# Patient Record
Sex: Female | Born: 1950 | Race: White | Hispanic: No | Smoking: Never smoker
Health system: Southern US, Community
[De-identification: ages and names within clinical notes are randomized; demographics above are authoritative.]

## PROBLEM LIST (undated history)

## (undated) DIAGNOSIS — Z973 Presence of spectacles and contact lenses: Secondary | ICD-10-CM

## (undated) DIAGNOSIS — B1920 Unspecified viral hepatitis C without hepatic coma: Secondary | ICD-10-CM

## (undated) DIAGNOSIS — R161 Splenomegaly, not elsewhere classified: Secondary | ICD-10-CM

## (undated) DIAGNOSIS — J40 Bronchitis, not specified as acute or chronic: Secondary | ICD-10-CM

## (undated) DIAGNOSIS — R202 Paresthesia of skin: Secondary | ICD-10-CM

## (undated) DIAGNOSIS — I10 Essential (primary) hypertension: Secondary | ICD-10-CM

## (undated) DIAGNOSIS — I493 Ventricular premature depolarization: Secondary | ICD-10-CM

## (undated) DIAGNOSIS — R200 Anesthesia of skin: Secondary | ICD-10-CM

## (undated) DIAGNOSIS — G4700 Insomnia, unspecified: Secondary | ICD-10-CM

## (undated) DIAGNOSIS — G43909 Migraine, unspecified, not intractable, without status migrainosus: Secondary | ICD-10-CM

## (undated) DIAGNOSIS — G4730 Sleep apnea, unspecified: Secondary | ICD-10-CM

## (undated) DIAGNOSIS — K219 Gastro-esophageal reflux disease without esophagitis: Secondary | ICD-10-CM

## (undated) DIAGNOSIS — F329 Major depressive disorder, single episode, unspecified: Secondary | ICD-10-CM

## (undated) DIAGNOSIS — J45909 Unspecified asthma, uncomplicated: Secondary | ICD-10-CM

## (undated) DIAGNOSIS — F32A Depression, unspecified: Secondary | ICD-10-CM

## (undated) DIAGNOSIS — Z8701 Personal history of pneumonia (recurrent): Secondary | ICD-10-CM

## (undated) DIAGNOSIS — IMO0001 Reserved for inherently not codable concepts without codable children: Secondary | ICD-10-CM

## (undated) DIAGNOSIS — K7581 Nonalcoholic steatohepatitis (NASH): Secondary | ICD-10-CM

## (undated) DIAGNOSIS — E079 Disorder of thyroid, unspecified: Secondary | ICD-10-CM

## (undated) DIAGNOSIS — F419 Anxiety disorder, unspecified: Secondary | ICD-10-CM

## (undated) DIAGNOSIS — M1990 Unspecified osteoarthritis, unspecified site: Secondary | ICD-10-CM

## (undated) DIAGNOSIS — K5792 Diverticulitis of intestine, part unspecified, without perforation or abscess without bleeding: Secondary | ICD-10-CM

## (undated) DIAGNOSIS — J449 Chronic obstructive pulmonary disease, unspecified: Secondary | ICD-10-CM

## (undated) HISTORY — PX: KNEE SURGERY: SHX244

## (undated) HISTORY — PX: ANKLE SURGERY: SHX546

## (undated) HISTORY — DX: Migraine, unspecified, not intractable, without status migrainosus: G43.909

## (undated) HISTORY — PX: COLONOSCOPY: SHX174

## (undated) HISTORY — PX: COLON SURGERY: SHX602

## (undated) HISTORY — DX: Essential (primary) hypertension: I10

## (undated) HISTORY — DX: Unspecified osteoarthritis, unspecified site: M19.90

## (undated) HISTORY — PX: ABDOMINAL HYSTERECTOMY: SHX81

## (undated) HISTORY — DX: Depression, unspecified: F32.A

## (undated) HISTORY — DX: Nonalcoholic steatohepatitis (NASH): K75.81

## (undated) HISTORY — DX: Gastro-esophageal reflux disease without esophagitis: K21.9

## (undated) HISTORY — DX: Disorder of thyroid, unspecified: E07.9

## (undated) HISTORY — DX: Anxiety disorder, unspecified: F41.9

## (undated) HISTORY — DX: Chronic obstructive pulmonary disease, unspecified: J44.9

## (undated) HISTORY — DX: Unspecified asthma, uncomplicated: J45.909

## (undated) HISTORY — PX: ESOPHAGOGASTRODUODENOSCOPY: SHX1529

## (undated) HISTORY — DX: Major depressive disorder, single episode, unspecified: F32.9

## (undated) HISTORY — DX: Ventricular premature depolarization: I49.3

## (undated) HISTORY — DX: Unspecified viral hepatitis C without hepatic coma: B19.20

## (undated) HISTORY — DX: Diverticulitis of intestine, part unspecified, without perforation or abscess without bleeding: K57.92

## (undated) HISTORY — DX: Splenomegaly, not elsewhere classified: R16.1

## (undated) HISTORY — PX: JOINT REPLACEMENT: SHX530

---

## 2003-08-24 ENCOUNTER — Ambulatory Visit (HOSPITAL_COMMUNITY): Admission: RE | Admit: 2003-08-24 | Discharge: 2003-08-24 | Payer: Self-pay | Admitting: Family Medicine

## 2003-08-28 ENCOUNTER — Encounter: Admission: RE | Admit: 2003-08-28 | Discharge: 2003-08-28 | Payer: Self-pay | Admitting: Oncology

## 2003-08-28 ENCOUNTER — Encounter (HOSPITAL_COMMUNITY): Admission: RE | Admit: 2003-08-28 | Discharge: 2003-09-27 | Payer: Self-pay | Admitting: Oncology

## 2003-08-29 ENCOUNTER — Encounter (HOSPITAL_COMMUNITY): Payer: Self-pay | Admitting: Oncology

## 2003-10-03 ENCOUNTER — Encounter (HOSPITAL_COMMUNITY): Admission: RE | Admit: 2003-10-03 | Discharge: 2003-11-02 | Payer: Self-pay | Admitting: Oncology

## 2003-10-03 ENCOUNTER — Encounter: Admission: RE | Admit: 2003-10-03 | Discharge: 2003-10-03 | Payer: Self-pay | Admitting: Oncology

## 2003-11-07 ENCOUNTER — Encounter (HOSPITAL_COMMUNITY): Admission: RE | Admit: 2003-11-07 | Discharge: 2003-12-07 | Payer: Self-pay | Admitting: Oncology

## 2003-11-07 ENCOUNTER — Encounter: Admission: RE | Admit: 2003-11-07 | Discharge: 2003-11-07 | Payer: Self-pay | Admitting: Oncology

## 2003-12-12 ENCOUNTER — Encounter (HOSPITAL_COMMUNITY): Admission: RE | Admit: 2003-12-12 | Discharge: 2003-12-15 | Payer: Self-pay | Admitting: Oncology

## 2003-12-12 ENCOUNTER — Encounter: Admission: RE | Admit: 2003-12-12 | Discharge: 2003-12-15 | Payer: Self-pay | Admitting: Oncology

## 2003-12-18 ENCOUNTER — Encounter (INDEPENDENT_AMBULATORY_CARE_PROVIDER_SITE_OTHER): Payer: Self-pay | Admitting: *Deleted

## 2003-12-18 ENCOUNTER — Ambulatory Visit (HOSPITAL_COMMUNITY): Admission: RE | Admit: 2003-12-18 | Discharge: 2003-12-18 | Payer: Self-pay | Admitting: Internal Medicine

## 2003-12-19 ENCOUNTER — Encounter: Admission: RE | Admit: 2003-12-19 | Discharge: 2003-12-19 | Payer: Self-pay | Admitting: Oncology

## 2003-12-19 ENCOUNTER — Encounter (HOSPITAL_COMMUNITY): Admission: RE | Admit: 2003-12-19 | Discharge: 2004-01-18 | Payer: Self-pay | Admitting: Oncology

## 2003-12-26 ENCOUNTER — Emergency Department (HOSPITAL_COMMUNITY): Admission: EM | Admit: 2003-12-26 | Discharge: 2003-12-26 | Payer: Self-pay | Admitting: Emergency Medicine

## 2004-01-13 ENCOUNTER — Emergency Department (HOSPITAL_COMMUNITY): Admission: EM | Admit: 2004-01-13 | Discharge: 2004-01-13 | Payer: Self-pay | Admitting: Emergency Medicine

## 2004-01-17 ENCOUNTER — Ambulatory Visit: Payer: Self-pay | Admitting: Internal Medicine

## 2004-01-23 ENCOUNTER — Ambulatory Visit (HOSPITAL_COMMUNITY): Payer: Self-pay | Admitting: Oncology

## 2004-01-23 ENCOUNTER — Encounter: Admission: RE | Admit: 2004-01-23 | Discharge: 2004-01-23 | Payer: Self-pay | Admitting: Oncology

## 2004-01-23 ENCOUNTER — Encounter (HOSPITAL_COMMUNITY): Admission: RE | Admit: 2004-01-23 | Discharge: 2004-02-22 | Payer: Self-pay | Admitting: Oncology

## 2004-02-13 ENCOUNTER — Ambulatory Visit: Payer: Self-pay | Admitting: Gastroenterology

## 2004-03-04 ENCOUNTER — Encounter: Admission: RE | Admit: 2004-03-04 | Discharge: 2004-03-04 | Payer: Self-pay | Admitting: Oncology

## 2004-03-04 ENCOUNTER — Encounter (HOSPITAL_COMMUNITY): Admission: RE | Admit: 2004-03-04 | Discharge: 2004-04-03 | Payer: Self-pay | Admitting: Oncology

## 2004-03-05 ENCOUNTER — Ambulatory Visit: Payer: Self-pay | Admitting: Gastroenterology

## 2004-06-10 ENCOUNTER — Ambulatory Visit: Payer: Self-pay | Admitting: Gastroenterology

## 2004-06-14 ENCOUNTER — Ambulatory Visit (HOSPITAL_COMMUNITY): Payer: Self-pay | Admitting: Oncology

## 2004-06-14 ENCOUNTER — Encounter: Admission: RE | Admit: 2004-06-14 | Discharge: 2004-06-14 | Payer: Self-pay | Admitting: Oncology

## 2004-06-14 ENCOUNTER — Encounter (HOSPITAL_COMMUNITY): Admission: RE | Admit: 2004-06-14 | Discharge: 2004-07-14 | Payer: Self-pay | Admitting: Oncology

## 2004-07-04 ENCOUNTER — Ambulatory Visit: Payer: Self-pay | Admitting: Gastroenterology

## 2004-07-18 ENCOUNTER — Ambulatory Visit: Payer: Self-pay | Admitting: Internal Medicine

## 2004-08-01 ENCOUNTER — Ambulatory Visit: Payer: Self-pay | Admitting: Internal Medicine

## 2004-08-12 ENCOUNTER — Ambulatory Visit (HOSPITAL_COMMUNITY): Payer: Self-pay | Admitting: Oncology

## 2004-08-12 ENCOUNTER — Encounter (HOSPITAL_COMMUNITY): Admission: RE | Admit: 2004-08-12 | Discharge: 2004-09-11 | Payer: Self-pay | Admitting: Oncology

## 2004-08-12 ENCOUNTER — Encounter: Admission: RE | Admit: 2004-08-12 | Discharge: 2004-08-12 | Payer: Self-pay | Admitting: Oncology

## 2004-08-29 ENCOUNTER — Ambulatory Visit: Payer: Self-pay | Admitting: Internal Medicine

## 2004-09-11 ENCOUNTER — Ambulatory Visit: Payer: Self-pay | Admitting: Cardiology

## 2004-09-26 ENCOUNTER — Ambulatory Visit: Payer: Self-pay | Admitting: Internal Medicine

## 2004-09-26 ENCOUNTER — Encounter: Admission: RE | Admit: 2004-09-26 | Discharge: 2004-09-26 | Payer: Self-pay | Admitting: Internal Medicine

## 2004-10-08 ENCOUNTER — Ambulatory Visit: Payer: Self-pay | Admitting: Internal Medicine

## 2004-10-08 ENCOUNTER — Inpatient Hospital Stay (HOSPITAL_COMMUNITY): Admission: AD | Admit: 2004-10-08 | Discharge: 2004-10-10 | Payer: Self-pay | Admitting: Internal Medicine

## 2004-10-09 ENCOUNTER — Ambulatory Visit: Payer: Self-pay | Admitting: Cardiovascular Disease

## 2004-10-09 ENCOUNTER — Encounter: Payer: Self-pay | Admitting: Cardiovascular Disease

## 2004-10-17 ENCOUNTER — Ambulatory Visit: Payer: Self-pay | Admitting: Internal Medicine

## 2004-10-21 ENCOUNTER — Ambulatory Visit: Payer: Self-pay | Admitting: Internal Medicine

## 2004-10-22 ENCOUNTER — Ambulatory Visit: Payer: Self-pay | Admitting: Internal Medicine

## 2004-10-22 ENCOUNTER — Ambulatory Visit (HOSPITAL_COMMUNITY): Admission: RE | Admit: 2004-10-22 | Discharge: 2004-10-22 | Payer: Self-pay | Admitting: Internal Medicine

## 2004-10-29 ENCOUNTER — Ambulatory Visit: Payer: Self-pay | Admitting: Internal Medicine

## 2004-10-29 ENCOUNTER — Inpatient Hospital Stay (HOSPITAL_COMMUNITY): Admission: EM | Admit: 2004-10-29 | Discharge: 2004-11-01 | Payer: Self-pay | Admitting: Emergency Medicine

## 2004-11-12 ENCOUNTER — Ambulatory Visit (HOSPITAL_COMMUNITY): Admission: RE | Admit: 2004-11-12 | Discharge: 2004-11-12 | Payer: Self-pay | Admitting: Family Medicine

## 2004-11-14 ENCOUNTER — Ambulatory Visit: Payer: Self-pay | Admitting: Internal Medicine

## 2004-11-28 ENCOUNTER — Ambulatory Visit: Payer: Self-pay | Admitting: Gastroenterology

## 2004-12-10 ENCOUNTER — Ambulatory Visit: Payer: Self-pay | Admitting: Internal Medicine

## 2004-12-19 ENCOUNTER — Ambulatory Visit: Payer: Self-pay | Admitting: Internal Medicine

## 2004-12-19 ENCOUNTER — Emergency Department (HOSPITAL_COMMUNITY): Admission: EM | Admit: 2004-12-19 | Discharge: 2004-12-19 | Payer: Self-pay | Admitting: Emergency Medicine

## 2004-12-26 ENCOUNTER — Ambulatory Visit: Payer: Self-pay | Admitting: Internal Medicine

## 2005-01-24 ENCOUNTER — Ambulatory Visit: Payer: Self-pay | Admitting: Internal Medicine

## 2005-02-03 ENCOUNTER — Ambulatory Visit: Payer: Self-pay | Admitting: Internal Medicine

## 2005-02-07 ENCOUNTER — Ambulatory Visit (HOSPITAL_COMMUNITY): Admission: RE | Admit: 2005-02-07 | Discharge: 2005-02-07 | Payer: Self-pay | Admitting: Internal Medicine

## 2005-02-10 ENCOUNTER — Ambulatory Visit (HOSPITAL_COMMUNITY): Payer: Self-pay | Admitting: Oncology

## 2005-02-10 ENCOUNTER — Encounter (HOSPITAL_COMMUNITY): Admission: RE | Admit: 2005-02-10 | Discharge: 2005-03-12 | Payer: Self-pay | Admitting: Oncology

## 2005-02-10 ENCOUNTER — Encounter: Admission: RE | Admit: 2005-02-10 | Discharge: 2005-02-10 | Payer: Self-pay | Admitting: Oncology

## 2005-02-11 ENCOUNTER — Ambulatory Visit: Payer: Self-pay | Admitting: Internal Medicine

## 2005-03-26 ENCOUNTER — Ambulatory Visit: Payer: Self-pay | Admitting: Internal Medicine

## 2005-04-07 ENCOUNTER — Ambulatory Visit: Payer: Self-pay | Admitting: Internal Medicine

## 2005-05-14 ENCOUNTER — Ambulatory Visit: Payer: Self-pay | Admitting: Internal Medicine

## 2005-05-20 ENCOUNTER — Ambulatory Visit (HOSPITAL_COMMUNITY): Admission: RE | Admit: 2005-05-20 | Discharge: 2005-05-20 | Payer: Self-pay | Admitting: Internal Medicine

## 2005-06-13 ENCOUNTER — Ambulatory Visit: Payer: Self-pay | Admitting: Internal Medicine

## 2005-06-25 ENCOUNTER — Ambulatory Visit: Payer: Self-pay | Admitting: Internal Medicine

## 2005-07-04 ENCOUNTER — Ambulatory Visit (HOSPITAL_COMMUNITY): Admission: RE | Admit: 2005-07-04 | Discharge: 2005-07-04 | Payer: Self-pay | Admitting: Family Medicine

## 2005-08-21 ENCOUNTER — Ambulatory Visit: Payer: Self-pay | Admitting: Gastroenterology

## 2005-08-27 ENCOUNTER — Ambulatory Visit: Payer: Self-pay | Admitting: Internal Medicine

## 2005-10-22 ENCOUNTER — Inpatient Hospital Stay (HOSPITAL_COMMUNITY): Admission: RE | Admit: 2005-10-22 | Discharge: 2005-10-25 | Payer: Self-pay | Admitting: Orthopedic Surgery

## 2005-10-23 ENCOUNTER — Ambulatory Visit: Payer: Self-pay | Admitting: Physical Medicine & Rehabilitation

## 2005-11-08 ENCOUNTER — Inpatient Hospital Stay (HOSPITAL_COMMUNITY): Admission: EM | Admit: 2005-11-08 | Discharge: 2005-11-12 | Payer: Self-pay | Admitting: Emergency Medicine

## 2005-11-09 ENCOUNTER — Ambulatory Visit: Payer: Self-pay | Admitting: Infectious Diseases

## 2005-12-03 ENCOUNTER — Ambulatory Visit: Payer: Self-pay | Admitting: Hospitalist

## 2006-01-13 ENCOUNTER — Ambulatory Visit (HOSPITAL_COMMUNITY): Admission: RE | Admit: 2006-01-13 | Discharge: 2006-01-13 | Payer: Self-pay | Admitting: Internal Medicine

## 2006-01-13 ENCOUNTER — Inpatient Hospital Stay (HOSPITAL_COMMUNITY): Admission: AD | Admit: 2006-01-13 | Discharge: 2006-01-15 | Payer: Self-pay | Admitting: Hospitalist

## 2006-01-13 ENCOUNTER — Ambulatory Visit: Payer: Self-pay | Admitting: Internal Medicine

## 2006-01-13 ENCOUNTER — Ambulatory Visit: Payer: Self-pay | Admitting: Hospitalist

## 2006-01-28 ENCOUNTER — Ambulatory Visit: Payer: Self-pay | Admitting: Hospitalist

## 2006-02-19 ENCOUNTER — Ambulatory Visit: Payer: Self-pay | Admitting: Internal Medicine

## 2006-02-19 ENCOUNTER — Encounter: Payer: Self-pay | Admitting: Vascular Surgery

## 2006-02-19 ENCOUNTER — Encounter (INDEPENDENT_AMBULATORY_CARE_PROVIDER_SITE_OTHER): Payer: Self-pay | Admitting: Internal Medicine

## 2006-02-19 ENCOUNTER — Ambulatory Visit (HOSPITAL_COMMUNITY): Admission: RE | Admit: 2006-02-19 | Discharge: 2006-02-19 | Payer: Self-pay | Admitting: Internal Medicine

## 2006-02-19 LAB — CONVERTED CEMR LAB
Basophils Relative: 0 % (ref 0–1)
Lymphocytes Relative: 27 % (ref 15–43)
MCV: 90.3 fL (ref 78.8–100.0)
Monocytes Absolute: 0.4 10*3/uL (ref 0.2–0.7)
Neutrophils Relative %: 62 % (ref 47–77)
WBC: 4.8 10*3/uL (ref 3.7–10.0)

## 2006-03-18 ENCOUNTER — Ambulatory Visit (HOSPITAL_BASED_OUTPATIENT_CLINIC_OR_DEPARTMENT_OTHER): Admission: RE | Admit: 2006-03-18 | Discharge: 2006-03-18 | Payer: Self-pay | Admitting: Orthopedic Surgery

## 2006-03-24 ENCOUNTER — Ambulatory Visit: Payer: Self-pay | Admitting: Internal Medicine

## 2006-03-24 ENCOUNTER — Ambulatory Visit: Payer: Self-pay | Admitting: *Deleted

## 2006-03-24 ENCOUNTER — Inpatient Hospital Stay (HOSPITAL_COMMUNITY): Admission: AD | Admit: 2006-03-24 | Discharge: 2006-04-01 | Payer: Self-pay | Admitting: *Deleted

## 2006-03-24 DIAGNOSIS — M8610 Other acute osteomyelitis, unspecified site: Secondary | ICD-10-CM | POA: Insufficient documentation

## 2006-03-25 ENCOUNTER — Encounter: Payer: Self-pay | Admitting: Vascular Surgery

## 2006-04-08 DIAGNOSIS — F329 Major depressive disorder, single episode, unspecified: Secondary | ICD-10-CM

## 2006-04-08 DIAGNOSIS — D649 Anemia, unspecified: Secondary | ICD-10-CM | POA: Insufficient documentation

## 2006-04-08 DIAGNOSIS — B182 Chronic viral hepatitis C: Secondary | ICD-10-CM | POA: Insufficient documentation

## 2006-04-08 DIAGNOSIS — I10 Essential (primary) hypertension: Secondary | ICD-10-CM | POA: Insufficient documentation

## 2006-04-08 DIAGNOSIS — J45909 Unspecified asthma, uncomplicated: Secondary | ICD-10-CM | POA: Insufficient documentation

## 2006-04-08 DIAGNOSIS — M1710 Unilateral primary osteoarthritis, unspecified knee: Secondary | ICD-10-CM

## 2006-04-08 DIAGNOSIS — K219 Gastro-esophageal reflux disease without esophagitis: Secondary | ICD-10-CM

## 2006-04-08 DIAGNOSIS — D693 Immune thrombocytopenic purpura: Secondary | ICD-10-CM

## 2006-04-08 DIAGNOSIS — F3289 Other specified depressive episodes: Secondary | ICD-10-CM | POA: Insufficient documentation

## 2006-04-08 DIAGNOSIS — F411 Generalized anxiety disorder: Secondary | ICD-10-CM | POA: Insufficient documentation

## 2006-04-09 ENCOUNTER — Encounter (INDEPENDENT_AMBULATORY_CARE_PROVIDER_SITE_OTHER): Payer: Self-pay | Admitting: Internal Medicine

## 2006-04-09 ENCOUNTER — Ambulatory Visit: Payer: Self-pay | Admitting: Internal Medicine

## 2006-04-09 DIAGNOSIS — R740 Nonspecific elevation of levels of transaminase and lactic acid dehydrogenase [LDH]: Secondary | ICD-10-CM

## 2006-04-09 DIAGNOSIS — Z96659 Presence of unspecified artificial knee joint: Secondary | ICD-10-CM

## 2006-04-09 DIAGNOSIS — N189 Chronic kidney disease, unspecified: Secondary | ICD-10-CM | POA: Insufficient documentation

## 2006-04-09 LAB — CONVERTED CEMR LAB
ALT: 68 units/L — ABNORMAL HIGH (ref 0–35)
AST: 131 units/L — ABNORMAL HIGH (ref 0–37)
Alkaline Phosphatase: 133 units/L — ABNORMAL HIGH (ref 39–117)
BUN: 10 mg/dL (ref 6–23)
Basophils Absolute: 0 10*3/uL (ref 0.0–0.1)
Basophils Relative: 0 % (ref 0–1)
CO2: 26 meq/L (ref 19–32)
Calcium: 9.7 mg/dL (ref 8.4–10.5)
Chloride: 102 meq/L (ref 96–112)
Eosinophils Absolute: 0.2 10*3/uL (ref 0.0–0.7)
Glucose, Bld: 98 mg/dL (ref 70–99)
Hemoglobin: 11.7 g/dL — ABNORMAL LOW (ref 12.0–15.0)
Lymphocytes Relative: 21 % (ref 12–46)
Lymphs Abs: 1.4 10*3/uL (ref 0.7–3.3)
MCHC: 33.4 g/dL (ref 30.0–36.0)
MCV: 88.7 fL (ref 78.0–100.0)
Neutrophils Relative %: 68 % (ref 43–77)
Potassium: 4.9 meq/L (ref 3.5–5.3)
Sodium: 135 meq/L (ref 135–145)
Total Protein: 7.6 g/dL (ref 6.0–8.3)
WBC: 6.7 10*3/uL (ref 4.0–10.5)

## 2006-04-16 ENCOUNTER — Ambulatory Visit: Payer: Self-pay | Admitting: Internal Medicine

## 2006-04-16 ENCOUNTER — Encounter (INDEPENDENT_AMBULATORY_CARE_PROVIDER_SITE_OTHER): Payer: Self-pay | Admitting: Internal Medicine

## 2006-04-16 DIAGNOSIS — L2089 Other atopic dermatitis: Secondary | ICD-10-CM

## 2006-04-16 DIAGNOSIS — G4700 Insomnia, unspecified: Secondary | ICD-10-CM

## 2006-04-17 ENCOUNTER — Telehealth (INDEPENDENT_AMBULATORY_CARE_PROVIDER_SITE_OTHER): Payer: Self-pay | Admitting: Internal Medicine

## 2006-04-17 LAB — CONVERTED CEMR LAB
ALT: 49 U/L — ABNORMAL HIGH
AST: 68 U/L — ABNORMAL HIGH
Albumin: 3.8 g/dL
Alkaline Phosphatase: 116 U/L
BUN: 11 mg/dL
CO2: 20 meq/L
Calcium: 9.1 mg/dL
Chloride: 104 meq/L
Creatinine, Ser: 1.24 mg/dL — ABNORMAL HIGH
Glucose, Bld: 97 mg/dL
Magnesium: 1.9 mg/dL
Potassium: 3.8 meq/L
Sodium: 137 meq/L
Total Bilirubin: 0.5 mg/dL
Total Protein: 7.1 g/dL

## 2006-04-23 ENCOUNTER — Ambulatory Visit: Payer: Self-pay | Admitting: Internal Medicine

## 2006-04-28 ENCOUNTER — Encounter (INDEPENDENT_AMBULATORY_CARE_PROVIDER_SITE_OTHER): Payer: Self-pay | Admitting: Internal Medicine

## 2006-05-01 ENCOUNTER — Telehealth (INDEPENDENT_AMBULATORY_CARE_PROVIDER_SITE_OTHER): Payer: Self-pay | Admitting: Hospitalist

## 2006-05-04 ENCOUNTER — Telehealth (INDEPENDENT_AMBULATORY_CARE_PROVIDER_SITE_OTHER): Payer: Self-pay | Admitting: *Deleted

## 2006-05-05 ENCOUNTER — Telehealth (INDEPENDENT_AMBULATORY_CARE_PROVIDER_SITE_OTHER): Payer: Self-pay | Admitting: Hospitalist

## 2006-05-06 ENCOUNTER — Inpatient Hospital Stay (HOSPITAL_COMMUNITY): Admission: RE | Admit: 2006-05-06 | Discharge: 2006-05-09 | Payer: Self-pay | Admitting: Orthopedic Surgery

## 2006-05-07 ENCOUNTER — Encounter (INDEPENDENT_AMBULATORY_CARE_PROVIDER_SITE_OTHER): Payer: Self-pay | Admitting: Internal Medicine

## 2006-05-17 ENCOUNTER — Emergency Department (HOSPITAL_COMMUNITY): Admission: EM | Admit: 2006-05-17 | Discharge: 2006-05-17 | Payer: Self-pay | Admitting: Emergency Medicine

## 2006-06-24 ENCOUNTER — Encounter (INDEPENDENT_AMBULATORY_CARE_PROVIDER_SITE_OTHER): Payer: Self-pay | Admitting: Internal Medicine

## 2006-06-24 ENCOUNTER — Ambulatory Visit: Payer: Self-pay | Admitting: Internal Medicine

## 2006-06-25 ENCOUNTER — Telehealth: Payer: Self-pay | Admitting: *Deleted

## 2006-06-29 LAB — CONVERTED CEMR LAB
BUN: 26 mg/dL — ABNORMAL HIGH (ref 6–23)
Chloride: 106 meq/L (ref 96–112)
HCT: 36.1 % (ref 36.0–46.0)
MCHC: 32.7 g/dL (ref 30.0–36.0)
Platelets: 144 10*3/uL — ABNORMAL LOW (ref 150–400)
Sodium: 142 meq/L (ref 135–145)

## 2006-07-06 ENCOUNTER — Ambulatory Visit: Payer: Self-pay | Admitting: Hospitalist

## 2006-07-16 ENCOUNTER — Ambulatory Visit: Payer: Self-pay | Admitting: Internal Medicine

## 2006-08-20 ENCOUNTER — Telehealth (INDEPENDENT_AMBULATORY_CARE_PROVIDER_SITE_OTHER): Payer: Self-pay | Admitting: *Deleted

## 2006-08-20 ENCOUNTER — Encounter (INDEPENDENT_AMBULATORY_CARE_PROVIDER_SITE_OTHER): Payer: Self-pay | Admitting: Unknown Physician Specialty

## 2006-08-20 ENCOUNTER — Ambulatory Visit: Payer: Self-pay | Admitting: Internal Medicine

## 2006-08-20 DIAGNOSIS — J209 Acute bronchitis, unspecified: Secondary | ICD-10-CM | POA: Insufficient documentation

## 2006-08-24 ENCOUNTER — Ambulatory Visit: Payer: Self-pay | Admitting: Internal Medicine

## 2006-09-03 ENCOUNTER — Encounter: Payer: Self-pay | Admitting: Internal Medicine

## 2006-09-03 ENCOUNTER — Ambulatory Visit (HOSPITAL_COMMUNITY): Admission: RE | Admit: 2006-09-03 | Discharge: 2006-09-03 | Payer: Self-pay | Admitting: Internal Medicine

## 2006-09-03 ENCOUNTER — Encounter (INDEPENDENT_AMBULATORY_CARE_PROVIDER_SITE_OTHER): Payer: Self-pay | Admitting: Internal Medicine

## 2006-09-03 ENCOUNTER — Ambulatory Visit: Payer: Self-pay | Admitting: Internal Medicine

## 2006-11-02 ENCOUNTER — Ambulatory Visit: Payer: Self-pay | Admitting: *Deleted

## 2006-11-03 ENCOUNTER — Encounter (INDEPENDENT_AMBULATORY_CARE_PROVIDER_SITE_OTHER): Payer: Self-pay | Admitting: Internal Medicine

## 2006-11-04 LAB — CONVERTED CEMR LAB
AST: 52 units/L — ABNORMAL HIGH (ref 0–37)
CO2: 23 meq/L (ref 19–32)
Chloride: 106 meq/L (ref 96–112)
Creatinine, Ser: 0.89 mg/dL (ref 0.40–1.20)
Eosinophils Absolute: 0.1 10*3/uL (ref 0.0–0.7)
Lymphocytes Relative: 29 % (ref 12–46)
MCHC: 34.4 g/dL (ref 30.0–36.0)
Monocytes Relative: 9 % (ref 3–11)
Neutro Abs: 3.5 10*3/uL (ref 1.7–7.7)
Neutrophils Relative %: 59 % (ref 43–77)
Platelets: 134 10*3/uL — ABNORMAL LOW (ref 150–400)
Potassium: 4.2 meq/L (ref 3.5–5.3)
RBC: 3.97 M/uL (ref 3.87–5.11)
RDW: 14.6 % — ABNORMAL HIGH (ref 11.5–14.0)
Sodium: 141 meq/L (ref 135–145)
TSH: 1.782 microintl units/mL (ref 0.350–5.50)
Total Protein: 7.8 g/dL (ref 6.0–8.3)
WBC: 5.8 10*3/uL (ref 4.0–10.5)

## 2007-01-18 ENCOUNTER — Ambulatory Visit: Payer: Self-pay | Admitting: Hospitalist

## 2007-01-18 DIAGNOSIS — M129 Arthropathy, unspecified: Secondary | ICD-10-CM | POA: Insufficient documentation

## 2007-02-02 ENCOUNTER — Ambulatory Visit: Payer: Self-pay | Admitting: *Deleted

## 2007-02-25 ENCOUNTER — Ambulatory Visit (HOSPITAL_COMMUNITY): Admission: RE | Admit: 2007-02-25 | Discharge: 2007-02-25 | Payer: Self-pay | Admitting: Infectious Diseases

## 2007-02-25 ENCOUNTER — Ambulatory Visit: Payer: Self-pay | Admitting: Infectious Diseases

## 2007-02-25 ENCOUNTER — Encounter (INDEPENDENT_AMBULATORY_CARE_PROVIDER_SITE_OTHER): Payer: Self-pay | Admitting: Internal Medicine

## 2007-02-25 DIAGNOSIS — R079 Chest pain, unspecified: Secondary | ICD-10-CM | POA: Insufficient documentation

## 2007-02-25 DIAGNOSIS — M25569 Pain in unspecified knee: Secondary | ICD-10-CM | POA: Insufficient documentation

## 2007-02-26 LAB — CONVERTED CEMR LAB
Creatinine,U: 168.9 mg/dL
Marijuana Metabolite: NEGATIVE
Methadone: NEGATIVE

## 2007-04-02 ENCOUNTER — Ambulatory Visit: Payer: Self-pay | Admitting: Internal Medicine

## 2007-04-02 ENCOUNTER — Encounter (INDEPENDENT_AMBULATORY_CARE_PROVIDER_SITE_OTHER): Payer: Self-pay | Admitting: Internal Medicine

## 2007-04-02 LAB — CONVERTED CEMR LAB
Cocaine Metabolites: NEGATIVE
Creatinine,U: 190.2 mg/dL
Methadone: NEGATIVE
Propoxyphene: NEGATIVE

## 2007-04-09 ENCOUNTER — Encounter (INDEPENDENT_AMBULATORY_CARE_PROVIDER_SITE_OTHER): Payer: Self-pay | Admitting: Internal Medicine

## 2007-04-14 ENCOUNTER — Telehealth (INDEPENDENT_AMBULATORY_CARE_PROVIDER_SITE_OTHER): Payer: Self-pay | Admitting: Internal Medicine

## 2007-04-26 ENCOUNTER — Ambulatory Visit: Payer: Self-pay | Admitting: Internal Medicine

## 2007-04-26 DIAGNOSIS — S82899A Other fracture of unspecified lower leg, initial encounter for closed fracture: Secondary | ICD-10-CM | POA: Insufficient documentation

## 2007-05-26 ENCOUNTER — Ambulatory Visit: Payer: Self-pay | Admitting: Hospitalist

## 2007-05-26 ENCOUNTER — Encounter (INDEPENDENT_AMBULATORY_CARE_PROVIDER_SITE_OTHER): Payer: Self-pay | Admitting: Internal Medicine

## 2007-05-26 ENCOUNTER — Ambulatory Visit (HOSPITAL_COMMUNITY): Admission: RE | Admit: 2007-05-26 | Discharge: 2007-05-26 | Payer: Self-pay | Admitting: Internal Medicine

## 2007-05-26 DIAGNOSIS — J069 Acute upper respiratory infection, unspecified: Secondary | ICD-10-CM | POA: Insufficient documentation

## 2007-05-26 DIAGNOSIS — K120 Recurrent oral aphthae: Secondary | ICD-10-CM

## 2007-05-26 LAB — CONVERTED CEMR LAB
Basophils Relative: 0 % (ref 0–1)
Eosinophils Absolute: 0.1 10*3/uL (ref 0.0–0.7)
Lymphs Abs: 1.3 10*3/uL (ref 0.7–4.0)
MCHC: 34.2 g/dL (ref 30.0–36.0)
MCV: 96.5 fL (ref 78.0–100.0)
Monocytes Absolute: 0.7 10*3/uL (ref 0.1–1.0)
Monocytes Relative: 10 % (ref 3–12)
Neutro Abs: 4.5 10*3/uL (ref 1.7–7.7)
Platelets: 138 10*3/uL — ABNORMAL LOW (ref 150–400)
RBC: 4.52 M/uL (ref 3.87–5.11)
WBC: 6.6 10*3/uL (ref 4.0–10.5)

## 2007-06-21 ENCOUNTER — Ambulatory Visit: Payer: Self-pay | Admitting: Infectious Disease

## 2007-06-21 ENCOUNTER — Encounter (INDEPENDENT_AMBULATORY_CARE_PROVIDER_SITE_OTHER): Payer: Self-pay | Admitting: Internal Medicine

## 2007-06-26 LAB — CONVERTED CEMR LAB
Benzodiazepines.: POSITIVE — AB
Cocaine Metabolites: NEGATIVE
Opiates: NEGATIVE
Phencyclidine (PCP): NEGATIVE

## 2007-09-24 ENCOUNTER — Encounter (INDEPENDENT_AMBULATORY_CARE_PROVIDER_SITE_OTHER): Payer: Self-pay | Admitting: Internal Medicine

## 2007-09-24 ENCOUNTER — Ambulatory Visit: Payer: Self-pay | Admitting: *Deleted

## 2007-09-30 LAB — CONVERTED CEMR LAB
ALT: 61 units/L — ABNORMAL HIGH (ref 0–35)
AST: 66 units/L — ABNORMAL HIGH (ref 0–37)
Alkaline Phosphatase: 99 units/L (ref 39–117)
Amphetamine Screen, Ur: NEGATIVE
Chloride: 107 meq/L (ref 96–112)
Cocaine Metabolites: NEGATIVE
Creatinine,U: 175.1 mg/dL
Glucose, Bld: 102 mg/dL — ABNORMAL HIGH (ref 70–99)
Phencyclidine (PCP): NEGATIVE
Potassium: 4.2 meq/L (ref 3.5–5.3)
Propoxyphene: NEGATIVE

## 2007-10-28 ENCOUNTER — Ambulatory Visit: Payer: Self-pay | Admitting: *Deleted

## 2007-10-28 ENCOUNTER — Encounter (INDEPENDENT_AMBULATORY_CARE_PROVIDER_SITE_OTHER): Payer: Self-pay | Admitting: Internal Medicine

## 2007-10-28 DIAGNOSIS — R635 Abnormal weight gain: Secondary | ICD-10-CM

## 2007-10-29 ENCOUNTER — Encounter (INDEPENDENT_AMBULATORY_CARE_PROVIDER_SITE_OTHER): Payer: Self-pay | Admitting: Internal Medicine

## 2007-10-29 LAB — CONVERTED CEMR LAB
Benzodiazepines.: POSITIVE — AB
Creatinine,U: 55.4 mg/dL
Marijuana Metabolite: NEGATIVE
Methadone: NEGATIVE
TSH: 4.54 microintl units/mL — ABNORMAL HIGH (ref 0.350–4.50)

## 2007-11-01 LAB — CONVERTED CEMR LAB: T3 Uptake Ratio: 27.5 % (ref 22.5–37.0)

## 2007-11-19 ENCOUNTER — Ambulatory Visit (HOSPITAL_COMMUNITY): Admission: RE | Admit: 2007-11-19 | Discharge: 2007-11-19 | Payer: Self-pay | Admitting: Internal Medicine

## 2007-11-23 ENCOUNTER — Telehealth (INDEPENDENT_AMBULATORY_CARE_PROVIDER_SITE_OTHER): Payer: Self-pay | Admitting: Internal Medicine

## 2007-11-25 ENCOUNTER — Encounter (INDEPENDENT_AMBULATORY_CARE_PROVIDER_SITE_OTHER): Payer: Self-pay | Admitting: Internal Medicine

## 2007-12-29 ENCOUNTER — Ambulatory Visit (HOSPITAL_COMMUNITY): Admission: RE | Admit: 2007-12-29 | Discharge: 2007-12-29 | Payer: Self-pay | Admitting: Internal Medicine

## 2007-12-29 ENCOUNTER — Encounter (INDEPENDENT_AMBULATORY_CARE_PROVIDER_SITE_OTHER): Payer: Self-pay | Admitting: Internal Medicine

## 2007-12-29 ENCOUNTER — Ambulatory Visit: Payer: Self-pay | Admitting: Infectious Disease

## 2007-12-30 ENCOUNTER — Encounter (INDEPENDENT_AMBULATORY_CARE_PROVIDER_SITE_OTHER): Payer: Self-pay | Admitting: Internal Medicine

## 2007-12-31 ENCOUNTER — Ambulatory Visit: Payer: Self-pay | Admitting: Cardiology

## 2007-12-31 LAB — CONVERTED CEMR LAB
Alkaline Phosphatase: 96 units/L (ref 39–117)
BUN: 30 mg/dL — ABNORMAL HIGH (ref 6–23)
Basophils Absolute: 0 10*3/uL (ref 0.0–0.1)
CK-MB: 1.5 ng/mL (ref 0.3–4.0)
Cocaine Metabolites: NEGATIVE
Creatinine,U: 76.9 mg/dL
Eosinophils Relative: 2 % (ref 0–5)
Hemoglobin: 12.9 g/dL (ref 12.0–15.0)
Lymphs Abs: 1.8 10*3/uL (ref 0.7–4.0)
MCHC: 33.3 g/dL (ref 30.0–36.0)
Methadone: NEGATIVE
Monocytes Absolute: 0.5 10*3/uL (ref 0.1–1.0)
Opiates: NEGATIVE
Phencyclidine (PCP): NEGATIVE
Platelets: 132 10*3/uL — ABNORMAL LOW (ref 150–400)
RBC: 3.95 M/uL (ref 3.87–5.11)
RDW: 13.5 % (ref 11.5–15.5)
Total Bilirubin: 0.4 mg/dL (ref 0.3–1.2)
Total CK: 84 units/L (ref 7–177)
Total Protein: 7.1 g/dL (ref 6.0–8.3)
Troponin I: 0.02 ng/mL (ref <0.06)
WBC: 6.4 10*3/uL (ref 4.0–10.5)

## 2008-01-06 ENCOUNTER — Encounter: Payer: Self-pay | Admitting: Cardiology

## 2008-01-17 ENCOUNTER — Telehealth (INDEPENDENT_AMBULATORY_CARE_PROVIDER_SITE_OTHER): Payer: Self-pay | Admitting: Internal Medicine

## 2008-01-21 ENCOUNTER — Encounter: Payer: Self-pay | Admitting: Internal Medicine

## 2008-01-24 ENCOUNTER — Telehealth (INDEPENDENT_AMBULATORY_CARE_PROVIDER_SITE_OTHER): Payer: Self-pay | Admitting: Internal Medicine

## 2008-02-23 ENCOUNTER — Telehealth (INDEPENDENT_AMBULATORY_CARE_PROVIDER_SITE_OTHER): Payer: Self-pay | Admitting: Internal Medicine

## 2008-02-28 ENCOUNTER — Encounter: Payer: Self-pay | Admitting: Internal Medicine

## 2008-03-22 ENCOUNTER — Telehealth (INDEPENDENT_AMBULATORY_CARE_PROVIDER_SITE_OTHER): Payer: Self-pay | Admitting: Internal Medicine

## 2008-03-29 ENCOUNTER — Telehealth (INDEPENDENT_AMBULATORY_CARE_PROVIDER_SITE_OTHER): Payer: Self-pay | Admitting: Internal Medicine

## 2008-03-30 ENCOUNTER — Encounter (INDEPENDENT_AMBULATORY_CARE_PROVIDER_SITE_OTHER): Payer: Self-pay | Admitting: Internal Medicine

## 2008-06-08 ENCOUNTER — Telehealth (INDEPENDENT_AMBULATORY_CARE_PROVIDER_SITE_OTHER): Payer: Self-pay | Admitting: Internal Medicine

## 2008-06-12 ENCOUNTER — Encounter (INDEPENDENT_AMBULATORY_CARE_PROVIDER_SITE_OTHER): Payer: Self-pay | Admitting: Internal Medicine

## 2008-06-13 ENCOUNTER — Encounter: Payer: Self-pay | Admitting: Cardiology

## 2008-06-28 ENCOUNTER — Encounter: Payer: Self-pay | Admitting: Cardiology

## 2008-07-05 ENCOUNTER — Encounter: Payer: Self-pay | Admitting: Cardiology

## 2008-07-05 ENCOUNTER — Ambulatory Visit: Payer: Self-pay | Admitting: Cardiology

## 2008-07-06 ENCOUNTER — Encounter: Payer: Self-pay | Admitting: Cardiology

## 2008-07-08 ENCOUNTER — Encounter: Payer: Self-pay | Admitting: Cardiology

## 2008-07-26 ENCOUNTER — Ambulatory Visit: Payer: Self-pay | Admitting: Cardiology

## 2008-11-29 DIAGNOSIS — R0602 Shortness of breath: Secondary | ICD-10-CM

## 2010-04-08 ENCOUNTER — Encounter: Payer: Self-pay | Admitting: Internal Medicine

## 2010-07-17 ENCOUNTER — Ambulatory Visit (HOSPITAL_COMMUNITY): Payer: Self-pay | Admitting: Psychology

## 2010-07-30 NOTE — Assessment & Plan Note (Signed)
[facility]                          [name]   [name], [name]                      MRN:          [id number]  DATE:07/26/2008                            DOB:          09-30-1950    PRIMARY CARE PHYSICIAN:  Jerene Bears, MD   REASON FOR VISIT:  Post-hospital followup.   HISTORY OF PRESENT ILLNESS:  [name] was seen as an inpatient consult  at [facility] back on July 06, 2008.  She presented at  that time with shortness of breath over her baseline dyspnea as well as  atypical chest pain.  She had had some intermittent diarrhea, malaise,  and cough at that time, and the possibility of an upper respiratory  tract infection was entertained.  She ruled out for myocardial  infarction with normal troponin-I levels and also had an echocardiogram  done, which demonstrated normal left ventricular systolic function with  an ejection fraction of 60-65%.  She did have some septal flattening  consistent with right ventricular pressure and volume overload and mild  right ventricular dilatation with overall normal right ventricular  systolic function.  Pulmonary artery systolic pressure was 32 mmHg.  She  was placed on aspirin and discharged home by Dr. Woody Seller to follow up with  [place].  I discussed further outpatient ischemic testing.   [name] has no clear history of obstructive coronary artery disease  based on records.  She was evaluated at [facility] in August  2006 in the setting of premature ventricular complexes and had a normal  Myoview study done at that time.  She has had no interval ischemic  testing.  Her history includes previous substance abuse including  alcohol, tobacco, and cocaine, although she denies any recently.  Her  chest x-ray demonstrated atelectasis with possible infiltrative density  in the retrocardiac area and it is suspected that she has chronic  obstructive pulmonary disease and Dr.  Woody Seller is arranging outpatient  evaluation including pulmonary function test, presently pending.  Ms.  [name] denies any clearly exertional chest pain.  She has had no  hemoptysis or productive cough.  No recent fevers or chills.   ALLERGIES:  No known drug allergies.   PRESENT MEDICATIONS:  1. Labetalol 100 mg p.o. b.i.d.  2. Prilosec 20 mg p.o. daily.  3. Celexa 40 mg p.o. q.a.m.  4. Xanax 1 mg p.o. q.i.d.  5. Advair 115/21 one puff b.i.d.  6. Aspirin 325 mg p.o. daily.  7. Multivitamin 1 p.o. daily.  8. Meloxicam 7.5 mg as needed.   REVIEW OF SYSTEMS:  As outlined above.  Otherwise, reviewed and are  negative.   PHYSICAL EXAMINATION:  VITAL SIGNS:  Blood pressure is 142/90, heart  rate is 83, weight is 186 pounds.  GENERAL:  This is a chronically ill-appearing woman in no acute  distress.  HEENT:  Conjunctiva is normal.  Oropharynx is clear.  Poor dentition.  NECK:  Supple.  No elevated jugular venous pressure.  LUNGS:  Diminished breath sounds.  No active wheezing.  No egophony.  CARDIAC:  Regular rate and rhythm.  Soft basal systolic murmur.  No S3  gallop or pericardial rub.  ABDOMEN:  Obese, nontender.  Liver edge is nonpalpable.  Bowel sounds  are present.  EXTREMITIES:  Some venous stasis.  Trace edema around the ankles,  nonpitting.  SKIN:  Warm and dry.  MUSCULOSKELETAL:  No kyphosis noted.  NEUROPSYCHIATRIC:  The patient is alert and oriented x3.   IMPRESSION AND RECOMMENDATION:  Dyspnea on exertion with recent atypical  chest pain, subsequently resolved.  The patient ruled out for myocardial  infarction during a hospital observation at [facility] back in  late April.  Her baseline electrocardiogram shows nonspecific ST-T wave  changes with occasional premature ventricular complexes.  She has had no  palpitations or recent syncope.  Her last ischemic evaluation was in  2006.  Main cardiac risk factors include family history of premature  coronary disease,  prior history of polysubstance abuse, and  hypertension.  Her lipid status is uncertain at this point.  I will  discuss these issues with her and we will schedule an outpatient  dobutamine Cardiolite for further cardiac risk stratification.  If this  study is low risk, then efforts at risk factor modification should be  undertaken.  She will plan to follow up with Dr. Woody Seller in that case to  continue on aspirin, have reassessment of lipids, and further management  of blood pressure.  It may well be that her shortness of breath is more  pulmonary in etiology and perhaps further pulmonary evaluation may be  necessary.  Further plans to follow.     Satira Sark, MD  Electronically Signed    SGM/MedQ  DD: 07/26/2008  DT: 07/27/2008  Job #: [id number]   cc:   Jerene Bears, MD

## 2010-08-02 NOTE — Op Note (Signed)
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   POSTOPERATIVE DIAGNOSIS:  Effusion right total knee with arthrofibrosis  and extension contracture.   PROCEDURE:  Right knee arthroscopy with debridement and manipulation  under anesthesia.   SURGEON:  Newt Minion, MD   ANESTHESIA:  General.   ESTIMATED BLOOD LOSS:  Minimal.   ANTIBIOTICS:  1 gram of vancomycin.   SPECIMEN:  Cultures obtained x2.   DRAINS:  One medium Hemovac.   DISPOSITION:  To PACU in stable condition.   INDICATIONS FOR PROCEDURE:  The patient is a 60 year old woman who is  status post a right total knee arthroplasty.  She presented to the  office on Monday with acute  effusion and pain.  There was no cellulitis  at that time.  The patient underwent aspiration of the knee.  The fluid  was cloudy and the patient was scheduled for arthroscopic evaluation for  potential septic joint.  The patient was prescribed doxycycline, but did  not obtain this from the pharmacy.  The patient examination prior to  surgery did show some mild redness around the incision, but there was no  cellulitis around the knee.  She still had a persistent effusion.  The  patient is brought at this time for arthroscopic intervention.  The  risks of surgery including persistent infection, need for additional  surgery.  The patient states she understands and wished to proceed at  this time.   DESCRIPTION OF PROCEDURE:  The patient was brought to OR room eight and  underwent a general anesthetic.  After adequate level of anesthesia was  obtained  the patient's right lower extremity was prepped using DuraPrep  and draped into a sterile field.  The scope was inserted through the  inferior lateral portal and inferior medial working portal was  established.  Visualization showed a significant amount of synovitis  within the knee.  Cultures were obtained of the synovial fluid.  The  synovial fluid was clear serosanguineous fluid.  There was no purulence,  no evidence of infection.  A synovectomy was performed and this improved  her extension.  She still lacked about 10 degrees to full extension.  There was no mechanical block within the knee.  All three compartments  were evaluated and there was no purulence.  There was some mild  cellulitis.   A drain was inserted through the superior lateral portal.  We will leave  this in until the patient is discharged from the recovery room.  She was  given a prescription for doxycycline 100 mg p.o. b.i.d.  After  arthroscopic debridement and manipulation under anesthesia the drain was  placed through the superior  lateral portal.  The inferior portals were  closed with 4-0 nylon.  The wounds were covered with Adaptic, orthopedic  sponges, sterile Webril and a Coban dressing.  The patient was  extubated, taken to the PACU in stable condition.  She has a  prescription for Vicodin for pain.  We will place an easy wrap ice wrap.  Continue work on range of motion of her knee and she will start the  doxycycline 100 mg p.o. b.i.d.      Newt Minion, MD  Electronically Signed     MVD/MEDQ  D:  03/18/2006  T:  03/18/2006  Job:  [id number]

## 2010-08-02 NOTE — Discharge Summary (Signed)
NAME:  [name], [name] NO.:  [id number]   MEDICAL RECORD NO.:  [id number]          PATIENT TYPE:  INP   LOCATION:  6606                         FACILITY:  [facility]   PHYSICIAN:  Lucy Chris, MD     DATE OF BIRTH:  11/19/1950   DATE OF ADMISSION:  03/24/2006  DATE OF DISCHARGE:  04/01/2006                               DISCHARGE SUMMARY   DISCHARGE DIAGNOSES:  1. Status post total knee apparatus removal secondary to      osteomyelitis.  2. Anemia.  3. Hypotension.  4. Hypokalemia.  5. PSA.  6. Depression/anxiety.  7. Hepatitis C.  8. Urinary tract infection.   DISCHARGE MEDICATIONS:  1. Celexa 40 mg p.o. daily.  2. Xanax 1 mg take one pill p.o. b.i.d. and two pills at night.  3. Aspirin 81 mg p.o. daily.  4. Labetalol 100 mg p.o. b.i.d.  The patient is not to take labetalol      until her followup appointment with Dr. Jiles Crocker.  5. Multivitamin one pill p.o. daily.  6. Ciprofloxacin 500 mg p.o. b.i.d. for six weeks.  7. Folic acid 1 mg p.o. daily.  8. MS Contin 30 mg p.o. b.i.d. p.r.n. pain.  9. Protonix 40 mg p.o. daily.  10.Thiamine 100 mg p.o. daily.  11.Vancomycin 1 g IV b.i.d.  12.Albuterol MDI one to two puffs q.4 h. P.r.n.  13.Oxycodone 5 mg one pill q.6h. p.r.n. pain.  14.Coumadin 5 mg p.o. daily or as per Dr. Johney Maine' recommendations.  15.K-Dur 40 mEq p.o. daily.  16.Magnesium oxide 400 mg p.o. daily. The patient is to talk with Dr.      Jiles Crocker about whether to continue taking or to stop the K-Dur and      magnesium oxide.   DISPOSITION AND FOLLOWUP:  The patient is to follow up with Dr. Jiles Crocker in  the outpatient clinic on April 09, 2006 at 11 a.m. at which time she  will check a BMET, CBC, PT, INR, as well as a magnesium level.  It  should be addressed whether or not the patient should continue on K-Dur  or magnesium for long term usage.  The patient is also to return to Dr.  Sharol Given, who is the orthopedist, in approximately two weeks.  The  patient  was instructed to call Dr. Jess Barters office and make an appointment.  The  patient is also to return to Dr. Johney Maine in outpatient clinic on Monday,  January 21 at 12 p.m.   OPERATION/PROCEDURE:  1. Total knee apparatus removal was done by Dr. Sharol Given on January 10.  2. Nuclear medicine three-phase bone scan was also done which showed      osteomyelitis.   CONSULTATIONS:  Dr. Sharol Given.   CHIEF COMPLAINT:  Right knee swelling and pain.   PRIMARY CARE PHYSICIAN:  Dr. Jiles Crocker.   HISTORY OF PRESENT ILLNESS:  The patient is a 60 year old Caucasian  woman with past medical history significant for right knee replacement  in August 2007, status post aspiration and arthroscopy in December 2007  and status post debridement on March 18, 2006.  Presented to the  outpatient clinic with increased swelling, erythema, and pain in her  right knee.  The patient says pain is throbbing, shooting, intermittent,  8 out 10, radiates down her right leg and aggravated by movement.  The  patient also complains of abdominal pain, nausea, vomiting, diarrhea,  since debridement March 18, 2006.  She describes abdominal pain as  burning, epigastric, 5 out of 10, intermittent and without radiation.  The patient also reports she had emesis since March 19, 2006 and that  is without blood.  She reports four to five episodes one day prior to  admission as well as two loose stools on the day of admission and four  to five loose stools one day prior to admission.  She denies  hemetochezia, melena, and hematemesis.  The patient also complaints of  shortness of breath with exertion but denies any cough.   ALLERGIES:  No known drug allergies.   PAST MEDICAL HISTORY:  1. History of polysubstance abuse.  2. Hypertension.  3. Depression.  4. Anxiety.  5. Chronic hepatitis C complicated with portal hypertension.  6. Hepatosplenomegaly.  7. Thrombocytopenia.  8. The patient also has a history of syncope secondary to  arrhythmia.  9. Coronary artery disease.  10.History of a negative Myoview.  11.Degenerative joint disease.  12.Gastroesophageal reflux disease.   MEDICATIONS:  1. Celexa 40 mg p.o. daily.  2. Xanax 1 mg p.o. q.6h.  3. Labetalol 100 mg p.o. b.i.d.  4. Vicodin 5/500 mg p.r.n. pain.  5. Multivitamin.   HABITS:  The patient has never smoked.  She does have a history of  alcohol abuse.  She does report having used cocaine but quit in the  1970s.   SOCIAL HISTORY:  She is widowed.  She is unemployed.  She is a Medicaid  patient.  She lives in an apartment by herself in [place].   FAMILY HISTORY:  Significant for mother who is living who has breast  cancer as well as an MI at the age of 49.  Father passed away from lung  cancer and asbestosis.  She has a brother who passed away from lung  cancer at the age of 56, a brother who passed away from MI at age 3.  She has a daughter with dyslexia.   REVIEW OF SYSTEMS:  As mentioned in the HPI.   PHYSICAL EXAMINATION:  VITAL SIGNS:  Temperature 98.4, blood pressure  114/74, pulse 71, respiration rate 20, oxygen saturation 99% on room  air, weight 166.4.  GENERAL:  The patient is resting comfortably in no acute distress.  HEENT:  Extraocular muscles intact.  Anicteric.  ENT:  Whitish film on  tongue. Dentures.  No oropharyngeal erythromycin.  NECK:  Supple.  No lymphadenopathy.  No carotid bruits.  RESPIRATORY:  Clear to auscultation bilaterally.  CARDIOVASCULAR:  S1 and S2.  Irregularly irregular rhythm.  Positive 1/6  systolic murmur.  No rubs, no gallops.  GI:  Soft, positive bowel sounds.  Heavy gastric tenderness.  No  guarding.  No rebound.  EXTREMITIES:  Right knee swollen, warm, erythematous, decreased range of  motion secondary to pain.  SKIN:  No jaundice.  LYMPHADENOPATHY:  None noted.  MUSCULOSKELETAL:  Decreased range of motion in the right lower extremity  secondary to pain.  The rest of the extremities is  normal. NEUROLOGIC:  Nonfocal.  PSYCHIATRY:  Flat affect.   LABORATORY DATA:  Sodium 140, potassium 4.3, chloride 108, bicarb 24,  BUN 9, creatinine 0.65, glucose 97, anion gap 8,  bilirubin 0.9, alk phos  88, AST 23, ALT 20, protein 6.9, albumin 3, calcium 9.1.  WBC 5.3,  hemoglobin 10.4, platelets 190,000.  ANC 2.9, MCV 88.7, RDW 14.8.  ESR  equal to 45.  PT 15.7, INR 1.2, PTT 28.  Lipase 18, alcohol less than 5.  CK 36, CK-MB 0.4, troponin 0.02.  X-ray of the knee showed large joint  effusion.  New focal lucency along the undersurface of the patella.  There is concern for osteomyelitis and a three-phase bone scan would be  recommended.  Chest x-ray was done which showed no evidence for  pneumonia.  Right upper lobe scar versus atelectasis.   HOSPITAL COURSE:  Problem #1.  The patient presented to the outpatient  clinic with increased erythema, swelling, and pain to the right knee.  The patient was admitted for presumed cellulitis and possible septic  joint.  The patient was started on vancomycin and Zosyn for antibiotic  coverage.  An x-ray of the right knee was obtained and was suspicious  for osteomyelitis so radiology recommended a three-phase bone scan which  was done and revealed osteomyelitis.  The patient underwent total knee  apparatus removal and a spacer was placed with the intent of going back  in in approximately six weeks or so.  The patient was put on rifampin  and vancomycin after surgery and Zosyn was discontinued.  The patient  was then found to have a urinary tract infection and was started on  Cipro.  Since UTIs can serve as seeds for infection, it was decided to  use Cipro for six weeks.  Rifampin was discontinued after curbside with  infectious disease.  Secondary to biofilm no longer being present,  rifampin was stopped.  The patient was discontinued with home health  PT/OT and will follow up with Dr. Sharol Given in two weeks and Dr. Jiles Crocker in the  outpatient  clinic.   Problem #2.  Anemia.  The patient has chronic anemia but experienced  blood loss secondary to the surgery.  The patient received 4 units  packed red blood cells with stabilization of hemoglobin.   Problem #3.  Hypotension secondary to #2.  Hypotension resolved with  fluid resuscitation and packed red blood cells.   Problem #4.  Hypokalemia.  Magnesium was checked and was decreased.  Both potassium and magnesium were repleted.  Both levels were on the  lower end of normal, so the patient was sent home on magnesium oxide and  K-Dur.  Continued use of K-Dur and magnesium oxide should be evaluated  at followup visit in the outpatient clinic.   Problem #5.  Polysubstance abuse.  The patient was given thiamine and  folate during her hospitalization.   Problem #6.  Depression/anxiety.  The patient was continued on her home  dose of Xanax and Celexa.   Problem #7.  Hepatitis C.  The patient's CMET during hospitalization was  normal.  Problem #8.  Urinary tract infection.  The patient was put on Cipro and  will stay on it for six weeks as mentioned above.   Discharge labs include WBC 3.9, hemoglobin 9.6, hematocrit 27.4, MCV  88.5, platelets 173.  PT 18.8, INR 1.5.  Sodium 139, potassium 3.6,  chloride 107, bicarb 25, glucose 109, BUN 4, creatinine 0.52, GFR in the  60s.  Calcium 8.7, phosphorus 4, magnesium 2.   Discharge vital signs include temperature 98.4, pulse 93, respiratory  rate 20, blood pressure 155/81, oxygen saturation 96% on room air.  Katha Cabal, M.D.  Electronically Signed     ______________________________  Lucy Chris, MD    JY/MEDQ  D:  04/06/2006  T:  04/06/2006  Job:  [id number]   cc:   Rico Sheehan, D.O.  Adin Hector, MD  Newt Minion, MD

## 2010-08-02 NOTE — Consult Note (Signed)
NAME:  [name], [name] NO.:  [id number]   MEDICAL RECORD NO.:  [id number]          PATIENT TYPE:  INP   LOCATION:  5956                         FACILITY:  [facility]   PHYSICIAN:  Satira Sark, M.D. LHCDATE OF BIRTH:  July 01, 1950   DATE OF CONSULTATION:  10/30/2004  DATE OF DISCHARGE:                                   CONSULTATION   REASON FOR CONSULTATION:  [name] is a 61 year old female with no known  cardiac history recently admitted here for evaluation of syncope at which  time a 2D echocardiogram revealed normal left ventricular, who now presents  with an abnormal electrocardiogram.  We are asked to evaluate frequent PVCs  and ventricular bigeminy.   The patient reports that she was in the process of admitting herself at the  [facility] on Monday for treatment of alcohol  abuse.  While there, she was found to have an irregular pulse on examination  and a follow up electrocardiogram revealed frequent PVCs.  She was thus  referred to the emergency room for further evaluation.  Since admission, the  patient does report occasional palpitations and review of telemetry reveals  frequent PVCs with ventricular bigeminy, but no couplets or triplets.  Of  note, the patient did have a potassium level of 3.4 on admission which has  since normalized.   During her recent hospitalization for syncope, the patient underwent  extensive evaluation including a 2D echocardiogram which revealed normal  left ventricular function (EF 55-65%), with mild aortic root dilatation and  no significant valvular abnormalities.   ALLERGIES:  No known drug allergies.   MEDICATIONS PRIOR TO ADMISSION:  Copegus 600 mg b.i.d., Xanax 1 mg q.i.d.,  Citalopram 40 mg daily, Pegasys 180 mg weekly.   PAST MEDICAL HISTORY:  Recurrent syncope with extensive recent workup  consisting of negative carotids, negative EEG, and negative head MRA.  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  The patient also has  a history of hepatitis C, thrombocytopenia followed by Dr. Everardo All,  and depression.  The patient also has a history of significant alcohol use  and abuse, reportedly sober for approximately 3 1/2 years, but with relapse  in March of this year.  The patient also reports a history of childhood  bronchial asthma.  Hyperlipidemia was noted by recent lipid profile with  total cholesterol of 160, triglycerides 208, HDL 28, LDL 90.   SOCIAL HISTORY:  The patient is widowed, lives in [place], has two children.  She is currently unemployed.  She reports having never smoked tobacco.  She  denies alcohol use since February of this year.  The patient also denies  illicit drug use including cocaine.   FAMILY HISTORY:  Mother age 61 with history of congestive heart failure.  Father deceased, complications from lung cancer.  A brother deceased at age  30 secondary to myocardial infarction.   REVIEW OF SYMPTOMS:  The patient reports occasional chest pain with or  without exertion, exertional dyspnea, occasional orthopnea, and occasional  edema.  She has a history of  palpitations the patient describes as  fluttering.  She also reports dizziness with exertion, but denies any  frank syncopal episode since her recent hospitalization.  The patient denies  any history of myocardial infarction, congestive heart failure, or stroke.  She does report easy bruising secondary to her thrombocytopenia, but no  recent over bleeding.  Remaining systems negative.   PHYSICAL EXAMINATION:  VITAL SIGNS:  Blood pressure 116/68, temperature 97.3, pulse 50,  respirations 20, SAO2 97% on room air.  GENERAL:  60 year old female, obese, in no apparent distress.  HEENT:  Normocephalic, atraumatic.  NECK:  Bilateral carotid pulses without bruits, no JVD.  LUNGS:  Clear to auscultation in all fields.  HEART:  Regular rate and rhythm (S1 and S2), no significant  murmurs.  ABDOMEN:  Soft, nontender with intact bowel sounds.  EXTREMITIES:  Preserved bilateral femoral pulses without bruits, intact  distal pulses with no significant pedal edema.  NEUROLOGICAL:  Flat affect but with no focal deficit.   Admission chest x-ray with no acute distress.   Electrocardiogram shows normal sinus rhythm with ventricular bigeminy.  The  calculated prolonged QT (700-850) has been reviewed and is more within the  range of 400-450.   LABORATORY DATA:  Hemoglobin 10.6, hematocrit 31, WBC 1.6, platelets 159.  Sodium 138, potassium 4.2 (3.4 on admission), BUN 9, creatinine 0.8, glucose  106.  Elevated AST 49, otherwise, normal liver enzymes.  D-dimer less than  0.22.  Alcohol level less than 5.  Initial cardiac markers show CPK 46/0.5,  troponin I 0.03.  Urinalysis negative.  Urine drug screen positive for  cocaine and benzodiazepines.   IMPRESSION:  1.  Frequent unifocal PVCs with ventricular bigeminy.  The PVCs appear to be      of left ventricular origin and the recent echocardiogram revealed normal      left ventricular function as well as no structural abnormalities of the      right ventricle.  The patient does report occasional palpitations and      also a history of recurrent syncope (although not associated with      palpitations).  She reports at least 3-4 episodes of syncope over the      last six months.  Of note, two episodes occurred while cleaning in her      home and she reports feeling hot and nauseated.  One episode occurred      upon standing after having cleaned her bathtub.  Although the patient      underwent extensive workup for her syncope during recent      hospitalization, she did not undergo evaluation of ischemia.  2.  Positive urine drug screen (cocaine).  The patient, however, denies use.  3.  History of alcohol abuse.  The patient actually was in the process of     admitting herself into the [facility] when she was found  to      have an irregular heart beat with subsequent ventricular bigeminy on      the electrocardiogram.  4.  Question of prolonged QT.  Per our review, this is clearly over      estimated by the computer.  The actual QTC appears to be approximately      420 milliseconds.  5.  Idiopathic thrombocytopenia - followed by Dr. Everardo All.  6.  Hyperlipidemia.  7.  Exertional dyspnea and occasional chest pain.  8.  Hepatitis C.   PLAN:  The recommendations are to continue monitoring on telemetry  to  exclude any sustained arrhythmia.  We will also order an exercise stress  Myoview to rule out ischemia as well as to see if the PVCs are suppressed  with exercise.  Consideration will be given to initiating a trial of  Labetalol for suppression of PVCs as well as treatment for possible newly  mediated syncope, but we will hold off on this pending review of the stress  test.  Of note, this  would be a preferred beta blocker medication given the presence of cocaine  in the urine drug screen.  We will also cycle two sets of cardiac markers  and check a TSH level.  Further recommendations will be made pending review  of the electrocardiogram with our electrophysiology team.      Gene Serpe, P.A. [facility]    ______________________________  Satira Sark, M.D. [facility]    GS/MEDQ  D:  10/30/2004  T:  10/30/2004  Job:  [id number]

## 2010-08-02 NOTE — Discharge Summary (Signed)
NAME:  [name], [name] NO.:  [id number]   MEDICAL RECORD NO.:  [id number]          PATIENT TYPE:  INP   LOCATION:  3662                         FACILITY:  [facility]   PHYSICIAN:  Jay Schlichter, M.D.  DATE OF BIRTH:  1950/08/17   DATE OF ADMISSION:  10/28/2004  DATE OF DISCHARGE:  11/01/2004                                 DISCHARGE SUMMARY   CONSULTANTS:  Satira Sark, M.D., of [facility].   DISCHARGE DIAGNOSES:  1.  Multiple unifocal premature ventricular contractions with periods of      ventricular bigeminy.  2.  Hepatitis C virus infection.  3.  Low absolute neutrophil count.  4.  Question of prolonged QT syndrome.  5.  Anxiety.  6.  Idiopathic thrombocytopenic purpura.  7.  Positive cocaine urine drug screen.  8.  History of syncope.  9.  Depression.  10. Hypertension.  11. History of alcohol abuse.   DISCHARGE MEDICATIONS:  1.  Xanax 1 mg one tablet up to four times a day as needed for anxiety.  2.  Labetalol 100 mg b.i.d.  3.  Zoloft 50 mg daily.  4.  Copegus 400 mg b.i.d.  5.  Pegasys 135 mcg once a week on Thursdays.  6.  Ambien 5 mg one tablet at night as needed for insomnia, number dispensed      10.   DISPOSITION AND FOLLOW-UP:  The patient is discharged to home for the  weekend.  It is anticipated that on Monday morning, she will contact the  [facility] alcohol rehab program at [facility] for admission for two  weeks of alcohol education and rehab.  The patient may request per her judge  for an extended 30-day stay at [facility].  This decision is  pending.  Upon discharge from [facility], the patient is to follow up as  soon as possible with her liver clinic nurse practitioner, Dow Adolph, for  assessment of her HCD status.  Next Thursday, it is requested that at [facility] a CBC with differential and basic metabolic panel be obtained and  faxed to Dow Adolph of the [employer] [facility],  whose fax  number is [phone].  The patient will follow up with her continuity  doctor in approximately a month after her discharge from [facility].  The  patient will be contacted with an appointment time.   PROCEDURES PERFORMED:  1.  Chest x-ray August 14:  No evidence for any active chest disease.  2.  An exercise myocardial Myoview stress test, which revealed no evidence      of inducible left ventricular ischemia and a left ventricular ejection      fraction of 58%.   CONSULTATIONS:  Dr. Domenic Polite of cardiology.   ADMISSION H&P:  The patient is a 60 year old Caucasian female who presented  to the [facility] program for admission for a two-week alcohol abuse  program on August 14.  Upon arrival, the patient was noted to have an  arrhythmia on her EKG and a decreased heart rate and was sent to [facility]  emergency department.  The patient had also complained of some chest pain,  which was sharp in quality in the left upper chest that began about two  weeks ago.  The patient has reported some dizziness, palpitations and  shortness of breath associated with very minimal exertion.  The patient was  admitted for evaluation of her arrhythmia and she received medical clearance  prior to readmission to the [facility] program.   ADMISSION LABORATORY DATA:  Sodium 134, potassium 3.4, chloride 101, bicarb  23, BUN 9, creatinine 0.9, glucose 100, bilirubin 0.9, alkaline phosphatase  78, AST 49, ALT 36, protein 7.1, albumin 4.0, calcium 9.3.  D-dimer less  than 0.22.  INR 1.0.  White blood cell count 2.0, hemoglobin 10.9,  hematocrit 32, platelets 64.  Absolute neutrophil count 1.2, MCV 105.  Alcohol undetectable.  Urinalysis normal.  Urine drug screen positive for  benzodiazepines and cocaine.   HOSPITAL COURSE:  Problem 1.  MULTIPLE UNIFOCAL PREMATURE VENTRICULAR CONTRACTIONS WITH SOME  VENTRICULAR BIGEMINY:  EKG on admission was notable for multiple premature   ventricular contractions that were unifocal in nature with occasional runs  of ventricular bigeminy.  Notably, during the patient's four-day stay here,  no consecutive runs of PVCs were noted.  A cardiology consult was obtained  to assess for a potential mechanism of this arrhythmia.  Notably, the  patient had been admitted at [facility] the end of July 2006 for a workup of  syncope, which included an echocardiogram with no evidence of any cardiac  abnormalities.  An exercise Myoview scan obtained during this admission was  notable for an ejection fraction of 58% with no evidence of cardiac  ischemia.  Per cardiology, the patient was begun on a regimen of 100 mg of  labetalol b.i.d.  Notably, after the initiation of this regimen, the  absolute number and duration of the patient's premature ventricular  complexes was decreased.  It was recommended that the patient be observed  and possibly titrated up on her labetalol dose as an outpatient.  Notably,  cardiology did not feel the patient had any acute or underlying cardiac  illness that would give immediate concern.  Notably, the patient had her  cardiac enzymes cycled during her stay, and they were negative x3.   Problem 2.  HEPATITIS C VIRUS INFECTION:  The patient was diagnosed with  hepatitis C in October 2005 with the liver biopsy indicating grade 2, stage  I disease.  On March 27 of this year, the patient was started on a 48-week  regimen of ribavirin and Interferon and has been followed closely for  response to treatment by Dow Adolph of [facility].  Notably, the patient has had a good response to this regimen and had had  viral loads that were undetectable over the last several months.  In  consultation with Dow Adolph, it was decided that the patient should  continue on this hepatitis C virus regimen in spite of her multiple medical  problems and questionable history of cocaine use.  Based on the  patient's absolute neutrophil count and hemoglobin, it was decided to adjust the  patient's medication regimen to Copegus 400 mg b.i.d. and the Pegasys 135  mcg once a week on Thursdays.  It is requested that the patient have a CBC  with differential drawn next Thursday, August 31, and have these results  faxed to Dow Adolph for assessment of response to treatment.  The patient  was  counseled that any further evidence of either cocaine or alcohol abuse  would threaten her ability to continue receiving treatment for the purpose  of HCV eradication.  A hepatitis C viral load drawn during admission was not  available at the time of discharge.   Problem 3.  DECREASE IN ABSOLUTE NEUTROPHIL COUNT AND WHITE BLOOD CELL  COUNT:  As the patient is receiving aggressive therapy for HCV eradication,  a decrease in the [place] is expected per Dow Adolph.  The threshold for  concern in terms of changing or halting the regimen is an absolute  neutrophil count below 0.5.  Notably, at the time of discharge the patient's  [place] is 1.1 above this threshold, and thus it is safe to continue with her  HCV regimen.   Problem 4.  QUESTIONABLE PROLONGED QT SYNDROME:  Upon admission, the patient  had several echocardiograms, which the computer suggested may be notable for  prolonged QT syndrome. Notably, the Q-waves on the EKGs were flat, making  calculation of this interval difficult.  In consultation with cardiology, it  was decided that the patient most likely does not have any prolongation of  her QT interval.  Nonetheless, because the patient was taking Celexa for her  depression and because this medication has been associated in rare instances  with prolongation of QT syndrome, it is recommended by cardiology that this  medication be discontinued.  The patient was subsequently begun on Zoloft,  which per pharmacy has a lower incidence of QT prolongation.   Problem 5.  ANXIETY:  Mental health records were  obtained from [facility], which confirmed that the patient had been prescribed a  regimen of Xanax of 1 mg up to four times a day.  On their recommendation,  the patient was continued on this regimen.   Problem 6.  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  It is felt that this ITP may  be secondary to her infection with HCV.  The patient has had a somewhat  extensive workup for this problem in the past, most notable for an  unremarkable bone marrow biopsy and a serum level of increased antiplatelet  titer.  Notably, per Dow Adolph, the patient has had good response in  terms of increased platelet counts in response to the HCV therapy.  During  the patient's five-day admission here, her platelets ranged from 53 to 59  with a discharge platelet count of 53.   Problem 7.  POSITIVE COCAINE URINE DRUG SCREEN:  Upon admission, the patient  was noted to have a positive cocaine urine drug screen.  The patient has never before tested positive for cocaine.  Notably, the patient denies ever  smoking or injecting cocaine and does not have any idea what could have  caused these results.  The patient can only suggest that the Friday prior to  admission she went to a cook-out and after that cook-out she felt sick for  the next several days, leading her to wonder if she ingested something  unknowingly, causing her to get sick.  The patient denies smoking any  substance at all and denies any IV injection.  It is recommended that the  patient be closely monitored at subsequent visits for cocaine in her urine,  as this substance could negatively contribute to her cardiac status and the  success of her HCV regimen.   Problem 8.  HISTORY OF SYNCOPE:  The patient was admitted and extensively  worked up for syncope at the end of July.  Notably, the patient reports no  further episodes of syncope since that admission.   The patient was scheduled  on August 29 to have an EEG study after 24 hours' sleep deprivation;  however, the patient will likely be at [facility] during this time.  This  will need to be rescheduled.  The patient also reports being scheduled to  have a loop monitor test done, and this will also need to be rescheduled.   Problem 9.  DEPRESSION:  The patient has a long history of depression going  back to at least 2002.  A component of this may be secondary to her HCV  regimen. Notably, the patient reports exacerbation of these symptoms for two  to seven days after she receives her interferon shot every week.  As stated  above, during this admission the patient was switched from Celexa 40 mg  daily to Zoloft 50 mg daily over a remote and unlikely but still possible  concern for prolonged QT syndrome.   Problem 10.  HYPERTENSION:  The patient's blood pressure was unremarkable  during her stay.   Problem 1.  HISTORY OF ALCOHOL ABUSE:  The patient reports no alcohol use at  all since February 2006.  She describes frequent alcohol abuse prior to that  time leading up to her DUI conviction.  The patient reports that she is only  now going into the rehab program at [facility] because there was a long  waiting list.  The patient had no detectable blood alcohol level upon  admission.   DISCHARGE LABORATORY DATA:  Sodium 141, potassium 4.0, chloride 107, bicarb  26, BUN 11, creatinine 1.0, glucose 105, calcium 9.1.  White blood cell  count 1.8, hemoglobin 9.8, hematocrit 28.8, platelet count 53, ANC 1.1.  TSH  2.29.      Erma Heritage, M.D.    ______________________________  Jay Schlichter, M.D.    TE/MEDQ  D:  11/02/2004  T:  11/02/2004  Job:  [id number]   cc:   [facility] associated with Margaretmary Dys Neijstrom, MD  [address]  [place]  [place] [address]  Fax: [facility]

## 2010-08-02 NOTE — Procedures (Signed)
CLINICAL INFORMATION:  This patient is being evaluated for the possibility  of syncope.   TECHNICAL DESCRIPTION:  This EEG was recorded entirely during the awake  state. The background activity shows 14 Hz rhythms with some superimposed  low voltage fast beta activity. No focal asymmetries and no evidence of  stage II sleep. Eye opening and eye closing maneuvers had the appropriate  response on the background activity. Photic stimulation produced a driving  response bilaterally in the occipital head regions. Hyperventilation testing  did not produce any significant abnormalities.   No evidence of any epileptiform activity was seen.   IMPRESSION:  This is a normal EEG during the awake state.       XHF:SFSE  D:  10/10/2004 09:46:28  T:  10/10/2004 17:05:52  Job #:  [id number]   cc:   Thomes Lolling, M.D.  [address] [place]  [place] [address]  Fax: [phone]

## 2010-08-02 NOTE — Discharge Summary (Signed)
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   PROCEDURE:  Revision right total knee with removal of antibiotic spacer  and placement of a revision total knee arthroplasty.   DISPOSITION:  Discharged to home in stable condition.   DISCHARGE MEDICATIONS:  Vicodin for pain and Coumadin for DVT  prophylaxis.   FOLLOW UP:  Follow up in the office in 2 weeks.   HISTORY OF PRESENT ILLNESS:  The patient is a 60 year old woman who is  status post a total knee arthroplasty who remotely had developed a  septic knee.  The patient's cultures were negative.  Laboratory studies  were negative; however, at surgical intervention the patient had a  significant septic arthritis.  Due to the septic arthritis with total  knee, the patient underwent excision of the total knee arthroplasty  components, placement of antibiotic spacer, placement on IV antibiotics  for 6 weeks, and presents at this time for revision total knee  arthroplasty.   HOSPITAL COURSE:  The patient's hospital course was essentially  unremarkable.  She underwent revision total knee arthroplasty on  May 06, 2006.  She received a #3 femur, #3 tibia, with a 25-mm poly  spacer all from [employer].  She received Kefzol for infection prophylaxis.  Tourniquet time was 83 minutes, and there was one medium Hemovac drain  placed.   Postoperatively the patient progressed well.  The Hemovac drain was  removed on postoperative day #1.  She was continued on IV antibiotics  during her hospital stay.  Her hemoglobin  dropped to 5.8 on May 07, 2006, and she was typed and crossed and transfused with 2 units of  packed red blood cells.  The patient progressed well with physical  therapy.  She initially started with full range of motion of the knee.  The patient was discharged to home in stable condition with home health  physical therapy on May 09, 2006, with followup in the office in 2  weeks.      Newt Minion, MD  Electronically Signed     MVD/MEDQ  D:  07/02/2006  T:  07/02/2006  Job:  [id number]

## 2010-08-02 NOTE — Op Note (Signed)
NAMEHAWLEY, PAVIA               ACCOUNT NO.:  [id number]   MEDICAL RECORD NO.:  [id number]          PATIENT TYPE:  INP   LOCATION:  2899                         FACILITY:  [facility]   PHYSICIAN:  Newt Minion, MD     DATE OF BIRTH:  [birth year]   DATE OF PROCEDURE:  10/22/2005  DATE OF DISCHARGE:                                 OPERATIVE REPORT   PREOPERATIVE DIAGNOSIS:  Osteoarthritis right knee.   POSTOPERATIVE DIAGNOSIS:  Osteoarthritis right knee.   PROCEDURE:  Right total knee arthroplasty with DePuy components, a 2.5  femur, 3.0 tibia, 10 mm posterior stabilized poly tray with a 35-mm patella.   SURGEON:  Newt Minion, MD   ANESTHESIA:  General.   ESTIMATED BLOOD LOSS:  Minimal.   ANTIBIOTICS:  1 gram of Kefzol.   DRAINS:  None.   COMPLICATIONS:  None.   TOURNIQUET TIME:  51 minutes at 300 mmHg.   DISPOSITION:  To PACU in stable condition.   INDICATIONS FOR PROCEDURE:  The patient is a 60 year old woman with  osteoarthritis of her right knee.  She has failed conservative care, has  failed treatment with arthroscopy and presents at this time for right total  knee arthroplasty with pain with activities of daily living.  The risks and  benefits were discussed including infection, neurovascular injury,  persistent pain, need for additional surgery, DVT, pulmonary embolus. The  patient states she understands and wishes to proceed at this time.   DESCRIPTION OF PROCEDURE:  The patient was brought to OR room 4 and  underwent a general anesthetic.  After adequate levels of anesthesia  obtained, the patient's right lower extremity was prepped using DuraPrep and  draped into a sterile field and Ioban was used to cover all exposed skin.  The knee was flexed and the skin was incised. A medial parapatellar  retinacular incision was made.  The tourniquet was inflated to 300 mmHg.  The guide holes was made in the femur.  A femur block was placed.  This was  set for 2.5 and a  2.5 cutting block was used after the distal femoral cut of  11 mm was taken.  Attention was then focused on the tibia.  The external  alignment guide was placed and this was set to take 10 mm off the least  involved tibial lateral plateau.  This was sized with a sizing block and had  adequate resection. This was set for neutral varus and valgus and neutral  posterior slope.  The keel punch was then made for the tibia which sized for  a size 3.  The cutting block was placed for the box cut out on the femur and  this was cut, trial components were placed with the 2.5 posterior stabilized  femur, 3.0 tibia with a 10 mm trial. This was placed since the patient had  restoration of her varus malalignment with a straight knee. The varus and  valgus stress was stable.  She had full extension and full flexion.  The peg  cuts were made and attention was then focused on the patella.  The patella  was taken 10 mm off the patella and this was sized for a 35 and the peg  holes were made for the 35-mm patella.  The wound was then irrigated with  pulse lavage.  The cement was mixed and the knee was injected with a total  of 30 mL of 0.25% Marcaine plain.  The tibial and then femoral components  were cemented in place, loose cement was removed.  The tibial tray was  placed, the knee was left in extension until the cement hardened. The  patellar component was also cemented in place and left clamped until the  cement hardened.  The knee was then finally irrigated with pulse lavage.  The tourniquet was deflated after 51 minutes and hemostasis was obtained.  The knee was placed through a full range of motion and there was good  tracking of the patella and no lateral release was performed.  The capsule  was closed using #1 Vicryl, subcu was closed using 2-0 Vicryl.  The skin was  closed using approximating staples. The wound was covered with Adaptic  orthopedic sponges, sterile Webril and a Coban dressing.  The  patient was  extubated, taken to the PACU in stable condition.      Newt Minion, MD  Electronically Signed     MVD/MEDQ  D:  10/22/2005  T:  10/22/2005  Job:  [phone] [phone]

## 2010-08-02 NOTE — Discharge Summary (Signed)
[name], EVON               ACCOUNT NO.:  [id number]   MEDICAL RECORD NO.:  [id number]          PATIENT TYPE:  INP   LOCATION:  8250                         FACILITY:  [facility]   PHYSICIAN:  Newt Minion, MD     DATE OF BIRTH:  [birth year]   DATE OF ADMISSION:  11/08/2005  DATE OF DISCHARGE:  11/12/2005                                 DISCHARGE SUMMARY   DIAGNOSIS:  Open wound with infection, suprapatellar bursa, right knee,  status post fall.   PROCEDURE:  Irrigation and debridement of the suprapatellar bursa, right  knee, with irrigation and debridement of the right total knee arthroplasty.   DISPOSITION:  Discharged to home in stable condition.   DISCHARGE MEDICATIONS:  1. Tylox.  2. Flexeril.  3. Bactroban.  4. Doxycycline.   FOLLOWUP:  Followup in the office in 2 weeks, with home health physical  therapy for range of motion and progressive ambulation.   HISTORY OF PRESENT ILLNESS:  The patient is a 60 year old woman who is  status post total knee replacement on October 22, 2005.  She was discharged to  home on October 25, 2005.  The patient was recently fell on her right knee,  opening the incision, and developed cellulitis and redness around the  suprapatellar bursa.  The patient was seen in the emergency room and was  admitted by Dr. Louanne Skye on November 08, 2005.  Due to concern of possible  infection within the total knee, the patient was surgically treated by Dr.  Louanne Skye.  The patient was taken to the OR and underwent irrigation and  debridement of the suprapatellar bursa, as well as irrigation and  debridement of the right knee.  Surgery was performed on November 09, 2005.  Infectious disease was also consulted regarding medical management of her  potential infection.  Examination on November 10, 2005 showed that the  cultures were negative to date, the cellulitis had resolved.  The infection  was most likely a superficial cellulitis.  The drains were discontinued on  November 10, 2005.  The patient progressed well.  Her cultures remained  negative on November 12, 2005.  Her PCA was discontinued on November 11, 2005.  She was discharged to home in stable condition on November 12, 2005 with  followup in the office in 2 weeks.  She will continue with her p.o.  antibiotics and will call should there be any changes to her clinical exam.      Newt Minion, MD  Electronically Signed     MVD/MEDQ  D:  12/09/2005  T:  12/11/2005  Job:  [id number]

## 2010-08-02 NOTE — Discharge Summary (Signed)
[name], [name]               ACCOUNT NO.:  [id number]   MEDICAL RECORD NO.:  [id number]          PATIENT TYPE:  INP   LOCATION:  2003                         FACILITY:  [facility]   PHYSICIAN:  Newt Minion, MD     DATE OF BIRTH:  22-Aug-1950   DATE OF ADMISSION:  10/22/2005  DATE OF DISCHARGE:  10/25/2005                                 DISCHARGE SUMMARY   DIAGNOSIS:  Osteoarthritis right knee.   PROCEDURE:  Right total knee arthroplasty with DePuy components of 2.5  femur, 3.0 tibia, 10 mm posterior stabilized poly tray with 35 mm patella.   HISTORY OF PRESENT ILLNESS:  The patient is a 60 year old woman with  tricompartmental osteoarthritis of right knee.  She has failed conservative  care and presents at this time for total knee replacement due to pain with  activities of daily living.  The patient underwent a total knee arthroplasty  on August 8.  She received Kefzol for infection prophylaxis and was started  on Coumadin for DVT prophylaxis.  Tourniquet time was 51 minutes at 300  mmHg.  The patient was started on physical therapy on postoperative day #1.  She progressed well with physical therapy.  Was felt to be safe with therapy  with ambulation.  She was set up with [facility] and [facility] occupational therapy with follow-up will the office 1 week after  discharge.  She was discharged to home in stable condition on August 11,  with prescriptions for Coumadin for DVT prophylaxis and Vicodin for pain.      Newt Minion, MD  Electronically Signed     MVD/MEDQ  D:  12/09/2005  T:  12/11/2005  Job:  [id number]

## 2010-08-02 NOTE — Op Note (Signed)
NAMEBRENDIA, [name]               ACCOUNT NO.:  [id number]   MEDICAL RECORD NO.:  [id number]          PATIENT TYPE:  INP   LOCATION:  5041                         FACILITY:  [facility]   PHYSICIAN:  Jessy Oto, M.D.   DATE OF BIRTH:  02/25/1951   DATE OF PROCEDURE:  11/09/2005  DATE OF DISCHARGE:                                 OPERATIVE REPORT   PREOPERATIVE DIAGNOSES:  Right total knee arthroplasty with intra-articular  effusion and presumed infection.  Right total knee arthroplasty prepatellar  septic bursitis.   POSTOPERATIVE DIAGNOSES:  Right knee total knee arthroplasty infection  early, 2 weeks postop.  Right knee prepatellar bursitis septic.   PROCEDURE:  Right knee arthroscopic irrigation and lavage.  Arthroscopic  evaluation.  Right anterior knee incision, drainage and excisional  debridement of infected prepatellar bursa superiorly.   PACKING:  Wet-to-dry dressing.   SURGEON:  Jessy Oto, M.D.   ANESTHESIA:  GOT Soledad Gerlach, MD.   ESTIMATED BLOOD LOSS:  100 mL.   DRAINS:  Two large Hemovac drains through portals over the lateral aspect of  the right knee.   TOTAL TOURNIQUET TIME:  Zero minutes.   BRIEF CLINICAL HISTORY:  The patient is a 60 year old female who has  undergone previous right total knee arthroplasty by Dr. Newt Minion on  October 22, 2005.  Postoperatively, she had quite a bit of drainage and  difficulties with Coumadin management and PT elevation.  Eventually placed  on Coumadin at 1 mg a day.  She has a past history of cocaine use and  alcohol abuse.  She has had problems with elevation of her coagulation  studies by using minimal doses of Coumadin.  After significant drainage  postoperatively, eventually her incision stabilized.  On the postop 9, she  returned to the office and had removal of her staples and Steri-Strip of the  incision.  She apparently bumped her  knee getting out of the car with the  knee acutely flexing.   She had opening of the anterior upper aspect of the  incision.  Over the next several days, this became red and erythematous.  She presented at [facility] where she had initial evaluation,  then she was placed on antibiotics and admitted to [facility] on  November 03, 2005.  The patient was treated with IV antibiotics, discharged  two days ago, and told to call her surgeon to be continued on IV  antibiotics.  The patient was admitted through the emergency room at [facility] last evening after discussion over the telephone and decision  that this needed further evaluation.  She was found to have warm  erythematous right total knee arthroplasty with induration, evidence of  purulence over the anterior aspect of the patella superiorly.  Intra-  articular fusion, aspiration and effusion demonstrated brown thin material  and a Gram's stain showed no organisms seen but abundant white cells.  She  was brought to the operating room to undergo irrigation of her knee and  lavage of her knee along with I&D of the anterior  prepatellar septic bursa.  The patient's white cell count was normal.  Sedimentation rate was 85.   INTRAOPERATIVE FINDINGS:  The patient was noted to have hematoma in the  prepatellar region extending superiorly along the anterior aspect of quad  tendon and quad mechanism.  Also found to have significant effusion with  thin brown cloudy material.  These were sent for culture and sensitivity.  Both anaerobic and aerobic cultures were obtained.   DESCRIPTION OF PROCEDURE:  After adequate general anesthesia, the right  lower extremity was prepped from the ankle to the upper thigh with DuraPrep  solution as the incision was basically very closed.  She was draped in the  usual manner.  Tourniquet about the upper thigh was not used during the  case.  The patient had a stab incision over the superior lateral aspect of  the knee and area away from induration and  areas of skin changes, and an  inflow cannula was inserted into the knee with immediate efflux of purulent  looking material, brown yellowish effusion.  This was sent for culture and  sensitivity both anaerobic and aerobic.  The patient is on antibiotics,  vancomycin and Primaxin.  After this was performed, an incision was then  made over the anterior inferior lateral aspect of the knee into the knee  joint using an 11 blade scalpel.  Small subcutaneous varicosity  unfortunately was partially stabbed with some bleedings apparent.  Trocar  and other cannula was inserted here into the knee joint beneath the knee  cap.  Inflow was then begun through the superior area and total of 12 liters  of normal saline solution were passed through the total knee arthroplasty  debriding, removing and lavaging the knee with flexion/extension throughout  this case.  After completion of nearly 12 liters, then an arthroscopic  evaluation was carried out.  The patient was found to have significant  inflammatory synovitis throughout the knee present.  No gross changes of any  significant necrotic debris.  Large Hemovac drains were then placed through  the cannula into the knee joint.  These were sewn in place with 4-0 nylon  sutures.  Attention was then turned to the anterior aspect of the knee where  the incision line was opened superiorly and bluntly, opening with Mayo  scissors.  Removing a small suture subcutaneously.  Hematoma was then  expressed from the incision which had a brown yellow appearance with that of  a possible infection material.  This was then irrigated and then packed with  vaginal packing with polymyxin material.  Note that the knee itself was  lavaged with double antibiotic solution after irrigating with 12 liters of  normal saline.  Remaining irrigation was used to irrigate the prepatellar region as well as to soak the packing material that was then placed into the  prepatellar region.   Dry dressing of Adaptic 4x4s, ABD pad, then affixed the  skin with Kerlix.  Ace wrap applied from the right foot to the upper thigh.  Hemovac was then charged.  The patient was then reactivated, extubated and  returned to the recovery room in satisfactory condition.  Also instrument  and sponge counts were correct.      Jessy Oto, M.D.  Electronically Signed     JEN/MEDQ  D:  11/11/2005  T:  11/11/2005  Job:  [id number]

## 2010-08-02 NOTE — Assessment & Plan Note (Signed)
[facility]                         GASTROENTEROLOGY OFFICE NOTE   WILLY, VORCE                      MRN:          [id number]  DATE:07/16/2006                            DOB:          04-29-1950    REASON FOR CONSULTATION:  Consider screening colonoscopy, also  gastroesophageal reflux disease.   ASSESSMENT:  Average risk for screening colonoscopy.   Symptoms compatible with gastroesophageal reflux disease but no  worrisome features.   PLAN:  1. Schedule screening colonoscopy.  2. Prilosec over-the-counter once a day and clinical followup on      reflux symptoms.  3. Risks, benefits and indications of colonoscopy have been discussed      with the patient, she understands and agrees to proceed.   NOTE:  Strongly consider using Benadryl during her procedure because of  concomitant alprazolam and Celexa use.   See my medical history form for full details of the history.   PAST MEDICAL HISTORY:  1. History of hepatitis C cleared with therapy.  2. Took interferon and ribavirin (it sounds like).  3. Hypertension.  4. Asthma.  5. Osteoarthritis.  6. Anxiety and depression.  7. Sleep apnea.  8. Prior hysterectomy.  9. Five knee surgeries with removal of prosthetic knee and subsequent      repeat replacement and multiple infections since August, 2007.      This has been associated with weight loss.   NOTE:  The patient does also complain of bloating and gaseousness and  abdominal cramps at times. Most of that has been related to pain  medication.   MEDICATIONS:  See medical history form also, but she is on aspirin 81 mg  daily, multivitamin daily, folic acid 1 mg daily, vitamin B1 100 mg  daily, vitamin B12 daily, Celexa 20 mg t.i.d., alprazolam 1 mg with  breakfast and lunch, 2 mg at h.s., Labetalol 100 mg b.i.d., albuterol  MDI p.r.n., Restoril 7.5 mg q.h.s. p.r.n., Tylox 5/500 1-2 q.6-8 hours  p.r.n. pain, morphine 15 mg at h.s.  p.r.n. pain.   ALLERGIES:  No known drug allergies.   FAMILY HISTORY:  Negative for colon cancer. Father was an alcoholic.  Mother had breast cancer. Mother and brother have had coronary artery  disease problems with MI.   SOCIAL HISTORY:  She is widowed. She lives with her boyfriend.  She is  disabled. She drinks a few beers occasionally. No tobacco or drugs.   REVIEW OF SYSTEMS:  See my medical history form. She has some limited  mobility issues due to flexion contracture of the right knee.   PHYSICAL EXAMINATION:  VITAL SIGNS:  Height 5 feet, 6 inches, weight 159  pounds, blood pressure 130/94, pulse 76.  GENERAL:  Somewhat chronically ill appearing white woman in no acute  distress.  HEENT:  Eyes anicteric. ENT shows dentures. Free of lesions in the mouth  and posterior pharynx.  NECK:  Supple, no thyromegaly or mass.  CHEST:  Clear.  HEART:  S1, S2 no murmurs or gallops.  ABDOMEN:  Soft, nontender without organomegaly or mass.  RECTAL EXAM:  Deferred.  LYMPHATIC:  No neck or supraclavicular nodes.  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 She can extend it to approximately 150-160 degrees, I  think.  There is some swelling and surgical scars of the right knee but no acute  process there.  NEURO:  She is alert and oriented x3.   I appreciate the opportunity to care for this patient.     Gatha Mayer, MD,FACG  Electronically Signed    CEG/MedQ  DD: 07/17/2006  DT: 07/17/2006  Job #: [id number]

## 2010-08-02 NOTE — Discharge Summary (Signed)
[name], [name]               ACCOUNT NO.:  [id number]   MEDICAL RECORD NO.:  [id number]          PATIENT TYPE:  INP   LOCATION:  3614                         FACILITY:  [facility]   PHYSICIAN:  Thomes Lolling, M.D.    DATE OF BIRTH:  12/24/1950   DATE OF ADMISSION:  10/08/2004  DATE OF DISCHARGE:  10/10/2004                                 DISCHARGE SUMMARY   DISCHARGE DIAGNOSES:  1.  Syncope, to rule out cardiac and neurogenic causes.  2.  Right-sided meningioma.  3.  Hepatitis C.  4.  Idiopathic thrombocytopenic purpura.  5.  Depression.   DISCHARGE MEDICATIONS:  1.  Celexa 40 mg once daily.  2.  Xanax 0.5 mg q.6h.  3.  Interferon as per previous physician's recommendations.  4.  Ribavarin as per previous physician's recommendations.   DISPOSITION AND FOLLOW-UP:  1.  The patient needs to be followed up in the [facility]. She has been scheduled      for a sleep-deprived EEG to rule out focal seizures on August 29th and      she needs to be followed up with that.  2.  If her syncopal episodes recur, then she needs to be considered for: (A)      Loop monitor; (B) Tilt table test.   PROCEDURE PERFORMED:  1.  CT scan of the head showed no evidence of intracranial hemorrhage, brain      edema, or mass effect. There was no extra-axial fluid collections or      masses identified and no focal abnormalities. Impression: Negative      noncontrast head CT.  2.  MRI of the brain and neck revealed no evidence of hemodynamically      significant stenosis involving either the carotid bifurcation. There was      an extra-axial homogeneously enhancing round lesion in the cisternal      aspect of the right 7th and 8th cranial nerves at the level of the right      hydrocephalus suggestive of meningioma without surrounding vasogenic      edema.  3.  An echo showed an ejection fraction between 55% to 65%. Left ventricular      wall thickness was mildly to moderately increased, and aortic valve was     mildly calcified. There was mild aortic root dilatation and the left      atrium was mildly enlarged.   CONSULTATIONS:  None.   BRIEF ADMISSION HISTORY & PHYSICAL EXAM WITH ADMISSION LABS:  [name] is  a 60 year old white female with a past medical history significant for  hepatitis C and on interferon and ribavirin, and her idiopathic  thrombocytopenic purpura presented with a history of syncope that occurred  four days ago.  She was cleaning the bathroom, she felt dizzy, and blacked  out. No history of incontinence. No seizures. She got up after one to two  minutes and there were no witnesses. She has had similar episodes five  months ago and also three months ago. She does not give a history of  headache, blurred vision, numbness or weakness of any  side of the body. She  also gives a history of shortness of breath especially by climbing stairs  and some palpitations. Upon examination her temperature was 97.3, pulse 70,  respiratory rate 18, BP 147/81, O2 saturation 98%. Her orthostatics were  lying 102/61 with pulse of 72 and sitting 129/88 with pulse of 92, and  standing 144/104 with pulse of 102. On general exam, she was alert,  oriented, and lying in bed. Cardiovascular exam revealed a regular rate and  rhythm with S1 and S2 present. No murmurs, rubs, or gallops. Respiratory  examination revealed good air entry bilaterally. No crackles or wheezes.  Neurologic examination revealed cranial nerves II-XII intact. Motor strength  is 5/5 in all limbs. Sensory system intact. Cerebellar signs absent.  Extremities reveal no peripheral edema. Abdominal exam reveals bowel sounds  present, nontender, and nondistended.   Admission labs revealed WBC of 2.4, hemoglobin 10.2, hematocrit 29.9,  platelet count  62,000. Sodium 140, potassium 3.7, chloride 107, bicarbonate  26, BUN 13, creatinine 0.8, and glucose 101. Bilirubin 1, alkaline  phosphatase 84, SGOT 14, SGPT 95, total protein 7.1,  albumin 3.8, calcium  9.7.   HOSPITAL COURSE:   PROBLEM #1:  Syncope to rule out cardiac and neurologic causes. The patient  presented with syncope on at least three occasions during the last five  months. She was vigorously investigated for different causes. The echo did  not reveal any cardiac abnormalities. Ejection fraction was 55% to 65% and  no feature suggestive of cardiomyopathy. Pulmonary embolism was another  consideration which was ruled out by D-dimer levels of 0.35 and an ABG which  showed a pH of 7.431, PCO2 35, PO2 106, bicarbonate 23.4.  Her MRI and MRA  of the head and neck region did not show significant stenosis involving the  carotid arteries or vertebrobasilar circulation. There was a small  meningioma on the right side between the 7th and 8th cranial nerves that  could serve as an unusual seizure nidus.  An EEG was done which was negative  for seizure activity. A sleep-deprived EEG will be done in one month to more  thoroughly rule out focal seizures. If the patient's syncope continues she  may need a loop monitor or a tilt table test.   PROBLEM #2:  Meningioma was an incidental finding. MRA showed that she had a  meningioma in the cisternal aspect of the right 7th and 8th cranial nerves  at the level of the right-sided sulcus. There was no surrounding vasogenic  edema. An EEG was done to rule out focal seizures which could have been due  to the meningioma, but that was negative and she was scheduled for a sleep-  deprived EEG in one month's time.   PROBLEM #3:  Pancytopenia. She had a white blood cell count of 2.4,  hemoglobin 10.2, hematocrit 29.9, platelet count 62,000. These values have  been stable for some time and the most likely cause for the pancytopenia  could be due to her interferon and ribavirin. The patient needs to be  monitored by her physician for any worsening of the pancytopenia.  PROBLEM #4:  Hepatitis C.  The patient is being seen by an  outside doctor  for hepatitis C and she is getting interferon and ribavirin therapy for her  hepatitis C.   DISCHARGE LABORATORIES:  Her CBC revealed white blood cell count of 1.7,  hemoglobin 9.8, hematocrit 29.2, platelet count 57,000. Electrolytes  revealed sodium of 140, potassium 3.8, chloride 109,  CO2 23, glucose 105,  BUN 14, creatinine 0.8, bilirubin 0.6, alkaline phosphatase 66, SGOT 39,  SGPT 32, total protein 6.7, albumin 3.5, and calcium 9.4.  Lipid profile  revealed cholesterol of 160, triglycerides 208, HDL 28, LDL 90, and VLDL 42.  Pathologic smear of her blood showed pancytopenia with polychromasia and  rare teardrop cells. No circulating erythroblasts or leukoblasts. Urine  culture showed 10,000 colonies per mL. Her vitamin B12 level was 479 and her  folate was 10.6.      Alver Fisher, M.D.      Thomes Lolling, M.D.  Electronically Signed    YB/MEDQ  D:  10/11/2004  T:  10/11/2004  Job:  [id number]

## 2010-08-02 NOTE — Op Note (Signed)
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   PROCEDURE:  1. Removal antibiotic spacer.  2. Irrigation debridement, right knee.  3. Revision right total knee arthroplasty with a revision #3 femur,      revision #3 tibia, 25-mm poly tray.   SURGEON:  Newt Minion, MD   ANESTHESIA:  General.   ESTIMATED BLOOD LOSS:  Minimal.   ANTIBIOTICS:  1 gram of Kefzol.   DRAINS:  One Hemovac.   TOURNIQUET TIME:  83 minutes at 300 mmHg.   DISPOSITION:  To PACU in stable condition.   INDICATIONS FOR PROCEDURE:  The patient is a 60 year old woman who  developed a septic arthritis and a total knee arthroplasty.  The patient  initially underwent a two-stage procedure with removal of the components  and placement of antibiotic spacer and then placed on IV antibiotics.  Her cultures remained negative and after IV antibiotics with vancomycin  and placement antibiotic spacer with resolution of the cellulitis, the  patient presents at this time for revision total knee arthroplasty.  Risks and benefits were discussed including infection, neurovascular  injury, persistent pain and recurrent infection, neurovascular injury,  DVT, pulmonary embolus, need for additional surgery.  The patient states  he understands and wished to proceed at this time.   DESCRIPTION OF PROCEDURE:  The patient was brought to OR room 4 and  underwent a  general anesthetic.  After adequate anesthesia obtained, the  patient's right lower extremity was prepped using DuraPrep, draped into  a sterile field.  [name] was used cover all exposed skin.  Her old  midline incision was used and this was carried down to a medial  parapatellar incision.  The antibiotic spacer was removed and the knee  was irrigated with pulse lavage.  There was no purulence, no necrotic  tissue, no evidence of infection.  Both canals were sequentially hand  reamed up to a size 16.  Attention was first focused on the tibia.  With  the reamer left in place, the tibia surface was recut with the IM guide.  This was sized for size 3 and this was then broached and reamed for the  size 3 revision tibial tray.  The tray was left in place and the  attention was then focused on the femur.  Using the IM guide in the  femur, this was then was set to take 2 mm off the distal aspect of the  femur.  This was sized for 3, the cutting block was cut for  the  posterior and anterior cuts for the size 3 and then the size 3 cutting  block was placed and chamfer cuts as well as the box cut was made for  the size 3 femur.  The trial size 3 with the stem was placed and the  knee was sequentially tried up to 25 mm of poly thickness.  This had  good stability with varus and valgus, good flexion and good extension.  The patient had very been patellar shell left and the patella had a  smooth surface and this was not a resurfaced due to the risk of patella  fracture or failure.  The wounds were then irrigated again with normal  saline and the cement was mixed and the tibial and femoral component was  cemented in place with the knee extended.  The tray was placed.  All  loose cement was removed.  The knee was left in extension until the  cement had hardened.  A lateral release was performed to centralize the  patella.  After the cement hardened, the knee was placed through full  range of motion.   There was no instability.  The patella tracked  midline.  The retinacular incision was closed using #1 Vicryl.  Subcu  was closed with 2-0 Vicryl.  Skin was closed using Proximate staples.  The knee had stable range of motion from 0 to 90 degrees.  The  tourniquet was released after 83 minutes.  A medium Hemovac was placed  deep within the wound.  The wound was covered Adaptic orthopedic  sponges, ABD dressing, Webril and a Coban dressing.  The patient was  extubated, taken to PACU in stable condition.  The knee and soft tissue  were injected with a total of 30 mL of quarter percent Marcaine plain.  The patient was taken to PACU in stable condition.      Newt Minion, MD  Electronically Signed     MVD/MEDQ  D:  05/06/2006  T:  05/06/2006  Job:  [id number]

## 2010-08-02 NOTE — Op Note (Signed)
[name], [name]               ACCOUNT NO.:  [id number]   MEDICAL RECORD NO.:  [id number]          PATIENT TYPE:  INP   LOCATION:  2627                         FACILITY:  [facility]   PHYSICIAN:  Newt Minion, MD     DATE OF BIRTH:  November 20, 1950   DATE OF PROCEDURE:  03/26/2006  DATE OF DISCHARGE:                               OPERATIVE REPORT   PREOPERATIVE DIAGNOSIS:  Infected right total knee arthroplasty.   POSTOPERATIVE DIAGNOSIS:  Infected right total knee arthroplasty.   PROCEDURE:  1. Removal of total knee arthroplasty.  2. Irrigation and debridement of skin, soft tissue and bone with      removal of cement.  3. Placement of antibiotic spacer with vancomycin and tobramycin.   SURGEON:  Newt Minion, MD   ANESTHESIA:  General.   ESTIMATED BLOOD LOSS:  Minimal.   ANTIBIOTICS:  Preop therapeutic antibiotics.   TOURNIQUET TIME:  52 minutes at 300 mmHg.   DRAINS:  One large Hemovac.   DISPOSITION:  To PACU in stable condition.   INDICATIONS FOR PROCEDURE:  This patient is a 60 year old woman who is  status post a right total knee approximately six months ago in August.  The patient subsequent to that time has had two hospitalizations for  systemic infections either viral or bacterial in October where she was  hospitalized.  Recently the patient developed some increasing redness,  swelling and pain in the right knee.  Recently aspiration from the knee  showed no bacteria. Arthroscopic debridement also showed no bacteria;  however, the patient has had progressive increasing swelling and redness  over the incision.  All cultures were negative.  Bone scan shows  increased uptake in the tibia and femur and patella, all symmetrically  increased uptake with no evidence of any focal infection.  Her white  blood cell count has been normal at 4000.  Due to the patient's  increasing pain and swelling despite normal laboratory studies and  normal cultures, the patient is  brought at this time for urgent surgical  intervention.  Risks and benefits were discussed including persistent  infection, neurovascular injury, fracture of the bone, and need for  several additional surgeries.  The patient states she understands and  wished proceed at this time.   DESCRIPTION OF PROCEDURE:  The patient was brought to OR room 5 and  underwent a general anesthetic.  After adequate level of anesthesia  obtained, the patient's right lower extremity was prepped using  DuraPrep, draped in a sterile field.  [name] was used to cover all  exposed skin.  A surgical incision was incised and a medial parapatellar  retinacular incision was performed.  There was purulent material within  the joint.  This was not present on arthroscopy and not present with  previous aspiration.  This was debrided.  A synovectomy was performed to  clean all tissue from around the joint.  The patellar, femoral and  tibial components were removed.  All cement was removed.  The wound was  irrigated with pulse lavage.  After thorough debridement, an antibiotic  spacer was made  out of Palacos cement with 1 gram of tobramycin and 1  gram of vancomycin and 1.2 grams of tobramycin.  This was placed with  the knee in slight flexion.  The deep retinacular layer was closed using  #1 PDS.  The subcu was closed using 0 PDS.  A large Hemovac was placed  deep within the joint.  The skin was closed using Proximate staples.  Wound was covered with Adaptic orthopedic sponges, sterile Webril and  Coban dressing.  The patient was placed with an EZ ice pack as well as a  knee immobilizer.  Padding was placed proximally and distally in the  posterior aspect to minimize any skin breakdown.  The patient was  extubated, taken to PACU in stable condition.  Plan for 6 weeks IV  antibiotics and repeat surgical intervention for placement of revision  total knee.      Newt Minion, MD  Electronically Signed     MVD/MEDQ   D:  03/26/2006  T:  03/27/2006  Job:  [id number]

## 2010-08-02 NOTE — Procedures (Signed)
EEG NUMBER:  [id number]   HISTORY:  This is a 60 year old patient who is having episodes of dizziness  and loss of consciousness who is being evaluated for seizure events. Last  event was a month ago. This is a sleep-deprived EEG recording. No skull  defects are noted. Medications include Zoloft, labetalol.   EEG CLASSIFICATION:  Normal awake and drowsy.   DESCRIPTION OF RECORDING:  The background rhythm of this recording consists  of a fairly well modulated medium amplitude alpha rhythm of 10 Hz that is  reactive to eye opening and closure. As the record progresses, the patient  appears to remain in the waking state initially but at times appears to  become drowsy but never enters stage 2 sleep at any time during the  recording. Photic stimulation is performed resulting in a bilateral and  symmetric photic driving response but hyperventilation was not performed. At  no time during the recording does there appear to be evidence of spike or  spike wave discharges, or evidence of focal slowing. EKG monitor shows no  evidence of cardiac rhythm abnormalities with a heart rate of 72.   IMPRESSION:  This is a normal sleep-deprived EEG recording in the awake and  drowsy state. No evidence of ictal or interictal discharges are seen.      Jill Alexanders, M.D.  Electronically Signed     WFU:XNAT  D:  11/13/2004 10:41:11  T:  11/13/2004 11:37:36  Job #:  [id number]

## 2010-09-09 ENCOUNTER — Ambulatory Visit (INDEPENDENT_AMBULATORY_CARE_PROVIDER_SITE_OTHER): Payer: Medicare Other | Admitting: Psychology

## 2010-09-09 DIAGNOSIS — F329 Major depressive disorder, single episode, unspecified: Secondary | ICD-10-CM

## 2010-09-09 DIAGNOSIS — F411 Generalized anxiety disorder: Secondary | ICD-10-CM

## 2010-09-30 ENCOUNTER — Encounter (INDEPENDENT_AMBULATORY_CARE_PROVIDER_SITE_OTHER): Payer: Medicare Other | Admitting: Psychology

## 2010-09-30 DIAGNOSIS — F411 Generalized anxiety disorder: Secondary | ICD-10-CM

## 2010-09-30 DIAGNOSIS — F329 Major depressive disorder, single episode, unspecified: Secondary | ICD-10-CM

## 2010-09-30 DIAGNOSIS — F3289 Other specified depressive episodes: Secondary | ICD-10-CM

## 2010-10-04 ENCOUNTER — Ambulatory Visit (INDEPENDENT_AMBULATORY_CARE_PROVIDER_SITE_OTHER): Payer: Medicare Other | Admitting: Psychiatry

## 2010-10-04 DIAGNOSIS — F411 Generalized anxiety disorder: Secondary | ICD-10-CM

## 2010-10-04 DIAGNOSIS — F331 Major depressive disorder, recurrent, moderate: Secondary | ICD-10-CM

## 2010-10-21 ENCOUNTER — Encounter (HOSPITAL_COMMUNITY): Payer: Medicare Other | Admitting: Psychology

## 2010-10-23 ENCOUNTER — Encounter (HOSPITAL_COMMUNITY): Payer: Medicare Other | Admitting: Psychology

## 2010-11-01 ENCOUNTER — Encounter (INDEPENDENT_AMBULATORY_CARE_PROVIDER_SITE_OTHER): Payer: Medicare Other | Admitting: Psychology

## 2010-11-01 DIAGNOSIS — F329 Major depressive disorder, single episode, unspecified: Secondary | ICD-10-CM

## 2010-11-01 DIAGNOSIS — F411 Generalized anxiety disorder: Secondary | ICD-10-CM

## 2010-11-15 ENCOUNTER — Encounter (HOSPITAL_COMMUNITY): Payer: Medicare Other | Admitting: Psychiatry

## 2010-11-26 ENCOUNTER — Encounter (HOSPITAL_COMMUNITY): Payer: Medicare Other | Admitting: Psychology

## 2010-11-29 ENCOUNTER — Encounter (INDEPENDENT_AMBULATORY_CARE_PROVIDER_SITE_OTHER): Payer: Medicare Other | Admitting: Psychiatry

## 2010-11-29 DIAGNOSIS — F411 Generalized anxiety disorder: Secondary | ICD-10-CM

## 2011-01-24 ENCOUNTER — Encounter (HOSPITAL_COMMUNITY): Payer: Medicare Other | Admitting: Psychiatry

## 2011-01-28 ENCOUNTER — Encounter (HOSPITAL_COMMUNITY): Payer: Self-pay | Admitting: Psychiatry

## 2011-01-31 ENCOUNTER — Ambulatory Visit (HOSPITAL_COMMUNITY): Payer: Medicare Other | Admitting: Psychiatry

## 2011-01-31 ENCOUNTER — Encounter (HOSPITAL_COMMUNITY): Payer: Medicare Other | Admitting: Psychiatry

## 2011-01-31 ENCOUNTER — Encounter (HOSPITAL_COMMUNITY): Payer: Self-pay | Admitting: *Deleted

## 2011-02-03 ENCOUNTER — Encounter (HOSPITAL_COMMUNITY): Payer: Self-pay | Admitting: *Deleted

## 2011-02-15 ENCOUNTER — Other Ambulatory Visit (HOSPITAL_COMMUNITY): Payer: Self-pay

## 2011-05-30 ENCOUNTER — Ambulatory Visit (INDEPENDENT_AMBULATORY_CARE_PROVIDER_SITE_OTHER): Payer: Medicare Other | Admitting: Psychiatry

## 2011-05-30 DIAGNOSIS — F329 Major depressive disorder, single episode, unspecified: Secondary | ICD-10-CM

## 2011-05-30 NOTE — Progress Notes (Signed)
Patient ID: KLEA NALL, female   DOB: 06/19/1950, 61 y.o.   MRN: [id number] Recent surgery for diverticulitis with colectomy.  Had exacerbation of depression and anxiety in hospital.  Sleep and appetite not good.  Mood down in dumps.  Panic attacks more frequent.  Drinking not recurring.  No suicidal feelings.  Only taking trazadone to help with sleep.  Needs return of celexa 20 mg. And Xanax 48m. Tid.

## 2011-06-27 ENCOUNTER — Ambulatory Visit (HOSPITAL_COMMUNITY): Payer: Self-pay | Admitting: Psychiatry

## 2011-07-11 ENCOUNTER — Ambulatory Visit (HOSPITAL_COMMUNITY): Payer: Self-pay | Admitting: Psychiatry

## 2011-10-31 ENCOUNTER — Ambulatory Visit (INDEPENDENT_AMBULATORY_CARE_PROVIDER_SITE_OTHER): Payer: Medicare Other | Admitting: Psychiatry

## 2011-10-31 DIAGNOSIS — F339 Major depressive disorder, recurrent, unspecified: Secondary | ICD-10-CM

## 2011-10-31 MED ORDER — CITALOPRAM HYDROBROMIDE 20 MG PO TABS: 20.0000 mg | ORAL_TABLET | Freq: Every day | ORAL | Status: DC

## 2011-10-31 MED ORDER — ALPRAZOLAM 1 MG PO TABS: 1.0000 mg | ORAL_TABLET | Freq: Three times a day (TID) | ORAL | Status: DC | PRN

## 2011-10-31 MED ORDER — TRAZODONE HCL 50 MG PO TABS: 50.0000 mg | ORAL_TABLET | Freq: Every day | ORAL | Status: DC

## 2011-10-31 NOTE — Progress Notes (Signed)
Patient ID: [name], female   DOB: 03-May-1950, 61 y.o.   MRN: [id number] Continues to have GI problems.  Feels xanax helps with these problems.  Sleep 3 to 4 hr. Night.  Mood has been down with lots of anxiety.  No more surgery.  Taking meds regularly.

## 2011-10-31 NOTE — Addendum Note (Signed)
Addended by: Toy Cookey, DAVID L on: 10/31/2011 11:25 AM   Modules accepted: Orders

## 2012-02-20 ENCOUNTER — Ambulatory Visit (HOSPITAL_COMMUNITY): Payer: Self-pay | Admitting: Psychiatry

## 2012-02-27 ENCOUNTER — Encounter (HOSPITAL_COMMUNITY): Payer: Self-pay | Admitting: Psychiatry

## 2012-02-27 ENCOUNTER — Ambulatory Visit (INDEPENDENT_AMBULATORY_CARE_PROVIDER_SITE_OTHER): Payer: Medicare Other | Admitting: Psychiatry

## 2012-02-27 VITALS — BP 148/88 | HR 64 | Ht 64.0 in | Wt 214.6 lb

## 2012-02-27 DIAGNOSIS — M129 Arthropathy, unspecified: Secondary | ICD-10-CM

## 2012-02-27 DIAGNOSIS — G4700 Insomnia, unspecified: Secondary | ICD-10-CM

## 2012-02-27 DIAGNOSIS — F329 Major depressive disorder, single episode, unspecified: Secondary | ICD-10-CM

## 2012-02-27 DIAGNOSIS — F3289 Other specified depressive episodes: Secondary | ICD-10-CM

## 2012-02-27 DIAGNOSIS — M1710 Unilateral primary osteoarthritis, unspecified knee: Secondary | ICD-10-CM

## 2012-02-27 DIAGNOSIS — IMO0002 Reserved for concepts with insufficient information to code with codable children: Secondary | ICD-10-CM

## 2012-02-27 DIAGNOSIS — F411 Generalized anxiety disorder: Secondary | ICD-10-CM

## 2012-02-27 DIAGNOSIS — F332 Major depressive disorder, recurrent severe without psychotic features: Secondary | ICD-10-CM

## 2012-02-27 MED ORDER — CITALOPRAM HYDROBROMIDE 20 MG PO TABS: 20.0000 mg | ORAL_TABLET | Freq: Every day | ORAL | Status: DC

## 2012-02-27 MED ORDER — ALPRAZOLAM 1 MG PO TABS: 1.0000 mg | ORAL_TABLET | Freq: Three times a day (TID) | ORAL | Status: DC | PRN

## 2012-02-27 MED ORDER — TRAZODONE HCL 50 MG PO TABS: 50.0000 mg | ORAL_TABLET | Freq: Every day | ORAL | Status: DC

## 2012-02-27 NOTE — Patient Instructions (Addendum)
Could use "Move Free" or "Osteo bi Flex" for arthritic pain.   The important ingredients are Chondrotin Sulfate and Glucosamine.  Tumeric is also helpful for arthritis.   Krill oil and cod liver oil may be helpful for arthritis.   You might try Almond butter.  Increase the omega 3 oil to TWICE a day.  For brain care and to prevent dementia, CUT BACK/CUT OUT on sugar and carbohydrates, that means very limited fruits and starchy vegetables and very limited grains, breads  The goal is low GLYCEMIC INDEX.  CUT OUT all wheat, rye, or barley  HIGH fat and LOW carbohydrate diet is the KEY.  Eat avocados, eggs, lean meat like grass fed beef and chicken  Nuts and seeds would be good foods as well.   To prevent dementia Play cards or broad games Learn a musical instrument Dancing Use the other hand to brush teeth or open doors Get regular exercise and read or study some new subject  Keep the walking up.  It is real important for your brain.   Ask your PCP about using INDERAL for blood pressure control, because it helps with controlling anxiety really well.   Have a happy holiday, SUNNY holiday.

## 2012-02-27 NOTE — Progress Notes (Signed)
[facility] MD Progress Note  02/27/2012 10:23 AM QUINITA KOSTELECKY  MRN:  [id number]  Chief Complaint: Chief Complaint  Patient presents with  . Depression  . Follow-up  . Establish Care  . Medication Refill   Subjective:  "I'm doping pretty good.  I was having anxiety attacks when I was still working, so I really enjoy how the Xanax helps control the attacks".  Discussion: Spoke with pt about her use of Xanax which may cause cloudy thinking, disinhibition, and depression.  She denies experiencing any such symptoms.  Discussed the prevention things she can do for dementia as her grandmother died with that.  She plans to go to [place] for 2 weeks to see her son.  She had not been there in over 10 years while her husband was so sick with Alzheimer's.   Diagnosis:   Axis I: Anxiety Disorder NOS and Major Depression, Recurrent severe Axis II: Deferred Axis III:  Past Medical History  Diagnosis Date  . Depression   . Anxiety    Axis IV: other psychosocial or environmental problems Axis V: 51-60 moderate symptoms  ADL's:  Intact  Sleep: Good  Appetite:  Good  Suicidal Ideation:  Pt denies any thoughts, plans, intent of suicide Homicidal Ideation:  Pt denies any thoughts, plans, intent of homicide AEB (as evidenced by):per pt report  Psychiatric Specialty Exam: ROS ROS: Neuro: no headaches, ataxia, weakness GI: no N/V/D/cramps/constipation MS: no weakness, muscle cramps, aches, but does have joint pain from arthritis.    Blood pressure 148/88, pulse 64, height 5' 4"  (1.626 m), weight 214 lb 9.6 oz (97.342 kg).Body mass index is 36.84 kg/(m^2).  General Appearance: Casual  Eye Contact::  Good  Speech:  Clear and Coherent  Volume:  Normal  Mood:  Euthymic  Affect:  Congruent  Thought Process:  Coherent, Intact and Logical  Orientation:  Full (Time, Place, and Person)  Thought Content:  WDL  Suicidal Thoughts:  No  Homicidal Thoughts:  No  Memory:  Immediate;   Fair Recent;    Fair Remote;   Fair  Judgement:  Good  Insight:  Good  Psychomotor Activity:  Normal  Concentration:  Good  Recall:  Good  Akathisia:  No  Handed:  Ambidextrous  AIMS (if indicated):     Assets:  Communication Skills Desire for Improvement  Sleep:      Current Medications: Current Outpatient Prescriptions  Medication Sig Dispense Refill  . ALPRAZolam (XANAX) 1 MG tablet Take 1 tablet (1 mg total) by mouth 3 (three) times daily as needed for sleep.  90 tablet  5  . citalopram (CELEXA) 20 MG tablet Take 1 tablet (20 mg total) by mouth daily.  30 tablet  2  . traZODone (DESYREL) 50 MG tablet Take 1 tablet (50 mg total) by mouth at bedtime.  30 tablet  12    Lab Results: No results found for this or any previous visit (from the past 48 hour(s)).  Physical Findings: AIMS:  , ,  ,  ,    CIWA:    COWS:     Treatment Plan Summary: Medication management  Plan: I took her vitals.  I reviewed CC, tobacco/med/surg Hx, meds effects/ side effects, problem list, therapies and responses as well as current situation/symptoms discussed options. See orders and pt instructions for more details.  Medical Decision Making Problem Points:  Established problem, stable/improving (1), Review of last therapy session (1), Review of psycho-social stressors (1) and Self-limited or minor (1) Data Points:  Discuss tests with performing physician (1) Review of medication regiment & side effects (2) Review of new medications or change in dosage (2)  I certify that outpatient services furnished can reasonably be expected to improve the patient's condition.   Jamiee Milholland 02/27/2012, 10:23 AM

## 2012-04-17 DIAGNOSIS — K5792 Diverticulitis of intestine, part unspecified, without perforation or abscess without bleeding: Secondary | ICD-10-CM

## 2012-04-17 HISTORY — DX: Diverticulitis of intestine, part unspecified, without perforation or abscess without bleeding: K57.92

## 2012-05-28 ENCOUNTER — Ambulatory Visit (HOSPITAL_COMMUNITY): Payer: Self-pay | Admitting: Psychiatry

## 2012-07-09 ENCOUNTER — Ambulatory Visit (HOSPITAL_COMMUNITY): Payer: Self-pay | Admitting: Psychiatry

## 2012-07-16 ENCOUNTER — Ambulatory Visit (HOSPITAL_COMMUNITY): Payer: Self-pay | Admitting: Psychiatry

## 2012-07-21 ENCOUNTER — Ambulatory Visit (INDEPENDENT_AMBULATORY_CARE_PROVIDER_SITE_OTHER): Payer: Medicare Other | Admitting: Psychiatry

## 2012-07-21 ENCOUNTER — Encounter (HOSPITAL_COMMUNITY): Payer: Self-pay | Admitting: Psychiatry

## 2012-07-21 VITALS — Ht 63.0 in | Wt 211.2 lb

## 2012-07-21 DIAGNOSIS — F332 Major depressive disorder, recurrent severe without psychotic features: Secondary | ICD-10-CM

## 2012-07-21 DIAGNOSIS — G4700 Insomnia, unspecified: Secondary | ICD-10-CM

## 2012-07-21 DIAGNOSIS — F411 Generalized anxiety disorder: Secondary | ICD-10-CM

## 2012-07-21 DIAGNOSIS — F329 Major depressive disorder, single episode, unspecified: Secondary | ICD-10-CM

## 2012-07-21 MED ORDER — CITALOPRAM HYDROBROMIDE 20 MG PO TABS: 20.0000 mg | ORAL_TABLET | Freq: Every day | ORAL | Status: DC

## 2012-07-21 MED ORDER — TRAZODONE HCL 50 MG PO TABS: 50.0000 mg | ORAL_TABLET | Freq: Every day | ORAL | Status: DC

## 2012-07-21 MED ORDER — ALPRAZOLAM 1 MG PO TABS: 1.0000 mg | ORAL_TABLET | Freq: Three times a day (TID) | ORAL | Status: DC | PRN

## 2012-07-21 NOTE — Patient Instructions (Signed)
Strongly consider restarting some therapy for the history issues.  [name] is an excellent therapist.  Take care of yourself.  No one else is standing up to do the job and only you know what you need.   GET SERIOUS about taking care of yourself.  Do the next right thing and that often means doing something to care for yourself along the lines of are you hungry, are you angry, are you lonely, are you tired, are you scared?  [facility] is what that stands for.  Call if problems or concerns.
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
## 2012-09-06 ENCOUNTER — Encounter (HOSPITAL_COMMUNITY): Payer: Self-pay | Admitting: Psychiatry

## 2012-09-06 ENCOUNTER — Ambulatory Visit (INDEPENDENT_AMBULATORY_CARE_PROVIDER_SITE_OTHER): Payer: Medicare Other | Admitting: Psychiatry

## 2012-09-06 VITALS — BP 140/88 | Ht 63.0 in | Wt 214.8 lb

## 2012-09-06 DIAGNOSIS — F329 Major depressive disorder, single episode, unspecified: Secondary | ICD-10-CM

## 2012-09-06 DIAGNOSIS — G4700 Insomnia, unspecified: Secondary | ICD-10-CM

## 2012-09-06 DIAGNOSIS — F411 Generalized anxiety disorder: Secondary | ICD-10-CM

## 2012-09-06 DIAGNOSIS — F332 Major depressive disorder, recurrent severe without psychotic features: Secondary | ICD-10-CM

## 2012-09-06 MED ORDER — ALPRAZOLAM 1 MG PO TABS: 1.0000 mg | ORAL_TABLET | Freq: Three times a day (TID) | ORAL | Status: DC | PRN

## 2012-09-06 MED ORDER — CITALOPRAM HYDROBROMIDE 20 MG PO TABS: 20.0000 mg | ORAL_TABLET | Freq: Every day | ORAL | Status: DC

## 2012-09-06 MED ORDER — TRAZODONE HCL 50 MG PO TABS: 50.0000 mg | ORAL_TABLET | Freq: Every day | ORAL | Status: DC

## 2012-09-06 NOTE — Patient Instructions (Signed)
Keep it up.  Get into some kind of creative pursuit as soon as you can.  Take care of yourself.  Call if problems or concerns.
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
## 2012-12-17 ENCOUNTER — Ambulatory Visit (HOSPITAL_COMMUNITY): Payer: Self-pay | Admitting: Psychiatry

## 2012-12-23 ENCOUNTER — Ambulatory Visit (INDEPENDENT_AMBULATORY_CARE_PROVIDER_SITE_OTHER): Payer: 59 | Admitting: Psychiatry

## 2012-12-23 ENCOUNTER — Encounter (HOSPITAL_COMMUNITY): Payer: Self-pay | Admitting: Psychiatry

## 2012-12-23 VITALS — BP 140/90 | Ht 63.0 in | Wt 206.0 lb

## 2012-12-23 DIAGNOSIS — G4700 Insomnia, unspecified: Secondary | ICD-10-CM

## 2012-12-23 DIAGNOSIS — F332 Major depressive disorder, recurrent severe without psychotic features: Secondary | ICD-10-CM

## 2012-12-23 DIAGNOSIS — F329 Major depressive disorder, single episode, unspecified: Secondary | ICD-10-CM

## 2012-12-23 DIAGNOSIS — F411 Generalized anxiety disorder: Secondary | ICD-10-CM

## 2012-12-23 MED ORDER — ALPRAZOLAM 1 MG PO TABS: 1.0000 mg | ORAL_TABLET | Freq: Three times a day (TID) | ORAL | Status: DC | PRN

## 2012-12-23 MED ORDER — TRAZODONE HCL 50 MG PO TABS: 50.0000 mg | ORAL_TABLET | Freq: Every day | ORAL | Status: DC

## 2012-12-23 MED ORDER — CITALOPRAM HYDROBROMIDE 20 MG PO TABS: 20.0000 mg | ORAL_TABLET | Freq: Every day | ORAL | Status: DC

## 2012-12-23 NOTE — Progress Notes (Signed)
Patient ID: [name], female   DOB: 10/07/1950, 62 y.o.   MRN: [id number] [facility] [id number] Progress Note [name] MRN: [id number] DOB: [birth year] Age: 62 y.o.  Date: 12/23/2012 Start Time: 2:30 PM End Time: 2:45 PM  Chief Complaint: Chief Complaint  Patient presents with  . Anxiety  . Depression  . Follow-up    Subjective:  "I've had a lot of stress on the lately."  This patient is a 62 year old married white female. She's currently living alone because her husband is in a nursing home in [place]. She resides in [place]. She has 2 grown children-her daughter is in prison for substance possession in her son lives in [place]. She is on disability.  The patient has a long history of depression and anxiety. She generally does pretty well but she's had some bad news lately. Her primary doctors think she has hepatitis C. She went to a GI specialist today who drew a lot of labs. Her platelets are low and her spleen is enlarged but she's not sure what's going to happen from here. She does feel like her psychiatric medications have been helpful. She sleeping pretty well. Her husband is in the end stages of dementia and may die soon and this is on her mind as well. Given all these stressors she's holding up well.  Allergies: No Known Allergies Medical History: Past Medical History  Diagnosis Date  . Depression   . Anxiety   . Splenomegaly   . Hypertension   . COPD (chronic obstructive pulmonary disease)   Surgical History: Past Surgical History  Procedure Laterality Date  . Abdominal hysterectomy    . Joint replacement    . Colon surgery    Family History family history includes Alcohol abuse in her father and paternal uncle; Anxiety disorder in her daughter; Bipolar disorder in her daughter; Dementia in her father and maternal grandmother; Depression in her brother and paternal uncle; Drug abuse in her brother. There is no history of ADD / ADHD. Reviewed again  with no changes noted. Diagnosis:   Axis I: Anxiety Disorder NOS and Major Depression, Recurrent severe Axis II: Deferred Axis III:  Past Medical History  Diagnosis Date  . Depression   . Anxiety   . Splenomegaly   . Hypertension   . COPD (chronic obstructive pulmonary disease)    Axis IV: other psychosocial or environmental problems Axis V: 51-60 moderate symptoms  ADL's:  Intact  Sleep: Good  Appetite:  Good  Suicidal Ideation:  Pt denies any thoughts, plans, intent of suicide Homicidal Ideation:  Pt denies any thoughts, plans, intent of homicide AEB (as evidenced by):per pt report  Psychiatric Specialty Exam: Review of Systems  Gastrointestinal: Positive for abdominal pain.  Musculoskeletal: Positive for joint pain.  Psychiatric/Behavioral: The patient is nervous/anxious.    ROS: Neuro: no headaches, ataxia, weakness GI: no N/V/D/cramps/constipation MS: no weakness, muscle cramps, aches, but does have joint pain from arthritis.    Blood pressure 140/90, height 5' 3"  (1.6 m), weight 206 lb (93.441 kg).Body mass index is 36.5 kg/(m^2).  General Appearance: Casual  Eye Contact::  Good  Speech:  Clear and Coherent  Volume:  Normal  Mood:  Sad and tearful today, worried   Affect:  Congruent  Thought Process:  Coherent, Intact and Logical  Orientation:  Full (Time, Place, and Person)  Thought Content:  WDL  Suicidal Thoughts:  No  Homicidal Thoughts:  No  Memory:  Immediate;   Fair Recent;  Fair Remote;   Fair  Judgement:  Good  Insight:  Good  Psychomotor Activity:  Normal  Concentration:  Good  Recall:  Good  Akathisia:  No  Handed:  Ambidextrous  AIMS (if indicated):     Assets:  Communication Skills Desire for Improvement  Sleep:      Current Medications: Current Outpatient Prescriptions  Medication Sig Dispense Refill  . ALPRAZolam (XANAX) 1 MG tablet Take 1 tablet (1 mg total) by mouth 3 (three) times daily as needed for anxiety (insomnia).  90  tablet  2  . citalopram (CELEXA) 20 MG tablet Take 1 tablet (20 mg total) by mouth daily.  30 tablet  2  . ranitidine (ZANTAC) 300 MG capsule Take 300 mg by mouth every evening.      . traZODone (DESYREL) 50 MG tablet Take 1 tablet (50 mg total) by mouth at bedtime.  30 tablet  2   No current facility-administered medications for this visit.  Lab Results: No results found for this or any previous visit (from the past 4032 hour(s)). PCP draws routine labs and nothing is emerging as of concern. Physical Findings: AIMS:  , ,  ,  ,    CIWA:    COWS:    Plan: I took her vitals.  I reviewed CC, tobacco/med/surg Hx, meds effects/ side effects, problem list, therapies and responses as well as current situation/symptoms discussed options. Continue current effective medications. She'll return in 3 months but call if she needs to come in sooner See orders and pt instructions for more details. Meds ordered this encounter  Medications  . ranitidine (ZANTAC) 300 MG capsule    Sig: Take 300 mg by mouth every evening.  Marland Kitchen DISCONTD: ALPRAZolam (XANAX) 1 MG tablet    Sig: Take 1 tablet (1 mg total) by mouth 3 (three) times daily as needed for anxiety (insomnia).    Dispense:  90 tablet    Refill:  2  . citalopram (CELEXA) 20 MG tablet    Sig: Take 1 tablet (20 mg total) by mouth daily.    Dispense:  30 tablet    Refill:  2  . traZODone (DESYREL) 50 MG tablet    Sig: Take 1 tablet (50 mg total) by mouth at bedtime.    Dispense:  30 tablet    Refill:  2  . ALPRAZolam (XANAX) 1 MG tablet    Sig: Take 1 tablet (1 mg total) by mouth 3 (three) times daily as needed for anxiety (insomnia).    Dispense:  90 tablet    Refill:  2  Medical Decision Making Problem Points:  Established problem, stable/improving (1), Review of last therapy session (1) and Review of psycho-social stressors (1) Data Points:  Review of medication regiment & side effects (2) I certify that outpatient services furnished can  reasonably be expected to improve the patient's condition.  Afua Hoots, [facility] 12/23/2012, 4:53 PM

## 2013-02-18 ENCOUNTER — Other Ambulatory Visit (HOSPITAL_COMMUNITY): Payer: Self-pay | Admitting: Psychiatry

## 2013-02-21 ENCOUNTER — Other Ambulatory Visit (HOSPITAL_COMMUNITY): Payer: Self-pay | Admitting: Psychiatry

## 2013-02-22 ENCOUNTER — Telehealth (HOSPITAL_COMMUNITY): Payer: Self-pay | Admitting: Psychiatry

## 2013-02-22 NOTE — Telephone Encounter (Signed)
Pt needed refill on trazodone-done

## 2013-03-25 ENCOUNTER — Ambulatory Visit (HOSPITAL_COMMUNITY): Payer: Self-pay | Admitting: Psychiatry

## 2013-04-05 ENCOUNTER — Encounter (HOSPITAL_COMMUNITY): Payer: Self-pay | Admitting: Psychiatry

## 2013-04-05 ENCOUNTER — Ambulatory Visit (INDEPENDENT_AMBULATORY_CARE_PROVIDER_SITE_OTHER): Payer: 59 | Admitting: Psychiatry

## 2013-04-05 VITALS — BP 120/80 | Ht 63.0 in | Wt 200.0 lb

## 2013-04-05 DIAGNOSIS — F329 Major depressive disorder, single episode, unspecified: Secondary | ICD-10-CM

## 2013-04-05 DIAGNOSIS — F3289 Other specified depressive episodes: Secondary | ICD-10-CM

## 2013-04-05 DIAGNOSIS — F411 Generalized anxiety disorder: Secondary | ICD-10-CM

## 2013-04-05 DIAGNOSIS — G4700 Insomnia, unspecified: Secondary | ICD-10-CM

## 2013-04-05 MED ORDER — ALPRAZOLAM 1 MG PO TABS: 1.0000 mg | ORAL_TABLET | Freq: Four times a day (QID) | ORAL | Status: DC

## 2013-04-05 MED ORDER — TRAZODONE HCL 50 MG PO TABS: 50.0000 mg | ORAL_TABLET | Freq: Every day | ORAL | Status: DC

## 2013-04-05 MED ORDER — ALPRAZOLAM 1 MG PO TABS: 1.0000 mg | ORAL_TABLET | Freq: Three times a day (TID) | ORAL | Status: DC | PRN

## 2013-04-05 MED ORDER — CITALOPRAM HYDROBROMIDE 20 MG PO TABS: 20.0000 mg | ORAL_TABLET | Freq: Every day | ORAL | Status: DC

## 2013-04-05 NOTE — Progress Notes (Signed)
Patient ID: [name], female   DOB: 30-Nov-1950, 63 y.o.   MRN: [id number] Patient ID: [name], female   DOB: 08/17/1950, 63 y.o.   MRN: [id number] [facility] [id number] Progress Note [name] MRN: [id number] DOB: 1950/07/08 Age: 63 y.o.  Date: 04/05/2013 Start Time: 2:30 PM End Time: 2:45 PM  Chief Complaint: Chief Complaint  Patient presents with  . Anxiety  . Depression  . Follow-up    Subjective:  "I've had a lot of stress on  lately."  This patient is a 63 year old married white female. She's currently living alone because her husband is in a nursing home in [place]. She resides in [place]. She has 2 grown children-her daughter is in prison for substance possession in her son lives in [place]. She is on disability.  The patient has a long history of depression and anxiety. She generally does pretty well but she's had some bad news lately. Her primary doctors think she has hepatitis C. She went to a GI specialist today who drew a lot of labs. Her platelets are low and her spleen is enlarged but she's not sure what's going to happen from here. She does feel like her psychiatric medications have been helpful. She sleeping pretty well. Her husband is in the end stages of dementia and may die soon and this is on her mind as well. Given all these stressors she's holding up well.  The patient returns after 3 months. She is getting a new form of treatment for hepatitis C. She doesn't know the name of the drug but it is making her more anxious and irritable. She often has to take an extra Xanax and she is running out early. She will be done with the medicine in mid February. I told her we could increase it until then. Her mood is holding up fairly well despite all of these changes. She's having some trouble sleeping at times. There's been no change in her husband status  Allergies: No Known Allergies Medical History: Past Medical History  Diagnosis Date  . Depression    . Anxiety   . Splenomegaly   . Hypertension   . COPD (chronic obstructive pulmonary disease)   . Asthma   . Hepatitis C   Surgical History: Past Surgical History  Procedure Laterality Date  . Abdominal hysterectomy    . Joint replacement    . Colon surgery    Family History family history includes Alcohol abuse in her father and paternal uncle; Anxiety disorder in her daughter; Bipolar disorder in her daughter; Dementia in her father and maternal grandmother; Depression in her brother and paternal uncle; Drug abuse in her brother. There is no history of ADD / ADHD. Reviewed again with no changes noted. Diagnosis:   Axis I: Anxiety Disorder NOS and Major Depression, Recurrent severe Axis II: Deferred Axis III:  Past Medical History  Diagnosis Date  . Depression   . Anxiety   . Splenomegaly   . Hypertension   . COPD (chronic obstructive pulmonary disease)   . Asthma   . Hepatitis C    Axis IV: other psychosocial or environmental problems Axis V: 51-60 moderate symptoms  ADL's:  Intact  Sleep: Good  Appetite:  Good  Suicidal Ideation:  Pt denies any thoughts, plans, intent of suicide Homicidal Ideation:  Pt denies any thoughts, plans, intent of homicide AEB (as evidenced by):per pt report  Psychiatric Specialty Exam: Review of Systems  Gastrointestinal: Positive for abdominal pain.  Musculoskeletal:  Positive for joint pain.  Psychiatric/Behavioral: The patient is nervous/anxious.    ROS: Neuro: no headaches, ataxia, weakness GI: no N/V/D/cramps/constipation MS: no weakness, muscle cramps, aches, but does have joint pain from arthritis.    Blood pressure 120/80, height 5' 3"  (1.6 m), weight 200 lb (90.719 kg).Body mass index is 35.44 kg/(m^2).  General Appearance: Casual  Eye Contact::  Good  Speech:  Clear and Coherent  Volume:  Normal  Mood:  Anxious   Affect:  Congruent  Thought Process:  Coherent, Intact and Logical  Orientation:  Full (Time, Place,  and Person)  Thought Content:  WDL  Suicidal Thoughts:  No  Homicidal Thoughts:  No  Memory:  Immediate;   Fair Recent;   Fair Remote;   Fair  Judgement:  Good  Insight:  Good  Psychomotor Activity:  Normal  Concentration:  Good  Recall:  Good  Akathisia:  No  Handed:  Ambidextrous  AIMS (if indicated):     Assets:  Communication Skills Desire for Improvement  Sleep:      Current Medications: Current Outpatient Prescriptions  Medication Sig Dispense Refill  . ALPRAZolam (XANAX) 1 MG tablet Take 1 tablet (1 mg total) by mouth 3 (three) times daily as needed for anxiety (insomnia).  90 tablet  2  . ALPRAZolam (XANAX) 1 MG tablet Take 1 tablet (1 mg total) by mouth 4 (four) times daily.  120 tablet  2  . citalopram (CELEXA) 20 MG tablet Take 1 tablet (20 mg total) by mouth daily.  30 tablet  2  . ranitidine (ZANTAC) 300 MG capsule Take 300 mg by mouth every evening.      . traZODone (DESYREL) 50 MG tablet Take 1 tablet (50 mg total) by mouth at bedtime.  30 tablet  2   No current facility-administered medications for this visit.  Lab Results: No results found for this or any previous visit (from the past 4032 hour(s)). PCP draws routine labs and nothing is emerging as of concern. Physical Findings: AIMS:  , ,  ,  ,    CIWA:    COWS:    Plan: I took her vitals.  I reviewed CC, tobacco/med/surg Hx, meds effects/ side effects, problem list, therapies and responses as well as current situation/symptoms discussed options. Continue current effective medications but increase Xanax to 1 mg 4 times a day. We will get her urge from her gastroenterologist to see exactly what she is taking She'll return in 2 months but call if she needs to come in sooner See orders and pt instructions for more details. Meds ordered this encounter  Medications  . citalopram (CELEXA) 20 MG tablet    Sig: Take 1 tablet (20 mg total) by mouth daily.    Dispense:  30 tablet    Refill:  2  . DISCONTD:  traZODone (DESYREL) 50 MG tablet    Sig: Take 1 tablet (50 mg total) by mouth at bedtime.    Dispense:  30 tablet    Refill:  2  . traZODone (DESYREL) 50 MG tablet    Sig: Take 1 tablet (50 mg total) by mouth at bedtime.    Dispense:  30 tablet    Refill:  2  . ALPRAZolam (XANAX) 1 MG tablet    Sig: Take 1 tablet (1 mg total) by mouth 3 (three) times daily as needed for anxiety (insomnia).    Dispense:  90 tablet    Refill:  2  . ALPRAZolam (XANAX) 1 MG tablet  Sig: Take 1 tablet (1 mg total) by mouth 4 (four) times daily.    Dispense:  120 tablet    Refill:  2  Medical Decision Making Problem Points:  Established problem, stable/improving (1), Review of last therapy session (1) and Review of psycho-social stressors (1) Data Points:  Review of medication regiment & side effects (2) I certify that outpatient services furnished can reasonably be expected to improve the patient's condition.  ROSS, [facility] 04/05/2013, 11:51 AM

## 2013-06-06 ENCOUNTER — Ambulatory Visit (HOSPITAL_COMMUNITY): Payer: Self-pay | Admitting: Psychiatry

## 2013-06-09 ENCOUNTER — Telehealth (HOSPITAL_COMMUNITY): Payer: Self-pay | Admitting: *Deleted

## 2013-06-10 ENCOUNTER — Ambulatory Visit (HOSPITAL_COMMUNITY): Payer: Self-pay | Admitting: Psychiatry

## 2013-06-24 ENCOUNTER — Ambulatory Visit (HOSPITAL_COMMUNITY): Payer: Self-pay | Admitting: Psychiatry

## 2013-06-24 ENCOUNTER — Encounter (HOSPITAL_COMMUNITY): Payer: Self-pay | Admitting: Psychiatry

## 2013-07-01 ENCOUNTER — Encounter (HOSPITAL_COMMUNITY): Payer: Self-pay | Admitting: Psychiatry

## 2013-07-01 ENCOUNTER — Ambulatory Visit (INDEPENDENT_AMBULATORY_CARE_PROVIDER_SITE_OTHER): Payer: 59 | Admitting: Psychiatry

## 2013-07-01 VITALS — BP 120/80 | Ht 63.0 in | Wt 197.0 lb

## 2013-07-01 DIAGNOSIS — G4700 Insomnia, unspecified: Secondary | ICD-10-CM

## 2013-07-01 DIAGNOSIS — F332 Major depressive disorder, recurrent severe without psychotic features: Secondary | ICD-10-CM

## 2013-07-01 DIAGNOSIS — F411 Generalized anxiety disorder: Secondary | ICD-10-CM

## 2013-07-01 DIAGNOSIS — F329 Major depressive disorder, single episode, unspecified: Secondary | ICD-10-CM

## 2013-07-01 DIAGNOSIS — F3289 Other specified depressive episodes: Secondary | ICD-10-CM

## 2013-07-01 MED ORDER — ALPRAZOLAM 1 MG PO TABS: 1.0000 mg | ORAL_TABLET | Freq: Four times a day (QID) | ORAL | Status: DC

## 2013-07-01 MED ORDER — CITALOPRAM HYDROBROMIDE 20 MG PO TABS: 20.0000 mg | ORAL_TABLET | Freq: Every day | ORAL | Status: DC

## 2013-07-01 MED ORDER — TRAZODONE HCL 100 MG PO TABS: 100.0000 mg | ORAL_TABLET | Freq: Every day | ORAL | Status: DC

## 2013-07-01 NOTE — Progress Notes (Signed)
Patient ID: [name], female   DOB: 07/29/1950, 63 y.o.   MRN: [id number] Patient ID: [name], female   DOB: Dec 18, 1950, 63 y.o.   MRN: [id number] Patient ID: [name], female   DOB: June 14, 1950, 63 y.o.   MRN: [id number] [facility] [id number] Progress Note [name] MRN: [id number] DOB: Sep 25, 1950 Age: 63 y.o.  Date: 07/01/2013 Start Time: 2:30 PM End Time: 2:45 PM  Chief Complaint: Chief Complaint  Patient presents with  . Anxiety  . Depression  . Follow-up    Subjective:  "I've had a lot of stress on  lately."  This patient is a 63 year old married white female. She's currently living alone because her husband is in a nursing home in [place]. She resides in [place]. She has 2 grown children-her daughter is in prison for substance possession in her son lives in [place]. She is on disability.  The patient has a long history of depression and anxiety. She generally does pretty well but she's had some bad news lately. Her primary doctors think she has hepatitis C. She went to a GI specialist today who drew a lot of labs. Her platelets are low and her spleen is enlarged but she's not sure what's going to happen from here. She does feel like her psychiatric medications have been helpful. She sleeping pretty well. Her husband is in the end stages of dementia and may die soon and this is on her mind as well. Given all these stressors she's holding up well.  The patient returns after 3 months. Many things have happened to her since. She's developed severe bronchitis and chest pain. She's had numerous cardiac studies at [facility]. She now has a cardiologist and she doesn't know the results of all the testing that was told that she has a "leaky valve". She's not any new medications. She's on prednisone which makes her hyper and agitated at night and she's unable to sleep.  Her husband passed away on Jun 16, 2022. She and his children arrange the funeral and that went well.  He had been in a nursing home for quite some time. She has completed her treatment for hepatitis C. Her mood has been fairly stable since we increased the Xanax but she does need medication to help more with her sleep well she is on the prednisone  Allergies: No Known Allergies Medical History: Past Medical History  Diagnosis Date  . Depression   . Anxiety   . Splenomegaly   . Hypertension   . COPD (chronic obstructive pulmonary disease)   . Asthma   . Hepatitis C   Surgical History: Past Surgical History  Procedure Laterality Date  . Abdominal hysterectomy    . Joint replacement    . Colon surgery    Family History family history includes Alcohol abuse in her father and paternal uncle; Anxiety disorder in her daughter; Bipolar disorder in her daughter; Dementia in her father and maternal grandmother; Depression in her brother and paternal uncle; Drug abuse in her brother. There is no history of ADD / ADHD. Reviewed again with no changes noted. Diagnosis:   Axis I: Anxiety Disorder NOS and Major Depression, Recurrent severe Axis II: Deferred Axis III:  Past Medical History  Diagnosis Date  . Depression   . Anxiety   . Splenomegaly   . Hypertension   . COPD (chronic obstructive pulmonary disease)   . Asthma   . Hepatitis C    Axis IV: other psychosocial or environmental  problems Axis V: 51-60 moderate symptoms  ADL's:  Intact  Sleep: Good  Appetite:  Good  Suicidal Ideation:  Pt denies any thoughts, plans, intent of suicide Homicidal Ideation:  Pt denies any thoughts, plans, intent of homicide AEB (as evidenced by):per pt report  Psychiatric Specialty Exam: Review of Systems  Gastrointestinal: Positive for abdominal pain.  Musculoskeletal: Positive for joint pain.  Psychiatric/Behavioral: The patient is nervous/anxious.    ROS: Neuro: no headaches, ataxia, weakness GI: no N/V/D/cramps/constipation MS: no weakness, muscle cramps, aches, but does have joint  pain from arthritis.    Blood pressure 120/80, height 5' 3"  (1.6 m), weight 197 lb (89.359 kg).Body mass index is 34.91 kg/(m^2).  General Appearance: Casual  Eye Contact::  Good  Speech:  Clear and Coherent  Volume:  Normal  Mood:  Anxious somewhat tearful   Affect:  Congruent  Thought Process:  Coherent, Intact and Logical  Orientation:  Full (Time, Place, and Person)  Thought Content:  WDL  Suicidal Thoughts:  No  Homicidal Thoughts:  No  Memory:  Immediate;   Fair Recent;   Fair Remote;   Fair  Judgement:  Good  Insight:  Good  Psychomotor Activity:  Normal  Concentration:  Good  Recall:  Good  Akathisia:  No  Handed:  Ambidextrous  AIMS (if indicated):     Assets:  Communication Skills Desire for Improvement  Sleep:      Current Medications: Current Outpatient Prescriptions  Medication Sig Dispense Refill  . ALPRAZolam (XANAX) 1 MG tablet Take 1 tablet (1 mg total) by mouth 4 (four) times daily.  120 tablet  2  . citalopram (CELEXA) 20 MG tablet Take 1 tablet (20 mg total) by mouth daily.  30 tablet  2  . ranitidine (ZANTAC) 300 MG capsule Take 300 mg by mouth every evening.      . traZODone (DESYREL) 100 MG tablet Take 1 tablet (100 mg total) by mouth at bedtime.  30 tablet  2   No current facility-administered medications for this visit.  Lab Results: No results found for this or any previous visit (from the past 4032 hour(s)). PCP draws routine labs and nothing is emerging as of concern. Physical Findings: AIMS:  , ,  ,  ,    CIWA:    COWS:    Plan: I took her vitals.  I reviewed CC, tobacco/med/surg Hx, meds effects/ side effects, problem list, therapies and responses as well as current situation/symptoms discussed options. Continue current effective medications but increase trazodone to 100 mg each bedtime We will records from [place] to see what studies have been done She'll return in 2 months but call if she needs to come in sooner See orders and  pt instructions for more details. Meds ordered this encounter  Medications  . citalopram (CELEXA) 20 MG tablet    Sig: Take 1 tablet (20 mg total) by mouth daily.    Dispense:  30 tablet    Refill:  2  . traZODone (DESYREL) 100 MG tablet    Sig: Take 1 tablet (100 mg total) by mouth at bedtime.    Dispense:  30 tablet    Refill:  2  . ALPRAZolam (XANAX) 1 MG tablet    Sig: Take 1 tablet (1 mg total) by mouth 4 (four) times daily.    Dispense:  120 tablet    Refill:  2  Medical Decision Making Problem Points:  Established problem, stable/improving (1), Review of last therapy session (1) and  Review of psycho-social stressors (1) Data Points:  Review of medication regiment & side effects (2) I certify that outpatient services furnished can reasonably be expected to improve the patient's condition.  [name] 07/01/2013, 10:58 AM

## 2013-07-27 ENCOUNTER — Other Ambulatory Visit (HOSPITAL_COMMUNITY): Payer: Self-pay | Admitting: Psychiatry

## 2013-08-29 ENCOUNTER — Ambulatory Visit (HOSPITAL_COMMUNITY): Payer: Self-pay | Admitting: Psychiatry

## 2013-09-21 ENCOUNTER — Ambulatory Visit (INDEPENDENT_AMBULATORY_CARE_PROVIDER_SITE_OTHER): Payer: 59 | Admitting: Psychiatry

## 2013-09-21 ENCOUNTER — Encounter (HOSPITAL_COMMUNITY): Payer: Self-pay | Admitting: Psychiatry

## 2013-09-21 VITALS — BP 120/82 | Ht 63.0 in | Wt 206.0 lb

## 2013-09-21 DIAGNOSIS — F411 Generalized anxiety disorder: Secondary | ICD-10-CM

## 2013-09-21 DIAGNOSIS — F329 Major depressive disorder, single episode, unspecified: Secondary | ICD-10-CM

## 2013-09-21 DIAGNOSIS — F3289 Other specified depressive episodes: Secondary | ICD-10-CM

## 2013-09-21 DIAGNOSIS — G4700 Insomnia, unspecified: Secondary | ICD-10-CM

## 2013-09-21 DIAGNOSIS — F332 Major depressive disorder, recurrent severe without psychotic features: Secondary | ICD-10-CM

## 2013-09-21 MED ORDER — ALPRAZOLAM 1 MG PO TABS: 1.0000 mg | ORAL_TABLET | Freq: Four times a day (QID) | ORAL | Status: DC

## 2013-09-21 MED ORDER — TRAZODONE HCL 100 MG PO TABS: 100.0000 mg | ORAL_TABLET | Freq: Every day | ORAL | Status: DC

## 2013-09-21 MED ORDER — CITALOPRAM HYDROBROMIDE 20 MG PO TABS: 20.0000 mg | ORAL_TABLET | Freq: Every day | ORAL | Status: DC

## 2013-09-21 NOTE — Progress Notes (Signed)
Patient ID: [name], female   DOB: 04-Jul-1950, 63 y.o.   MRN: [id number] Patient ID: [name], female   DOB: Oct 13, 1950, 63 y.o.   MRN: [id number] Patient ID: [name], female   DOB: Mar 03, 1951, 63 y.o.   MRN: [id number] Patient ID: [name], female   DOB: May 06, 1950, 63 y.o.   MRN: [id number] [facility] [id number] Progress Note [name] MRN: [id number] DOB: [birth year] Age: 63 y.o.  Date: 09/21/2013 Start Time: 2:30 PM End Time: 2:45 PM  Chief Complaint: Chief Complaint  Patient presents with  . Anxiety  . Depression  . Follow-up    Subjective:  "I've had a lot of stress on  lately."  This patient is a 63 year old widowed white female. She's currently living alone . She resides in [place]. She has 2 grown children-her daughter is in prison for substance possession in her son lives in [place]. She is on disability.  The patient has a long history of depression and anxiety. She generally does pretty well but she's had some bad news lately. Her primary doctors think she has hepatitis C. She went to a GI specialist today who drew a lot of labs. Her platelets are low and her spleen is enlarged but she's not sure what's going to happen from here. She does feel like her psychiatric medications have been helpful. She sleeping pretty well.  Given all these stressors she's holding up well.  The patient returns after 3 months. She's been having severe sinus congestion and now has to have sinus surgery at the end of this week. She's gone through a lot in the past year considering that her husband died and she went through treatment for hepatitis C. Her mood is been holding up pretty well and the Xanax is helping her anxiety. She denies suicidal ideation or any thoughts of self-harm  Allergies: No Known Allergies Medical History: Past Medical History  Diagnosis Date  . Depression   . Anxiety   . Splenomegaly   . Hypertension   . COPD (chronic obstructive pulmonary  disease)   . Asthma   . Hepatitis C   Surgical History: Past Surgical History  Procedure Laterality Date  . Abdominal hysterectomy    . Joint replacement    . Colon surgery    Family History family history includes Alcohol abuse in her father and paternal uncle; Anxiety disorder in her daughter; Bipolar disorder in her daughter; Dementia in her father and maternal grandmother; Depression in her brother and paternal uncle; Drug abuse in her brother. There is no history of ADD / ADHD. Reviewed again with no changes noted. Diagnosis:   Axis I: Anxiety Disorder NOS and Major Depression, Recurrent severe Axis II: Deferred Axis III:  Past Medical History  Diagnosis Date  . Depression   . Anxiety   . Splenomegaly   . Hypertension   . COPD (chronic obstructive pulmonary disease)   . Asthma   . Hepatitis C    Axis IV: other psychosocial or environmental problems Axis V: 51-60 moderate symptoms  ADL's:  Intact  Sleep: Good  Appetite:  Good  Suicidal Ideation:  Pt denies any thoughts, plans, intent of suicide Homicidal Ideation:  Pt denies any thoughts, plans, intent of homicide AEB (as evidenced by):per pt report  Psychiatric Specialty Exam: Review of Systems  Gastrointestinal: Positive for abdominal pain.  Musculoskeletal: Positive for joint pain.  Psychiatric/Behavioral: The patient is nervous/anxious.    ROS: Neuro: no headaches, ataxia, weakness GI:  no N/V/D/cramps/constipation MS: no weakness, muscle cramps, aches, but does have joint pain from arthritis.    Blood pressure 120/82, height 5' 3"  (1.6 m), weight 206 lb (93.441 kg).Body mass index is 36.5 kg/(m^2).  General Appearance: Casual  Eye Contact::  Good  Speech:  Clear and Coherent  Volume:  Normal  Mood:  Anxious   Affect:  Congruent  Thought Process:  Coherent, Intact and Logical  Orientation:  Full (Time, Place, and Person)  Thought Content:  WDL  Suicidal Thoughts:  No  Homicidal Thoughts:  No   Memory:  Immediate;   Fair Recent;   Fair Remote;   Fair  Judgement:  Good  Insight:  Good  Psychomotor Activity:  Normal  Concentration:  Good  Recall:  Good  Akathisia:  No  Handed:  Ambidextrous  AIMS (if indicated):     Assets:  Communication Skills Desire for Improvement  Sleep:      Current Medications: Current Outpatient Prescriptions  Medication Sig Dispense Refill  . ALPRAZolam (XANAX) 1 MG tablet Take 1 tablet (1 mg total) by mouth 4 (four) times daily.  120 tablet  2  . citalopram (CELEXA) 20 MG tablet Take 1 tablet (20 mg total) by mouth daily.  30 tablet  2  . ranitidine (ZANTAC) 300 MG capsule Take 300 mg by mouth every evening.      . traZODone (DESYREL) 100 MG tablet Take 1 tablet (100 mg total) by mouth at bedtime.  30 tablet  2   No current facility-administered medications for this visit.  Lab Results: No results found for this or any previous visit (from the past 4032 hour(s)). PCP draws routine labs and nothing is emerging as of concern. Physical Findings: AIMS:  , ,  ,  ,    CIWA:    COWS:    Plan: I took her vitals.  I reviewed CC, tobacco/med/surg Hx, meds effects/ side effects, problem list, therapies and responses as well as current situation/symptoms discussed options. Continue current effective medications  She'll return in 3 months but call if she needs to come in sooner See orders and pt instructions for more details. Meds ordered this encounter  Medications  . citalopram (CELEXA) 20 MG tablet    Sig: Take 1 tablet (20 mg total) by mouth daily.    Dispense:  30 tablet    Refill:  2  . traZODone (DESYREL) 100 MG tablet    Sig: Take 1 tablet (100 mg total) by mouth at bedtime.    Dispense:  30 tablet    Refill:  2  . DISCONTD: ALPRAZolam (XANAX) 1 MG tablet    Sig: Take 1 tablet (1 mg total) by mouth 4 (four) times daily.    Dispense:  120 tablet    Refill:  2  . ALPRAZolam (XANAX) 1 MG tablet    Sig: Take 1 tablet (1 mg total) by mouth 4  (four) times daily.    Dispense:  120 tablet    Refill:  2  Medical Decision Making Problem Points:  Established problem, stable/improving (1), Review of last therapy session (1) and Review of psycho-social stressors (1) Data Points:  Review of medication regiment & side effects (2) I certify that outpatient services furnished can reasonably be expected to improve the patient's condition.  ROSS, [facility] 09/21/2013, 10:14 AM

## 2013-11-25 ENCOUNTER — Encounter: Payer: Self-pay | Admitting: Emergency Medicine

## 2013-11-25 ENCOUNTER — Encounter (INDEPENDENT_AMBULATORY_CARE_PROVIDER_SITE_OTHER): Payer: Self-pay

## 2013-11-25 ENCOUNTER — Ambulatory Visit (INDEPENDENT_AMBULATORY_CARE_PROVIDER_SITE_OTHER): Payer: Medicare Other | Admitting: Emergency Medicine

## 2013-11-25 VITALS — BP 128/80 | HR 61 | Ht 64.0 in | Wt 203.2 lb

## 2013-11-25 DIAGNOSIS — J45909 Unspecified asthma, uncomplicated: Secondary | ICD-10-CM

## 2013-11-25 DIAGNOSIS — R05 Cough: Secondary | ICD-10-CM

## 2013-11-25 DIAGNOSIS — R053 Chronic cough: Secondary | ICD-10-CM | POA: Insufficient documentation

## 2013-11-25 DIAGNOSIS — R059 Cough, unspecified: Secondary | ICD-10-CM

## 2013-11-25 MED ORDER — LORATADINE 10 MG PO TABS: 10.0000 mg | ORAL_TABLET | Freq: Every day | ORAL | Status: DC

## 2013-11-25 MED ORDER — ESOMEPRAZOLE MAGNESIUM 40 MG PO CPDR: 40.0000 mg | DELAYED_RELEASE_CAPSULE | Freq: Two times a day (BID) | ORAL | Status: DC

## 2013-11-25 MED ORDER — FLUTICASONE PROPIONATE 50 MCG/ACT NA SUSP: 2.0000 | Freq: Two times a day (BID) | NASAL | Status: DC

## 2013-11-25 NOTE — Patient Instructions (Signed)
Please stop your Advair for now Continue to use albuterol as needed for shortness of breath Continue ranitidine 300 mg daily Start Nexium 40 mg twice a day until our next visit Start loratadine 10 mg daily Start fluticasone nose spray, 2 sprays each nostril twice a day We will perform pulmonary function testing at your next office visit Follow with Dr Lamonte Sakai in 1 month with full PFT

## 2013-11-25 NOTE — Assessment & Plan Note (Signed)
Severity unclear but it appears that she has some component of fixed asthma. We will perform full PFTs to evaluate further. She uses albuterol rarely. Will temporarily stop Advair to see if this impacts her cough.

## 2013-11-25 NOTE — Assessment & Plan Note (Signed)
Most significant influence appears to be uncontrolled GERD.  - we'll add back high dose PPI to her ranitidine - start empiric Allergy regimen

## 2013-11-25 NOTE — Progress Notes (Signed)
Subjective:    Patient ID: [name], female    DOB: 1950-06-07, 63 y.o.   MRN: [id number]  HPI 63 year old never smoker with a history of hypertension,hepatitis C, anxiety, childhood asthma for which she was treated with inhaled BD's. She carries a diagnosis of COPD although she has no smoking history, more likely some fixed asthma. The dx was given clinically. She has GERD symptoms previously treated with omeprazole and currently treated with Zantac but still has severe GERD. She is referred for chronic cough. She has been treated with several courses of steroids, abx. She has been on Advair for 2 years. She has been seen Dr Redmond Pulling, ENT 2 months ago in [place]. She uses albuterol nebs prn > about 1-2x a month. She had a CXR 4 weeks ago at [facility].     Review of Systems  Constitutional: Positive for unexpected weight change. Negative for fever.  HENT: Positive for congestion. Negative for dental problem, ear pain, nosebleeds, postnasal drip, rhinorrhea, sinus pressure, sneezing, sore throat and trouble swallowing.   Eyes: Negative for redness and itching.  Respiratory: Positive for cough and shortness of breath. Negative for chest tightness and wheezing.   Cardiovascular: Positive for chest pain and palpitations. Negative for leg swelling.  Gastrointestinal: Positive for abdominal pain. Negative for nausea and vomiting.       Acid heartburn/indigestion  Genitourinary: Negative for dysuria.  Musculoskeletal: Positive for arthralgias. Negative for joint swelling.  Skin: Positive for rash.  Neurological: Positive for headaches.  Hematological: Does not bruise/bleed easily.  Psychiatric/Behavioral: Positive for dysphoric mood. The patient is nervous/anxious.     Past Medical History  Diagnosis Date  . Depression   . Anxiety   . Splenomegaly   . Hypertension   . COPD (chronic obstructive pulmonary disease)   . Asthma   . Hepatitis C      Family History  Problem Relation Age  of Onset  . Alcohol abuse Father   . Dementia Father   . Dementia Maternal Grandmother   . Bipolar disorder Daughter   . Anxiety disorder Daughter   . Depression Brother   . Drug abuse Brother   . Alcohol abuse Paternal Uncle   . Depression Paternal Uncle   . ADD / ADHD Neg Hx      History   Social History  . Marital Status: Widowed    Spouse Name: N/A    Number of Children: N/A  . Years of Education: N/A   Occupational History  . RETIRED    Social History Main Topics  . Smoking status: Never Smoker   . Smokeless tobacco: Never Used  . Alcohol Use: Yes     Comment: 2-3 drinks per week  . Drug Use: No  . Sexual Activity: No   Other Topics Concern  . Not on file   Social History Narrative  . No narrative on file     Allergies  Allergen Reactions  . Levothyroxine     itching     Outpatient Prescriptions Prior to Visit  Medication Sig Dispense Refill  . ALPRAZolam (XANAX) 1 MG tablet Take 1 tablet (1 mg total) by mouth 4 (four) times daily.  120 tablet  2  . citalopram (CELEXA) 20 MG tablet Take 1 tablet (20 mg total) by mouth daily.  30 tablet  2  . ranitidine (ZANTAC) 300 MG capsule Take 300 mg by mouth every evening.      . traZODone (DESYREL) 100 MG tablet Take 1 tablet (  100 mg total) by mouth at bedtime.  30 tablet  2   No facility-administered medications prior to visit.         Objective:   Physical Exam Filed Vitals:   11/25/13 1104  BP: 128/80  Pulse: 61  Height: 5' 4"  (1.626 m)  Weight: 203 lb 3.2 oz (92.171 kg)  SpO2: 98%   Gen: Pleasant, well-nourished, in no distress,  normal affect  ENT: No lesions,  mouth clear,  oropharynx clear, no postnasal drip  Neck: No JVD, no TMG, no carotid bruits  Lungs: No use of accessory muscles, no dullness to percussion, clear without rales or rhonchi  Cardiovascular: RRR, heart sounds normal, no murmur or gallops, no peripheral edema  Musculoskeletal: No deformities, no cyanosis or  clubbing  Neuro: alert, non focal  Skin: Warm, no lesions or rashes       Assessment & Plan:  ASTHMA Severity unclear but it appears that she has some component of fixed asthma. We will perform full PFTs to evaluate further. She uses albuterol rarely. Will temporarily stop Advair to see if this impacts her cough.   Chronic cough Most significant influence appears to be uncontrolled GERD.  - we'll add back high dose PPI to her ranitidine - start empiric Allergy regimen

## 2014-01-06 ENCOUNTER — Ambulatory Visit (INDEPENDENT_AMBULATORY_CARE_PROVIDER_SITE_OTHER): Payer: Medicare Other | Admitting: Emergency Medicine

## 2014-01-06 ENCOUNTER — Encounter: Payer: Self-pay | Admitting: Emergency Medicine

## 2014-01-06 VITALS — BP 142/96 | HR 70 | Temp 97.8°F | Ht 63.5 in | Wt 206.0 lb

## 2014-01-06 DIAGNOSIS — J45901 Unspecified asthma with (acute) exacerbation: Secondary | ICD-10-CM

## 2014-01-06 DIAGNOSIS — J45909 Unspecified asthma, uncomplicated: Secondary | ICD-10-CM

## 2014-01-06 DIAGNOSIS — K219 Gastro-esophageal reflux disease without esophagitis: Secondary | ICD-10-CM

## 2014-01-06 DIAGNOSIS — J4520 Mild intermittent asthma, uncomplicated: Secondary | ICD-10-CM

## 2014-01-06 LAB — PULMONARY FUNCTION TEST
DL/VA % PRED: 104 %
DL/VA: 4.93 ml/min/mmHg/L
DLCO unc % pred: 87 %
DLCO unc: 20.61 ml/min/mmHg
FEF 25-75 PRE: 1.94 L/s
FEF 25-75 Post: 2.21 L/sec
FEF2575-%CHANGE-POST: 13 %
FEF2575-%PRED-POST: 101 %
FEF2575-%PRED-PRE: 89 %
FEV1-%CHANGE-POST: 6 %
FEV1-%PRED-PRE: 86 %
FEV1-%Pred-Post: 92 %
FEV1-PRE: 2.1 L
FEV1-Post: 2.23 L
FEV1FVC-%Change-Post: 8 %
FEV1FVC-%Pred-Pre: 102 %
FEV6-%CHANGE-POST: 2 %
FEV6-%Pred-Post: 86 %
FEV6-%Pred-Pre: 83 %
FEV6-Post: 2.61 L
FEV6-Pre: 2.54 L
FEV6FVC-%Change-Post: 4 %
FEV6FVC-%PRED-PRE: 99 %
FEV6FVC-%Pred-Post: 104 %
FVC-%Change-Post: -1 %
FVC-%Pred-Post: 82 %
FVC-%Pred-Pre: 84 %
FVC-Post: 2.61 L
FVC-Pre: 2.66 L
POST FEV1/FVC RATIO: 86 %
Post FEV6/FVC ratio: 100 %
Pre FEV1/FVC ratio: 79 %
Pre FEV6/FVC Ratio: 96 %
RV % pred: 82 %
RV: 1.68 L
TLC % PRED: 87 %
TLC: 4.37 L

## 2014-01-06 NOTE — Progress Notes (Signed)
Subjective:    Patient ID: [name], female    DOB: [birth year], 63 y.o.   MRN: [id number]  HPI 63 year old never smoker with a history of hypertension,hepatitis C, anxiety, childhood asthma for which she was treated with inhaled BD's. She carries a diagnosis of COPD although she has no smoking history, more likely some fixed asthma. The dx was given clinically. She has GERD symptoms previously treated with omeprazole and currently treated with Zantac but still has severe GERD. She is referred for chronic cough. She has been treated with several courses of steroids, abx. She has been on Advair for 2 years. She has been seen Dr Redmond Pulling, ENT 2 months ago in [place]. She uses albuterol nebs prn > about 1-2x a month. She had a CXR 4 weeks ago at [facility].   ROV 01/06/14 -- follow up visit for chronic cough and suspected asthma. Last time we stop the Advair to see if this would help her cough. We also added a PPI to her ranitidine and started loratadine, fluticasone nasal spray. She returns today after PFT - no obstruction, normal volumes. The regimen we started did decrease her cough some, but she still has a barking cough. She has had ENT sinus sgy before, little improvement (7/'15). She has done [place] before but not currently. She still has some GERD even on higher dose Nexium. She has seen Dr Carlean Purl before.     Review of Systems  Constitutional: Negative for fever and unexpected weight change.  HENT: Positive for congestion. Negative for dental problem, ear pain, nosebleeds, postnasal drip, rhinorrhea, sinus pressure, sneezing, sore throat and trouble swallowing.   Eyes: Negative for redness and itching.  Respiratory: Positive for cough. Negative for chest tightness, shortness of breath and wheezing.   Cardiovascular: Negative for chest pain, palpitations and leg swelling.  Gastrointestinal: Negative for nausea, vomiting and abdominal pain.       Acid heartburn/indigestion  Genitourinary:  Negative for dysuria.  Musculoskeletal: Positive for arthralgias. Negative for joint swelling.  Skin: Positive for rash.  Neurological: Positive for headaches.  Hematological: Does not bruise/bleed easily.  Psychiatric/Behavioral: Negative for dysphoric mood. The patient is nervous/anxious.     Past Medical History  Diagnosis Date  . Depression   . Anxiety   . Splenomegaly   . Hypertension   . COPD (chronic obstructive pulmonary disease)   . Asthma   . Hepatitis C      Family History  Problem Relation Age of Onset  . Alcohol abuse Father   . Dementia Father   . Dementia Maternal Grandmother   . Bipolar disorder Daughter   . Anxiety disorder Daughter   . Depression Brother   . Drug abuse Brother   . Alcohol abuse Paternal Uncle   . Depression Paternal Uncle   . ADD / ADHD Neg Hx      History   Social History  . Marital Status: Widowed    Spouse Name: N/A    Number of Children: N/A  . Years of Education: N/A   Occupational History  . RETIRED    Social History Main Topics  . Smoking status: Never Smoker   . Smokeless tobacco: Never Used  . Alcohol Use: Yes     Comment: 2-3 drinks per week  . Drug Use: No  . Sexual Activity: No   Other Topics Concern  . Not on file   Social History Narrative  . No narrative on file     Allergies  Allergen  Reactions  . Levothyroxine     itching     Outpatient Prescriptions Prior to Visit  Medication Sig Dispense Refill  . ALPRAZolam (XANAX) 1 MG tablet Take 1 tablet (1 mg total) by mouth 4 (four) times daily.  120 tablet  2  . Butalbital-APAP-Caffeine 50-300-40 MG CAPS Take by mouth as needed.      . citalopram (CELEXA) 20 MG tablet Take 1 tablet (20 mg total) by mouth daily.  30 tablet  2  . esomeprazole (NEXIUM) 40 MG capsule Take 1 capsule (40 mg total) by mouth 2 (two) times daily before a meal.  60 capsule  11  . fluticasone (FLONASE) 50 MCG/ACT nasal spray Place 2 sprays into both nostrils 2 (two) times daily.   16 g  12  . loratadine (CLARITIN) 10 MG tablet Take 1 tablet (10 mg total) by mouth daily.  30 tablet  4  . metoprolol succinate (TOPROL-XL) 50 MG 24 hr tablet Take 50 mg by mouth daily. Take with or immediately following a meal.      . omeprazole (PRILOSEC) 40 MG capsule Take 40 mg by mouth daily.      . ranitidine (ZANTAC) 300 MG capsule Take 300 mg by mouth every evening.      . traZODone (DESYREL) 100 MG tablet Take 1 tablet (100 mg total) by mouth at bedtime.  30 tablet  2  . Albuterol Sulfate (VENTOLIN HFA IN) Inhale 2 puffs into the lungs as needed.      . Fluticasone-Salmeterol (ADVAIR) 250-50 MCG/DOSE AEPB Inhale 1 puff into the lungs 2 (two) times daily as needed.      . metoprolol succinate (TOPROL-XL) 25 MG 24 hr tablet Take 25 mg by mouth daily.       No facility-administered medications prior to visit.         Objective:   Physical Exam Filed Vitals:   01/06/14 1430  BP: 142/96  Pulse: 70  Temp: 97.8 F (36.6 C)  TempSrc: Oral  Height: 5' 3.5" (1.613 m)  Weight: 206 lb (93.441 kg)  SpO2: 96%   Gen: Pleasant, well-nourished, in no distress,  normal affect  ENT: No lesions,  mouth clear,  oropharynx clear, some difficulty moving air through her nose  Neck: No JVD, no TMG, no carotid bruits  Lungs: No use of accessory muscles, clear without rales or rhonchi  Cardiovascular: RRR, heart sounds normal, no murmur or gallops, no peripheral edema  Musculoskeletal: No deformities, no cyanosis or clubbing  Neuro: alert, non focal  Skin: Warm, no lesions or rashes       Assessment & Plan:  Chronic cough Impacted by her GERD and her nasal allergies. Her pulmonary function testing surprisingly shows no evidence of residual asthma from childhood. Her cough is better with the medications added last time but neither factor appears to be perfectly controlled.   Your breathing tests do not show any evidence of residual asthma.  We will continue loratadine, fluticasone  nasal spray, Nexium, ranitidine.  Please start doing nasal saline washes daily We will refer you to see Dr Carlean Purl about your reflux disease.  Do not restart Advair Follow with Dr Lamonte Sakai in 3 months or sooner if you have any problems.

## 2014-01-06 NOTE — Patient Instructions (Signed)
Your breathing tests do not show any evidence of residual asthma.  We will continue loratadine, fluticasone nasal spray, Nexium, ranitidine.  Please start doing nasal saline washes daily We will refer you to see Dr Carlean Purl about your reflux disease.  Do not restart Advair Follow with Dr Lamonte Sakai in 3 months or sooner if you have any problems.

## 2014-01-06 NOTE — Progress Notes (Signed)
PFT done today. 

## 2014-01-06 NOTE — Assessment & Plan Note (Signed)
Impacted by her GERD and her nasal allergies. Her pulmonary function testing surprisingly shows no evidence of residual asthma from childhood. Her cough is better with the medications added last time but neither factor appears to be perfectly controlled.   Your breathing tests do not show any evidence of residual asthma.  We will continue loratadine, fluticasone nasal spray, Nexium, ranitidine.  Please start doing nasal saline washes daily We will refer you to see Dr Carlean Purl about your reflux disease.  Do not restart Advair Follow with Dr Lamonte Sakai in 3 months or sooner if you have any problems.

## 2014-01-16 ENCOUNTER — Ambulatory Visit: Payer: Self-pay | Admitting: Physician Assistant

## 2014-01-24 ENCOUNTER — Telehealth (HOSPITAL_COMMUNITY): Payer: Self-pay | Admitting: *Deleted

## 2014-01-24 NOTE — Telephone Encounter (Signed)
You may call in enough to last until appointment only

## 2014-01-24 NOTE — Telephone Encounter (Signed)
Pt calling wanting to know if Dr. Harrington Challenger can refill her Xanax before her scheduled appt? Pt follow up appt is 02-03-14. Pt was last seen 09-21-13 and medication was refilled at that time with 120 tablets 2 refills. Pt number is  [phone]

## 2014-01-25 ENCOUNTER — Other Ambulatory Visit (HOSPITAL_COMMUNITY): Payer: Self-pay | Admitting: Psychiatry

## 2014-01-25 ENCOUNTER — Ambulatory Visit: Payer: Self-pay | Admitting: Physician Assistant

## 2014-01-25 NOTE — Telephone Encounter (Signed)
Pt is aware of Dr. Harrington Challenger decision for her Xanax and shows understanding. Informed pt that I will call her pharmacy with 10 days worth of her Xanax to last her until her appt and pt shows understanding.

## 2014-01-25 NOTE — Telephone Encounter (Signed)
Per Dr. Harrington Challenger to fill pt medication for just enough to last her until her appt. Spoke with Beth the pharmacist and she stated she will get pt medication ready for pt.

## 2014-02-01 ENCOUNTER — Encounter: Payer: Self-pay | Admitting: Gastroenterology

## 2014-02-01 ENCOUNTER — Ambulatory Visit (INDEPENDENT_AMBULATORY_CARE_PROVIDER_SITE_OTHER): Payer: Medicare Other | Admitting: Gastroenterology

## 2014-02-01 ENCOUNTER — Other Ambulatory Visit (INDEPENDENT_AMBULATORY_CARE_PROVIDER_SITE_OTHER): Payer: Medicare Other

## 2014-02-01 VITALS — BP 128/78 | HR 64 | Ht 63.5 in | Wt 217.0 lb

## 2014-02-01 DIAGNOSIS — R109 Unspecified abdominal pain: Secondary | ICD-10-CM

## 2014-02-01 DIAGNOSIS — R059 Cough, unspecified: Secondary | ICD-10-CM

## 2014-02-01 DIAGNOSIS — R0789 Other chest pain: Secondary | ICD-10-CM | POA: Insufficient documentation

## 2014-02-01 DIAGNOSIS — R05 Cough: Secondary | ICD-10-CM | POA: Insufficient documentation

## 2014-02-01 DIAGNOSIS — K219 Gastro-esophageal reflux disease without esophagitis: Secondary | ICD-10-CM

## 2014-02-01 LAB — CBC WITH DIFFERENTIAL/PLATELET
Basophils Absolute: 0 K/uL (ref 0.0–0.1)
Basophils Relative: 0.6 % (ref 0.0–3.0)
Eosinophils Absolute: 0.2 K/uL (ref 0.0–0.7)
Eosinophils Relative: 2.5 % (ref 0.0–5.0)
HCT: 38.2 % (ref 36.0–46.0)
Hemoglobin: 13.1 g/dL (ref 12.0–15.0)
Lymphocytes Relative: 19.8 % (ref 12.0–46.0)
Lymphs Abs: 1.2 K/uL (ref 0.7–4.0)
MCHC: 34.4 g/dL (ref 30.0–36.0)
MCV: 102.1 fl — ABNORMAL HIGH (ref 78.0–100.0)
Monocytes Absolute: 0.6 K/uL (ref 0.1–1.0)
Monocytes Relative: 9.8 % (ref 3.0–12.0)
Neutro Abs: 4 K/uL (ref 1.4–7.7)
Neutrophils Relative %: 67.3 % (ref 43.0–77.0)
Platelets: 110 K/uL — ABNORMAL LOW (ref 150.0–400.0)
RBC: 3.75 Mil/uL — ABNORMAL LOW (ref 3.87–5.11)
RDW: 15.1 % (ref 11.5–15.5)
WBC: 6 K/uL (ref 4.0–10.5)

## 2014-02-01 LAB — COMPREHENSIVE METABOLIC PANEL
ALBUMIN: 4.2 g/dL (ref 3.5–5.2)
ALT: 25 U/L (ref 0–35)
AST: 25 U/L (ref 0–37)
Alkaline Phosphatase: 72 U/L (ref 39–117)
BILIRUBIN TOTAL: 0.7 mg/dL (ref 0.2–1.2)
BUN: 15 mg/dL (ref 6–23)
CO2: 27 meq/L (ref 19–32)
Calcium: 9.3 mg/dL (ref 8.4–10.5)
Chloride: 104 mEq/L (ref 96–112)
Creatinine, Ser: 0.8 mg/dL (ref 0.4–1.2)
GFR: 74.75 mL/min (ref >60.00)
GLUCOSE: 102 mg/dL — AB (ref 70–99)
POTASSIUM: 4.2 meq/L (ref 3.5–5.1)
Sodium: 138 mEq/L (ref 135–145)
TOTAL PROTEIN: 7.3 g/dL (ref 6.0–8.3)

## 2014-02-01 LAB — LIPASE: LIPASE: 22 U/L (ref 11.0–59.0)

## 2014-02-01 MED ORDER — METHYLPREDNISOLONE (PAK) 4 MG PO TABS: ORAL_TABLET | ORAL | Status: DC

## 2014-02-01 MED ORDER — PANTOPRAZOLE SODIUM 40 MG PO TBEC: 40.0000 mg | DELAYED_RELEASE_TABLET | Freq: Two times a day (BID) | ORAL | Status: DC

## 2014-02-01 NOTE — Progress Notes (Signed)
02/01/2014 SHADOW STIGGERS [id number] 26-Sep-1950   HISTORY OF PRESENT ILLNESS:  This is a 63 year old female who apparently is known to Dr. Carlean Purl several years ago for Hep C treatment.  She presents to our office today at the request of pulmonary, Dr. Lamonte Sakai, for evaluation regarding a chronic cough.  She complains of severe coughing for the past year. She complains of burning in her epigastrium and up into her throat that is constant. That issue has been going on longer than the cough. Also has a lot of belching and nausea at times as well.  She has been evaluated and treated from pulmonary standpoint, which is proved unremarkable and unsuccessful in resolving her symptoms. There is note that she had seen ENT previously as well. She was treated for allergy components of her symptoms. She has been placed on maximum acid suppression. She says that she's been taking Zantac 300 milligrams for the past year, but recently about 2 or 3 months ago they added Nexium 40 mg twice daily to her regimen as well.  Notices minimal to no improvement.  She also complains of left upper quadrant abdominal pain that has been present for the past 2 weeks. She says that it hurts a lot when there is pressure on it so feels much better when she doesn't wear a bra.    Past Medical History  Diagnosis Date  . Depression   . Anxiety   . Splenomegaly   . Hypertension   . COPD (chronic obstructive pulmonary disease)   . Asthma   . Hepatitis C   . Migraine headache    Past Surgical History  Procedure Laterality Date  . Abdominal hysterectomy    . Joint replacement    . Colon surgery    . Knee surgery Right     x4    reports that she has never smoked. She has never used smokeless tobacco. She reports that she drinks alcohol. She reports that she does not use illicit drugs. family history includes Alcohol abuse in her father and paternal uncle; Anxiety disorder in her daughter; Bipolar disorder in her daughter;  Dementia in her father and maternal grandmother; Depression in her brother and paternal uncle; Drug abuse in her brother. There is no history of ADD / ADHD. Allergies  Allergen Reactions  . Levothyroxine     itching      Outpatient Encounter Prescriptions as of 02/01/2014  Medication Sig  . ALPRAZolam (XANAX) 1 MG tablet TAKE ONE TABLET BY MOUTH FOUR TIMES DAILY  . Butalbital-APAP-Caffeine 50-300-40 MG CAPS Take by mouth as needed.  . citalopram (CELEXA) 20 MG tablet Take 1 tablet (20 mg total) by mouth daily.  . fluticasone (FLONASE) 50 MCG/ACT nasal spray Place 2 sprays into both nostrils 2 (two) times daily.  Marland Kitchen loratadine (CLARITIN) 10 MG tablet Take 1 tablet (10 mg total) by mouth daily.  . metoprolol succinate (TOPROL-XL) 50 MG 24 hr tablet Take 50 mg by mouth daily. Take with or immediately following a meal.  . ranitidine (ZANTAC) 300 MG capsule Take 300 mg by mouth every evening.  . traZODone (DESYREL) 100 MG tablet Take 1 tablet (100 mg total) by mouth at bedtime.  . [DISCONTINUED] esomeprazole (NEXIUM) 40 MG capsule Take 1 capsule (40 mg total) by mouth 2 (two) times daily before a meal.  . methylPREDNIsolone (MEDROL DOSPACK) 4 MG tablet follow package directions  . pantoprazole (PROTONIX) 40 MG tablet Take 1 tablet (40 mg total) by  mouth 2 (two) times daily.  . [DISCONTINUED] omeprazole (PRILOSEC) 40 MG capsule Take 40 mg by mouth daily.     REVIEW OF SYSTEMS  : All other systems reviewed and negative except where noted in the History of Present Illness.   PHYSICAL EXAM: BP 128/78 mmHg  Pulse 64  Ht 5' 3.5" (1.613 m)  Wt 217 lb (98.431 kg)  BMI 37.83 kg/m2 General: Well developed white female in no acute distress Head: Normocephalic and atraumatic Eyes:  Sclerae anicteric, conjunctiva pink. Ears: Normal auditory acuity Lungs: Clear throughout to auscultation Heart: Regular rate and rhythm Abdomen: Soft, non-distended.  Normal bowel sounds.  Epigastric and LUQ TTP.   Also very painful palpation over left costal margin. Musculoskeletal: Symmetrical with no gross deformities  Skin: No lesions on visible extremities Extremities: No edema  Neurological: Alert oriented x 4, grossly non-focal Psychological:  Alert and cooperative. Normal mood and affect  ASSESSMENT AND PLAN: -Cough, GERD:  Pulmonary questioning if cough is related to reflux.  She is on maximum acid suppression with Nexium 40 mg BID and ranitidine 300 mg at bedtime.  Will discontinue Nexium and change to pantoprazole 40 mg daily and continue ranitidine at bedtime.  Will schedule EGD for evaluation.  ? If she will need a Bravo study at some point as well. -Epigastric/LUQ abdominal pain:  She does have abdominal pain, which is also going to be evaluated with EGD, but will get labs including CBC, CMP, and lipase as well as CT scan abdomen and pelvis with contrast.  Although she has abdominal pain, she is very tender over her left costal margin and I suspect possible costochondritis from chronic coughing.  Will try medrol dose pack to see if this helps.    *Will need eventual colonoscopy as well if never done previously but we did not discuss this today.

## 2014-02-01 NOTE — Progress Notes (Signed)
Agree with Ms. Alphia Kava management.  Rather than Bravo would probably do 24 hour impedance study after EGD  Gatha Mayer, MD, [facility]

## 2014-02-01 NOTE — Patient Instructions (Addendum)
You have been scheduled for an endoscopy. Please follow written instructions given to you at your visit today. If you use inhalers (even only as needed), please bring them with you on the day of your procedure. Your physician has requested that you go to www.startemmi.com and enter the access code given to you at your visit today. This web site gives a general overview about your procedure. However, you should still follow specific instructions given to you by our office regarding your preparation for the procedure.  Please stop Nexium and start Pantoprazole  Pantoprazole 40 mg was sent to your pharmacy, please take one tablet by mouth twice daily   Medrol Dose Pack was sent to your pharmacy  Your physician has requested that you go to the basement for the following lab work before leaving today: CBC CMET LIPASE _____________________________________________________________________________________________________________________________________________________________________________________________________  Dennis Bast have been scheduled for an CT Scan at [facility] (1st floor of hospital) on 02-02-2014 at 4:30 pm. Please arrive 15 minutes prior to your appointment for registration. Make certain not to have anything to eat or drink 6 hours prior to your appointment. Should you need to reschedule your appointment, please contact radiology at [phone]. This test typically takes about 30 minutes to perform.  WARNING: IF YOU ARE ALLERGIC TO IODINE/X-RAY DYE, PLEASE NOTIFY RADIOLOGY IMMEDIATELY AT [phone]! YOU WILL BE GIVEN A 13 HOUR PREMEDICATION PREP.   You have been given 2 bottles of oral contrast to drink. The solution may taste better if refrigerated, but do NOT add ice or any other liquid to this solution. Shake well before drinking.    Drink 1 bottle of contrast @ 2:30 pm (2 hours prior to your exam)  Drink 1 bottle of contrast @ 330 pm(1 hour prior to your exam)  You may take  any medications as prescribed with a small amount of water except for the following: Metformin, Glucophage, Glucovance, Avandamet, Riomet, Fortamet, Actoplus Met, Janumet, Glumetza or Metaglip. The above medications must be held the day of the exam AND 48 hours after the exam. The purpose of you drinking the oral contrast is to aid in the visualization of your intestinal tract. The contrast solution may cause some diarrhea. Before your exam is started, you will be given a small amount of fluid to drink. Depending on your individual set of symptoms, you may also receive an intravenous injection of x-ray contrast/dye. ________________________________________________________________________

## 2014-02-02 ENCOUNTER — Ambulatory Visit (HOSPITAL_COMMUNITY)
Admission: RE | Admit: 2014-02-02 | Discharge: 2014-02-02 | Disposition: A | Discharge status: Home / Self Care | Payer: Medicare Other | Source: Ambulatory Visit | Attending: Gastroenterology | Admitting: Gastroenterology

## 2014-02-02 ENCOUNTER — Other Ambulatory Visit (HOSPITAL_COMMUNITY): Payer: Self-pay | Admitting: Advanced Practice Midwife

## 2014-02-02 DIAGNOSIS — J9811 Atelectasis: Secondary | ICD-10-CM | POA: Insufficient documentation

## 2014-02-02 DIAGNOSIS — N281 Cyst of kidney, acquired: Secondary | ICD-10-CM | POA: Insufficient documentation

## 2014-02-02 DIAGNOSIS — Q644 Malformation of urachus: Secondary | ICD-10-CM | POA: Diagnosis not present

## 2014-02-02 DIAGNOSIS — R140 Abdominal distension (gaseous): Secondary | ICD-10-CM | POA: Diagnosis present

## 2014-02-02 DIAGNOSIS — K439 Ventral hernia without obstruction or gangrene: Secondary | ICD-10-CM | POA: Diagnosis not present

## 2014-02-02 DIAGNOSIS — I700 Atherosclerosis of aorta: Secondary | ICD-10-CM | POA: Diagnosis not present

## 2014-02-02 DIAGNOSIS — R109 Unspecified abdominal pain: Secondary | ICD-10-CM

## 2014-02-02 DIAGNOSIS — R0789 Other chest pain: Secondary | ICD-10-CM

## 2014-02-02 DIAGNOSIS — R161 Splenomegaly, not elsewhere classified: Secondary | ICD-10-CM | POA: Diagnosis not present

## 2014-02-02 DIAGNOSIS — R1084 Generalized abdominal pain: Secondary | ICD-10-CM | POA: Diagnosis present

## 2014-02-02 MED ORDER — IOHEXOL 300 MG/ML  SOLN
100.0000 mL | Freq: Once | INTRAMUSCULAR | Status: AC | PRN
  Administered 2014-02-02: 100 mL via INTRAVENOUS

## 2014-02-03 ENCOUNTER — Ambulatory Visit (INDEPENDENT_AMBULATORY_CARE_PROVIDER_SITE_OTHER): Payer: 59 | Admitting: Psychiatry

## 2014-02-03 ENCOUNTER — Encounter (HOSPITAL_COMMUNITY): Payer: Self-pay | Admitting: Psychiatry

## 2014-02-03 VITALS — BP 149/82 | HR 80 | Ht 63.5 in | Wt 214.8 lb

## 2014-02-03 DIAGNOSIS — F331 Major depressive disorder, recurrent, moderate: Secondary | ICD-10-CM

## 2014-02-03 DIAGNOSIS — G4700 Insomnia, unspecified: Secondary | ICD-10-CM

## 2014-02-03 DIAGNOSIS — F332 Major depressive disorder, recurrent severe without psychotic features: Secondary | ICD-10-CM

## 2014-02-03 DIAGNOSIS — F419 Anxiety disorder, unspecified: Secondary | ICD-10-CM

## 2014-02-03 MED ORDER — ALPRAZOLAM 1 MG PO TABS
1.0000 mg | ORAL_TABLET | Freq: Four times a day (QID) | ORAL | Status: DC
Start: 2014-02-03 — End: 2014-03-27

## 2014-02-03 MED ORDER — TRAZODONE HCL 100 MG PO TABS: 100.0000 mg | ORAL_TABLET | Freq: Every day | ORAL | Status: DC

## 2014-02-03 MED ORDER — CITALOPRAM HYDROBROMIDE 20 MG PO TABS: ORAL_TABLET | ORAL | Status: DC

## 2014-02-03 NOTE — Progress Notes (Signed)
Patient ID: [name], female   DOB: 26-Jan-1951, 63 y.o.   MRN: [id number] Patient ID: [name], female   DOB: September 07, 1950, 63 y.o.   MRN: [id number] Patient ID: [name], female   DOB: 09-17-1950, 63 y.o.   MRN: [id number] Patient ID: [name], female   DOB: [birth year], 63 y.o.   MRN: [id number] Patient ID: [name], female   DOB: 1951/03/13, 63 y.o.   MRN: [id number] [facility] [id number] Progress Note [name] MRN: [id number] DOB: 04/15/1950 Age: 63 y.o.  Date: 02/03/2014 Start Time: 2:30 PM End Time: 2:45 PM  Chief Complaint: Chief Complaint  Patient presents with  . Depression  . Anxiety  . Follow-up    Subjective:  "I've been more depressed lately  This patient is a 63 year old widowed white female. She's currently living alone . She resides in [place]. She has 2 grown children-her daughter is in prison for substance possession in her son lives in [place]. She is on disability.  The patient has a long history of depression and anxiety. She generally does pretty well but she's had some bad news lately. Her primary doctors think she has hepatitis C. She went to a GI specialist today who drew a lot of labs. Her platelets are low and her spleen is enlarged but she's not sure what's going to happen from here. She does feel like her psychiatric medications have been helpful. She sleeping pretty well.  Given all these stressors she's holding up well.  The patient returns after 4 months. She's been more depressed lately. Her aunt died suddenly of pancreatic cancer and they were very close. She's been crying a lot lately. She's also developed thrombocytopenia with mild splenic enlargement. She's now on a prednisone Dosepak to help treat this and the prednisone is making her more irritable and tearful. We both agree that she might benefit from an increase in her Celexa. She denies being suicidal  Allergies: Allergies  Allergen Reactions  . Levothyroxine     itching    Medical History: Past Medical History  Diagnosis Date  . Depression   . Anxiety   . Splenomegaly   . Hypertension   . COPD (chronic obstructive pulmonary disease)   . Asthma   . Hepatitis C   . Migraine headache   Surgical History: Past Surgical History  Procedure Laterality Date  . Abdominal hysterectomy    . Joint replacement    . Colon surgery    . Knee surgery Right     x4  Family History family history includes Alcohol abuse in her father and paternal uncle; Anxiety disorder in her daughter; Bipolar disorder in her daughter; Dementia in her father and maternal grandmother; Depression in her brother and paternal uncle; Drug abuse in her brother. There is no history of ADD / ADHD. Reviewed again with no changes noted. Diagnosis:   Axis I: Anxiety Disorder NOS and Major Depression, Recurrent severe Axis II: Deferred Axis III:  Past Medical History  Diagnosis Date  . Depression   . Anxiety   . Splenomegaly   . Hypertension   . COPD (chronic obstructive pulmonary disease)   . Asthma   . Hepatitis C   . Migraine headache    Axis IV: other psychosocial or environmental problems Axis V: 51-60 moderate symptoms  ADL's:  Intact  Sleep: Good  Appetite:  Good  Suicidal Ideation:  Pt denies any thoughts, plans, intent of suicide Homicidal Ideation:  Pt denies any  thoughts, plans, intent of homicide AEB (as evidenced by):per pt report  Psychiatric Specialty Exam: Review of Systems  Gastrointestinal: Positive for abdominal pain.  Musculoskeletal: Positive for joint pain.  Psychiatric/Behavioral: The patient is nervous/anxious.    ROS: Neuro: no headaches, ataxia, weakness GI: no N/V/D/cramps/constipation MS: no weakness, muscle cramps, aches, but does have joint pain from arthritis.    Blood pressure 149/82, pulse 80, height 5' 3.5" (1.613 m), weight 214 lb 12.8 oz (97.433 kg).Body mass index is 37.45 kg/(m^2).  General Appearance: Casual  Eye Contact::   Good  Speech:  Clear and Coherent  Volume:  Normal  Mood:  Anxious and depressed   Affect:  Sad and tearful   Thought Process:  Coherent, Intact and Logical  Orientation:  Full (Time, Place, and Person)  Thought Content:  WDL  Suicidal Thoughts:  No  Homicidal Thoughts:  No  Memory:  Immediate;   Fair Recent;   Fair Remote;   Fair  Judgement:  Good  Insight:  Good  Psychomotor Activity:  Normal  Concentration:  Good  Recall:  Good  Akathisia:  No  Handed:  Ambidextrous  AIMS (if indicated):     Assets:  Communication Skills Desire for Improvement  Sleep:      Current Medications: Current Outpatient Prescriptions  Medication Sig Dispense Refill  . ALPRAZolam (XANAX) 1 MG tablet Take 1 tablet (1 mg total) by mouth 4 (four) times daily. 120 tablet 2  . Butalbital-APAP-Caffeine 50-300-40 MG CAPS Take by mouth as needed.    . citalopram (CELEXA) 20 MG tablet Take 1 tablet (20 mg total) by mouth daily. 30 tablet 2  . Docusate Calcium (STOOL SOFTENER PO) Take by mouth daily.    . fluticasone (FLONASE) 50 MCG/ACT nasal spray Place 2 sprays into both nostrils 2 (two) times daily. 16 g 12  . loratadine (CLARITIN) 10 MG tablet Take 1 tablet (10 mg total) by mouth daily. 30 tablet 4  . methylPREDNIsolone (MEDROL DOSPACK) 4 MG tablet follow package directions 21 tablet 0  . metoprolol succinate (TOPROL-XL) 50 MG 24 hr tablet Take 50 mg by mouth daily. Take with or immediately following a meal.    . pantoprazole (PROTONIX) 40 MG tablet Take 1 tablet (40 mg total) by mouth 2 (two) times daily. 90 tablet 3  . ranitidine (ZANTAC) 300 MG capsule Take 300 mg by mouth every evening.    . Simethicone (GAS RELIEF PO) Take by mouth as needed.    . traZODone (DESYREL) 100 MG tablet Take 1 tablet (100 mg total) by mouth at bedtime. 30 tablet 2  . citalopram (CELEXA) 20 MG tablet Take one and one half tablets daily 45 tablet 2   No current facility-administered medications for this visit.  Lab  Results:  Results for orders placed or performed in visit on 02/01/14 (from the past 4032 hour(s))  CBC with Differential   Collection Time: 02/01/14 12:02 PM  Result Value Ref Range   WBC 6.0 4.0 - 10.5 K/uL   RBC 3.75 (L) 3.87 - 5.11 Mil/uL   Hemoglobin 13.1 12.0 - 15.0 g/dL   HCT 38.2 36.0 - 46.0 %   MCV 102.1 (H) 78.0 - 100.0 fl   MCHC 34.4 30.0 - 36.0 g/dL   RDW 15.1 11.5 - 15.5 %   Platelets 110.0 (L) 150.0 - 400.0 K/uL   Neutrophils Relative % 67.3 43.0 - 77.0 %   Lymphocytes Relative 19.8 12.0 - 46.0 %   Monocytes Relative 9.8 3.0 - 12.0 %  Eosinophils Relative 2.5 0.0 - 5.0 %   Basophils Relative 0.6 0.0 - 3.0 %   Neutro Abs 4.0 1.4 - 7.7 K/uL   Lymphs Abs 1.2 0.7 - 4.0 K/uL   Monocytes Absolute 0.6 0.1 - 1.0 K/uL   Eosinophils Absolute 0.2 0.0 - 0.7 K/uL   Basophils Absolute 0.0 0.0 - 0.1 K/uL  Comp Met (CMET)   Collection Time: 02/01/14 12:02 PM  Result Value Ref Range   Sodium 138 135 - 145 mEq/L   Potassium 4.2 3.5 - 5.1 mEq/L   Chloride 104 96 - 112 mEq/L   CO2 27 19 - 32 mEq/L   Glucose, Bld 102 (H) 70 - 99 mg/dL   BUN 15 6 - 23 mg/dL   Creatinine, Ser 0.8 0.4 - 1.2 mg/dL   Total Bilirubin 0.7 0.2 - 1.2 mg/dL   Alkaline Phosphatase 72 39 - 117 U/L   AST 25 0 - 37 U/L   ALT 25 0 - 35 U/L   Total Protein 7.3 6.0 - 8.3 g/dL   Albumin 4.2 3.5 - 5.2 g/dL   Calcium 9.3 8.4 - 10.5 mg/dL   GFR 74.75 >60.00 mL/min  Lipase   Collection Time: 02/01/14 12:02 PM  Result Value Ref Range   Lipase 22.0 11.0 - 59.0 U/L  Results for orders placed or performed in visit on 01/06/14 (from the past 4032 hour(s))  Pulmonary Function Test   Collection Time: 01/06/14 12:58 PM  Result Value Ref Range   FVC-Pre 2.66 L   FVC-%Pred-Pre 84 %   FVC-Post 2.61 L   FVC-%Pred-Post 82 %   FVC-%Change-Post -1 %   FEV1-Pre 2.10 L   FEV1-%Pred-Pre 86 %   FEV1-Post 2.23 L   FEV1-%Pred-Post 92 %   FEV1-%Change-Post 6 %   FEV6-Pre 2.54 L   FEV6-%Pred-Pre 83 %   FEV6-Post 2.61 L    FEV6-%Pred-Post 86 %   FEV6-%Change-Post 2 %   Pre FEV1/FVC ratio 79 %   FEV1FVC-%Pred-Pre 102 %   Post FEV1/FVC ratio 86 %   FEV1FVC-%Change-Post 8 %   Pre FEV6/FVC Ratio 96 %   FEV6FVC-%Pred-Pre 99 %   Post FEV6/FVC ratio 100 %   FEV6FVC-%Pred-Post 104 %   FEV6FVC-%Change-Post 4 %   FEF 25-75 Pre 1.94 L/sec   FEF2575-%Pred-Pre 89 %   FEF 25-75 Post 2.21 L/sec   FEF2575-%Pred-Post 101 %   FEF2575-%Change-Post 13 %   RV 1.68 L   RV % pred 82 %   TLC 4.37 L   TLC % pred 87 %   DLCO unc 20.61 ml/min/mmHg   DLCO unc % pred 87 %   DL/VA 4.93 ml/min/mmHg/L   DL/VA % pred 104 %   PCP draws routine labs and nothing is emerging as of concern. Physical Findings: AIMS:  , ,  ,  ,    CIWA:    COWS:    Plan: I took her vitals.  I reviewed CC, tobacco/med/surg Hx, meds effects/ side effects, problem list, therapies and responses as well as current situation/symptoms discussed options. Continue current effective medications or increase Celexa 30 mg daily  She'll return in 2 months but call if she needs to come in sooner See orders and pt instructions for more details. Meds ordered this encounter  Medications  . Simethicone (GAS RELIEF PO)    Sig: Take by mouth as needed.  [name] Calcium (STOOL SOFTENER PO)    Sig: Take by mouth daily.  . traZODone (DESYREL) 100 MG tablet  Sig: Take 1 tablet (100 mg total) by mouth at bedtime.    Dispense:  30 tablet    Refill:  2  . citalopram (CELEXA) 20 MG tablet    Sig: Take one and one half tablets daily    Dispense:  45 tablet    Refill:  2  . ALPRAZolam (XANAX) 1 MG tablet    Sig: Take 1 tablet (1 mg total) by mouth 4 (four) times daily.    Dispense:  120 tablet    Refill:  2  Medical Decision Making Problem Points:  Established problem, stable/improving (1), Review of last therapy session (1) and Review of psycho-social stressors (1) Data Points:  Review of medication regiment & side effects (2) I certify that outpatient services  furnished can reasonably be expected to improve the patient's condition.  ROSS, [facility] 02/03/2014, 3:59 PM

## 2014-02-17 ENCOUNTER — Encounter: Payer: Self-pay | Admitting: Internal Medicine

## 2014-03-27 ENCOUNTER — Encounter (HOSPITAL_COMMUNITY): Payer: Self-pay | Admitting: Psychiatry

## 2014-03-27 ENCOUNTER — Ambulatory Visit (INDEPENDENT_AMBULATORY_CARE_PROVIDER_SITE_OTHER): Payer: 59 | Admitting: Psychiatry

## 2014-03-27 VITALS — BP 155/85 | HR 77 | Ht 63.5 in | Wt 219.4 lb

## 2014-03-27 DIAGNOSIS — F332 Major depressive disorder, recurrent severe without psychotic features: Secondary | ICD-10-CM

## 2014-03-27 DIAGNOSIS — F419 Anxiety disorder, unspecified: Secondary | ICD-10-CM

## 2014-03-27 DIAGNOSIS — G4700 Insomnia, unspecified: Secondary | ICD-10-CM

## 2014-03-27 DIAGNOSIS — F331 Major depressive disorder, recurrent, moderate: Secondary | ICD-10-CM

## 2014-03-27 MED ORDER — TRAZODONE HCL 100 MG PO TABS: 100.0000 mg | ORAL_TABLET | Freq: Every day | ORAL | Status: DC

## 2014-03-27 MED ORDER — CITALOPRAM HYDROBROMIDE 20 MG PO TABS: 20.0000 mg | ORAL_TABLET | Freq: Every day | ORAL | Status: DC

## 2014-03-27 MED ORDER — ALPRAZOLAM 1 MG PO TABS: 1.0000 mg | ORAL_TABLET | Freq: Four times a day (QID) | ORAL | Status: DC

## 2014-03-27 NOTE — Progress Notes (Signed)
Patient ID: [name], female   DOB: May 27, 1950, 64 y.o.   MRN: [id number] Patient ID: [name], female   DOB: 09-08-1950, 64 y.o.   MRN: [id number] Patient ID: [name], female   DOB: Aug 01, 1950, 64 y.o.   MRN: [id number] Patient ID: YSABELLA [name], female   DOB: [birth year], 64 y.o.   MRN: [id number] Patient ID: [name], female   DOB: 1951/03/03, 63 y.o.   MRN: [id number] Patient ID: [name], female   DOB: [birth year], 64 y.o.   MRN: [id number] [name] [address]9213 Progress Note FAITHANNE VERRET MRN: [id number] DOB: March 11, 1951 Age: 64 y.o.  Date: 03/27/2014 Start Time: 2:30 PM End Time: 2:45 PM  Chief Complaint: Chief Complaint  Patient presents with  . Depression  . Anxiety  . Follow-up    Subjective:  "I've been more depressed lately  This patient is a 64 year old widowed white female. She's currently living alone . She resides in [place]. She has 2 grown children-her daughter is in prison for substance possession in her son lives in [place]. She is on disability.  The patient has a long history of depression and anxiety. She generally does pretty well but she's had some bad news lately. Her primary doctors think she has hepatitis C. She went to a GI specialist today who drew a lot of labs. Her platelets are low and her spleen is enlarged but she's not sure what's going to happen from here. She does feel like her psychiatric medications have been helpful. She sleeping pretty well.  Given all these stressors she's holding up well.  The patient returns after 3 months. She states she feels tired and bloated. She's going to have endoscopy tomorrow and shortly thereafter she is going to have her gallbladder removed. She continues to have splenomegaly and low platelet count. Overall she feels like her medications have been helpful and she's hoping that after her gallbladder is out she'll have more energy and feel better Allergies: Allergies  Allergen Reactions  .  Levothyroxine     itching   Medical History: Past Medical History  Diagnosis Date  . Depression   . Anxiety   . Splenomegaly   . Hypertension   . COPD (chronic obstructive pulmonary disease)   . Asthma   . Hepatitis C   . Migraine headache   Surgical History: Past Surgical History  Procedure Laterality Date  . Abdominal hysterectomy    . Joint replacement    . Colon surgery    . Knee surgery Right     x4  Family History family history includes Alcohol abuse in her father and paternal uncle; Anxiety disorder in her daughter; Bipolar disorder in her daughter; Dementia in her father and maternal grandmother; Depression in her brother and paternal uncle; Drug abuse in her brother. There is no history of ADD / ADHD. Reviewed again with no changes noted. Diagnosis:   Axis I: Anxiety Disorder NOS and Major Depression, Recurrent severe Axis II: Deferred Axis III:  Past Medical History  Diagnosis Date  . Depression   . Anxiety   . Splenomegaly   . Hypertension   . COPD (chronic obstructive pulmonary disease)   . Asthma   . Hepatitis C   . Migraine headache    Axis IV: other psychosocial or environmental problems Axis V: 51-60 moderate symptoms  ADL's:  Intact  Sleep: Good  Appetite:  Good  Suicidal Ideation:  Pt denies any thoughts, plans, intent of suicide Homicidal  Ideation:  Pt denies any thoughts, plans, intent of homicide AEB (as evidenced by):per pt report  Psychiatric Specialty Exam: Review of Systems  Gastrointestinal: Positive for abdominal pain.  Musculoskeletal: Positive for joint pain.  Psychiatric/Behavioral: The patient is nervous/anxious.    ROS: Neuro: no headaches, ataxia, weakness GI: no N/V/D/cramps/constipation MS: no weakness, muscle cramps, aches, but does have joint pain from arthritis.    Blood pressure 155/85, pulse 77, height 5' 3.5" (1.613 m), weight 219 lb 6.4 oz (99.519 kg).Body mass index is 38.25 kg/(m^2).  General Appearance:  Casual  Eye Contact::  Good  Speech:  Clear and Coherent  Volume:  Normal  Mood:  Anxious    Affect: Coherent   Thought Process:  Coherent, Intact and Logical  Orientation:  Full (Time, Place, and Person)  Thought Content:  WDL  Suicidal Thoughts:  No  Homicidal Thoughts:  No  Memory:  Immediate;   Fair Recent;   Fair Remote;   Fair  Judgement:  Good  Insight:  Good  Psychomotor Activity:  Normal  Concentration:  Good  Recall:  Good  Akathisia:  No  Handed:  Ambidextrous  AIMS (if indicated):     Assets:  Communication Skills Desire for Improvement  Sleep:      Current Medications: Current Outpatient Prescriptions  Medication Sig Dispense Refill  . ALPRAZolam (XANAX) 1 MG tablet Take 1 tablet (1 mg total) by mouth 4 (four) times daily. 120 tablet 2  . Butalbital-APAP-Caffeine 50-300-40 MG CAPS Take by mouth as needed.    . citalopram (CELEXA) 20 MG tablet Take 1 tablet (20 mg total) by mouth daily. 30 tablet 2  . Docusate Calcium (STOOL SOFTENER PO) Take by mouth daily.    . fluticasone (FLONASE) 50 MCG/ACT nasal spray Place 2 sprays into both nostrils 2 (two) times daily. 16 g 12  . loratadine (CLARITIN) 10 MG tablet Take 1 tablet (10 mg total) by mouth daily. 30 tablet 4  . methylPREDNIsolone (MEDROL DOSPACK) 4 MG tablet follow package directions 21 tablet 0  . metoprolol succinate (TOPROL-XL) 50 MG 24 hr tablet Take 50 mg by mouth daily. Take with or immediately following a meal.    . pantoprazole (PROTONIX) 40 MG tablet Take 1 tablet (40 mg total) by mouth 2 (two) times daily. 90 tablet 3  . ranitidine (ZANTAC) 300 MG capsule Take 300 mg by mouth every evening.    . Simethicone (GAS RELIEF PO) Take by mouth as needed.    . traZODone (DESYREL) 100 MG tablet Take 1 tablet (100 mg total) by mouth at bedtime. 30 tablet 2   No current facility-administered medications for this visit.  Lab Results:  Results for orders placed or performed in visit on 02/01/14 (from the past  4032 hour(s))  CBC with Differential   Collection Time: 02/01/14 12:02 PM  Result Value Ref Range   WBC 6.0 4.0 - 10.5 K/uL   RBC 3.75 (L) 3.87 - 5.11 Mil/uL   Hemoglobin 13.1 12.0 - 15.0 g/dL   HCT 38.2 36.0 - 46.0 %   MCV 102.1 (H) 78.0 - 100.0 fl   MCHC 34.4 30.0 - 36.0 g/dL   RDW 15.1 11.5 - 15.5 %   Platelets 110.0 (L) 150.0 - 400.0 K/uL   Neutrophils Relative % 67.3 43.0 - 77.0 %   Lymphocytes Relative 19.8 12.0 - 46.0 %   Monocytes Relative 9.8 3.0 - 12.0 %   Eosinophils Relative 2.5 0.0 - 5.0 %   Basophils Relative 0.6 0.0 -  3.0 %   Neutro Abs 4.0 1.4 - 7.7 K/uL   Lymphs Abs 1.2 0.7 - 4.0 K/uL   Monocytes Absolute 0.6 0.1 - 1.0 K/uL   Eosinophils Absolute 0.2 0.0 - 0.7 K/uL   Basophils Absolute 0.0 0.0 - 0.1 K/uL  Comp Met (CMET)   Collection Time: 02/01/14 12:02 PM  Result Value Ref Range   Sodium 138 135 - 145 mEq/L   Potassium 4.2 3.5 - 5.1 mEq/L   Chloride 104 96 - 112 mEq/L   CO2 27 19 - 32 mEq/L   Glucose, Bld 102 (H) 70 - 99 mg/dL   BUN 15 6 - 23 mg/dL   Creatinine, Ser 0.8 0.4 - 1.2 mg/dL   Total Bilirubin 0.7 0.2 - 1.2 mg/dL   Alkaline Phosphatase 72 39 - 117 U/L   AST 25 0 - 37 U/L   ALT 25 0 - 35 U/L   Total Protein 7.3 6.0 - 8.3 g/dL   Albumin 4.2 3.5 - 5.2 g/dL   Calcium 9.3 8.4 - 10.5 mg/dL   GFR 74.75 >60.00 mL/min  Lipase   Collection Time: 02/01/14 12:02 PM  Result Value Ref Range   Lipase 22.0 11.0 - 59.0 U/L  Results for orders placed or performed in visit on 01/06/14 (from the past 4032 hour(s))  Pulmonary Function Test   Collection Time: 01/06/14 12:58 PM  Result Value Ref Range   FVC-Pre 2.66 L   FVC-%Pred-Pre 84 %   FVC-Post 2.61 L   FVC-%Pred-Post 82 %   FVC-%Change-Post -1 %   FEV1-Pre 2.10 L   FEV1-%Pred-Pre 86 %   FEV1-Post 2.23 L   FEV1-%Pred-Post 92 %   FEV1-%Change-Post 6 %   FEV6-Pre 2.54 L   FEV6-%Pred-Pre 83 %   FEV6-Post 2.61 L   FEV6-%Pred-Post 86 %   FEV6-%Change-Post 2 %   Pre FEV1/FVC ratio 79 %    FEV1FVC-%Pred-Pre 102 %   Post FEV1/FVC ratio 86 %   FEV1FVC-%Change-Post 8 %   Pre FEV6/FVC Ratio 96 %   FEV6FVC-%Pred-Pre 99 %   Post FEV6/FVC ratio 100 %   FEV6FVC-%Pred-Post 104 %   FEV6FVC-%Change-Post 4 %   FEF 25-75 Pre 1.94 L/sec   FEF2575-%Pred-Pre 89 %   FEF 25-75 Post 2.21 L/sec   FEF2575-%Pred-Post 101 %   FEF2575-%Change-Post 13 %   RV 1.68 L   RV % pred 82 %   TLC 4.37 L   TLC % pred 87 %   DLCO unc 20.61 ml/min/mmHg   DLCO unc % pred 87 %   DL/VA 4.93 ml/min/mmHg/L   DL/VA % pred 104 %   . Physical Findings: AIMS:  , ,  ,  ,    CIWA:    COWS:    Plan: I took her vitals.  I reviewed CC, tobacco/med/surg Hx, meds effects/ side effects, problem list, therapies and responses as well as current situation/symptoms discussed options. Continue current effective medications   She'll return in 3 months but call if she needs to come in sooner See orders and pt instructions for more details. Meds ordered this encounter  Medications  . citalopram (CELEXA) 20 MG tablet    Sig: Take 1 tablet (20 mg total) by mouth daily.    Dispense:  30 tablet    Refill:  2  . traZODone (DESYREL) 100 MG tablet    Sig: Take 1 tablet (100 mg total) by mouth at bedtime.    Dispense:  30 tablet    Refill:    2  . ALPRAZolam (XANAX) 1 MG tablet    Sig: Take 1 tablet (1 mg total) by mouth 4 (four) times daily.    Dispense:  120 tablet    Refill:  2  Medical Decision Making Problem Points:  Established problem, stable/improving (1), Review of last therapy session (1) and Review of psycho-social stressors (1) Data Points:  Review of medication regiment & side effects (2) I certify that outpatient services furnished can reasonably be expected to improve the patient's condition.  ROSS, DEBORAH 03/27/2014, 2:25 PM   

## 2014-03-28 ENCOUNTER — Encounter: Payer: Self-pay | Admitting: Internal Medicine

## 2014-03-28 ENCOUNTER — Telehealth: Payer: Self-pay

## 2014-03-28 ENCOUNTER — Ambulatory Visit (AMBULATORY_SURGERY_CENTER): Payer: Medicare Other | Admitting: Internal Medicine

## 2014-03-28 VITALS — BP 132/75 | HR 59 | Temp 97.6°F | Resp 18 | Ht 63.5 in | Wt 217.0 lb

## 2014-03-28 DIAGNOSIS — K219 Gastro-esophageal reflux disease without esophagitis: Secondary | ICD-10-CM

## 2014-03-28 DIAGNOSIS — K449 Diaphragmatic hernia without obstruction or gangrene: Secondary | ICD-10-CM

## 2014-03-28 DIAGNOSIS — R059 Cough, unspecified: Secondary | ICD-10-CM

## 2014-03-28 DIAGNOSIS — R05 Cough: Secondary | ICD-10-CM

## 2014-03-28 MED ORDER — SODIUM CHLORIDE 0.9 % IV SOLN: 500.0000 mL | INTRAVENOUS | Status: DC

## 2014-03-28 NOTE — Progress Notes (Signed)
Report to PACU, RN, vss, BBS= Clear.  

## 2014-03-28 NOTE — Patient Instructions (Addendum)
There was a small hiatal hernia (stomach moves into chest from abdomen) that is not likely a problem. Everything else is ok.  In order to understand if cough is coming fom reflux we need to have you do a test called manometry and 24 hour pH and impedance testing. This involves wearing a skinny tube in your nose and esophagus for a day. It sounds bad but most people can do it. My nurse will contact you about scheduling this.  If you have never had a colonoscopy that should be scheduled at some point.   I appreciate the opportunity to care for you. Gatha Mayer, MD, [facility]  Discharge instructions given. Handout on a hiatal hernia. Office will schedule her for esophageal manometry and 24hour ph/impedance study. Resume previous medications. YOU HAD AN ENDOSCOPIC PROCEDURE TODAY AT [facility]: Refer to the procedure report that was given to you for any specific questions about what was found during the examination.  If the procedure report does not answer your questions, please call your gastroenterologist to clarify.  If you requested that your care partner not be given the details of your procedure findings, then the procedure report has been included in a sealed envelope for you to review at your convenience later.  YOU SHOULD EXPECT: Some feelings of bloating in the abdomen. Passage of more gas than usual.  Walking can help get rid of the air that was put into your GI tract during the procedure and reduce the bloating. If you had a lower endoscopy (such as a colonoscopy or flexible sigmoidoscopy) you may notice spotting of blood in your stool or on the toilet paper. If you underwent a bowel prep for your procedure, then you may not have a normal bowel movement for a few days.  DIET: Your first meal following the procedure should be a light meal and then it is ok to progress to your normal diet.  A half-sandwich or bowl of soup is an example of a good first meal.  Heavy  or fried foods are harder to digest and may make you feel nauseous or bloated.  Likewise meals heavy in dairy and vegetables can cause extra gas to form and this can also increase the bloating.  Drink plenty of fluids but you should avoid alcoholic beverages for 24 hours.  ACTIVITY: Your care partner should take you home directly after the procedure.  You should plan to take it easy, moving slowly for the rest of the day.  You can resume normal activity the day after the procedure however you should NOT DRIVE or use heavy machinery for 24 hours (because of the sedation medicines used during the test).    SYMPTOMS TO REPORT IMMEDIATELY: A gastroenterologist can be reached at any hour.  During normal business hours, 8:30 AM to 5:00 PM Monday through Friday, call [phone].  After hours and on weekends, please call the GI answering service at [phone] who will take a message and have the physician on call contact you.   Following upper endoscopy (EGD)  Vomiting of blood or coffee ground material  New chest pain or pain under the shoulder blades  Painful or persistently difficult swallowing  New shortness of breath  Fever of 100F or higher  Black, tarry-looking stools  FOLLOW UP: If any biopsies were taken you will be contacted by phone or by letter within the next 1-3 weeks.  Call your gastroenterologist if you have not heard about the biopsies in  3 weeks.  Our staff will call the home number listed on your records the next business day following your procedure to check on you and address any questions or concerns that you may have at that time regarding the information given to you following your procedure. This is a courtesy call and so if there is no answer at the home number and we have not heard from you through the emergency physician on call, we will assume that you have returned to your regular daily activities without incident.  SIGNATURES/CONFIDENTIALITY: You and/or your care  partner have signed paperwork which will be entered into your electronic medical record.  These signatures attest to the fact that that the information above on your After Visit Summary has been reviewed and is understood.  Full responsibility of the confidentiality of this discharge information lies with you and/or your care-partner.

## 2014-03-28 NOTE — Op Note (Signed)
[facility]  [address] [place], [id number]   ENDOSCOPY PROCEDURE REPORT  PATIENT: [name], [name]  MR#: [id number] BIRTHDATE: June 30, 1950 , 82  yrs. old GENDER: female ENDOSCOPIST: Gatha Mayer, MD, [facility] PROCEDURE DATE:  03/28/2014 PROCEDURE:  EGD, diagnostic ASA CLASS: INDICATIONS:  cough with GERD. MEDICATIONS: Propofol 150 mg IV, Monitored anesthesia care, and lidocaine 150 mg IV TOPICAL ANESTHETIC: none  DESCRIPTION OF PROCEDURE: After the risks benefits and alternatives of the procedure were thoroughly explained, informed consent was obtained.  The LB [id number] endoscope was introduced through the mouth and advanced to the second portion of the duodenum , Without limitations.  The instrument was slowly withdrawn as the mucosa was fully examined.    1) Small sliding hiatal hernia 2) Otherwise normal EGD.  Retroflexed views revealed a hiatal hernia.     The scope was then withdrawn from the patient and the procedure completed.  COMPLICATIONS: There were no immediate complications.  ENDOSCOPIC IMPRESSION: 1) Small sliding hiatal hernia 2) Otherwise normal EGD  RECOMMENDATIONS: My RN will schedule her for esophageal manometry and 24 hour pH/impedance study to see if there is acidic or non-acidic reflux despite taking PPI and linked to cough.  (study will be done on PPI and H2 blocker)  If she has never had a colonoscopy should have one - will discuss    eSigned:  Gatha Mayer, MD, [facility] 03/28/2014 10:27 AM    CC: Londell Moh, MD , Dr. Francesco Runner, The Patient

## 2014-03-28 NOTE — Telephone Encounter (Signed)
Per procedure report on 03/28/14 patient needs esophgeal mano and 24 hour ph with impedence on PPI.  Patient is scheduled for 04/10/14 8:30 at [facility].  Patient to be NPO after midnight.   Patient notified of appt date and time and instructions.

## 2014-03-29 ENCOUNTER — Telehealth: Payer: Self-pay | Admitting: *Deleted

## 2014-03-29 NOTE — Telephone Encounter (Signed)
  Follow up Call-  Call back number 03/28/2014  Post procedure Call Back phone  # [phone]  Permission to leave phone message Yes     Patient questions:  Do you have a fever, pain , or abdominal swelling? No. Pain Score  0 *  Have you tolerated food without any problems? Yes.    Have you been able to return to your normal activities? Yes.    Do you have any questions about your discharge instructions: Diet   No. Medications  No. Follow up visit  No.  Do you have questions or concerns about your Care? No.  Actions: * If pain score is 4 or above: No action needed, pain <4.  No problems noted. maw

## 2014-04-03 ENCOUNTER — Ambulatory Visit: Payer: 59 | Admitting: Emergency Medicine

## 2014-04-04 ENCOUNTER — Ambulatory Visit: Payer: 59 | Admitting: Emergency Medicine

## 2014-04-10 ENCOUNTER — Encounter (HOSPITAL_COMMUNITY)
Admission: RE | Disposition: A | Discharge status: Home / Self Care | Payer: Self-pay | Source: Ambulatory Visit | Attending: Internal Medicine

## 2014-04-10 ENCOUNTER — Ambulatory Visit (HOSPITAL_COMMUNITY)
Admission: RE | Admit: 2014-04-10 | Discharge: 2014-04-10 | Disposition: A | Discharge status: Home / Self Care | Payer: Medicare Other | Source: Ambulatory Visit | Attending: Internal Medicine | Admitting: Internal Medicine

## 2014-04-10 DIAGNOSIS — K219 Gastro-esophageal reflux disease without esophagitis: Secondary | ICD-10-CM | POA: Insufficient documentation

## 2014-04-10 DIAGNOSIS — R05 Cough: Secondary | ICD-10-CM | POA: Insufficient documentation

## 2014-04-10 HISTORY — PX: PH IMPEDANCE STUDY: SHX5565

## 2014-04-10 HISTORY — PX: ESOPHAGEAL MANOMETRY: SHX5429

## 2014-04-10 SURGERY — MANOMETRY, ESOPHAGUS
Anesthesia: Topical

## 2014-04-10 MED ORDER — LIDOCAINE VISCOUS 2 % MT SOLN
OROMUCOSAL | Status: AC
  Filled 2014-04-10: qty 15

## 2014-04-10 SURGICAL SUPPLY — 2 items
FACESHIELD LNG OPTICON STERILE (SAFETY) IMPLANT
GLOVE BIO SURGEON STRL SZ8 (GLOVE) ×6 IMPLANT

## 2014-04-11 ENCOUNTER — Encounter (HOSPITAL_COMMUNITY): Payer: Self-pay | Admitting: Internal Medicine

## 2014-04-18 ENCOUNTER — Telehealth: Payer: Self-pay | Admitting: Internal Medicine

## 2014-04-18 NOTE — Telephone Encounter (Signed)
I have asked for the tracings to be sent to me - so hopefully 1-2 d

## 2014-04-18 NOTE — Telephone Encounter (Signed)
Patient notified that the results are not available yet.    Dr. Carlean Purl have you seen the mano/24 hour impedence results yet?

## 2014-04-20 ENCOUNTER — Other Ambulatory Visit: Payer: Self-pay | Admitting: Internal Medicine

## 2014-04-20 DIAGNOSIS — K219 Gastro-esophageal reflux disease without esophagitis: Secondary | ICD-10-CM

## 2014-04-20 MED ORDER — METOCLOPRAMIDE HCL 10 MG PO TABS: 10.0000 mg | ORAL_TABLET | Freq: Three times a day (TID) | ORAL | Status: DC

## 2014-04-20 NOTE — Op Note (Signed)
ESOPHAGEAL MANOMETRY  Patient: [name], [name]   [id number] Gender: Female Physician: Dr. Silvano Rusk   DOB / Age: 22-Mar-1950 Operator: Carmie End, RN, BSN   Height: 5 ft 3 in Referring Physician:    Procedure: EMZ Examination Date: 04/10/2014    Swallow Composite (mean of 10 swallows) Resting Pressure Profile & Anatomy     Basal Pressures* LES, respiratory mean(mmHg) 28.0 (13-43) UES mean(mmHg) 79.8 (34-104)  Anatomy* LES proximal(cm) 40.3 LES intraabdominal(cm) 0.0 Esophageal length(cm) 22.4 Hiatal hernia Yes, 0.7    Motility* Distal contr. integral(mmHg-cm-s) 2026.5 [phone]) Distal contr. int. (highest)(mmHg-cm-s) 3510.7 Incomplete bolus clearance(%) 10  Residual Pressures* LES (mean)(mmHg) 10.3 (<15.0) UES (mean)(mmHg) 10.1 (<12.0)  *Notes. Motility values are mean among swallows; Normal values in (xxx.x):  Simultaneous contractions: Velocity > 8.0 cm/s; eSlv: eSleeve; 3SN, IRP, DCI, IBP - See manual definitions  Lower Esophageal Sphincter Region  Normal Esophageal Motility  Normal  Landmarks   Number of swallows evaluated 10       Proximal LES (from nares)(cm) 40.3  High Resolution Parameters        LES length(cm) 3.2 2.7-4.8     Distal contractile integral(mean)(mmHg-cm-s) 2026.5 [phone]      Esophageal length (LES-UES centers)(cm) 22.4      Distal contractile integral(highest)(mmHg-cm-s) 3510.7       Intraabdominal LES length(cm) 0.0      Contractile front velocity(cm/s) 2.3 <9.0      Hiatal hernia? Yes, 0.7  Chicago Classification    LES Pressures       Distal latency 7.8       Pressure meas. method eSleeve,IRP      % failed (Chicago Classification) 10       Basal (respiratory min.)(mmHg) 26.1 4.8-32.0     % panesophageal pressurization 10       Basal (respiratory mean)(mmHg) 28.0 13-43     % premature contraction 0       Residual (mean)(mmHg) 10.3 <15.0     % rapid contraction 0          % large breaks 0          % small breaks 0      Impedance analysis             Incomplete bolus clearance(%) 10          Upper Esophageal Sphincter  Normal Pharyngeal / UES Motility  Normal  Mean basal pressure(mmHg) 79.8 34-104 No. swallows evaluated 10   Mean residual pressure(mmHg) 10.1 <12.0 Evaluated @ 3.0 & N/A above UES           Mean peak pressure(mmHg) 10.9       Chicago Classification Findings*  No Chicago Classification abnormality found * Findings are based on published Chicago Classification scheme and are only intended to serve as a guide for patient diagnosis   Procedure  After confirmation of potential allergies, a topical analgesic was used to numb the nares followed by trans-nasal insertion of a High Resolution Manometry Catheter. Pressure bands of both UES and LES were observed on the color contour. Patient instructed to take deep breath to verify placement of catheter; diaphragmatic pinch noted on inspiration. Patient was assisted to supine position and catheter was stabilized. Patient encouraged to relax while acclimating to catheter for approximately 5 minutes. A 30 second baseline pressure was obtained to identify the UES and LES followed by a series of wet swallows using 5 ccs room temperature water to assess esophageal motility. At the conclusion of the procedure; the  catheter was removed.   Indications  Heart burn   Interpretation / Findings  Normal Esophageal Manometry Study   Dr. Silvano Rusk E sign 04/20/2014 1655hrs

## 2014-04-20 NOTE — Assessment & Plan Note (Signed)
Will add Reglan as she did have post-prandial reflux on 24 hr impedance study - both acidic and weakly/non-acidic Side effects discussed She is advised to call for f/u appt to be seen in march

## 2014-04-20 NOTE — Op Note (Signed)
24 HOUR pH-IMPEDANCE STUDY      pH Analysis Thresholds Channel 1  Threshold, pH 4.0    Study Durations(HH:MM) Time  Total 24:00  Upright 15:38  Supine 08:22  Post-prandial 05:50    Acid Reflux Analysis Channel 1  Total        Time spent in reflux (HH:MM) 00:05       Number of refluxes 20       Number of refluxes per hour 0.8       % Time spent in reflux 0.4       Number of long refluxes 0       Longest reflux (HH:MM) 00:01  Upright        Time spent in reflux (HH:MM) 00:05       Number of refluxes 20       Number of refluxes per hour 1.3       % Time spent in reflux 0.6       Number of long refluxes 0       Longest reflux (HH:MM) 00:01  Supine        Time spent in reflux (HH:MM) 00:00       Number of refluxes 0       Number of refluxes per hour 0.0       % Time spent in reflux 0.0       Number of long refluxes 0       Longest reflux (HH:MM) N/A  Post-prandial        Time spent in reflux (HH:MM) 00:02       Number of refluxes 7       Number of refluxes per hour 1.2       % Time spent in reflux 0.5       Number of long refluxes 0       Longest reflux (HH:MM) 00:01    DeMeester Score Channel 1  Normal ? 14.72 (95th percentile) 2.5    Symptom Analysis (pH) Heartburn       No. of symptoms analyzed 65  Channel 1        No. of symptoms related to reflux 8       No. of symptoms not related to Reflux 57       No. of reflux periods 20       Symptom index for reflux (SI) 12.3       Symptom sensitivity index (SSI) 40.0       Symptom association prob. (SAP)* 100.0  * Probability that symptom and reflux are not associated solely by chance, (>95% is significant)   Normalized Reflux Episode Activity* Channel 1 Normal**  Total         Acid reflux 2 55       Weakly acidic reflux 13 26       Non-acid reflux 0 1       All reflux 15 73  * Episodes/24 hr. ** 95th percentile   Reflux Episode Activity Channel 1  Total        Acid reflux 2       Weakly acidic reflux 13       Non-acid reflux 0       All reflux 15  Upright        Acid reflux 2       Weakly acidic reflux 12       Non-acid reflux 0       All reflux 14  Supine  Acid reflux 0       Weakly acidic reflux 1       Non-acid reflux 0       All reflux 1  Post-prandial        Acid reflux 0       Weakly acidic reflux 7       Non-acid reflux 0       All reflux 7    Bolus Exposure Time*   5.0 cm above LES        Total 0.2       Upright 0.3       Supine 0.0       Postprandial 0.4  *% time refluxate in contact with impedance sensors   Symptom Correlation to Reflux Heartburn       Number of occurrences 65       Acid related 8       Weakly acidic related 12       Non-acid related 0       All reflux related 20       Unrelated 45    Reflux Symptom Index Heartburn       Acid reflux 12.3       Weakly acidic Reflux 18.5       Non-acid reflux 0.0       All reflux 30.8    Reflux Symptom Association Probability* Heartburn       Acid reflux 100.0       Weakly acidic reflux 100.0       Non-acid reflux 0.0       All reflux 100.0  * Probability that symptom and reflux are not associated solely by chance, (>95% is significant)   Proximal Esophageal Reflux*   Total 87.5  Acid 100.0  Weakly acidic 85.7  Non-acid N/A  * Percent of distal episodes   Procedure Description After confirmation of potential allergies, a topical analgesic was used to numb the nares followed by trans-nasal insertion of a High Resolution Manometry Catheter.  Pressure bands of both UES and LES were observed on the color contour.  Patient was instructed to take deep breath to verify placement of catheter; diaphragmatic pinch noted on inspiration. LES position was noted, and manometry probe removed.  PH probe was then inserted into patient nares, placement of probe was consistent with value of LES.  Probe was stablized by taping the nares.  Detailed instructions were given to patient regarding documentation of meals,  symptoms, and of supine positioning at home; patient verbalized understanding.  A printed pH diary was given to patient.  Patient was instucted to return to hospital in 24 hours with pH diary, to have probe removed.    Interpretation / Findings   There is acidic and weakly acidic reflux present - mostly post-prandial, and it has excellent correlation with heartburn symptoms. No complaints of cough during the study so no correlation with reflux.   Will try metoclopramide 10 mg AC/HS and see if that helps.     Dr  Silvano Rusk E sign 04/20/2014 1700 hrs

## 2014-05-19 ENCOUNTER — Ambulatory Visit: Payer: 59 | Admitting: Emergency Medicine

## 2014-06-20 ENCOUNTER — Encounter: Payer: Self-pay | Admitting: Internal Medicine

## 2014-06-20 ENCOUNTER — Ambulatory Visit (INDEPENDENT_AMBULATORY_CARE_PROVIDER_SITE_OTHER): Payer: Medicare Other | Admitting: Internal Medicine

## 2014-06-20 VITALS — BP 130/84 | HR 68 | Ht 63.0 in | Wt 220.1 lb

## 2014-06-20 DIAGNOSIS — R05 Cough: Secondary | ICD-10-CM

## 2014-06-20 DIAGNOSIS — K5909 Other constipation: Secondary | ICD-10-CM

## 2014-06-20 DIAGNOSIS — R109 Unspecified abdominal pain: Secondary | ICD-10-CM

## 2014-06-20 DIAGNOSIS — R053 Chronic cough: Secondary | ICD-10-CM

## 2014-06-20 DIAGNOSIS — K219 Gastro-esophageal reflux disease without esophagitis: Secondary | ICD-10-CM

## 2014-06-20 DIAGNOSIS — B182 Chronic viral hepatitis C: Secondary | ICD-10-CM

## 2014-06-20 DIAGNOSIS — R0781 Pleurodynia: Secondary | ICD-10-CM

## 2014-06-20 MED ORDER — POLYETHYLENE GLYCOL 3350 17 GM/SCOOP PO POWD: 17.0000 g | Freq: Every day | ORAL | Status: DC

## 2014-06-20 MED ORDER — MELOXICAM 15 MG PO TABS: 15.0000 mg | ORAL_TABLET | Freq: Every day | ORAL | Status: DC

## 2014-06-20 NOTE — Patient Instructions (Signed)
Please use Miralax daily.  We will obtain your past colonoscopy report from [facility] for Dr Carlean Purl to review.   Brace your abdominal wall when you cough.    I appreciate the opportunity to care for you.

## 2014-06-20 NOTE — Progress Notes (Signed)
   Subjective:    Patient ID: [name], female    DOB: Dec 30, 1950, 64 y.o.   MRN: [id number] Cc: abdominal pain HPI Had EGD and mano/impedance - some reflux despite PPI - tried metaclopramide but no different on that.  C/o constipation Sharp shooting abd pains in upper abdomen bilateral - Intermittent  Cough less than before  She had called prior GI MD - Britta Mccreedy - he said she needed a colonoscopy and should have it with me since she has come here. Medications, allergies, past medical history, past surgical history, family history and social history are reviewed and updated in the EMR.  Review of Systems As above    Objective:   Physical Exam BP 130/84 mmHg  Pulse 68  Ht 5' 3"  (1.6 m)  Wt 220 lb 2 oz (99.848 kg)  BMI 39.00 kg/m2 Lungs cta Cor s1 s2 no murmur Abdomen is obese soft and diffusely tender with increased sxs with abdominal wall tension and left lower ribs (ant and lateral) are also tender She is mildly anxious    Assessment & Plan:  Abdominal wall pain - Plan: meloxicam (MOBIC) 15 MG tablet  Rib pain on left side - Plan: meloxicam (MOBIC) 15 MG tablet  Chronic cough  Gastroesophageal reflux disease without esophagitis  Other constipation    I think abdominal wall pain is main issue and will treat with meloxicam and local measures. MiraLax for constipation Dc metaclopramide as not helpful   Review colon records - 03/19/2011 colonoscopy - diverticulosis w/ stricture in sigmoid   She can return to see me in 2 months I would not repeat a colonoscopy at this point.     Cc: Glenda Chroman., MD

## 2014-06-23 ENCOUNTER — Encounter: Payer: Self-pay | Admitting: Internal Medicine

## 2014-06-26 ENCOUNTER — Encounter (HOSPITAL_COMMUNITY): Payer: Self-pay | Admitting: Psychiatry

## 2014-06-26 ENCOUNTER — Ambulatory Visit (INDEPENDENT_AMBULATORY_CARE_PROVIDER_SITE_OTHER): Payer: 59 | Admitting: Psychiatry

## 2014-06-26 VITALS — BP 146/84 | HR 65 | Ht 63.0 in | Wt 219.6 lb

## 2014-06-26 DIAGNOSIS — F331 Major depressive disorder, recurrent, moderate: Secondary | ICD-10-CM | POA: Diagnosis not present

## 2014-06-26 DIAGNOSIS — F419 Anxiety disorder, unspecified: Secondary | ICD-10-CM | POA: Diagnosis not present

## 2014-06-26 MED ORDER — DULOXETINE HCL 60 MG PO CPEP: 60.0000 mg | ORAL_CAPSULE | Freq: Every day | ORAL | Status: DC

## 2014-06-26 MED ORDER — TRAZODONE HCL 100 MG PO TABS: 100.0000 mg | ORAL_TABLET | Freq: Every day | ORAL | Status: DC

## 2014-06-26 MED ORDER — ALPRAZOLAM 1 MG PO TABS: 1.0000 mg | ORAL_TABLET | Freq: Four times a day (QID) | ORAL | Status: DC

## 2014-06-26 NOTE — Progress Notes (Signed)
Patient ID: [name], female   DOB: [birth year], 64 y.o.   MRN: [id number] Patient ID: [name], female   DOB: Aug 09, 1950, 64 y.o.   MRN: [id number] Patient ID: [name], female   DOB: 1950-06-19, 64 y.o.   MRN: [id number] Patient ID: [name], female   DOB: September 09, 1950, 64 y.o.   MRN: [id number] Patient ID: [name], female   DOB: 1950/11/08, 64 y.o.   MRN: [id number] Patient ID: [name], female   DOB: August 25, 1950, 64 y.o.   MRN: [id number] Patient ID: [name], female   DOB: 1950-03-25, 64 y.o.   MRN: [id number] [facility] [id number] Progress Note [name] MRN: [id number] DOB: Mar 21, 1950 Age: 64 y.o.  Date: 06/26/2014 Start Time: 2:30 PM End Time: 2:45 PM  Chief Complaint: Chief Complaint  Patient presents with  . Depression  . Anxiety  . Follow-up    Subjective:  "I've been more depressed lately  This patient is a 64 year old widowed white female. She's currently living alone . She resides in [place]. She has 2 grown children-her daughter is in prison for substance possession in her son lives in [place]. She is on disability.  The patient has a long history of depression and anxiety. She generally does pretty well but she's had some bad news lately. Her primary doctors think she has hepatitis C. She went to a GI specialist today who drew a lot of labs. Her platelets are low and her spleen is enlarged but she's not sure what's going to happen from here. She does feel like her psychiatric medications have been helpful. She sleeping pretty well.  Given all these stressors she's holding up well.  The patient returns after 3 months. She states she feels tired and she complains of abdominal pain. She was supposed to have a: Cholecystectomy but her GI doctor elected not to do this right now. He thinks that she has abdominal wall pain. She is also stressed because her daughter has come out of prison and is living with her and doesn't have any way to financially  help out because she lost her disability. Her mood is been low and she doesn't think the Celexa is really helping anymore. I suggested we try Cymbalta because it helps both pain and depression and she agrees. She is sleeping fairly well with the trazodone the Xanax helps her anxiety a great deal. She does inform me that her hepatitis C is now in remission after 12 months of treatment Allergies: Allergies  Allergen Reactions  . Levothyroxine Hives    itching   Medical History: Past Medical History  Diagnosis Date  . Depression   . Anxiety   . Splenomegaly   . Hypertension   . COPD (chronic obstructive pulmonary disease)   . Asthma   . Hepatitis C   . Migraine headache   . Arthritis   . GERD (gastroesophageal reflux disease)   . Thyroid disease   . Diverticulitis 04/2012    [facility]  Surgical History: Past Surgical History  Procedure Laterality Date  . Abdominal hysterectomy    . Joint replacement    . Colon surgery    . Knee surgery Right     x4  . Esophageal manometry N/A 04/10/2014    Procedure: ESOPHAGEAL MANOMETRY (EM);  Surgeon: Gatha Mayer, MD;  Location: [facility] ENDOSCOPY;  Service: Endoscopy;  Laterality: N/A;  . Ph impedance study N/A 04/10/2014    Procedure: PH IMPEDANCE STUDY;  Surgeon: Gatha Mayer, MD;  Location: [facility];  Service: Endoscopy;  Laterality: N/A;  . Esophagogastroduodenoscopy    Family History family history includes Alcohol abuse in her father and paternal uncle; Anxiety disorder in her daughter; Bipolar disorder in her daughter; Dementia in her father and maternal grandmother; Depression in her brother and paternal uncle; Drug abuse in her brother. There is no history of ADD / ADHD. Reviewed again with no changes noted. Diagnosis:   Axis I: Anxiety Disorder NOS and Major Depression, Recurrent severe Axis II: Deferred Axis III:  Past Medical History  Diagnosis Date  . Depression   . Anxiety   . Splenomegaly   . Hypertension    . COPD (chronic obstructive pulmonary disease)   . Asthma   . Hepatitis C   . Migraine headache   . Arthritis   . GERD (gastroesophageal reflux disease)   . Thyroid disease   . Diverticulitis 04/2012    [facility]   Axis IV: other psychosocial or environmental problems Axis V: 51-60 moderate symptoms  ADL's:  Intact  Sleep: Good  Appetite:  Good  Suicidal Ideation:  Pt denies any thoughts, plans, intent of suicide Homicidal Ideation:  Pt denies any thoughts, plans, intent of homicide AEB (as evidenced by):per pt report  Psychiatric Specialty Exam: ROS ROS: Neuro: no headaches, ataxia, weakness GI: no N/V/D/cramps/constipation MS: no weakness, muscle cramps, aches, but does have joint pain from arthritis.    Blood pressure 146/84, pulse 65, height 5' 3"  (1.6 m), weight 219 lb 9.6 oz (99.61 kg).Body mass index is 38.91 kg/(m^2).  General Appearance: Casual  Eye Contact::  Good  Speech:  Clear and Coherent  Volume:  Normal  Mood:  Anxious and depressed   Affect: Constricted   Thought Process:  Coherent, Intact and Logical  Orientation:  Full (Time, Place, and Person)  Thought Content:  WDL  Suicidal Thoughts:  No  Homicidal Thoughts:  No  Memory:  Immediate;   Fair Recent;   Fair Remote;   Fair  Judgement:  Good  Insight:  Good  Psychomotor Activity:  Normal  Concentration:  Good  Recall:  Good  Akathisia:  No  Handed:  Ambidextrous  AIMS (if indicated):     Assets:  Communication Skills Desire for Improvement  Sleep:      Current Medications: Current Outpatient Prescriptions  Medication Sig Dispense Refill  . ALPRAZolam (XANAX) 1 MG tablet Take 1 tablet (1 mg total) by mouth 4 (four) times daily. 120 tablet 2  . Butalbital-APAP-Caffeine 50-300-40 MG CAPS Take by mouth as needed.    [name] Calcium (STOOL SOFTENER PO) Take by mouth daily.    . fluticasone (FLONASE) 50 MCG/ACT nasal spray Place 2 sprays into both nostrils 2 (two) times  daily. 16 g 12  . loratadine (CLARITIN) 10 MG tablet Take 1 tablet (10 mg total) by mouth daily. 30 tablet 4  . meloxicam (MOBIC) 15 MG tablet Take 1 tablet (15 mg total) by mouth daily. 30 tablet 1  . metoprolol succinate (TOPROL-XL) 50 MG 24 hr tablet Take 50 mg by mouth daily. Take with or immediately following a meal.    . NEXIUM 40 MG capsule Take 40 mg by mouth 2 (two) times daily before a meal.  11  . nystatin (MYCOSTATIN) powder   2  . polyethylene glycol powder (MIRALAX) powder Take 17 g by mouth daily. 255 g 0  . ranitidine (ZANTAC) 300 MG tablet Take 300 mg by mouth at  bedtime.  3  . Simethicone (GAS RELIEF PO) Take by mouth as needed.    . traZODone (DESYREL) 100 MG tablet Take 1 tablet (100 mg total) by mouth at bedtime. 30 tablet 2  . DULoxetine (CYMBALTA) 60 MG capsule Take 1 capsule (60 mg total) by mouth daily. 30 capsule 2   No current facility-administered medications for this visit.  Lab Results:  Results for orders placed or performed in visit on 02/01/14 (from the past 4032 hour(s))  CBC with Differential   Collection Time: 02/01/14 12:02 PM  Result Value Ref Range   WBC 6.0 4.0 - 10.5 K/uL   RBC 3.75 (L) 3.87 - 5.11 Mil/uL   Hemoglobin 13.1 12.0 - 15.0 g/dL   HCT 38.2 36.0 - 46.0 %   MCV 102.1 (H) 78.0 - 100.0 fl   MCHC 34.4 30.0 - 36.0 g/dL   RDW 15.1 11.5 - 15.5 %   Platelets 110.0 (L) 150.0 - 400.0 K/uL   Neutrophils Relative % 67.3 43.0 - 77.0 %   Lymphocytes Relative 19.8 12.0 - 46.0 %   Monocytes Relative 9.8 3.0 - 12.0 %   Eosinophils Relative 2.5 0.0 - 5.0 %   Basophils Relative 0.6 0.0 - 3.0 %   Neutro Abs 4.0 1.4 - 7.7 K/uL   Lymphs Abs 1.2 0.7 - 4.0 K/uL   Monocytes Absolute 0.6 0.1 - 1.0 K/uL   Eosinophils Absolute 0.2 0.0 - 0.7 K/uL   Basophils Absolute 0.0 0.0 - 0.1 K/uL  Comp Met (CMET)   Collection Time: 02/01/14 12:02 PM  Result Value Ref Range   Sodium 138 135 - 145 mEq/L   Potassium 4.2 3.5 - 5.1 mEq/L   Chloride 104 96 - 112 mEq/L    CO2 27 19 - 32 mEq/L   Glucose, Bld 102 (H) 70 - 99 mg/dL   BUN 15 6 - 23 mg/dL   Creatinine, Ser 0.8 0.4 - 1.2 mg/dL   Total Bilirubin 0.7 0.2 - 1.2 mg/dL   Alkaline Phosphatase 72 39 - 117 U/L   AST 25 0 - 37 U/L   ALT 25 0 - 35 U/L   Total Protein 7.3 6.0 - 8.3 g/dL   Albumin 4.2 3.5 - 5.2 g/dL   Calcium 9.3 8.4 - 10.5 mg/dL   GFR 74.75 >60.00 mL/min  Lipase   Collection Time: 02/01/14 12:02 PM  Result Value Ref Range   Lipase 22.0 11.0 - 59.0 U/L   . Physical Findings: AIMS:  , ,  ,  ,    CIWA:    COWS:    Plan: I took her vitals.  I reviewed CC, tobacco/med/surg Hx, meds effects/ side effects, problem list, therapies and responses as well as current situation/symptoms discussed options. She will continue Xanax and trazodone. She will discontinue Celexa and start Cymbalta 60 mg daily. She'll return in 6 weeks See orders and pt instructions for more details. Meds ordered this encounter  Medications  . traZODone (DESYREL) 100 MG tablet    Sig: Take 1 tablet (100 mg total) by mouth at bedtime.    Dispense:  30 tablet    Refill:  2  . DULoxetine (CYMBALTA) 60 MG capsule    Sig: Take 1 capsule (60 mg total) by mouth daily.    Dispense:  30 capsule    Refill:  2  . DISCONTD: ALPRAZolam (XANAX) 1 MG tablet    Sig: Take 1 tablet (1 mg total) by mouth 4 (four) times daily.  Dispense:  120 tablet    Refill:  2  . ALPRAZolam (XANAX) 1 MG tablet    Sig: Take 1 tablet (1 mg total) by mouth 4 (four) times daily.    Dispense:  120 tablet    Refill:  2  Medical Decision Making Problem Points:  Established problem, stable/improving (1), Review of last therapy session (1) and Review of psycho-social stressors (1) Data Points:  Review of medication regiment & side effects (2) I certify that outpatient services furnished can reasonably be expected to improve the patient's condition.  ROSS, [facility] 06/26/2014, 1:30 PM

## 2014-07-04 ENCOUNTER — Telehealth: Payer: Self-pay | Admitting: Internal Medicine

## 2014-07-04 NOTE — Telephone Encounter (Signed)
Patient advised that according to the last office notes he reviewed the colonoscopy and she is to follow up in 2 months

## 2014-07-11 ENCOUNTER — Ambulatory Visit (INDEPENDENT_AMBULATORY_CARE_PROVIDER_SITE_OTHER): Payer: Medicare Other | Admitting: Emergency Medicine

## 2014-07-11 ENCOUNTER — Encounter: Payer: Self-pay | Admitting: Emergency Medicine

## 2014-07-11 VITALS — BP 122/88 | HR 66 | Ht 63.0 in | Wt 216.0 lb

## 2014-07-11 DIAGNOSIS — R05 Cough: Secondary | ICD-10-CM | POA: Diagnosis not present

## 2014-07-11 DIAGNOSIS — R053 Chronic cough: Secondary | ICD-10-CM

## 2014-07-11 MED ORDER — METOCLOPRAMIDE HCL 10 MG PO TABS: ORAL_TABLET | ORAL | Status: DC

## 2014-07-11 MED ORDER — FLUTICASONE PROPIONATE 50 MCG/ACT NA SUSP: 2.0000 | Freq: Two times a day (BID) | NASAL | Status: DC

## 2014-07-11 NOTE — Addendum Note (Signed)
Addended by: Desmond Dike C on: 07/11/2014 11:14 AM   Modules accepted: Orders

## 2014-07-11 NOTE — Assessment & Plan Note (Signed)
Continues to have significant GERD component based on her pH study. It was recommended that she start Reglan and I will do this today. She will continue her Nexium and Zantac. I will also ask her start taking her Flonase daily instead of when necessary. Continue loratadine. We will not restart bronchodilators at this time as her spirometry was reassuring

## 2014-07-11 NOTE — Patient Instructions (Addendum)
Please continue your loratadine, Zantac and Nexium as she had been taking them. Start using her Flonase 2 sprays each nostril every day We will start Reglan 10 mg 3 times a day before meals and then before bedtime Follow with Dr Carlean Purl as planned Follow with Dr Lamonte Sakai in 3 months or sooner if you have any problems.

## 2014-07-11 NOTE — Progress Notes (Signed)
Subjective:    Patient ID: [name], female    DOB: [birth year], 64 y.o.   MRN: [id number]  Asthma She complains of cough. There is no shortness of breath or wheezing. Associated symptoms include headaches. Pertinent negatives include no chest pain, ear pain, fever, postnasal drip, rhinorrhea, sneezing, sore throat or trouble swallowing. Her past medical history is significant for asthma.   64 year old never smoker with a history of hypertension,hepatitis C, anxiety, childhood asthma for which she was treated with inhaled BD's. She carries a diagnosis of COPD although she has no smoking history, more likely some fixed asthma. The dx was given clinically. She has GERD symptoms previously treated with omeprazole and currently treated with Zantac but still has severe GERD. She is referred for chronic cough. She has been treated with several courses of steroids, abx. She has been on Advair for 2 years. She has been seen Dr Redmond Pulling, ENT 2 months ago in [place]. She uses albuterol nebs prn > about 1-2x a month. She had a CXR 4 weeks ago at [facility].   ROV 01/06/14 -- follow up visit for chronic cough and suspected asthma. Last time we stop the Advair to see if this would help her cough. We also added a PPI to her ranitidine and started loratadine, fluticasone nasal spray. She returns today after PFT - no obstruction, normal volumes. The regimen we started did decrease her cough some, but she still has a barking cough. She has had ENT sinus sgy before, little improvement (7/'15). She has done [place] before but not currently. She still has some GERD even on higher dose Nexium. She has seen Dr Carlean Purl before.   ROV 07/11/14 -- follow-up visit for chronic cough in the setting of severe GERD and also rhinitis. She underwent an EGD in January with Dr. Carlean Purl. Also had a pH impedence study done that identified low level acidic and nonacidic reflux that corresponded with her GERD symptoms. It was recommended that  she be started on Reglan in response to this. Her cough is a bit better off the Advair. She is using loratadine qd, flonase but only prn.     Review of Systems  Constitutional: Negative for fever and unexpected weight change.  HENT: Positive for congestion. Negative for dental problem, ear pain, nosebleeds, postnasal drip, rhinorrhea, sinus pressure, sneezing, sore throat and trouble swallowing.   Eyes: Negative for redness and itching.  Respiratory: Positive for cough. Negative for chest tightness, shortness of breath and wheezing.   Cardiovascular: Negative for chest pain, palpitations and leg swelling.  Gastrointestinal: Negative for nausea, vomiting and abdominal pain.       Acid heartburn/indigestion  Genitourinary: Negative for dysuria.  Musculoskeletal: Positive for arthralgias. Negative for joint swelling.  Skin: Positive for rash.  Neurological: Positive for headaches.  Hematological: Does not bruise/bleed easily.  Psychiatric/Behavioral: Negative for dysphoric mood. The patient is nervous/anxious.     Past Medical History  Diagnosis Date  . Depression   . Anxiety   . Splenomegaly   . Hypertension   . COPD (chronic obstructive pulmonary disease)   . Asthma   . Hepatitis C   . Migraine headache   . Arthritis   . GERD (gastroesophageal reflux disease)   . Thyroid disease   . Diverticulitis 04/2012    [facility]     Family History  Problem Relation Age of Onset  . Alcohol abuse Father   . Dementia Father   . Dementia Maternal Grandmother   .  Bipolar disorder Daughter   . Anxiety disorder Daughter   . Depression Brother   . Drug abuse Brother   . Alcohol abuse Paternal Uncle   . Depression Paternal Uncle   . ADD / ADHD Neg Hx      History   Social History  . Marital Status: Widowed    Spouse Name: N/A  . Number of Children: 2  . Years of Education: 12th grade   Occupational History  . RETIRED    Social History Main Topics  . Smoking  status: Never Smoker   . Smokeless tobacco: Never Used  . Alcohol Use: 1.8 oz/week    0 Standard drinks or equivalent, 3 Cans of beer per week     Comment: 2-3 drinks per week  . Drug Use: No  . Sexual Activity: No   Other Topics Concern  . Not on file   Social History Narrative     Allergies  Allergen Reactions  . Levothyroxine Hives    itching     Outpatient Prescriptions Prior to Visit  Medication Sig Dispense Refill  . ALPRAZolam (XANAX) 1 MG tablet Take 1 tablet (1 mg total) by mouth 4 (four) times daily. 120 tablet 2  . Butalbital-APAP-Caffeine 50-300-40 MG CAPS Take by mouth as needed.    [name] Calcium (STOOL SOFTENER PO) Take by mouth daily.    . DULoxetine (CYMBALTA) 60 MG capsule Take 1 capsule (60 mg total) by mouth daily. 30 capsule 2  . fluticasone (FLONASE) 50 MCG/ACT nasal spray Place 2 sprays into both nostrils 2 (two) times daily. 16 g 12  . loratadine (CLARITIN) 10 MG tablet Take 1 tablet (10 mg total) by mouth daily. 30 tablet 4  . meloxicam (MOBIC) 15 MG tablet Take 1 tablet (15 mg total) by mouth daily. 30 tablet 1  . metoprolol succinate (TOPROL-XL) 50 MG 24 hr tablet Take 50 mg by mouth daily. Take with or immediately following a meal.    . NEXIUM 40 MG capsule Take 40 mg by mouth 2 (two) times daily before a meal.  11  . nystatin (MYCOSTATIN) powder   2  . polyethylene glycol powder (MIRALAX) powder Take 17 g by mouth daily. 255 g 0  . ranitidine (ZANTAC) 300 MG tablet Take 300 mg by mouth at bedtime.  3  . Simethicone (GAS RELIEF PO) Take by mouth as needed.    . traZODone (DESYREL) 100 MG tablet Take 1 tablet (100 mg total) by mouth at bedtime. 30 tablet 2   No facility-administered medications prior to visit.         Objective:   Physical Exam Filed Vitals:   07/11/14 1040  BP: 122/88  Pulse: 66  Height: 5' 3"  (1.6 m)  Weight: 216 lb (97.977 kg)  SpO2: 97%   Gen: Pleasant, well-nourished, in no distress,  normal affect  ENT: No  lesions,  mouth clear,  oropharynx clear, some difficulty moving air through her nose  Neck: No JVD, no TMG, no carotid bruits  Lungs: No use of accessory muscles, clear without rales or rhonchi  Cardiovascular: RRR, heart sounds normal, no murmur or gallops, no peripheral edema  Musculoskeletal: No deformities, no cyanosis or clubbing  Neuro: alert, non focal  Skin: Warm, no lesions or rashes       Assessment & Plan:  Chronic cough Continues to have significant GERD component based on her pH study. It was recommended that she start Reglan and I will do this today. She will  continue her Nexium and Zantac. I will also ask her start taking her Flonase daily instead of when necessary. Continue loratadine. We will not restart bronchodilators at this time as her spirometry was reassuring

## 2014-08-04 ENCOUNTER — Encounter (HOSPITAL_COMMUNITY): Payer: Self-pay | Admitting: Psychiatry

## 2014-08-04 ENCOUNTER — Ambulatory Visit (INDEPENDENT_AMBULATORY_CARE_PROVIDER_SITE_OTHER): Payer: 59 | Admitting: Psychiatry

## 2014-08-04 VITALS — BP 120/88 | Ht 63.0 in | Wt 219.0 lb

## 2014-08-04 DIAGNOSIS — F331 Major depressive disorder, recurrent, moderate: Secondary | ICD-10-CM | POA: Diagnosis not present

## 2014-08-04 MED ORDER — TRAZODONE HCL 100 MG PO TABS: 100.0000 mg | ORAL_TABLET | Freq: Every day | ORAL | Status: DC

## 2014-08-04 MED ORDER — ESCITALOPRAM OXALATE 20 MG PO TABS: 20.0000 mg | ORAL_TABLET | Freq: Every day | ORAL | Status: DC

## 2014-08-04 NOTE — Psych (Signed)
[place] MD/PA/NP OP Progress Note  08/04/2014 1:14 PM [name]  MRN:  [id number]  Subjective:This patient is a 64 year old widowed white female. She's currently living alone . She resides in [place]. She has 2 grown children-her daughter is in prison for substance possession in her son lives in [place]. She is on disability.  The patient has a long history of depression and anxiety. She generally does pretty well but she's had some bad news lately. Her primary doctors think she has hepatitis C. She went to a GI specialist today who drew a lot of labs. Her platelets are low and her spleen is enlarged but she's not sure what's going to happen from here. She does feel like her psychiatric medications have been helpful. She sleeping pretty well. Given all these stressors she's holding up well.  The patient returns after 4 weeks. Last time she was more depressed so we started Cymbalta and discontinued Celexa. She claims she feels even worse. She states the Cymbalta makes her fuzzy and she's more forgetful and scatterbrained. She's not sleeping well. She is worried about her daughter who is now out of prison and living with her. The daughter could not get back on disability or get Medicaid so the patient is paying for her medical bills. 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 She states Celexa stopped working after a number of years so perhaps we can try Lexapro which is similar. She denies suicidal ideation  Chief Complaint:  Chief Complaint    Depression     Visit Diagnosis:     ICD-9-CM ICD-10-CM   1. Major depressive disorder, recurrent episode, moderate 296.32 F33.1     Past Medical History:  Past Medical History  Diagnosis Date  . Depression   . Anxiety   . Splenomegaly   . Hypertension   . COPD (chronic obstructive pulmonary disease)   . Asthma   . Hepatitis C   . Migraine headache   . Arthritis   . GERD (gastroesophageal reflux disease)   . Thyroid disease    . Diverticulitis 04/2012    [facility]    Past Surgical History  Procedure Laterality Date  . Abdominal hysterectomy    . Joint replacement    . Colon surgery    . Knee surgery Right     x4  . Esophageal manometry N/A 04/10/2014    Procedure: ESOPHAGEAL MANOMETRY (EM);  Surgeon: Gatha Mayer, MD;  Location: [facility];  Service: Endoscopy;  Laterality: N/A;  . Ph impedance study N/A 04/10/2014    Procedure: [place] IMPEDANCE STUDY;  Surgeon: Gatha Mayer, MD;  Location: [facility];  Service: Endoscopy;  Laterality: N/A;  . Esophagogastroduodenoscopy     Family History:  Family History  Problem Relation Age of Onset  . Alcohol abuse Father   . Dementia Father   . Dementia Maternal Grandmother   . Bipolar disorder Daughter   . Anxiety disorder Daughter   . Depression Brother   . Drug abuse Brother   . Alcohol abuse Paternal Uncle   . Depression Paternal Uncle   . ADD / ADHD Neg Hx    Social History:  History   Social History  . Marital Status: Widowed    Spouse Name: N/A  . Number of Children: 2  . Years of Education: 12th grade   Occupational History  . RETIRED    Social History Main Topics  . Smoking status: Never Smoker   . Smokeless  tobacco: Never Used  . Alcohol Use: 1.8 oz/week    0 Standard drinks or equivalent, 3 Cans of beer per week     Comment: 2-3 drinks per week  . Drug Use: No  . Sexual Activity: No   Other Topics Concern  . None   Social History Narrative   Additional History:  Assessment: The patient is not doing well on Cymbalta. We will need to switch to something more similar to Celexa such as Lexapro  Musculoskeletal: Strength & Muscle Tone: within normal limits Gait & Station: normal Patient leans: N/A  Psychiatric Specialty Exam: HPI  Review of Systems  Respiratory: Positive for shortness of breath.   Musculoskeletal: Positive for myalgias.  Psychiatric/Behavioral: Positive for depression. The patient is  nervous/anxious.   All other systems reviewed and are negative.   Blood pressure 120/88, height 5' 3"  (1.6 m), weight 99.338 kg (219 lb).Body mass index is 38.8 kg/(m^2).  General Appearance: Casual, Neat and Well Groomed  Eye Contact:  Good  Speech:  Clear and Coherent  Volume:  Decreased  Mood:  Depressed  Affect:  Constricted and Depressed  Thought Process:  Goal Directed  Orientation:  Full (Time, Place, and Person)  Thought Content:  Rumination  Suicidal Thoughts:  No  Homicidal Thoughts:  No  Memory:  Immediate;   Fair Recent;   Fair Remote;   Fair  Judgement:  Fair  Insight:  Lacking  Psychomotor Activity:  Decreased  Concentration:  Fair  Recall:  Poor  Fund of Knowledge: Good  Language: Good  Akathisia:  No  Handed:  Right  AIMS (if indicated):    Assets:  Communication Skills Desire for Improvement Resilience Social Support  ADL's:  Intact  Cognition: WNL  Sleep:  Poor    Is the patient at risk to self?  No. Has the patient been a risk to self in the past 6 months?  No. Has the patient been a risk to self within the distant past?  No. Is the patient a risk to others?  No. Has the patient been a risk to others in the past 6 months?  No. Has the patient been a risk to others within the distant past?  No.  Current Medications: Current Outpatient Prescriptions  Medication Sig Dispense Refill  . ALPRAZolam (XANAX) 1 MG tablet Take 1 tablet (1 mg total) by mouth 4 (four) times daily. 120 tablet 2  . Butalbital-APAP-Caffeine 50-300-40 MG CAPS Take by mouth as needed.    [name] Calcium (STOOL SOFTENER PO) Take by mouth daily.    Marland Kitchen escitalopram (LEXAPRO) 20 MG tablet Take 1 tablet (20 mg total) by mouth daily. 30 tablet 2  . fluticasone (FLONASE) 50 MCG/ACT nasal spray Place 2 sprays into both nostrils 2 (two) times daily. 16 g 5  . loratadine (CLARITIN) 10 MG tablet Take 1 tablet (10 mg total) by mouth daily. 30 tablet 4  . meloxicam (MOBIC) 15 MG tablet Take  1 tablet (15 mg total) by mouth daily. 30 tablet 1  . metoCLOPramide (REGLAN) 10 MG tablet Take 1 tablet every morning with breakfast and at bedtime 60 tablet 2  . metoprolol succinate (TOPROL-XL) 50 MG 24 hr tablet Take 50 mg by mouth daily. Take with or immediately following a meal.    . NEXIUM 40 MG capsule Take 40 mg by mouth 2 (two) times daily before a meal.  11  . nystatin (MYCOSTATIN) powder   2  . polyethylene glycol powder (MIRALAX) powder Take  17 g by mouth daily. 255 g 0  . ranitidine (ZANTAC) 300 MG tablet Take 300 mg by mouth at bedtime.  3  . Simethicone (GAS RELIEF PO) Take by mouth as needed.    . traZODone (DESYREL) 100 MG tablet Take 1 tablet (100 mg total) by mouth at bedtime. 30 tablet 2   No current facility-administered medications for this visit.    Medical Decision Making:  Established Problem, Worsening (2), Review of Last Therapy Session (1), Review of Medication Regimen & Side Effects (2) and Review of New Medication or Change in Dosage (2)  Treatment Plan Summary:Medication management patient will discontinue Cymbalta and start Lexapro 20 mg daily for depression. She'll continue trazodone for sleep and Xanax for anxiety. She'll return in 6 weeks or call sooner if symptoms worsen ROSS, [facility] 08/04/2014, 1:14 PM

## 2014-09-14 ENCOUNTER — Ambulatory Visit (INDEPENDENT_AMBULATORY_CARE_PROVIDER_SITE_OTHER): Payer: Medicare Other | Admitting: Internal Medicine

## 2014-09-14 ENCOUNTER — Encounter: Payer: Self-pay | Admitting: Internal Medicine

## 2014-09-14 ENCOUNTER — Other Ambulatory Visit (INDEPENDENT_AMBULATORY_CARE_PROVIDER_SITE_OTHER): Payer: Medicare Other

## 2014-09-14 VITALS — BP 126/68 | HR 91 | Temp 97.6°F | Ht 63.0 in | Wt 216.0 lb

## 2014-09-14 DIAGNOSIS — R140 Abdominal distension (gaseous): Secondary | ICD-10-CM | POA: Diagnosis not present

## 2014-09-14 DIAGNOSIS — R109 Unspecified abdominal pain: Secondary | ICD-10-CM

## 2014-09-14 LAB — CBC WITH DIFFERENTIAL/PLATELET
BASOS ABS: 0 10*3/uL (ref 0.0–0.1)
BASOS PCT: 0.5 % (ref 0.0–3.0)
EOS PCT: 1.7 % (ref 0.0–5.0)
Eosinophils Absolute: 0.1 10*3/uL (ref 0.0–0.7)
HEMATOCRIT: 39.8 % (ref 36.0–46.0)
Hemoglobin: 13.8 g/dL (ref 12.0–15.0)
LYMPHS ABS: 1.2 10*3/uL (ref 0.7–4.0)
LYMPHS PCT: 22.7 % (ref 12.0–46.0)
MCHC: 34.8 g/dL (ref 30.0–36.0)
MCV: 98.9 fl (ref 78.0–100.0)
MONO ABS: 0.4 10*3/uL (ref 0.1–1.0)
Monocytes Relative: 7.8 % (ref 3.0–12.0)
Neutro Abs: 3.5 10*3/uL (ref 1.4–7.7)
Neutrophils Relative %: 67.3 % (ref 43.0–77.0)
PLATELETS: 109 10*3/uL — AB (ref 150.0–400.0)
RBC: 4.02 Mil/uL (ref 3.87–5.11)
RDW: 14.2 % (ref 11.5–15.5)
WBC: 5.2 10*3/uL (ref 4.0–10.5)

## 2014-09-14 LAB — COMPREHENSIVE METABOLIC PANEL
ALBUMIN: 4.4 g/dL (ref 3.5–5.2)
ALK PHOS: 85 U/L (ref 39–117)
ALT: 21 U/L (ref 0–35)
AST: 22 U/L (ref 0–37)
BUN: 16 mg/dL (ref 6–23)
CO2: 27 meq/L (ref 19–32)
CREATININE: 0.8 mg/dL (ref 0.40–1.20)
Calcium: 10.3 mg/dL (ref 8.4–10.5)
Chloride: 106 mEq/L (ref 96–112)
GFR: 76.76 mL/min (ref >60.00)
Glucose, Bld: 119 mg/dL — ABNORMAL HIGH (ref 70–99)
Potassium: 4 mEq/L (ref 3.5–5.1)
Sodium: 140 mEq/L (ref 135–145)
Total Bilirubin: 0.4 mg/dL (ref 0.2–1.2)
Total Protein: 7.6 g/dL (ref 6.0–8.3)

## 2014-09-14 LAB — LIPASE: Lipase: 18 U/L (ref 11.0–59.0)

## 2014-09-14 LAB — AMYLASE: Amylase: 21 U/L — ABNORMAL LOW (ref 27–131)

## 2014-09-14 MED ORDER — HYDROCODONE-ACETAMINOPHEN 5-325 MG PO TABS: 1.0000 | ORAL_TABLET | Freq: Four times a day (QID) | ORAL | Status: DC | PRN

## 2014-09-14 NOTE — Patient Instructions (Signed)
   Your physician has requested that you go to the basement for the following lab work before leaving today: CMET, CBC/diff, Amylase, Lipase  Today we are giving you a rx for Vicodin to take to your pharmacy.   We are going to refer you to [facility] in [place].   I appreciate the opportunity to care for you. Silvano Rusk, MD, [facility]

## 2014-09-14 NOTE — Progress Notes (Signed)
Subjective:    Patient ID: [name], female    DOB: 03/11/1951, 64 y.o.   MRN: [id number] Cc: abdominal pain HPI  Still having terrible abdominal pain, esp upper. Worse with movement and twisting. Says meloxicam has helped but struggling to sleep. Bowels ok. Some nausea, no vomiting. Feels constantly bloated. Allergies  Allergen Reactions  . Levothyroxine Hives    itching   Outpatient Prescriptions Prior to Visit  Medication Sig Dispense Refill  . ALPRAZolam (XANAX) 1 MG tablet Take 1 tablet (1 mg total) by mouth 4 (four) times daily. 120 tablet 2  . Butalbital-APAP-Caffeine 50-300-40 MG CAPS Take by mouth as needed.    [name] Calcium (STOOL SOFTENER PO) Take by mouth daily.    Marland Kitchen escitalopram (LEXAPRO) 20 MG tablet Take 1 tablet (20 mg total) by mouth daily. 30 tablet 2  . loratadine (CLARITIN) 10 MG tablet Take 1 tablet (10 mg total) by mouth daily. 30 tablet 4  . meloxicam (MOBIC) 15 MG tablet Take 1 tablet (15 mg total) by mouth daily. 30 tablet 1  . metoCLOPramide (REGLAN) 10 MG tablet Take 1 tablet every morning with breakfast and at bedtime 60 tablet 2  . metoprolol succinate (TOPROL-XL) 50 MG 24 hr tablet Take 50 mg by mouth daily. Take with or immediately following a meal.    . nystatin (MYCOSTATIN) powder   2  . polyethylene glycol powder (MIRALAX) powder Take 17 g by mouth daily. 255 g 0  . ranitidine (ZANTAC) 300 MG tablet Take 300 mg by mouth at bedtime.  3  . Simethicone (GAS RELIEF PO) Take by mouth as needed.    . traZODone (DESYREL) 100 MG tablet Take 1 tablet (100 mg total) by mouth at bedtime. 30 tablet 2  . fluticasone (FLONASE) 50 MCG/ACT nasal spray Place 2 sprays into both nostrils 2 (two) times daily. 16 g 5  . NEXIUM 40 MG capsule Take 40 mg by mouth 2 (two) times daily before a meal.  11   No facility-administered medications prior to visit.   Past Medical History  Diagnosis Date  . Depression   . Anxiety   . Splenomegaly   . Hypertension   .  COPD (chronic obstructive pulmonary disease)   . Asthma   . Hepatitis C   . Migraine headache   . Arthritis   . GERD (gastroesophageal reflux disease)   . Thyroid disease   . Diverticulitis 04/2012    [facility]   Past Surgical History  Procedure Laterality Date  . Abdominal hysterectomy    . Joint replacement    . Colon surgery    . Knee surgery Right     x4  . Esophageal manometry N/A 04/10/2014    Procedure: ESOPHAGEAL MANOMETRY (EM);  Surgeon: Gatha Mayer, MD;  Location: [facility];  Service: Endoscopy;  Laterality: N/A;  . Ph impedance study N/A 04/10/2014    Procedure: [place] IMPEDANCE STUDY;  Surgeon: Gatha Mayer, MD;  Location: [facility];  Service: Endoscopy;  Laterality: N/A;  . Esophagogastroduodenoscopy     History   Social History  . Marital Status: Widowed    Spouse Name: N/A  . Number of Children: 2  . Years of Education: 12th grade   Occupational History  . RETIRED    Social History Main Topics  . Smoking status: Never Smoker   . Smokeless tobacco: Never Used  . Alcohol Use: 1.8 oz/week    0 Standard drinks or equivalent, 3 Cans  of beer per week     Comment: 2-3 drinks per week  . Drug Use: No  . Sexual Activity: No   Other Topics Concern  . None   Social History Narrative   Family History  Problem Relation Age of Onset  . Alcohol abuse Father   . Dementia Father   . Dementia Maternal Grandmother   . Bipolar disorder Daughter   . Anxiety disorder Daughter   . Depression Brother   . Drug abuse Brother   . Alcohol abuse Paternal Uncle   . Depression Paternal Uncle   . ADD / ADHD Neg Hx        Review of Systems Anxious, sleep-deprived, frustrated    Objective:   Physical Exam @BP  126/68 mmHg  Pulse 91  Temp(Src) 97.6 F (36.4 C)  Ht 5' 3"  (1.6 m)  Wt 216 lb (97.977 kg)  BMI 38.27 kg/m2@  General:  NAD Eyes:   anicteric Lungs:  clear Heart:: S1S2 no rubs, murmurs or gallops Abdomen:  soft and tender to light  touch RUQ>LUQ. Worse w/ muscle tension, BS+ Ext:   no edema, cyanosis or clubbing    Data Reviewed:   Wt Readings from Last 3 Encounters:  09/14/14 216 lb (97.977 kg)  08/04/14 219 lb (99.338 kg)  07/11/14 216 lb (97.977 kg)       Assessment & Plan:  Abdominal wall pain - Plan: Comprehensive metabolic panel, CBC with Differential/Platelet, Amylase, Lipase  Bloating - Plan: Comprehensive metabolic panel, CBC with Differential/Platelet, Amylase, Lipase  1) She seems miserable - this looks like wall pain and not gut pain 2) Short-term Vicodin # 45 5/325 mg. Hopefully will sleep some with this 3) Refer to [facility] - ? Abdominal wall injection possible 4) Labs  Lab Results  Component Value Date   WBC 5.2 09/14/2014   HGB 13.8 09/14/2014   HCT 39.8 09/14/2014   MCV 98.9 09/14/2014   PLT 109.0* 09/14/2014   Lab Results  Component Value Date   CREATININE 0.80 09/14/2014   BUN 16 09/14/2014   NA 140 09/14/2014   K 4.0 09/14/2014   CL 106 09/14/2014   CO2 27 09/14/2014   Lab Results  Component Value Date   ALT 21 09/14/2014   AST 22 09/14/2014   ALKPHOS 85 09/14/2014   BILITOT 0.4 09/14/2014   Lab Results  Component Value Date   LIPASE 18.0 09/14/2014   Lab Results  Component Value Date   AMYLASE 21* 09/14/2014    OH:KGOV,PCHEK B., MD [place]

## 2014-09-15 ENCOUNTER — Encounter: Payer: Self-pay | Admitting: Internal Medicine

## 2014-09-15 ENCOUNTER — Ambulatory Visit (HOSPITAL_COMMUNITY): Payer: Self-pay | Admitting: Psychiatry

## 2014-09-15 NOTE — Progress Notes (Signed)
Patient ID: [name], female   DOB: Apr 05, 1950, 64 y.o.   MRN: [id number] Faxed a new patient referral intake form to [facility] at fax # [phone] along with our office notes, imaging, procedure notes, lab work to try and get her an appointment with Dr. Jerene Bears.

## 2014-09-19 NOTE — Progress Notes (Signed)
Quick Note:  Labs are ok - slightly low PLTs as she has had - not causing problems' Await pain clinic eval Hope she is getting some relief ______

## 2014-09-27 ENCOUNTER — Telehealth (HOSPITAL_COMMUNITY): Payer: Self-pay | Admitting: *Deleted

## 2014-09-27 ENCOUNTER — Other Ambulatory Visit (HOSPITAL_COMMUNITY): Payer: Self-pay | Admitting: Psychiatry

## 2014-09-27 MED ORDER — ESCITALOPRAM OXALATE 20 MG PO TABS: 20.0000 mg | ORAL_TABLET | Freq: Every day | ORAL | Status: DC

## 2014-09-27 MED ORDER — TRAZODONE HCL 100 MG PO TABS: 100.0000 mg | ORAL_TABLET | Freq: Every day | ORAL | Status: DC

## 2014-09-27 NOTE — Telephone Encounter (Signed)
phone call from patient, out of depression medicine, she don't know name of two of the meds she is out of.   also need Xanax.   She says she is out of another medicine, but can't think of what it is.

## 2014-09-27 NOTE — Telephone Encounter (Signed)
lexaproand trazodone sent it. Please route to [facility] top check with pharmacy about xanax-she should of had refills

## 2014-09-28 ENCOUNTER — Other Ambulatory Visit (HOSPITAL_COMMUNITY): Payer: Self-pay | Admitting: *Deleted

## 2014-09-28 MED ORDER — ALPRAZOLAM 1 MG PO TABS: 1.0000 mg | ORAL_TABLET | Freq: Four times a day (QID) | ORAL | Status: DC

## 2014-09-28 NOTE — Telephone Encounter (Signed)
Chart reviewed. Dr Harrington Challenger approved 30 day supply of Xanax. Called in to pharmacy.

## 2014-10-05 ENCOUNTER — Encounter: Payer: Self-pay | Admitting: Internal Medicine

## 2014-10-05 NOTE — Progress Notes (Signed)
Patient ID: [name], female   DOB: 1950/07/20, 64 y.o.   MRN: [id number] Received call from Maudie Mercury at [facility] [place] with patients appointment date and time:  She is to see Dr Cleon Gustin on 10/11/14 at 1:00pm.

## 2014-10-27 ENCOUNTER — Encounter (HOSPITAL_COMMUNITY): Payer: Self-pay | Admitting: Psychiatry

## 2014-10-27 ENCOUNTER — Ambulatory Visit (INDEPENDENT_AMBULATORY_CARE_PROVIDER_SITE_OTHER): Payer: 59 | Admitting: Psychiatry

## 2014-10-27 VITALS — BP 115/59 | HR 62 | Ht 63.0 in | Wt 208.2 lb

## 2014-10-27 DIAGNOSIS — F419 Anxiety disorder, unspecified: Secondary | ICD-10-CM | POA: Diagnosis not present

## 2014-10-27 DIAGNOSIS — F331 Major depressive disorder, recurrent, moderate: Secondary | ICD-10-CM

## 2014-10-27 DIAGNOSIS — F332 Major depressive disorder, recurrent severe without psychotic features: Secondary | ICD-10-CM

## 2014-10-27 MED ORDER — ALPRAZOLAM 1 MG PO TABS: 1.0000 mg | ORAL_TABLET | Freq: Four times a day (QID) | ORAL | Status: DC

## 2014-10-27 MED ORDER — ESCITALOPRAM OXALATE 20 MG PO TABS: 20.0000 mg | ORAL_TABLET | Freq: Every day | ORAL | Status: DC

## 2014-10-27 MED ORDER — TRAZODONE HCL 100 MG PO TABS: 100.0000 mg | ORAL_TABLET | Freq: Every day | ORAL | Status: DC

## 2014-10-27 NOTE — Progress Notes (Signed)
Patient ID: [name], female   DOB: Sep 22, 1950, 64 y.o.   MRN: [id number] Patient ID: [name], female   DOB: 10/21/1950, 64 y.o.   MRN: [id number] Patient ID: [name], female   DOB: 08/20/1950, 64 y.o.   MRN: [id number] Patient ID: [name], female   DOB: Jul 20, 1950, 64 y.o.   MRN: [id number] Patient ID: [name], female   DOB: [birth year], 64 y.o.   MRN: [id number] Patient ID: [name], female   DOB: [birth year], 64 y.o.   MRN: [id number] Patient ID: [name], female   DOB: 11-01-1950, 64 y.o.   MRN: [id number] Patient ID: [name], female   DOB: 14-Nov-1950, 64 y.o.   MRN: [id number] [name] 992[address] Progress Note [name] MRN: [id number] DOB: [birth year] Age: 64 y.o.  Date: 10/27/2014 Start Time: 2:30 PM End Time: 2:45 PM  Chief Complaint: Chief Complaint  Patient presents with  . Depression  . Anxiety  . Follow-up    Subjective:  "I've been doing better"  This patient is a 64 year old widowed white female. She's currently living alone . She resides in [place]. She has 2 grown children-her daughter is in prison for substance possession in her son lives in [place]. She is on disability.  The patient has a long history of depression and anxiety. She generally does pretty well but she's had some bad news lately. Her primary doctors think she has hepatitis C. She went to a GI specialist today who drew a lot of labs. Her platelets are low and her spleen is enlarged but she's not sure what's going to happen from here. She does feel like her psychiatric medications have been helpful. She sleeping pretty well.  Given all these stressors she's holding up well.  The patient returns after 2 months. Last time I started her on Lexapro and she thinks her mood is improved. She is sleeping well with the trazodone and the Xanax seems to be helping her anxiety. Her daughter is still living with her but she is not on drugs anymore. She feels like her mood is much  more stable now Allergies: Allergies  Allergen Reactions  . Levothyroxine Hives    itching   Medical History: Past Medical History  Diagnosis Date  . Depression   . Anxiety   . Splenomegaly   . Hypertension   . COPD (chronic obstructive pulmonary disease)   . Asthma   . Hepatitis C   . Migraine headache   . Arthritis   . GERD (gastroesophageal reflux disease)   . Thyroid disease   . Diverticulitis 04/2012    [facility]  Surgical History: Past Surgical History  Procedure Laterality Date  . Abdominal hysterectomy    . Joint replacement    . Colon surgery    . Knee surgery Right     x4  . Esophageal manometry N/A 04/10/2014    Procedure: ESOPHAGEAL MANOMETRY (EM);  Surgeon: Gatha Mayer, MD;  Location: [facility];  Service: Endoscopy;  Laterality: N/A;  . Ph impedance study N/A 04/10/2014    Procedure: [place] IMPEDANCE STUDY;  Surgeon: Gatha Mayer, MD;  Location: [facility];  Service: Endoscopy;  Laterality: N/A;  . Esophagogastroduodenoscopy    Family History family history includes Alcohol abuse in her father and paternal uncle; Anxiety disorder in her daughter; Bipolar disorder in her daughter; Dementia in her father and maternal grandmother; Depression in her brother and paternal uncle; Drug abuse in  her brother. There is no history of ADD / ADHD. Reviewed again with no changes noted. Diagnosis:   Axis I: Anxiety Disorder NOS and Major Depression, Recurrent severe Axis II: Deferred Axis III:  Past Medical History  Diagnosis Date  . Depression   . Anxiety   . Splenomegaly   . Hypertension   . COPD (chronic obstructive pulmonary disease)   . Asthma   . Hepatitis C   . Migraine headache   . Arthritis   . GERD (gastroesophageal reflux disease)   . Thyroid disease   . Diverticulitis 04/2012    [name]   Axis IV: other psychosocial or environmental problems Axis V: 51-60 moderate symptoms  ADL's:  Intact  Sleep: Good  Appetite:   Good  Suicidal Ideation:  Pt denies any thoughts, plans, intent of suicide Homicidal Ideation:  Pt denies any thoughts, plans, intent of homicide AEB (as evidenced by):per pt report  Psychiatric Specialty Exam: ROS ROS: Neuro: no headaches, ataxia, weakness GI: no N/V/D/cramps/constipation MS: no weakness, muscle cramps, aches, but does have joint pain from arthritis.    Blood pressure 115/59, pulse 62, height 5' 3"  (1.6 m), weight 208 lb 3.2 oz (94.439 kg).Body mass index is 36.89 kg/(m^2).  General Appearance: Casual  Eye Contact::  Good  Speech:  Clear and Coherent  Volume:  Normal  Mood: A little anxious   Affect: Brighter   Thought Process:  Coherent, Intact and Logical  Orientation:  Full (Time, Place, and Person)  Thought Content:  WDL  Suicidal Thoughts:  No  Homicidal Thoughts:  No  Memory:  Immediate;   Fair Recent;   Fair Remote;   Fair  Judgement:  Good  Insight:  Good  Psychomotor Activity:  Normal  Concentration:  Good  Recall:  Good  Akathisia:  No  Handed:  Ambidextrous  AIMS (if indicated):     Assets:  Communication Skills Desire for Improvement  Sleep:      Current Medications: Current Outpatient Prescriptions  Medication Sig Dispense Refill  . ALPRAZolam (XANAX) 1 MG tablet Take 1 tablet (1 mg total) by mouth 4 (four) times daily. 120 tablet 2  . Butalbital-APAP-Caffeine 50-300-40 MG CAPS Take by mouth as needed.    [name] Calcium (STOOL SOFTENER PO) Take by mouth daily.    Marland Kitchen escitalopram (LEXAPRO) 20 MG tablet Take 1 tablet (20 mg total) by mouth daily. 30 tablet 2  . HYDROcodone-acetaminophen (NORCO/VICODIN) 5-325 MG per tablet Take 1 tablet by mouth every 6 (six) hours as needed for moderate pain. 45 tablet 0  . loratadine (CLARITIN) 10 MG tablet Take 1 tablet (10 mg total) by mouth daily. 30 tablet 4  . meloxicam (MOBIC) 15 MG tablet Take 1 tablet (15 mg total) by mouth daily. 30 tablet 1  . metoCLOPramide (REGLAN) 10 MG tablet Take 1  tablet every morning with breakfast and at bedtime 60 tablet 2  . metoprolol succinate (TOPROL-XL) 50 MG 24 hr tablet Take 50 mg by mouth daily. Take with or immediately following a meal.    . nystatin (MYCOSTATIN) powder   2  . polyethylene glycol powder (MIRALAX) powder Take 17 g by mouth daily. 255 g 0  . ranitidine (ZANTAC) 300 MG tablet Take 300 mg by mouth at bedtime.  3  . Simethicone (GAS RELIEF PO) Take by mouth as needed.    . traZODone (DESYREL) 100 MG tablet Take 1 tablet (100 mg total) by mouth at bedtime. 30 tablet 2   No  current facility-administered medications for this visit.  Lab Results:  Results for orders placed or performed in visit on 09/14/14 (from the past 4032 hour(s))  Comprehensive metabolic panel   Collection Time: 09/14/14 11:48 AM  Result Value Ref Range   Sodium 140 135 - 145 mEq/L   Potassium 4.0 3.5 - 5.1 mEq/L   Chloride 106 96 - 112 mEq/L   CO2 27 19 - 32 mEq/L   Glucose, Bld 119 (H) 70 - 99 mg/dL   BUN 16 6 - 23 mg/dL   Creatinine, Ser 0.80 0.40 - 1.20 mg/dL   Total Bilirubin 0.4 0.2 - 1.2 mg/dL   Alkaline Phosphatase 85 39 - 117 U/L   AST 22 0 - 37 U/L   ALT 21 0 - 35 U/L   Total Protein 7.6 6.0 - 8.3 g/dL   Albumin 4.4 3.5 - 5.2 g/dL   Calcium 10.3 8.4 - 10.5 mg/dL   GFR 76.76 >60.00 mL/min  CBC with Differential/Platelet   Collection Time: 09/14/14 11:48 AM  Result Value Ref Range   WBC 5.2 4.0 - 10.5 K/uL   RBC 4.02 3.87 - 5.11 Mil/uL   Hemoglobin 13.8 12.0 - 15.0 g/dL   HCT 39.8 36.0 - 46.0 %   MCV 98.9 78.0 - 100.0 fl   MCHC 34.8 30.0 - 36.0 g/dL   RDW 14.2 11.5 - 15.5 %   Platelets 109.0 (L) 150.0 - 400.0 K/uL   Neutrophils Relative % 67.3 43.0 - 77.0 %   Lymphocytes Relative 22.7 12.0 - 46.0 %   Monocytes Relative 7.8 3.0 - 12.0 %   Eosinophils Relative 1.7 0.0 - 5.0 %   Basophils Relative 0.5 0.0 - 3.0 %   Neutro Abs 3.5 1.4 - 7.7 K/uL   Lymphs Abs 1.2 0.7 - 4.0 K/uL   Monocytes Absolute 0.4 0.1 - 1.0 K/uL   Eosinophils  Absolute 0.1 0.0 - 0.7 K/uL   Basophils Absolute 0.0 0.0 - 0.1 K/uL  Amylase   Collection Time: 09/14/14 11:48 AM  Result Value Ref Range   Amylase 21 (L) 27 - 131 U/L  Lipase   Collection Time: 09/14/14 11:48 AM  Result Value Ref Range   Lipase 18.0 11.0 - 59.0 U/L   . Physical Findings: AIMS:  , ,  ,  ,    CIWA:    COWS:    Plan: I took her vitals.  I reviewed CC, tobacco/med/surg Hx, meds effects/ side effects, problem list, therapies and responses as well as current situation/symptoms discussed options. She will continue Xanax for anxiety and trazodone for sleep. She will continue Lexapro 20 mg daily for depression. She'll return in 3 months See orders and pt instructions for more details. Meds ordered this encounter  Medications  . escitalopram (LEXAPRO) 20 MG tablet    Sig: Take 1 tablet (20 mg total) by mouth daily.    Dispense:  30 tablet    Refill:  2  . traZODone (DESYREL) 100 MG tablet    Sig: Take 1 tablet (100 mg total) by mouth at bedtime.    Dispense:  30 tablet    Refill:  2  . ALPRAZolam (XANAX) 1 MG tablet    Sig: Take 1 tablet (1 mg total) by mouth 4 (four) times daily.    Dispense:  120 tablet    Refill:  2  Medical Decision Making Problem Points:  Established problem, stable/improving (1), Review of last therapy session (1) and Review of psycho-social stressors (1) Data Points:  Review of medication regiment & side effects (2) I certify that outpatient services furnished can reasonably be expected to improve the patient's condition.  ROSS, [facility] 10/27/2014, 2:27 PM

## 2014-11-07 ENCOUNTER — Ambulatory Visit: Payer: Medicare Other | Admitting: Emergency Medicine

## 2014-12-21 ENCOUNTER — Other Ambulatory Visit: Payer: Self-pay | Admitting: Emergency Medicine

## 2014-12-25 ENCOUNTER — Other Ambulatory Visit: Payer: Self-pay | Admitting: Internal Medicine

## 2014-12-25 NOTE — Telephone Encounter (Signed)
Please advise Sir? Thank you. 

## 2014-12-27 NOTE — Telephone Encounter (Signed)
Has she been to the pain clinic in [place] re: abdominal wall pain and if so what did the tell her or do?

## 2014-12-27 NOTE — Telephone Encounter (Signed)
Left patient detailed message to call back and update [place] on what the pain clinic told her.

## 2015-01-01 NOTE — Telephone Encounter (Signed)
Patient called back, very sick with a severe cough. Going to the Dr for that later today.  She went to the pain clinic we sent her to and they said she hurt in to many places to inject so they did "nothing" but refer her to [facility], she missed her appointment because she has been so sick.  She will call them today and try to R/S that appointment.  Please advise if I may refill her Mobic?

## 2015-01-01 NOTE — Telephone Encounter (Signed)
Left message for patient to call me back. 

## 2015-01-03 NOTE — Telephone Encounter (Signed)
Ok will refill x 2 but ask her to talk to Dr. Woody Seller about refilling further (he is PCP) Send copy of this interaction to him Thanks

## 2015-01-03 NOTE — Telephone Encounter (Signed)
Spoke with patient and told her about the refill and to talk to Dr Woody Seller about further refills.  Will copy this to Dr Woody Seller as instructed.

## 2015-01-20 ENCOUNTER — Other Ambulatory Visit (HOSPITAL_COMMUNITY): Payer: Self-pay | Admitting: Psychiatry

## 2015-01-26 ENCOUNTER — Encounter (HOSPITAL_COMMUNITY): Payer: Self-pay | Admitting: Psychiatry

## 2015-01-26 ENCOUNTER — Ambulatory Visit (INDEPENDENT_AMBULATORY_CARE_PROVIDER_SITE_OTHER): Payer: 59 | Admitting: Psychiatry

## 2015-01-26 VITALS — BP 129/85 | HR 62 | Ht 63.0 in | Wt 201.6 lb

## 2015-01-26 DIAGNOSIS — F419 Anxiety disorder, unspecified: Secondary | ICD-10-CM

## 2015-01-26 DIAGNOSIS — F331 Major depressive disorder, recurrent, moderate: Secondary | ICD-10-CM | POA: Diagnosis not present

## 2015-01-26 MED ORDER — ALPRAZOLAM 1 MG PO TABS: 1.0000 mg | ORAL_TABLET | Freq: Four times a day (QID) | ORAL | Status: DC

## 2015-01-26 MED ORDER — ESCITALOPRAM OXALATE 20 MG PO TABS: 20.0000 mg | ORAL_TABLET | Freq: Every day | ORAL | Status: DC

## 2015-01-26 MED ORDER — TRAZODONE HCL 100 MG PO TABS: 100.0000 mg | ORAL_TABLET | Freq: Every day | ORAL | Status: DC

## 2015-01-26 NOTE — Progress Notes (Signed)
Patient ID: [name], female   DOB: 1951-03-17, 64 y.o.   MRN: [id number] Patient ID: [name], female   DOB: 26-Jan-1951, 64 y.o.   MRN: [id number] Patient ID: [name], female   DOB: 18-Feb-1951, 64 y.o.   MRN: [id number] Patient ID: [name], female   DOB: 1950/08/20, 64 y.o.   MRN: [id number] Patient ID: [name], female   DOB: 1950-05-02, 64 y.o.   MRN: [id number] Patient ID: [name], female   DOB: [birth year], 64 y.o.   MRN: [id number] Patient ID: [name], female   DOB: Apr 06, 1950, 64 y.o.   MRN: [id number] Patient ID: [name], female   DOB: 01-Dec-1950, 64 y.o.   MRN: [id number] Patient ID: [name], female   DOB: [birth year], 64 y.o.   MRN: [id number] [facility] [address] Progress Note [name] MRN: [id number] DOB: 06/21/1950 Age: 61 y.o.  Date: 01/26/2015 Start Time: 2:30 PM End Time: 2:45 PM  Chief Complaint: Chief Complaint  Patient presents with  . Depression  . Anxiety  . Follow-up    Subjective:  "I've been doing better"  This patient is a 64 year old widowed white female. She's currently living with her daughter in [place] She has 2 grown children and her son lives in [place]. She is on disability.  The patient has a long history of depression and anxiety. She generally does pretty well but she's had some bad news lately. Her primary doctors think she has hepatitis C. She went to a GI specialist today who drew a lot of labs. Her platelets are low and her spleen is enlarged but she's not sure what's going to happen from here. She does feel like her psychiatric medications have been helpful. She sleeping pretty well.  Given all these stressors she's holding up well.  The patient returns after 3 months. Her daughter got out of prison several months ago and the patient has had to do everything for her. Her daughter's not very educated and left school in the eighth grade. She is HIV positive and the patient has had to set up her daughter's  appointments with the infectious disease clinic in the mental health clinic etc. She states that all of this is worn her out. She does feel that the medications of helped her depression sleep and anxiety and does not want to change anything. She is gone through [facility] treatment for hepatitis C and her levels are now 0 Allergies: Allergies  Allergen Reactions  . Levothyroxine Hives    itching   Medical History: Past Medical History  Diagnosis Date  . Depression   . Anxiety   . Splenomegaly   . Hypertension   . COPD (chronic obstructive pulmonary disease) ([place])   . Asthma   . Hepatitis C   . Migraine headache   . Arthritis   . GERD (gastroesophageal reflux disease)   . Thyroid disease   . Diverticulitis 04/2012    [facility]  Surgical History: Past Surgical History  Procedure Laterality Date  . Abdominal hysterectomy    . Joint replacement    . Colon surgery    . Knee surgery Right     x4  . Esophageal manometry N/A 04/10/2014    Procedure: ESOPHAGEAL MANOMETRY (EM);  Surgeon: Gatha Mayer, MD;  Location: [facility];  Service: Endoscopy;  Laterality: N/A;  . Ph impedance study N/A 04/10/2014    Procedure: [place] IMPEDANCE STUDY;  Surgeon: Gatha Mayer,  MD;  Location: [facility];  Service: Endoscopy;  Laterality: N/A;  . Esophagogastroduodenoscopy    Family History family history includes Alcohol abuse in her father and paternal uncle; Anxiety disorder in her daughter; Bipolar disorder in her daughter; Dementia in her father and maternal grandmother; Depression in her brother and paternal uncle; Drug abuse in her brother. There is no history of ADD / ADHD. Reviewed again with no changes noted. Diagnosis:   Axis I: Anxiety Disorder NOS and Major Depression, Recurrent severe Axis II: Deferred Axis III:  Past Medical History  Diagnosis Date  . Depression   . Anxiety   . Splenomegaly   . Hypertension   . COPD (chronic obstructive pulmonary disease) ([place])   .  Asthma   . Hepatitis C   . Migraine headache   . Arthritis   . GERD (gastroesophageal reflux disease)   . Thyroid disease   . Diverticulitis 04/2012    [facility]   Axis IV: other psychosocial or environmental problems Axis V: 51-60 moderate symptoms  ADL's:  Intact  Sleep: Good  Appetite:  Good  Suicidal Ideation:  Pt denies any thoughts, plans, intent of suicide Homicidal Ideation:  Pt denies any thoughts, plans, intent of homicide AEB (as evidenced by):per pt report  Psychiatric Specialty Exam: ROS ROS: Neuro: no headaches, ataxia, weakness GI: no N/V/D/cramps/constipation MS: no weakness, muscle cramps, aches, but does have joint pain from arthritis.    Blood pressure 129/85, pulse 62, height 5' 3"  (1.6 m), weight 201 lb 9.6 oz (91.445 kg).Body mass index is 35.72 kg/(m^2).  General Appearance: Casual  Eye Contact::  Good  Speech:  Clear and Coherent  Volume:  Normal  Mood: A little anxious   Affect: Bright  Thought Process:  Coherent, Intact and Logical  Orientation:  Full (Time, Place, and Person)  Thought Content:  WDL  Suicidal Thoughts:  No  Homicidal Thoughts:  No  Memory:  Immediate;   Fair Recent;   Fair Remote;   Fair  Judgement:  Good  Insight:  Good  Psychomotor Activity:  Normal  Concentration:  Good  Recall:  Good  Akathisia:  No  Handed:  Ambidextrous  AIMS (if indicated):     Assets:  Communication Skills Desire for Improvement  Sleep:      Current Medications: Current Outpatient Prescriptions  Medication Sig Dispense Refill  . ALPRAZolam (XANAX) 1 MG tablet Take 1 tablet (1 mg total) by mouth 4 (four) times daily. 120 tablet 2  . Butalbital-APAP-Caffeine 50-300-40 MG CAPS Take by mouth as needed.    . celecoxib (CELEBREX) 100 MG capsule Take 100 mg by mouth 2 (two) times daily.  2  . Docusate Calcium (STOOL SOFTENER PO) Take by mouth as needed.     Marland Kitchen escitalopram (LEXAPRO) 20 MG tablet Take 1 tablet (20 mg total) by mouth  daily. 30 tablet 2  . HYDROcodone-acetaminophen (NORCO/VICODIN) 5-325 MG per tablet Take 1 tablet by mouth every 6 (six) hours as needed for moderate pain. 45 tablet 0  . loratadine (CLARITIN) 10 MG tablet TAKE ONE TABLET BY MOUTH ONCE DAILY 30 tablet 2  . meloxicam (MOBIC) 15 MG tablet TAKE ONE TABLET BY MOUTH DAILY 30 tablet 1  . metoCLOPramide (REGLAN) 10 MG tablet Take 1 tablet every morning with breakfast and at bedtime 60 tablet 2  . metoprolol succinate (TOPROL-XL) 50 MG 24 hr tablet Take 50 mg by mouth daily. Take with or immediately following a meal.    . nystatin (MYCOSTATIN)  powder as needed.   2  . polyethylene glycol powder (MIRALAX) powder Take 17 g by mouth daily. (Patient taking differently: Take 17 g by mouth as needed. ) 255 g 0  . predniSONE (DELTASONE) 10 MG tablet Take 10 mg by mouth 2 (two) times daily.  0  . ranitidine (ZANTAC) 300 MG tablet Take 300 mg by mouth at bedtime.  3  . Simethicone (GAS RELIEF PO) Take by mouth as needed.    . traZODone (DESYREL) 100 MG tablet Take 1 tablet (100 mg total) by mouth at bedtime. 30 tablet 2   No current facility-administered medications for this visit.  Lab Results:  Results for orders placed or performed in visit on 09/14/14 (from the past 4032 hour(s))  Comprehensive metabolic panel   Collection Time: 09/14/14 11:48 AM  Result Value Ref Range   Sodium 140 135 - 145 mEq/L   Potassium 4.0 3.5 - 5.1 mEq/L   Chloride 106 96 - 112 mEq/L   CO2 27 19 - 32 mEq/L   Glucose, Bld 119 (H) 70 - 99 mg/dL   BUN 16 6 - 23 mg/dL   Creatinine, Ser 0.80 0.40 - 1.20 mg/dL   Total Bilirubin 0.4 0.2 - 1.2 mg/dL   Alkaline Phosphatase 85 39 - 117 U/L   AST 22 0 - 37 U/L   ALT 21 0 - 35 U/L   Total Protein 7.6 6.0 - 8.3 g/dL   Albumin 4.4 3.5 - 5.2 g/dL   Calcium 10.3 8.4 - 10.5 mg/dL   GFR 76.76 >60.00 mL/min  CBC with Differential/Platelet   Collection Time: 09/14/14 11:48 AM  Result Value Ref Range   WBC 5.2 4.0 - 10.5 K/uL   RBC 4.02  3.87 - 5.11 Mil/uL   Hemoglobin 13.8 12.0 - 15.0 g/dL   HCT 39.8 36.0 - 46.0 %   MCV 98.9 78.0 - 100.0 fl   MCHC 34.8 30.0 - 36.0 g/dL   RDW 14.2 11.5 - 15.5 %   Platelets 109.0 (L) 150.0 - 400.0 K/uL   Neutrophils Relative % 67.3 43.0 - 77.0 %   Lymphocytes Relative 22.7 12.0 - 46.0 %   Monocytes Relative 7.8 3.0 - 12.0 %   Eosinophils Relative 1.7 0.0 - 5.0 %   Basophils Relative 0.5 0.0 - 3.0 %   Neutro Abs 3.5 1.4 - 7.7 K/uL   Lymphs Abs 1.2 0.7 - 4.0 K/uL   Monocytes Absolute 0.4 0.1 - 1.0 K/uL   Eosinophils Absolute 0.1 0.0 - 0.7 K/uL   Basophils Absolute 0.0 0.0 - 0.1 K/uL  Amylase   Collection Time: 09/14/14 11:48 AM  Result Value Ref Range   Amylase 21 (L) 27 - 131 U/L  Lipase   Collection Time: 09/14/14 11:48 AM  Result Value Ref Range   Lipase 18.0 11.0 - 59.0 U/L   . Physical Findings: AIMS:  , ,  ,  ,    CIWA:    COWS:    Plan: I took her vitals.  I reviewed CC, tobacco/med/surg Hx, meds effects/ side effects, problem list, therapies and responses as well as current situation/symptoms discussed options. She will continue Xanax for anxiety and trazodone for sleep. She will continue Lexapro 20 mg daily for depression. She'll return in 3 months See orders and pt instructions for more details. Meds ordered this encounter  Medications  . predniSONE (DELTASONE) 10 MG tablet    Sig: Take 10 mg by mouth 2 (two) times daily.    Refill:  0  . celecoxib (CELEBREX) 100 MG capsule    Sig: Take 100 mg by mouth 2 (two) times daily.    Refill:  2  . escitalopram (LEXAPRO) 20 MG tablet    Sig: Take 1 tablet (20 mg total) by mouth daily.    Dispense:  30 tablet    Refill:  2  . traZODone (DESYREL) 100 MG tablet    Sig: Take 1 tablet (100 mg total) by mouth at bedtime.    Dispense:  30 tablet    Refill:  2  . ALPRAZolam (XANAX) 1 MG tablet    Sig: Take 1 tablet (1 mg total) by mouth 4 (four) times daily.    Dispense:  120 tablet    Refill:  2  Medical Decision  Making Problem Points:  Established problem, stable/improving (1), Review of last therapy session (1) and Review of psycho-social stressors (1) Data Points:  Review of medication regiment & side effects (2) I certify that outpatient services furnished can reasonably be expected to improve the patient's condition.  Georgeana Oertel, [facility] 01/26/2015, 1:54 PM

## 2015-04-26 ENCOUNTER — Encounter (HOSPITAL_COMMUNITY): Payer: Self-pay | Admitting: Psychiatry

## 2015-04-26 ENCOUNTER — Ambulatory Visit (INDEPENDENT_AMBULATORY_CARE_PROVIDER_SITE_OTHER): Payer: 59 | Admitting: Psychiatry

## 2015-04-26 VITALS — BP 145/76 | HR 52 | Ht 63.0 in | Wt 198.8 lb

## 2015-04-26 DIAGNOSIS — F331 Major depressive disorder, recurrent, moderate: Secondary | ICD-10-CM | POA: Diagnosis not present

## 2015-04-26 DIAGNOSIS — F419 Anxiety disorder, unspecified: Secondary | ICD-10-CM

## 2015-04-26 MED ORDER — ESCITALOPRAM OXALATE 20 MG PO TABS: 20.0000 mg | ORAL_TABLET | Freq: Every day | ORAL | Status: DC

## 2015-04-26 MED ORDER — ALPRAZOLAM 1 MG PO TABS: 1.0000 mg | ORAL_TABLET | Freq: Four times a day (QID) | ORAL | Status: DC

## 2015-04-26 MED ORDER — TEMAZEPAM 15 MG PO CAPS: 15.0000 mg | ORAL_CAPSULE | Freq: Every day | ORAL | Status: DC

## 2015-04-26 NOTE — Progress Notes (Signed)
Patient ID: [name], female   DOB: 02-25-1951, 65 y.o.   MRN: [id number] Patient ID: [name], female   DOB: 1950/11/02, 65 y.o.   MRN: [id number] Patient ID: [name], female   DOB: 22-Sep-1950, 65 y.o.   MRN: [id number] Patient ID: [name], female   DOB: [birth year], 65 y.o.   MRN: [id number] Patient ID: [name], female   DOB: 06/01/1950, 65 y.o.   MRN: [id number] Patient ID: SHAWNETTE [name], female   DOB: 1950/04/19, 65 y.o.   MRN: [id number] Patient ID: [name], female   DOB: [birth year], 65 y.o.   MRN: [id number] Patient ID: [name], female   DOB: May 25, 1950, 65 y.o.   MRN: [id number] Patient ID: [name], female   DOB: 12-08-1950, 65 y.o.   MRN: [id number] Patient ID: [name], female   DOB: Jul 16, 1950, 65 y.o.   MRN: [id number] [facility] [address] Progress Note ROLANDA CAMPA MRN: [id number] DOB: 1950/11/03 Age: 65 y.o.  Date: 04/26/2015 Start Time: 2:30 PM End Time: 2:45 PM  Chief Complaint: Chief Complaint  Patient presents with  . Depression  . Anxiety  . Follow-up    Subjective:  "I've been really stressed."  This patient is a 65 year old widowed white female. She's currently living with her daughter in [place] She has 2 grown children and her son lives in [place]. She is on disability.  The patient has a long history of depression and anxiety. She generally does pretty well but she's had some bad news lately. Her primary doctors think she has hepatitis C. She went to a GI specialist today who drew a lot of labs. Her platelets are low and her spleen is enlarged but she's not sure what's going to happen from here. She does feel like her psychiatric medications have been helpful. She sleeping pretty well.  Given all these stressors she's holding up well.  The patient returns after 3 months. Her daughter moved back in several months ago after getting out of prison. Apparently her daughter's getting into trouble again, stealing checks other peoples  mailboxes and she has new legal charges. The daughter has a history of crack cocaine abuse and the patient is concerned that she might be ring back into this again. She wants the daughter to move out that she has nowhere to go and she is HIV positive. The patient states she can't sleep lately with the trazodone and I suggested we switch to Restoril. Her daughter is getting help at [facility] and families and perhaps they can help her get more resources Allergies: Allergies  Allergen Reactions  . Levothyroxine Hives    itching   Medical History: Past Medical History  Diagnosis Date  . Depression   . Anxiety   . Splenomegaly   . Hypertension   . COPD (chronic obstructive pulmonary disease) ([place])   . Asthma   . Hepatitis C   . Migraine headache   . Arthritis   . GERD (gastroesophageal reflux disease)   . Thyroid disease   . Diverticulitis 04/2012    [facility]  Surgical History: Past Surgical History  Procedure Laterality Date  . Abdominal hysterectomy    . Joint replacement    . Colon surgery    . Knee surgery Right     x4  . Esophageal manometry N/A 04/10/2014    Procedure: ESOPHAGEAL MANOMETRY (EM);  Surgeon: Gatha Mayer, MD;  Location: [facility];  Service: Endoscopy;  Laterality: N/A;  . Ph impedance study N/A 04/10/2014    Procedure: [place] IMPEDANCE STUDY;  Surgeon: Gatha Mayer, MD;  Location: [facility];  Service: Endoscopy;  Laterality: N/A;  . Esophagogastroduodenoscopy    Family History family history includes Alcohol abuse in her father and paternal uncle; Anxiety disorder in her daughter; Bipolar disorder in her daughter; Dementia in her father and maternal grandmother; Depression in her brother and paternal uncle; Drug abuse in her brother. There is no history of ADD / ADHD. Reviewed again with no changes noted. Diagnosis:   Axis I: Anxiety Disorder NOS and Major Depression, Recurrent severe Axis II: Deferred Axis III:  Past Medical History  Diagnosis  Date  . Depression   . Anxiety   . Splenomegaly   . Hypertension   . COPD (chronic obstructive pulmonary disease) ([place])   . Asthma   . Hepatitis C   . Migraine headache   . Arthritis   . GERD (gastroesophageal reflux disease)   . Thyroid disease   . Diverticulitis 04/2012    [facility]   Axis IV: other psychosocial or environmental problems Axis V: 51-60 moderate symptoms  ADL's:  Intact  Sleep: Good  Appetite:  Good  Suicidal Ideation:  Pt denies any thoughts, plans, intent of suicide Homicidal Ideation:  Pt denies any thoughts, plans, intent of homicide AEB (as evidenced by):per pt report  Psychiatric Specialty Exam: ROS ROS: Neuro: no headaches, ataxia, weakness GI: no N/V/D/cramps/constipation MS: no weakness, muscle cramps, aches, but does have joint pain from arthritis.    Blood pressure 145/76, pulse 52, height 5' 3"  (1.6 m), weight 198 lb 12.8 oz (90.175 kg), SpO2 96 %.Body mass index is 35.22 kg/(m^2).  General Appearance: Casual  Eye Contact::  Good  Speech:  Clear and Coherent  Volume:  Normal  Mood: Anxious   Affect: Upset and tearful   Thought Process:  Coherent, Intact and Logical  Orientation:  Full (Time, Place, and Person)  Thought Content:  WDL  Suicidal Thoughts:  No  Homicidal Thoughts:  No  Memory:  Immediate;   Fair Recent;   Fair Remote;   Fair  Judgement:  Good  Insight:  Good  Psychomotor Activity:  Normal  Concentration:  Good  Recall:  Good  Akathisia:  No  Handed:  Ambidextrous  AIMS (if indicated):     Assets:  Communication Skills Desire for Improvement  Sleep:      Current Medications: Current Outpatient Prescriptions  Medication Sig Dispense Refill  . ALPRAZolam (XANAX) 1 MG tablet Take 1 tablet (1 mg total) by mouth 4 (four) times daily. 120 tablet 2  . Butalbital-APAP-Caffeine 50-300-40 MG CAPS Take by mouth as needed.    . celecoxib (CELEBREX) 100 MG capsule Take 100 mg by mouth 2 (two) times daily.  2   . Docusate Calcium (STOOL SOFTENER PO) Take by mouth as needed.     Marland Kitchen escitalopram (LEXAPRO) 20 MG tablet Take 1 tablet (20 mg total) by mouth daily. 30 tablet 2  . loratadine (CLARITIN) 10 MG tablet TAKE ONE TABLET BY MOUTH ONCE DAILY 30 tablet 2  . meloxicam (MOBIC) 15 MG tablet TAKE ONE TABLET BY MOUTH DAILY 30 tablet 1  . metoCLOPramide (REGLAN) 10 MG tablet Take 1 tablet every morning with breakfast and at bedtime 60 tablet 2  . metoprolol succinate (TOPROL-XL) 50 MG 24 hr tablet Take 50 mg by mouth daily. Take with or immediately following a meal.    . nystatin (MYCOSTATIN)  powder as needed.   2  . polyethylene glycol powder (MIRALAX) powder Take 17 g by mouth daily. (Patient taking differently: Take 17 g by mouth as needed. ) 255 g 0  . predniSONE (DELTASONE) 10 MG tablet Take 10 mg by mouth 2 (two) times daily.  0  . ranitidine (ZANTAC) 300 MG tablet Take 300 mg by mouth at bedtime.  3  . Simethicone (GAS RELIEF PO) Take by mouth as needed.    . traZODone (DESYREL) 100 MG tablet Take 1 tablet (100 mg total) by mouth at bedtime. 30 tablet 2  . temazepam (RESTORIL) 15 MG capsule Take 1 capsule (15 mg total) by mouth at bedtime. 30 capsule 2   No current facility-administered medications for this visit.  Lab Results:  No results found for this or any previous visit (from the past 4032 hour(s)). Marland Kitchen Physical Findings: AIMS:  , ,  ,  ,    CIWA:    COWS:    Plan: I took her vitals.  I reviewed CC, tobacco/med/surg Hx, meds effects/ side effects, problem list, therapies and responses as well as current situation/symptoms discussed options. She will continue Xanax for anxiety . She will continue Lexapro 20 mg daily for depression. She will discontinue trazodone and start Restoril 15 mg at bedtime for sleep She'll return in 6 weeks See orders and pt instructions for more details. Meds ordered this encounter  Medications  . escitalopram (LEXAPRO) 20 MG tablet    Sig: Take 1 tablet (20 mg  total) by mouth daily.    Dispense:  30 tablet    Refill:  2  . ALPRAZolam (XANAX) 1 MG tablet    Sig: Take 1 tablet (1 mg total) by mouth 4 (four) times daily.    Dispense:  120 tablet    Refill:  2  . temazepam (RESTORIL) 15 MG capsule    Sig: Take 1 capsule (15 mg total) by mouth at bedtime.    Dispense:  30 capsule    Refill:  2  Medical Decision Making Problem Points:  Established problem, stable/improving (1), Review of last therapy session (1) and Review of psycho-social stressors (1) Data Points:  Review of medication regiment & side effects (2) I certify that outpatient services furnished can reasonably be expected to improve the patient's condition.  Louella Medaglia, [facility] 04/26/2015, 2:18 PM

## 2015-07-30 ENCOUNTER — Telehealth (HOSPITAL_COMMUNITY): Payer: Self-pay | Admitting: *Deleted

## 2015-07-30 ENCOUNTER — Ambulatory Visit (HOSPITAL_COMMUNITY): Payer: Self-pay | Admitting: Psychiatry

## 2015-07-30 NOTE — Telephone Encounter (Signed)
You may call in one month supply of all meds

## 2015-07-30 NOTE — Telephone Encounter (Signed)
patient did not show for appointment this afternoon, she said she was sick, but she need refill of all three of her medicined.

## 2015-07-30 NOTE — Telephone Encounter (Signed)
Message sent to provider 

## 2015-07-30 NOTE — Telephone Encounter (Signed)
Pt called stating she need refills for all of her medications. Pt was scheduled to f/u today 07-30-15 and did not show. Pt Xanax, Lexapro and Temazepam were all filled 04-26-15 with 2 refills and her Trazodone was filled on 01-26-2015. Pt did reschedule f/u and it is scheduled for 08-29-15. Pt only no show other then today was 09-15-2014. Pt number is [phone].

## 2015-07-31 ENCOUNTER — Telehealth (HOSPITAL_COMMUNITY): Payer: Self-pay | Admitting: *Deleted

## 2015-07-31 MED ORDER — ALPRAZOLAM 1 MG PO TABS: 1.0000 mg | ORAL_TABLET | Freq: Four times a day (QID) | ORAL | Status: DC

## 2015-07-31 NOTE — Telephone Encounter (Signed)
completed

## 2015-07-31 NOTE — Telephone Encounter (Signed)
Per Dr. Harrington Challenger to call in 1 month worth of all of pt medications to pharmacy. Called pharmacy and spoke with [facility] and she stated they will get medication ready for pt.

## 2015-07-31 NOTE — Telephone Encounter (Signed)
[name] would like you to call her at your convenience.

## 2015-07-31 NOTE — Telephone Encounter (Signed)
Called in pt [name] to pharmacy due to [facility] stating Pt only need refills for this medication. Per Beth the pharmacist, they get pt medications ready

## 2015-07-31 NOTE — Telephone Encounter (Signed)
lmtcb on pt voicemail. Number provided

## 2015-08-29 ENCOUNTER — Ambulatory Visit (HOSPITAL_COMMUNITY): Payer: Medicaid Other | Admitting: Psychiatry

## 2015-08-30 ENCOUNTER — Telehealth (HOSPITAL_COMMUNITY): Payer: Self-pay | Admitting: *Deleted

## 2015-08-30 ENCOUNTER — Other Ambulatory Visit (HOSPITAL_COMMUNITY): Payer: Self-pay | Admitting: Psychiatry

## 2015-08-30 NOTE — Telephone Encounter (Signed)
Patient NO Show on 07/30/15 and 08/29/15.   She said she need her medications.

## 2015-08-30 NOTE — Telephone Encounter (Signed)
Per pt, she need refills for her Trazodone, Temazepam, Lexapro, Xanax and Celebrax. Pt no showed for her appt on 09-15-2014, 07-30-2015 and 08-29-2015. Pt rescheduled again f/u appt for 09-12-2015. Per pt to inform provider that she is out of her medication. Pt number is [phone].

## 2015-08-30 NOTE — Telephone Encounter (Signed)
Pt f/u appt is resch for 09-12-2015 and message was resent to provider

## 2015-08-30 NOTE — Telephone Encounter (Signed)
You may call in 30 days of each, no refills. This is her last chance

## 2015-08-30 NOTE — Telephone Encounter (Signed)
No refills without appt

## 2015-08-31 ENCOUNTER — Telehealth (HOSPITAL_COMMUNITY): Payer: Self-pay | Admitting: *Deleted

## 2015-08-31 MED ORDER — ALPRAZOLAM 1 MG PO TABS: 1.0000 mg | ORAL_TABLET | Freq: Four times a day (QID) | ORAL | Status: DC

## 2015-08-31 MED ORDER — ESCITALOPRAM OXALATE 20 MG PO TABS: 20.0000 mg | ORAL_TABLET | Freq: Every day | ORAL | Status: DC

## 2015-08-31 MED ORDER — TEMAZEPAM 15 MG PO CAPS: 15.0000 mg | ORAL_CAPSULE | Freq: Every day | ORAL | Status: DC

## 2015-08-31 NOTE — Telephone Encounter (Signed)
Per provider to call in refills for pt. Called pt pharmacy and spoke with Caryl Pina.

## 2015-08-31 NOTE — Telephone Encounter (Signed)
Called in pt refills and spoke with Caryl Pina

## 2015-09-12 ENCOUNTER — Ambulatory Visit (INDEPENDENT_AMBULATORY_CARE_PROVIDER_SITE_OTHER): Payer: 59 | Admitting: Psychiatry

## 2015-09-12 ENCOUNTER — Encounter (HOSPITAL_COMMUNITY): Payer: Self-pay | Admitting: Psychiatry

## 2015-09-12 VITALS — BP 148/97 | HR 85 | Ht 63.0 in | Wt 180.6 lb

## 2015-09-12 DIAGNOSIS — F331 Major depressive disorder, recurrent, moderate: Secondary | ICD-10-CM | POA: Diagnosis not present

## 2015-09-12 MED ORDER — ESCITALOPRAM OXALATE 20 MG PO TABS: 20.0000 mg | ORAL_TABLET | Freq: Every day | ORAL | Status: DC

## 2015-09-12 MED ORDER — ALPRAZOLAM 1 MG PO TABS: 1.0000 mg | ORAL_TABLET | Freq: Four times a day (QID) | ORAL | Status: DC

## 2015-09-12 MED ORDER — TEMAZEPAM 15 MG PO CAPS: 15.0000 mg | ORAL_CAPSULE | Freq: Every day | ORAL | Status: DC

## 2015-09-12 NOTE — Progress Notes (Signed)
Patient ID: [name], female   DOB: 1950-10-11, 65 y.o.   MRN: [id number] Patient ID: [name], female   DOB: 1950-04-13, 65 y.o.   MRN: [id number] Patient ID: [name], female   DOB: December 05, 1950, 65 y.o.   MRN: [id number] Patient ID: [name], female   DOB: 09-Apr-1950, 65 y.o.   MRN: [id number] Patient ID: [name], female   DOB: Feb 01, 1951, 65 y.o.   MRN: [id number] Patient ID: [name], female   DOB: Aug 26, 1950, 65 y.o.   MRN: [id number] Patient ID: [name], female   DOB: [birth year], 65 y.o.   MRN: [id number] Patient ID: [name], female   DOB: [birth year], 65 y.o.   MRN: [id number] Patient ID: [name], female   DOB: 08/23/1950, 65 y.o.   MRN: [id number] Patient ID: [name], female   DOB: 04-25-1950, 65 y.o.   MRN: [id number] Patient ID: [name], female   DOB: Oct 12, 1950, 65 y.o.   MRN: [id number] [facility] Behavior[facility] [address] Progress Note [name] MRN: [id number] DOB: 1950/05/19 Age: 65 y.o.  Date: 09/12/2015 Start Time: 2:30 PM End Time: 2:45 PM  Chief Complaint: Chief Complaint  Patient presents with  . Depression  . Anxiety  . Follow-up    Subjective:  "I've been sick"  This patient is a 65 year old widowed white female. She's currently living with her daughter in [place] She has 2 grown children and her son lives in [place]. She is on disability.  The patient has a long history of depression and anxiety. She generally does pretty well but she's had some bad news lately. Her primary doctors think she has hepatitis C. She went to a GI specialist today who drew a lot of labs. Her platelets are low and her spleen is enlarged but she's not sure what's going to happen from here. She does feel like her psychiatric medications have been helpful. She sleeping pretty well.  Given all these stressors she's holding up well.  The patient returns after 3 months. She has 2 abdominal hernias that are giving her a lot of trouble. She is probably going to  have surgery fairly soon. She is not able to eat much and has lost about 30 pounds. She does think her psychiatric medicines are helpful although her sleep is somewhat interrupted. She doesn't want to go on more sleeping medication. The Restoril helps her get to sleep but sometimes she wakes up through the night. She is anxious and worried that this mostly has to do with the upcoming surgeries. Allergies: Allergies  Allergen Reactions  . Levothyroxine Hives    itching   Medical History: Past Medical History  Diagnosis Date  . Depression   . Anxiety   . Splenomegaly   . Hypertension   . COPD (chronic obstructive pulmonary disease) ([place])   . Asthma   . Hepatitis C   . Migraine headache   . Arthritis   . GERD (gastroesophageal reflux disease)   . Thyroid disease   . Diverticulitis 04/2012    [facility]  Surgical History: Past Surgical History  Procedure Laterality Date  . Abdominal hysterectomy    . Joint replacement    . Colon surgery    . Knee surgery Right     x4  . Esophageal manometry N/A 04/10/2014    Procedure: ESOPHAGEAL MANOMETRY (EM);  Surgeon: Gatha Mayer, MD;  Location: [facility];  Service: Endoscopy;  Laterality: N/A;  .  Ph impedance study N/A 04/10/2014    Procedure: [place] IMPEDANCE STUDY;  Surgeon: Gatha Mayer, MD;  Location: [facility];  Service: Endoscopy;  Laterality: N/A;  . Esophagogastroduodenoscopy    Family History family history includes Alcohol abuse in her father and paternal uncle; Anxiety disorder in her daughter; Bipolar disorder in her daughter; Dementia in her father and maternal grandmother; Depression in her brother and paternal uncle; Drug abuse in her brother. There is no history of ADD / ADHD. Reviewed again with no changes noted. Diagnosis:   Axis I: Anxiety Disorder NOS and Major Depression, Recurrent severe Axis II: Deferred Axis III:  Past Medical History  Diagnosis Date  . Depression   . Anxiety   . Splenomegaly    . Hypertension   . COPD (chronic obstructive pulmonary disease) ([place])   . Asthma   . Hepatitis C   . Migraine headache   . Arthritis   . GERD (gastroesophageal reflux disease)   . Thyroid disease   . Diverticulitis 04/2012    [facility]   Axis IV: other psychosocial or environmental problems Axis V: 51-60 moderate symptoms  ADL's:  Intact  Sleep: Good  Appetite:  Good  Suicidal Ideation:  Pt denies any thoughts, plans, intent of suicide Homicidal Ideation:  Pt denies any thoughts, plans, intent of homicide AEB (as evidenced by):per pt report  Psychiatric Specialty Exam: ROS ROS: Neuro: no headaches, ataxia, weakness GI: no N/V/D/cramps/constipation MS: no weakness, muscle cramps, aches, but does have joint pain from arthritis.    Blood pressure 148/97, pulse 85, height 5' 3"  (1.6 m), weight 180 lb 9.6 oz (81.92 kg), SpO2 98 %.Body mass index is 32 kg/(m^2).  General Appearance: Casual  Eye Contact::  Good  Speech:  Clear and Coherent  Volume:  Normal  Mood: Anxious   Affect: Congruent   Thought Process:  Coherent, Intact and Logical  Orientation:  Full (Time, Place, and Person)  Thought Content:  WDL  Suicidal Thoughts:  No  Homicidal Thoughts:  No  Memory:  Immediate;   Fair Recent;   Fair Remote;   Fair  Judgement:  Good  Insight:  Good  Psychomotor Activity:  Normal  Concentration:  Good  Recall:  Good  Akathisia:  No  Handed:  Ambidextrous  AIMS (if indicated):     Assets:  Communication Skills Desire for Improvement  Sleep:      Current Medications: Current Outpatient Prescriptions  Medication Sig Dispense Refill  . ALPRAZolam (XANAX) 1 MG tablet Take 1 tablet (1 mg total) by mouth 4 (four) times daily. 120 tablet 2  . Butalbital-APAP-Caffeine 50-300-40 MG CAPS Take by mouth as needed.    . celecoxib (CELEBREX) 100 MG capsule Take 100 mg by mouth 2 (two) times daily.  2  . Docusate Calcium (STOOL SOFTENER PO) Take by mouth as needed.      Marland Kitchen escitalopram (LEXAPRO) 20 MG tablet Take 1 tablet (20 mg total) by mouth daily. 30 tablet 2  . loratadine (CLARITIN) 10 MG tablet TAKE ONE TABLET BY MOUTH ONCE DAILY 30 tablet 2  . meloxicam (MOBIC) 15 MG tablet TAKE ONE TABLET BY MOUTH DAILY 30 tablet 1  . metoCLOPramide (REGLAN) 10 MG tablet Take 1 tablet every morning with breakfast and at bedtime 60 tablet 2  . metoprolol succinate (TOPROL-XL) 50 MG 24 hr tablet Take 50 mg by mouth daily. Take with or immediately following a meal.    . nystatin (MYCOSTATIN) powder as needed.   2  .  polyethylene glycol powder (MIRALAX) powder Take 17 g by mouth daily. (Patient taking differently: Take 17 g by mouth as needed. ) 255 g 0  . predniSONE (DELTASONE) 10 MG tablet Take 10 mg by mouth 2 (two) times daily.  0  . ranitidine (ZANTAC) 300 MG tablet Take 300 mg by mouth at bedtime.  3  . Simethicone (GAS RELIEF PO) Take by mouth as needed.    . temazepam (RESTORIL) 15 MG capsule Take 1 capsule (15 mg total) by mouth at bedtime. 30 capsule 2   No current facility-administered medications for this visit.  Lab Results:  No results found for this or any previous visit (from the past 4032 hour(s)). Marland Kitchen Physical Findings: AIMS:  , ,  ,  ,    CIWA:    COWS:    Plan: I took her vitals.  I reviewed CC, tobacco/med/surg Hx, meds effects/ side effects, problem list, therapies and responses as well as current situation/symptoms discussed options. She will continue Xanax for anxiety . She will continue Lexapro 20 mg daily for depression. She will Continue Restoril 15 mg at bedtime for sleep She'll return in 3 months See orders and pt instructions for more details. Meds ordered this encounter  Medications  . escitalopram (LEXAPRO) 20 MG tablet    Sig: Take 1 tablet (20 mg total) by mouth daily.    Dispense:  30 tablet    Refill:  2  . ALPRAZolam (XANAX) 1 MG tablet    Sig: Take 1 tablet (1 mg total) by mouth 4 (four) times daily.    Dispense:  120  tablet    Refill:  2  . temazepam (RESTORIL) 15 MG capsule    Sig: Take 1 capsule (15 mg total) by mouth at bedtime.    Dispense:  30 capsule    Refill:  2  Medical Decision Making Problem Points:  Established problem, stable/improving (1), Review of last therapy session (1) and Review of psycho-social stressors (1) Data Points:  Review of medication regiment & side effects (2) I certify that outpatient services furnished can reasonably be expected to improve the patient's condition.  Janecia Palau, [facility] 09/12/2015, 8:25 AM

## 2015-10-01 ENCOUNTER — Other Ambulatory Visit: Payer: Self-pay | Admitting: General Surgery

## 2015-10-01 DIAGNOSIS — K432 Incisional hernia without obstruction or gangrene: Secondary | ICD-10-CM

## 2015-10-11 ENCOUNTER — Other Ambulatory Visit: Payer: Self-pay

## 2015-10-12 ENCOUNTER — Ambulatory Visit: Payer: Self-pay | Admitting: General Surgery

## 2015-10-17 ENCOUNTER — Encounter (HOSPITAL_COMMUNITY): Payer: Self-pay

## 2015-10-17 NOTE — Pre-Procedure Instructions (Signed)
[name]  10/17/2015      [facility] - [address], [place], [place] - [address] [address] Phone: [phone] Fax: [phone]    Your procedure is scheduled on Wednesday, October 24, 2015  Report to [facility] Admitting at 11:15 A.M.   Call this number if you have problems the morning of surgery:  [phone]   Remember:  Do not eat food or drink liquids after midnight Tuesday, October 23, 2015   Take these medicines the morning of surgery with A SIP OF WATER : Albuterol Inhaler if needed (please bring with you), Albuterol Nebulizer (if needed), Alprazolam (Xanax) if needed, Metoprolol Succinate (Toprol-XL).  Stop taking Aspirin, Vitamins, Fish Oil, and herbal medications. Do not take any NSAIDs ie: Ibuprofen, Advil, Naproxen, BC and Goody Powder or any medication containing Aspirin such as Meloxicam ( Mobic) and Celecoxib ( Celebrex); stop now.   Do not wear jewelry, make-up or nail polish.  Do not wear lotions, powders, or perfumes.  You may not wear deoderant.  Do not shave 48 hours prior to surgery.    Do not bring valuables to the hospital.  [facility] is not responsible for any belongings or valuables.  Contacts, dentures or bridgework may not be worn into surgery.  Leave your suitcase in the car.  After surgery it may be brought to your room.  For patients admitted to the hospital, discharge time will be determined by your treatment team.  Patients discharged the day of surgery will not be allowed to drive home.    Special instructions: [place] - Preparing for Surgery  Before surgery, you can play an important role.  Because skin is not sterile, your skin needs to be as free of germs as possible.  You can reduce the number of germs on you skin by washing with CHG (chlorahexidine gluconate) soap before surgery.  CHG is an antiseptic cleaner which kills germs and bonds with the skin to continue killing germs even after  washing.  Please DO NOT use if you have an allergy to CHG or antibacterial soaps.  If your skin becomes reddened/irritated stop using the CHG and inform your nurse when you arrive at Short Stay.  Do not shave (including legs and underarms) for at least 48 hours prior to the first CHG shower.  You may shave your face.  Please follow these instructions carefully:   1.  Shower with CHG Soap the night before surgery and the morning of Surgery.  2.  If you choose to wash your hair, wash your hair first as usual with your normal shampoo.  3.  After you shampoo, rinse your hair and body thoroughly to remove the Shampoo.  4.  Use CHG as you would any other liquid soap.  You can apply chg directly  to the skin and wash gently with scrungie or a clean washcloth.  5.  Apply the CHG Soap to your body ONLY FROM THE NECK DOWN.  Do not use on open wounds or open sores.  Avoid contact with your eyes, ears, mouth and genitals (private parts).  Wash genitals (private parts) with your normal soap.  6.  Wash thoroughly, paying special attention to the area where your surgery will be performed.  7.  Thoroughly rinse your body with warm water from the neck down.  8.  DO NOT shower/wash with your normal soap after using and rinsing off the CHG Soap.  9.  Pat yourself dry with a clean towel.            10.  Wear clean pajamas.            11.  Place clean sheets on your bed the night of your first shower and do not sleep with pets.  Day of Surgery  Do not apply any lotions/deodorants the morning of surgery.  Please wear clean clothes to the hospital/surgery center.  Please read over the following fact sheets that you were given.

## 2015-10-18 ENCOUNTER — Encounter (HOSPITAL_COMMUNITY)
Admission: RE | Admit: 2015-10-18 | Discharge: 2015-10-18 | Disposition: A | Discharge status: Home / Self Care | Payer: Medicare Other | Source: Ambulatory Visit | Attending: General Surgery | Admitting: General Surgery

## 2015-10-18 ENCOUNTER — Ambulatory Visit (HOSPITAL_COMMUNITY)
Admission: RE | Admit: 2015-10-18 | Discharge: 2015-10-18 | Disposition: A | Discharge status: Home / Self Care | Payer: Medicare Other | Source: Ambulatory Visit | Attending: Anesthesiology | Admitting: Anesthesiology

## 2015-10-18 ENCOUNTER — Encounter (HOSPITAL_COMMUNITY): Payer: Self-pay

## 2015-10-18 DIAGNOSIS — Z01818 Encounter for other preprocedural examination: Secondary | ICD-10-CM | POA: Insufficient documentation

## 2015-10-18 DIAGNOSIS — F419 Anxiety disorder, unspecified: Secondary | ICD-10-CM | POA: Insufficient documentation

## 2015-10-18 DIAGNOSIS — J449 Chronic obstructive pulmonary disease, unspecified: Secondary | ICD-10-CM | POA: Insufficient documentation

## 2015-10-18 DIAGNOSIS — Z7982 Long term (current) use of aspirin: Secondary | ICD-10-CM | POA: Diagnosis not present

## 2015-10-18 DIAGNOSIS — Z79899 Other long term (current) drug therapy: Secondary | ICD-10-CM | POA: Diagnosis not present

## 2015-10-18 DIAGNOSIS — Z01810 Encounter for preprocedural cardiovascular examination: Secondary | ICD-10-CM | POA: Diagnosis not present

## 2015-10-18 DIAGNOSIS — G4700 Insomnia, unspecified: Secondary | ICD-10-CM | POA: Insufficient documentation

## 2015-10-18 DIAGNOSIS — R0600 Dyspnea, unspecified: Secondary | ICD-10-CM | POA: Insufficient documentation

## 2015-10-18 DIAGNOSIS — I10 Essential (primary) hypertension: Secondary | ICD-10-CM | POA: Diagnosis not present

## 2015-10-18 DIAGNOSIS — F329 Major depressive disorder, single episode, unspecified: Secondary | ICD-10-CM | POA: Diagnosis not present

## 2015-10-18 DIAGNOSIS — K219 Gastro-esophageal reflux disease without esophagitis: Secondary | ICD-10-CM | POA: Insufficient documentation

## 2015-10-18 HISTORY — DX: Paresthesia of skin: R20.2

## 2015-10-18 HISTORY — DX: Presence of spectacles and contact lenses: Z97.3

## 2015-10-18 HISTORY — DX: Personal history of pneumonia (recurrent): Z87.01

## 2015-10-18 HISTORY — DX: Insomnia, unspecified: G47.00

## 2015-10-18 HISTORY — DX: Reserved for inherently not codable concepts without codable children: IMO0001

## 2015-10-18 HISTORY — DX: Anesthesia of skin: R20.0

## 2015-10-18 LAB — COMPREHENSIVE METABOLIC PANEL
ALBUMIN: 4.3 g/dL (ref 3.5–5.0)
ALT: 18 U/L (ref 14–54)
ANION GAP: 8 (ref 5–15)
AST: 19 U/L (ref 15–41)
Alkaline Phosphatase: 92 U/L (ref 38–126)
BUN: 15 mg/dL (ref 6–20)
CHLORIDE: 105 mmol/L (ref 101–111)
CO2: 27 mmol/L (ref 22–32)
CREATININE: 0.72 mg/dL (ref 0.44–1.00)
Calcium: 10.4 mg/dL — ABNORMAL HIGH (ref 8.9–10.3)
GFR calc non Af Amer: >60 mL/min (ref >60)
GLUCOSE: 72 mg/dL (ref 65–99)
Potassium: 4.2 mmol/L (ref 3.5–5.1)
SODIUM: 140 mmol/L (ref 135–145)
Total Bilirubin: 0.2 mg/dL — ABNORMAL LOW (ref 0.3–1.2)
Total Protein: 6.9 g/dL (ref 6.5–8.1)

## 2015-10-18 LAB — CBC
HCT: 41 % (ref 36.0–46.0)
HEMOGLOBIN: 13.7 g/dL (ref 12.0–15.0)
MCH: 31.9 pg (ref 26.0–34.0)
MCHC: 33.4 g/dL (ref 30.0–36.0)
MCV: 95.6 fL (ref 78.0–100.0)
Platelets: 102 10*3/uL — ABNORMAL LOW (ref 150–400)
RBC: 4.29 MIL/uL (ref 3.87–5.11)
RDW: 14.4 % (ref 11.5–15.5)
WBC: 4.6 10*3/uL (ref 4.0–10.5)

## 2015-10-18 NOTE — Progress Notes (Signed)
PCP - Dr. Woody Seller in [facility] Cardiologist - Dr. Vista Mink but has not seen recently  EKG - 10/18/15 CXR - 10/18/15  Echo- 2006 Stress test - 2015 - requested Cardiac Cath - denies  Patient denies chest pain.  Patient is short of breath upon arrival to nurses office and patient states that she has had shortness of breath for the past 3 months.  Patient informs nurse that she saw her PCP in June and he was aware of the shortness of breath and that this is how she is every summer especially since she has been out of work.    Myra Gianotti, PA consulted at PAT appointment.

## 2015-10-18 NOTE — Progress Notes (Addendum)
Anesthesia PAT Evaluation: Patient is a 65 year old female scheduled for laparoscopic repair incisional hernia with mesh on 10/24/15 by Dr. Georganna Skeans.  History includes non-smoker, COPD, childhood asthma, SOB, HTN, hepatitis C s/p treatment (Dr. Doristine Mango), splenomegaly, GERD, diverticulitis s/p colon resection, anxiety, depression, migraines, insomnia, numbness/tingling (hands), hysterectomy, right TKA.  PCP is Dr. Woody Seller in [place]. He has put her on activity restrictions including out of work since 09/04/15 until after she recovers from her surgery.    She saw cardiologist Dr. Remer Macho with [facility] on 06/22/13 for chest pain follow-up. She also has history of PVCs on EKG. Reportedly echo at PCP office showed EF 60-65% with mild MR. Stress test was ordered (requested)  She saw pulmonologist Dr. Lamonte Sakai in 2015-2016. PFTS showed normal values with no obstruction. There was no evidence of fixed asthma. He thought her chronic cough was likely from uncontrolled GERD and allergies/rhinitis. He referred her to GI Dr. Carlean Purl. Patient reports chronic cough much better now. She is on Reglan, Zantac, and Claritin. She still has issues with allergic rhinitis, especially in the summer months.  Meds include albuterol, Xanax, ASA 21m, Celebrex, butalbital-APAP-caffeine, Lexapro, Claritin, Reglan, Toprol XL, Zantac, simethicone, Restoril.    PAT Vitals: BP 141/76, HR 76, RR 20, T 36.4C, O2 sat 100%. BMI 32.82.  Patient seen at PAT due to RN reports that patient appeared to have significant DOE when she arrived at PAT from walking into the hospital. RA O2 sats were 100% with vitals. When I evaluated patient there was no significant SOB or conversational dyspnea noted. She had been sitting for several minutes. We walked down the PAT hallway, and she was able to answer my questions while walking. She reported that when she is exposed to hot temperatures (as she is during the summer months), she is  more prone to having DOE. This is a long time issue for her. She denied recent illness, but still has issues with allergic rhinitis/allergies. She may have had a low grade fever in the past month (felt warm, but temperature not taken), but not currently. As above, her coughing is much improved since she saw pulmonology and GI and was started Reglan. She denied any issues with anesthesia or history of requiring post-operative ventilator. She was put on restrictions on 09/04/15, but prior to that could clean her house including mopping and vacuuming without significant SOB. She denied chest pain. No significant edema. On exam, her heart has a RRR, no murmur noted. Lungs clear throughout. Trace (at most) pedal edema.   10/18/15 EKG: NSR. PVCs resolved since previous tracing.  06/27/13 (?) Nuclear stress test ([facility]): Requested.  2015 (?) Echo (Dr. VWoody Seller: Requested.  10/18/15 CXR: IMPRESSION: There is no active cardiopulmonary disease.   01/06/14 PFTs: FVC 3.66 (84%), FEV1 2.10 (86%), DLCOunc 20.61 (87%).  Preoperative labs noted. H/H WNL. PLT 102K, stable since last year. AST/ALT WNL. Cr 0.72.  Chart will be left for follow-up stress and echo results.  AGeorge HughMSeaside Surgical LLCShort Stay Center/Anesthesiology Phone ([phone]/05/2015 6:05 PM  Addendum: Records received from Dr. VWoody Seller last visit 09/13/15. He referred patient to [facility] for evaluation of possible hernia repair.   05/23/13 Echo ([facility]): Conclusions: The LV is upper limits of normal to mildly dilated. LVEF 60-65%. The RV is upper limits of normal in size with prominent moderator band. Mild MR. AV is mildly sclerotic. Mild TR with RVSP 30-338mg.   06/27/13 Nuclear stress test ([facility]): Conclusions: 1. No evidence  of inducible ischemia during Lexiscan Cardiolite stress test. There is a fixed defect (moderate in size, mild in intensity) involving the apex and extending to the mid inferolateral wall. The wall motion is these regions is  normal. This pattern suggests soft tissue attenuation. 2. Normal LV systolic function and wall motion with a calculated LVEF of 72%. 3. No ECG evidence to suggest ischemia during administration of Lexiscan. PVCs noted.     If no acute cardiopulmonary issues then I would anticipate that she could proceed as planned.  George Hugh [facility] Short Stay Center/Anesthesiology Phone [phone] 10/23/2015 4:15 PM

## 2015-10-23 MED ORDER — CEFAZOLIN SODIUM-DEXTROSE 2-4 GM/100ML-% IV SOLN
2.0000 g | INTRAVENOUS | Status: AC
Start: 2015-10-24 — End: 2015-10-24
  Administered 2015-10-24: 2 g via INTRAVENOUS
  Filled 2015-10-23: qty 100

## 2015-10-24 ENCOUNTER — Ambulatory Visit (HOSPITAL_COMMUNITY): Payer: Medicare Other | Admitting: Vascular Surgery

## 2015-10-24 ENCOUNTER — Ambulatory Visit (HOSPITAL_COMMUNITY): Payer: Medicare Other | Admitting: Certified Registered"

## 2015-10-24 ENCOUNTER — Encounter (HOSPITAL_COMMUNITY): Payer: Self-pay | Admitting: Surgery

## 2015-10-24 ENCOUNTER — Encounter (HOSPITAL_COMMUNITY)
Admission: RE | Disposition: A | Discharge status: Home / Self Care | Payer: Self-pay | Source: Ambulatory Visit | Attending: General Surgery

## 2015-10-24 ENCOUNTER — Observation Stay (HOSPITAL_COMMUNITY)
Admission: RE | Admit: 2015-10-24 | Discharge: 2015-10-26 | Disposition: A | Discharge status: Home / Self Care | Payer: Medicare Other | Source: Ambulatory Visit | Attending: General Surgery | Admitting: General Surgery

## 2015-10-24 DIAGNOSIS — Z79899 Other long term (current) drug therapy: Secondary | ICD-10-CM | POA: Insufficient documentation

## 2015-10-24 DIAGNOSIS — Z791 Long term (current) use of non-steroidal anti-inflammatories (NSAID): Secondary | ICD-10-CM | POA: Insufficient documentation

## 2015-10-24 DIAGNOSIS — I10 Essential (primary) hypertension: Secondary | ICD-10-CM | POA: Insufficient documentation

## 2015-10-24 DIAGNOSIS — K219 Gastro-esophageal reflux disease without esophagitis: Secondary | ICD-10-CM | POA: Diagnosis not present

## 2015-10-24 DIAGNOSIS — Z8719 Personal history of other diseases of the digestive system: Secondary | ICD-10-CM

## 2015-10-24 DIAGNOSIS — K432 Incisional hernia without obstruction or gangrene: Secondary | ICD-10-CM | POA: Diagnosis not present

## 2015-10-24 DIAGNOSIS — Z7982 Long term (current) use of aspirin: Secondary | ICD-10-CM | POA: Insufficient documentation

## 2015-10-24 DIAGNOSIS — F419 Anxiety disorder, unspecified: Secondary | ICD-10-CM | POA: Insufficient documentation

## 2015-10-24 DIAGNOSIS — K660 Peritoneal adhesions (postprocedural) (postinfection): Secondary | ICD-10-CM | POA: Diagnosis not present

## 2015-10-24 DIAGNOSIS — Z9889 Other specified postprocedural states: Secondary | ICD-10-CM

## 2015-10-24 HISTORY — PX: INSERTION OF MESH: SHX5868

## 2015-10-24 HISTORY — PX: INCISIONAL HERNIA REPAIR: SHX193

## 2015-10-24 LAB — CBC
HEMATOCRIT: 38.4 % (ref 36.0–46.0)
HEMOGLOBIN: 13 g/dL (ref 12.0–15.0)
MCH: 31.6 pg (ref 26.0–34.0)
MCHC: 33.9 g/dL (ref 30.0–36.0)
MCV: 93.4 fL (ref 78.0–100.0)
Platelets: 103 10*3/uL — ABNORMAL LOW (ref 150–400)
RBC: 4.11 MIL/uL (ref 3.87–5.11)
RDW: 14.1 % (ref 11.5–15.5)
WBC: 6.9 10*3/uL (ref 4.0–10.5)

## 2015-10-24 LAB — CREATININE, SERUM: Creatinine, Ser: 0.92 mg/dL (ref 0.44–1.00)

## 2015-10-24 SURGERY — REPAIR, HERNIA, INCISIONAL, LAPAROSCOPIC
Anesthesia: General | Site: Abdomen

## 2015-10-24 MED ORDER — KCL IN DEXTROSE-NACL 20-5-0.45 MEQ/L-%-% IV SOLN
INTRAVENOUS | Status: DC
  Administered 2015-10-24 – 2015-10-25 (×2): via INTRAVENOUS
  Filled 2015-10-24 (×2): qty 1000

## 2015-10-24 MED ORDER — SUGAMMADEX SODIUM 200 MG/2ML IV SOLN
INTRAVENOUS | Status: AC
  Filled 2015-10-24: qty 2

## 2015-10-24 MED ORDER — PROPOFOL 10 MG/ML IV BOLUS
INTRAVENOUS | Status: DC | PRN
  Administered 2015-10-24: 120 mg via INTRAVENOUS

## 2015-10-24 MED ORDER — METOCLOPRAMIDE HCL 10 MG PO TABS
10.0000 mg | ORAL_TABLET | Freq: Two times a day (BID) | ORAL | Status: DC
  Administered 2015-10-24 – 2015-10-26 (×3): 10 mg via ORAL
  Filled 2015-10-24 (×3): qty 1

## 2015-10-24 MED ORDER — ONDANSETRON HCL 4 MG/2ML IJ SOLN
INTRAMUSCULAR | Status: DC | PRN
  Administered 2015-10-24: 4 mg via INTRAVENOUS

## 2015-10-24 MED ORDER — LIDOCAINE HCL (PF) 2 % IJ SOLN
INTRAMUSCULAR | Status: DC | PRN
  Administered 2015-10-24: 10 mg via INTRADERMAL
  Administered 2015-10-24: 40 mg via INTRADERMAL

## 2015-10-24 MED ORDER — ALPRAZOLAM 0.5 MG PO TABS
1.0000 mg | ORAL_TABLET | Freq: Four times a day (QID) | ORAL | Status: DC
  Administered 2015-10-24 – 2015-10-26 (×8): 1 mg via ORAL
  Filled 2015-10-24 (×8): qty 2

## 2015-10-24 MED ORDER — KETOROLAC TROMETHAMINE 30 MG/ML IJ SOLN
30.0000 mg | Freq: Once | INTRAMUSCULAR | Status: AC | PRN
  Administered 2015-10-24: 30 mg via INTRAVENOUS

## 2015-10-24 MED ORDER — PROMETHAZINE HCL 25 MG/ML IJ SOLN: 6.2500 mg | INTRAMUSCULAR | Status: DC | PRN

## 2015-10-24 MED ORDER — HYDROMORPHONE HCL 1 MG/ML IJ SOLN
0.5000 mg | INTRAMUSCULAR | Status: DC | PRN
  Administered 2015-10-25: 0.5 mg via INTRAVENOUS
  Filled 2015-10-24: qty 1

## 2015-10-24 MED ORDER — FENTANYL CITRATE (PF) 100 MCG/2ML IJ SOLN
INTRAMUSCULAR | Status: DC | PRN
  Administered 2015-10-24: 50 ug via INTRAVENOUS
  Administered 2015-10-24: 100 ug via INTRAVENOUS
  Administered 2015-10-24: 50 ug via INTRAVENOUS
  Administered 2015-10-24: 100 ug via INTRAVENOUS
  Administered 2015-10-24: 50 ug via INTRAVENOUS

## 2015-10-24 MED ORDER — CHLORHEXIDINE GLUCONATE CLOTH 2 % EX PADS: 6.0000 | MEDICATED_PAD | Freq: Once | CUTANEOUS | Status: DC

## 2015-10-24 MED ORDER — ROCURONIUM BROMIDE 10 MG/ML (PF) SYRINGE
PREFILLED_SYRINGE | INTRAVENOUS | Status: DC | PRN
  Administered 2015-10-24: 40 mg via INTRAVENOUS
  Administered 2015-10-24 (×2): 10 mg via INTRAVENOUS

## 2015-10-24 MED ORDER — LIDOCAINE 2% (20 MG/ML) 5 ML SYRINGE
INTRAMUSCULAR | Status: AC
  Filled 2015-10-24: qty 5

## 2015-10-24 MED ORDER — DIPHENHYDRAMINE HCL 50 MG/ML IJ SOLN
INTRAMUSCULAR | Status: AC
  Filled 2015-10-24: qty 1

## 2015-10-24 MED ORDER — ESCITALOPRAM OXALATE 20 MG PO TABS
20.0000 mg | ORAL_TABLET | Freq: Every day | ORAL | Status: DC
  Administered 2015-10-24 – 2015-10-26 (×3): 20 mg via ORAL
  Filled 2015-10-24 (×3): qty 1

## 2015-10-24 MED ORDER — OXYCODONE HCL 5 MG/5ML PO SOLN: 5.0000 mg | Freq: Once | ORAL | Status: DC | PRN

## 2015-10-24 MED ORDER — DIPHENHYDRAMINE HCL 12.5 MG/5ML PO ELIX: 12.5000 mg | ORAL_SOLUTION | Freq: Four times a day (QID) | ORAL | Status: DC | PRN

## 2015-10-24 MED ORDER — DEXAMETHASONE SODIUM PHOSPHATE 10 MG/ML IJ SOLN
INTRAMUSCULAR | Status: AC
  Filled 2015-10-24: qty 1

## 2015-10-24 MED ORDER — DEXAMETHASONE SODIUM PHOSPHATE 10 MG/ML IJ SOLN
INTRAMUSCULAR | Status: DC | PRN
  Administered 2015-10-24: 10 mg via INTRAVENOUS

## 2015-10-24 MED ORDER — TEMAZEPAM 15 MG PO CAPS
15.0000 mg | ORAL_CAPSULE | Freq: Every day | ORAL | Status: DC
  Administered 2015-10-24 – 2015-10-25 (×2): 15 mg via ORAL
  Filled 2015-10-24 (×2): qty 1

## 2015-10-24 MED ORDER — OXYCODONE HCL 5 MG PO TABS
5.0000 mg | ORAL_TABLET | ORAL | Status: DC | PRN
  Administered 2015-10-24 – 2015-10-25 (×2): 10 mg via ORAL
  Administered 2015-10-25: 15 mg via ORAL
  Administered 2015-10-25 – 2015-10-26 (×3): 10 mg via ORAL
  Filled 2015-10-24 (×3): qty 2
  Filled 2015-10-24: qty 3
  Filled 2015-10-24 (×2): qty 2

## 2015-10-24 MED ORDER — LACTATED RINGERS IV SOLN
INTRAVENOUS | Status: DC
  Administered 2015-10-24 (×2): via INTRAVENOUS

## 2015-10-24 MED ORDER — DIPHENHYDRAMINE HCL 50 MG/ML IJ SOLN: 12.5000 mg | Freq: Four times a day (QID) | INTRAMUSCULAR | Status: DC | PRN

## 2015-10-24 MED ORDER — ONDANSETRON HCL 4 MG/2ML IJ SOLN
INTRAMUSCULAR | Status: AC
  Filled 2015-10-24: qty 2

## 2015-10-24 MED ORDER — OXYCODONE HCL 5 MG PO TABS: 5.0000 mg | ORAL_TABLET | Freq: Once | ORAL | Status: DC | PRN

## 2015-10-24 MED ORDER — MIDAZOLAM HCL 2 MG/2ML IJ SOLN
INTRAMUSCULAR | Status: AC
  Administered 2015-10-24: 2 mg via INTRAVENOUS
  Filled 2015-10-24: qty 2

## 2015-10-24 MED ORDER — HYDROMORPHONE HCL 1 MG/ML IJ SOLN
INTRAMUSCULAR | Status: AC
  Administered 2015-10-24: 0.25 mg via INTRAVENOUS
  Filled 2015-10-24: qty 1

## 2015-10-24 MED ORDER — MIDAZOLAM HCL 2 MG/2ML IJ SOLN
INTRAMUSCULAR | Status: AC
  Filled 2015-10-24: qty 2

## 2015-10-24 MED ORDER — SUGAMMADEX SODIUM 200 MG/2ML IV SOLN
INTRAVENOUS | Status: DC | PRN
  Administered 2015-10-24: 200 mg via INTRAVENOUS

## 2015-10-24 MED ORDER — BUPIVACAINE-EPINEPHRINE (PF) 0.5% -1:200000 IJ SOLN
INTRAMUSCULAR | Status: AC
  Filled 2015-10-24: qty 30

## 2015-10-24 MED ORDER — 0.9 % SODIUM CHLORIDE (POUR BTL) OPTIME
TOPICAL | Status: DC | PRN
  Administered 2015-10-24: 1000 mL

## 2015-10-24 MED ORDER — BUPIVACAINE-EPINEPHRINE (PF) 0.5% -1:200000 IJ SOLN
INTRAMUSCULAR | Status: DC | PRN
  Administered 2015-10-24: 22 mL

## 2015-10-24 MED ORDER — PROPOFOL 10 MG/ML IV BOLUS
INTRAVENOUS | Status: AC
  Filled 2015-10-24: qty 20

## 2015-10-24 MED ORDER — LORATADINE 10 MG PO TABS
10.0000 mg | ORAL_TABLET | Freq: Every day | ORAL | Status: DC
  Administered 2015-10-24 – 2015-10-26 (×3): 10 mg via ORAL
  Filled 2015-10-24 (×3): qty 1

## 2015-10-24 MED ORDER — ALBUTEROL SULFATE (2.5 MG/3ML) 0.083% IN NEBU: 2.5000 mg | INHALATION_SOLUTION | Freq: Four times a day (QID) | RESPIRATORY_TRACT | Status: DC | PRN

## 2015-10-24 MED ORDER — ALBUTEROL SULFATE HFA 108 (90 BASE) MCG/ACT IN AERS: 2.0000 | INHALATION_SPRAY | Freq: Four times a day (QID) | RESPIRATORY_TRACT | Status: DC | PRN

## 2015-10-24 MED ORDER — METOPROLOL TARTRATE 50 MG PO TABS
50.0000 mg | ORAL_TABLET | ORAL | Status: DC
  Administered 2015-10-25 – 2015-10-26 (×2): 50 mg via ORAL
  Filled 2015-10-24 (×2): qty 1

## 2015-10-24 MED ORDER — ROCURONIUM BROMIDE 10 MG/ML (PF) SYRINGE
PREFILLED_SYRINGE | INTRAVENOUS | Status: AC
  Filled 2015-10-24: qty 10

## 2015-10-24 MED ORDER — DIPHENHYDRAMINE HCL 50 MG/ML IJ SOLN
12.5000 mg | Freq: Once | INTRAMUSCULAR | Status: AC
  Administered 2015-10-24: 12.5 mg via INTRAVENOUS

## 2015-10-24 MED ORDER — METOPROLOL TARTRATE 25 MG PO TABS
25.0000 mg | ORAL_TABLET | Freq: Every day | ORAL | Status: DC
  Administered 2015-10-24 – 2015-10-25 (×2): 25 mg via ORAL
  Filled 2015-10-24 (×2): qty 1

## 2015-10-24 MED ORDER — FENTANYL CITRATE (PF) 250 MCG/5ML IJ SOLN
INTRAMUSCULAR | Status: AC
  Filled 2015-10-24: qty 5

## 2015-10-24 MED ORDER — MIDAZOLAM HCL 2 MG/2ML IJ SOLN
2.0000 mg | Freq: Once | INTRAMUSCULAR | Status: AC
  Administered 2015-10-24: 2 mg via INTRAVENOUS

## 2015-10-24 MED ORDER — MIDAZOLAM HCL 5 MG/5ML IJ SOLN
INTRAMUSCULAR | Status: DC | PRN
  Administered 2015-10-24 (×2): 1 mg via INTRAVENOUS

## 2015-10-24 MED ORDER — HYDROMORPHONE HCL 1 MG/ML IJ SOLN
0.2500 mg | INTRAMUSCULAR | Status: DC | PRN
  Administered 2015-10-24 (×4): 0.25 mg via INTRAVENOUS

## 2015-10-24 MED ORDER — ENOXAPARIN SODIUM 40 MG/0.4ML ~~LOC~~ SOLN
40.0000 mg | SUBCUTANEOUS | Status: DC
  Administered 2015-10-25 – 2015-10-26 (×2): 40 mg via SUBCUTANEOUS
  Filled 2015-10-24 (×2): qty 0.4

## 2015-10-24 MED ORDER — KETOROLAC TROMETHAMINE 30 MG/ML IJ SOLN
INTRAMUSCULAR | Status: AC
Start: 2015-10-24 — End: 2015-10-24
  Administered 2015-10-24: 30 mg via INTRAVENOUS
  Filled 2015-10-24: qty 1

## 2015-10-24 MED ORDER — HYDRALAZINE HCL 20 MG/ML IJ SOLN: 10.0000 mg | INTRAMUSCULAR | Status: DC | PRN

## 2015-10-24 MED ORDER — METHOCARBAMOL 500 MG PO TABS: 500.0000 mg | ORAL_TABLET | Freq: Four times a day (QID) | ORAL | Status: DC | PRN

## 2015-10-24 SURGICAL SUPPLY — 53 items
APPLIER CLIP 5 13 M/L LIGAMAX5 (MISCELLANEOUS)
APPLIER CLIP ROT 10 11.4 M/L (STAPLE)
BINDER ABD UNIV 12 45-62 (WOUND CARE) ×1 IMPLANT
BINDER ABDOMINAL 46IN 62IN (WOUND CARE) ×3
BLADE SURG ROTATE 9660 (MISCELLANEOUS) IMPLANT
CANISTER SUCTION 2500CC (MISCELLANEOUS) IMPLANT
CHLORAPREP W/TINT 26ML (MISCELLANEOUS) ×3 IMPLANT
CLIP APPLIE 5 13 M/L LIGAMAX5 (MISCELLANEOUS) IMPLANT
CLIP APPLIE ROT 10 11.4 M/L (STAPLE) IMPLANT
COVER SURGICAL LIGHT HANDLE (MISCELLANEOUS) ×3 IMPLANT
DEVICE SECURE STRAP 25 ABSORB (INSTRUMENTS) ×6 IMPLANT
DEVICE TROCAR PUNCTURE CLOSURE (ENDOMECHANICALS) ×3 IMPLANT
ELECT REM PT RETURN 9FT ADLT (ELECTROSURGICAL) ×3
ELECTRODE REM PT RTRN 9FT ADLT (ELECTROSURGICAL) ×1 IMPLANT
GLOVE BIO SURGEON STRL SZ8 (GLOVE) ×6 IMPLANT
GLOVE BIOGEL PI IND STRL 7.0 (GLOVE) ×2 IMPLANT
GLOVE BIOGEL PI IND STRL 8 (GLOVE) ×2 IMPLANT
GLOVE BIOGEL PI IND STRL 8.5 (GLOVE) ×1 IMPLANT
GLOVE BIOGEL PI INDICATOR 7.0 (GLOVE) ×4
GLOVE BIOGEL PI INDICATOR 8 (GLOVE) ×4
GLOVE BIOGEL PI INDICATOR 8.5 (GLOVE) ×2
GLOVE ECLIPSE 7.5 STRL STRAW (GLOVE) ×3 IMPLANT
GOWN STRL REUS W/ TWL LRG LVL3 (GOWN DISPOSABLE) ×2 IMPLANT
GOWN STRL REUS W/ TWL XL LVL3 (GOWN DISPOSABLE) ×1 IMPLANT
GOWN STRL REUS W/TWL LRG LVL3 (GOWN DISPOSABLE) ×4
GOWN STRL REUS W/TWL XL LVL3 (GOWN DISPOSABLE) ×2
GRASPER SUT TROCAR 14GX15 (MISCELLANEOUS) ×3 IMPLANT
KIT BASIN OR (CUSTOM PROCEDURE TRAY) ×3 IMPLANT
KIT ROOM TURNOVER OR (KITS) ×3 IMPLANT
LIQUID BAND (GAUZE/BANDAGES/DRESSINGS) ×3 IMPLANT
MARKER SKIN DUAL TIP RULER LAB (MISCELLANEOUS) ×3 IMPLANT
MESH VENTRALIGHT ST 6X8 (Mesh Specialty) ×2 IMPLANT
MESH VENTRLGHT ELLIPSE 8X6XMFL (Mesh Specialty) ×1 IMPLANT
NEEDLE 22X1 1/2 (OR ONLY) (NEEDLE) ×3 IMPLANT
NEEDLE SPNL 22GX3.5 QUINCKE BK (NEEDLE) ×3 IMPLANT
NS IRRIG 1000ML POUR BTL (IV SOLUTION) ×3 IMPLANT
PAD ARMBOARD 7.5X6 YLW CONV (MISCELLANEOUS) ×6 IMPLANT
SCALPEL HARMONIC ACE (MISCELLANEOUS) IMPLANT
SCISSORS LAP 5X35 DISP (ENDOMECHANICALS) ×3 IMPLANT
SET IRRIG TUBING LAPAROSCOPIC (IRRIGATION / IRRIGATOR) IMPLANT
SHEARS HARMONIC ACE PLUS 36CM (ENDOMECHANICALS) ×3 IMPLANT
SLEEVE ENDOPATH XCEL 5M (ENDOMECHANICALS) ×6 IMPLANT
SUT PROLENE 0 CT 1 CR/8 (SUTURE) ×3 IMPLANT
SUT VIC AB 4-0 PS2 27 (SUTURE) ×3 IMPLANT
SUT VICRYL 0 TIES 12 18 (SUTURE) IMPLANT
TOWEL OR 17X24 6PK STRL BLUE (TOWEL DISPOSABLE) ×3 IMPLANT
TOWEL OR 17X26 10 PK STRL BLUE (TOWEL DISPOSABLE) ×3 IMPLANT
TRAY FOLEY CATH 16FR SILVER (SET/KITS/TRAYS/PACK) ×3 IMPLANT
TRAY LAPAROSCOPIC MC (CUSTOM PROCEDURE TRAY) ×3 IMPLANT
TROCAR XCEL BLUNT TIP 100MML (ENDOMECHANICALS) IMPLANT
TROCAR XCEL NON-BLD 11X100MML (ENDOMECHANICALS) ×3 IMPLANT
TROCAR XCEL NON-BLD 5MMX100MML (ENDOMECHANICALS) ×3 IMPLANT
TUBING INSUFFLATION (TUBING) ×3 IMPLANT

## 2015-10-24 NOTE — Progress Notes (Signed)
  Pt admitted to the unit. Pt is stable, alert and oriented per baseline. Oriented to room, staff, and call bell. Educated to call for any assistance. Bed in lowest position, call bell within reach- will continue to monitor. 

## 2015-10-24 NOTE — Anesthesia Postprocedure Evaluation (Signed)
Anesthesia Post Note  Patient: MARCELL PFEIFER  Procedure(s) Performed: Procedure(s) (LRB): LAPAROSCOPIC REPAIR INCISIONAL HERNIA WITH MESH (N/A) INSERTION OF MESH (N/A)  Patient location during evaluation: PACU Anesthesia Type: General Level of consciousness: awake and alert Pain management: pain level controlled Vital Signs Assessment: post-procedure vital signs reviewed and stable Respiratory status: spontaneous breathing, nonlabored ventilation, respiratory function stable and patient connected to nasal cannula oxygen Cardiovascular status: blood pressure returned to baseline and stable Postop Assessment: no signs of nausea or vomiting Anesthetic complications: no    Last Vitals:  Vitals:   10/24/15 1537 10/24/15 1552  BP: (!) 149/84 139/71  Pulse: 70 67  Resp: 14 17  Temp:      Last Pain:  Vitals:   10/24/15 1540  TempSrc:   PainSc: 6                  Jennavieve Arrick S

## 2015-10-24 NOTE — Transfer of Care (Signed)
Immediate Anesthesia Transfer of Care Note  Patient: [name]  Procedure(s) Performed: Procedure(s): LAPAROSCOPIC REPAIR INCISIONAL HERNIA WITH MESH (N/A) INSERTION OF MESH (N/A)  Patient Location: PACU  Anesthesia Type:General  Level of Consciousness: awake, alert , oriented and patient cooperative  Airway & Oxygen Therapy: Patient Spontanous Breathing and Patient connected to nasal cannula oxygen  Post-op Assessment: Report given to RN, Post -op Vital signs reviewed and stable and Patient moving all extremities  Post vital signs: Reviewed and stable  Last Vitals:  Vitals:   10/24/15 1235 10/24/15 1240  BP:  127/76  Pulse: 65 66  Resp: (!) 8 14  Temp:      Last Pain:  Vitals:   10/24/15 1104  TempSrc: Oral      Patients Stated Pain Goal: 3 (97/94/80 1655)  Complications: No apparent anesthesia complications

## 2015-10-24 NOTE — Op Note (Signed)
10/24/2015  2:51 PM  PATIENT:  [name]  65 y.o. female  PRE-OPERATIVE DIAGNOSIS:  Incisional Hernia without obstruction or gangrene  POST-OPERATIVE DIAGNOSIS: Incisional Hernia without obstruction or gangrene  PROCEDURE:  Procedure(s): LAPAROSCOPIC REPAIR INCISIONAL HERNIA WITH MESH LAPAROSCOPIC LYSIS OF ADHESIONS FOR 30 MINUTES INSERTION OF MESH  SURGEON:  Surgeon(s): Georganna Skeans, MD  ASSISTANTS: none   ANESTHESIA:   local and general  EBL:  Total I/O In: 1000 [I.V.:1000] Out: 135 [Urine:125; Blood:10]  BLOOD ADMINISTERED:none  DRAINS: none   SPECIMEN:  No Specimen  DISPOSITION OF SPECIMEN:  N/A  COUNTS:  YES  DICTATION: .Dragon Dictation Findings: Incisional hernia 2 containing omentum  Procedure in detail: Dafney presents for laparoscopic repair of incisional hernia with mesh. She was identified in the preop holding area. Informed consent was obtained. She received intravenous antibiotics. She was taken to the operating room and general endotracheal anesthesia was administered by the anesthesia staff. Foley catheter was placed by nursing. Her abdomen was prepped and draped in a sterile fashion. Time out procedure was performed. An area along the left costal margin was chosen for Optiview entry. Local was injected, a small incision was made, and then a 5 mm Optiview port was placed without complication. The abdomen was insufflated with carbon dioxide in standard fashion. Laparoscopic exploration revealed no complications from Optiview insertion. There were omental adhesions up into the hernia defects around the umbilicus and extending cephalad towards the falciform. A 12 mm left lower quadrant port was placed under direct vision. Adhesiolysis was then done bringing the omentum down out of the hernia sac and using the harmonic scalpel to get excellent hemostasis. This took approximately 30 minutes. Again no bowel was involved in the hernias. Once all of that was  reduced, a 5 mm right upper quadrant and a 5 mm right lower quadrant port were placed under direct vision. Local was used at each port site. The hernia defect was measured out using a spinal needle and a chills and approximately 15 x 20 cm mesh. The mesh was marked for orientation and 6 0 Prolenes were placed circumferentially. The mesh was moistened and inserted into the abdomen. It was unfurled and appropriately positioned. A small stab incision was made at each suture location on the anterior abdominal wall. A suture grasper was then used to grasp each limb of the suture separately. The mesh was brought up against the anterior abdominal wall inside and the sutures were all tied. Next 2 concentric rings of secure strap tacks were placed. The mesh laid very nicely up against the abdominal wall and there was no bleeding. Four-quadrant exploration revealed no other complicating features. Ports were removed under direct vision. Pneumoperitoneum was released. Ports sites were closed with running 4 Vicryl subcuticular followed by liquid band. The small stab sites for the suture ties were also closed with liquid band. All counts were correct. She tolerated the procedure well without apparent complication and was taken recovery in stable condition. Abdominal binder was placed once the glue was dry. PATIENT DISPOSITION:  PACU - hemodynamically stable.   Delay start of Pharmacological VTE agent (>24hrs) due to surgical blood loss or risk of bleeding:  no  Georganna Skeans, MD, MPH, FACS Pager: [phone]  8/9/20172:51 PM

## 2015-10-24 NOTE — Interval H&P Note (Signed)
History and Physical Interval Note:  10/24/2015 12:29 PM  [name]  has presented today for surgery, with the diagnosis of Hernia without obstruction or gangrene  The various methods of treatment have been discussed with the patient and family. After consideration of risks, benefits and other options for treatment, the patient has consented to  Procedure(s): [place] (N/A) INSERTION OF MESH (N/A) as a surgical intervention .  The patient's history has been reviewed, patient examined, no change in status, stable for surgery.  I have reviewed the patient's chart and labs.  Questions were answered to the patient's satisfaction.     [name] E

## 2015-10-24 NOTE — Anesthesia Procedure Notes (Signed)
Procedure Name: Intubation Date/Time: 10/24/2015 1:16 PM Performed by: Lajuana Carry E Pre-anesthesia Checklist: Patient identified, Emergency Drugs available, Suction available and Patient being monitored Patient Re-evaluated:Patient Re-evaluated prior to inductionOxygen Delivery Method: Circle system utilized Preoxygenation: Pre-oxygenation with 100% oxygen Intubation Type: IV induction Ventilation: Mask ventilation without difficulty Laryngoscope Size: Miller and 2 Grade View: Grade I Tube type: Oral Tube size: 7.0 mm Number of attempts: 1 Airway Equipment and Method: Stylet Placement Confirmation: ETT inserted through vocal cords under direct vision,  positive ETCO2 and breath sounds checked- equal and bilateral Secured at: 21 cm Tube secured with: Tape Dental Injury: Teeth and Oropharynx as per pre-operative assessment

## 2015-10-24 NOTE — Anesthesia Preprocedure Evaluation (Signed)
Anesthesia Evaluation  Patient identified by MRN, date of birth, ID band Patient awake    Reviewed: Allergy & Precautions, NPO status , Patient's Chart, lab work & pertinent test results  Airway Mallampati: II  TM Distance: >3 FB Neck ROM: Full    Dental no notable dental hx.    Pulmonary COPD,    Pulmonary exam normal breath sounds clear to auscultation       Cardiovascular hypertension, Normal cardiovascular exam Rhythm:Regular Rate:Normal     Neuro/Psych Anxiety negative neurological ROS     GI/Hepatic GERD  Medicated,(+) Hepatitis -, C  Endo/Other  negative endocrine ROS  Renal/GU negative Renal ROS  negative genitourinary   Musculoskeletal negative musculoskeletal ROS (+)   Abdominal   Peds negative pediatric ROS (+)  Hematology negative hematology ROS (+)   Anesthesia Other Findings   Reproductive/Obstetrics negative OB ROS                             Anesthesia Physical Anesthesia Plan  ASA: III  Anesthesia Plan: General   Post-op Pain Management:    Induction: Intravenous  Airway Management Planned: Oral ETT  Additional Equipment:   Intra-op Plan:   Post-operative Plan: Extubation in OR  Informed Consent: I have reviewed the patients History and Physical, chart, labs and discussed the procedure including the risks, benefits and alternatives for the proposed anesthesia with the patient or authorized representative who has indicated his/her understanding and acceptance.   Dental advisory given  Plan Discussed with: CRNA and Surgeon  Anesthesia Plan Comments:         Anesthesia Quick Evaluation

## 2015-10-24 NOTE — H&P (Signed)
[name] 10/01/2015 10:08 AM Location: [facility] Patient #: [id number] DOB: 1951-02-19 Single / Language: [name] / Race: White Female   History of Present Illness [name] E. Monique Silos MD; 10/01/2015 10:22 AM) The patient is a 65 year old female who presents with an incisional hernia. I was asked to see [name] in consultation by Dr. Woody [name] regarding an incisional hernia. She has also been having some left upper quadrant abdominal pain. She is status post small bowel resection by Dr. Anthony [name] at [facility] about 3 years ago. For a few months she has been having periumbilical pain and swelling. It tends to go away when she lays down at night. 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 Woody [name] into this hernia is taking care of. She also complains of associated constipation as well as localized pain. No nausea or vomiting. Pain in her left upper quadrant is exacerbated by her bra placement. She has not had any surgery in her left upper quadrant.   Other Problems [name], CMA; 10/01/2015 10:08 AM) Anxiety Disorder Arthritis Gastroesophageal Reflux Disease High blood pressure  Past Surgical History [name], CMA; 10/01/2015 10:08 AM) Foot Surgery Right. Hysterectomy (not due to cancer) - Complete Knee Surgery Right.  Diagnostic Studies History [name], [place]; 10/01/2015 10:08 AM) Colonoscopy 1-5 years ago Mammogram within last year  Allergies [name], CMA; 10/01/2015 10:08 AM) Levothyroxine Sodium *CHEMICALS*  Medication History [name], CMA; 10/01/2015 10:11 AM) ALPRAZolam (1MG Tablet, Oral) Active. Butalbital-APAP-Caffeine (50-325-40MG Tablet, Oral) Active. Celecoxib (100MG Capsule, Oral) Active. Escitalopram Oxalate (20MG Tablet, Oral) Active. Hydrocodone-Acetaminophen (5-500MG Capsule, Oral) Active. Loratadine (10MG Tablet, Oral) Active. Meloxicam (7.5MG Tablet, Oral)  Active. Reglan (10MG Tablet, Oral) Active. Metoprolol Succinate ER (50MG Tablet ER 24HR, Oral) Active. Nystatin (Oral) Active. MiraLax (Oral) Active. PredniSONE (10MG Tablet, Oral) Active. RaNITidine HCl (300MG Tablet, Oral) Active. TraZODone HCl (100MG Tablet, Oral) Active. Medications Reconciled  Social History [name], [place]; 10/01/2015 10:08 AM) Alcohol use Occasional alcohol use. Caffeine use Carbonated beverages, Coffee, Tea. No drug use Tobacco use Never smoker.  Family History [name], [place]; 10/01/2015 10:08 AM) Alcohol Abuse Father. Breast Cancer Mother. Heart Disease Mother. Respiratory Condition Brother, Father.  Pregnancy / Birth History [name], CMA; 10/01/2015 10:08 AM) Age of menopause 54-50 Gravida 2 Maternal age 87-20 Para 2    Review of Systems [name] CMA; 10/01/2015 10:08 AM) General Present- Weight Loss. Not Present- Appetite Loss, Chills, Fatigue, Fever, Night Sweats and Weight Gain. HEENT Present- Seasonal Allergies. Not Present- Earache, Hearing Loss, Hoarseness, Nose Bleed, Oral Ulcers, Ringing in the Ears, Sinus Pain, Sore Throat, Visual Disturbances, Wears glasses/contact lenses and Yellow Eyes. Cardiovascular Present- Shortness of Breath. Not Present- Chest Pain, Difficulty Breathing Lying Down, Leg Cramps, Palpitations, Rapid Heart Rate and Swelling of Extremities. Gastrointestinal Present- Abdominal Pain, Constipation, Excessive gas and Indigestion. Not Present- Bloating, Bloody Stool, Change in Bowel Habits, Chronic diarrhea, Difficulty Swallowing, Gets full quickly at meals, Hemorrhoids, Nausea, Rectal Pain and Vomiting. Female Genitourinary Not Present- Frequency, Nocturia, Painful Urination, Pelvic Pain and Urgency. Musculoskeletal Present- Joint Pain, Joint Stiffness and Muscle Pain. Not Present- Back Pain, Muscle Weakness and Swelling of Extremities. Neurological Present- Headaches. Not Present- Decreased  Memory, Fainting, Numbness, Seizures, Tingling, Tremor, Trouble walking and Weakness. Psychiatric Present- Anxiety and Depression. Not Present- Bipolar, Change in Sleep Pattern, Fearful and Frequent crying.  Vitals [name] CMA; 10/01/2015 10:11 AM) 10/01/2015 10:11 AM Weight: 180 lb  Height: 63in Body Surface Area: 1.85 m Body Mass Index: 31.89 kg/m  Temp.: 98.9F(Temporal)  Pulse: 69 (Regular)  BP: 128/82 (Sitting, Left Arm, Standard)       Physical Exam ([name] E. Monique Silos MD; 10/01/2015 10:24 AM) General Mental Status-Alert. General Appearance-Consistent with stated age. Hydration-Well hydrated. Voice-Normal.  Head and Neck Head-normocephalic, atraumatic with no lesions or palpable masses.  Eye Sclera/Conjunctiva - Bilateral-No scleral icterus.  Chest and Lung Exam Chest and lung exam reveals -quiet, even and easy respiratory effort with no use of accessory muscles. Inspection Chest Wall - Normal. Back - normal.  Breast - Did not examine.  Cardiovascular Cardiovascular examination reveals -normal pedal pulses bilaterally. Note: regular rate and rhythm  Abdomen Inspection-Inspection Normal. Palpation/Percussion Palpation and Percussion of the abdomen reveal - Soft, No Rebound tenderness, No Rigidity (guarding) and No hepatosplenomegaly. Tenderness - Left Upper Quadrant and Periumbilical. Note: Periumbilical incisional hernia reduces spontaneously when she lays down, fascial defect approximately 5 cm, mild tenderness along that area and mild tenderness in her left upper quadrant. No left upper quadrant mass is appreciated.   Peripheral Vascular Upper Extremity Inspection - Bilateral - Normal - No Clubbing, No Cyanosis, No Edema, Pulses Intact. Lower Extremity Palpation - Edema - Bilateral - No edema.  Neurologic Neurologic evaluation reveals -alert and oriented x 3 with no impairment of recent or remote memory. Mental  Status-Normal.  Musculoskeletal Global Assessment -Note: no gross deformities.  Normal Exam - Left-Upper Extremity Strength Normal and Lower Extremity Strength Normal. Normal Exam - Right-Upper Extremity Strength Normal and Lower Extremity Strength Normal. Note: Scar from knee surgery right side   Lymphatic Head & Neck  General Head & Neck Lymphatics: Bilateral - Description - Normal. Axillary  General Axillary Region: Bilateral - Description - Normal. Tenderness - Non Tender.    Assessment & Plan [name] E. Monique Silos MD; 10/01/2015 10:25 AM) Fatima Blank HERNIA, WITHOUT OBSTRUCTION OR GANGRENE (K43.2) Impression: I will proceed with CT scan of abdomen and pelvis in order to further evaluate both the hernia and her left upper quadrant abdominal pain. She may need a creatinine check prior to the scan. Once these results are back, I will call her to discuss surgical plans. At this point I would like to plan for laparoscopic incisional hernia repair with mesh. We will know more after the CT.  CT reviewed and shows periumbilical incisional hernia containing fat. Plan: proceed with laparoscopic incisional hernia repair with mesh.  Re-examined this AM - no significant changes. I discussed the procedure, risks, and benefits again and she agrees.  Georganna Skeans, MD, MPH, FACS Trauma: [phone] General Surgery: [phone]

## 2015-10-25 ENCOUNTER — Encounter (HOSPITAL_COMMUNITY): Payer: Self-pay | Admitting: General Surgery

## 2015-10-25 DIAGNOSIS — K432 Incisional hernia without obstruction or gangrene: Secondary | ICD-10-CM | POA: Diagnosis not present

## 2015-10-25 LAB — BASIC METABOLIC PANEL
Anion gap: 5 (ref 5–15)
BUN: 15 mg/dL (ref 6–20)
CALCIUM: 9.4 mg/dL (ref 8.9–10.3)
CHLORIDE: 104 mmol/L (ref 101–111)
CO2: 24 mmol/L (ref 22–32)
CREATININE: 0.89 mg/dL (ref 0.44–1.00)
GFR calc non Af Amer: >60 mL/min (ref >60)
GLUCOSE: 126 mg/dL — AB (ref 65–99)
Potassium: 4.8 mmol/L (ref 3.5–5.1)
Sodium: 133 mmol/L — ABNORMAL LOW (ref 135–145)

## 2015-10-25 LAB — CBC
HEMATOCRIT: 38.1 % (ref 36.0–46.0)
HEMOGLOBIN: 12.7 g/dL (ref 12.0–15.0)
MCH: 31.6 pg (ref 26.0–34.0)
MCHC: 33.3 g/dL (ref 30.0–36.0)
MCV: 94.8 fL (ref 78.0–100.0)
Platelets: 128 10*3/uL — ABNORMAL LOW (ref 150–400)
RBC: 4.02 MIL/uL (ref 3.87–5.11)
RDW: 14.1 % (ref 11.5–15.5)
WBC: 8.1 10*3/uL (ref 4.0–10.5)

## 2015-10-25 NOTE — Care Management Note (Signed)
Case Management Note  Patient Details  Name: [name] MRN: [id number] Date of Birth: 06-20-1950  Subjective/Objective: 1 Day Post-Op  Laparoscopic Repair Incisional Hernia with Mesh(N/A) Insertion of Mesh. Pt is from home with daughter.                  Action/Plan: Plan will be for d/c home one stable. CM will continue to monitor for additional disposition needs.   Expected Discharge Date:  10/27/15               Expected Discharge Plan:  Home/Self Care  In-House Referral:     Discharge planning Services  CM Consult  Post Acute Care Choice:    Choice offered to:     DME Arranged:    DME Agency:     HH Arranged:    HH Agency:     Status of Service:  In process, will continue to follow  If discussed at Long Length of Stay Meetings, dates discussed:    Additional Comments:  [place], [place], RN 10/25/2015, 10:56 AM

## 2015-10-25 NOTE — Progress Notes (Signed)
1 Day Post-Op  Subjective: Up in chair, tolerated some clears.   Objective: Vital signs in last 24 hours: Temp:  [97.9 F (36.6 C)-98.9 F (37.2 C)] 97.9 F (36.6 C) (08/10 0451) Pulse Rate:  [62-76] 62 (08/10 0451) Resp:  [8-20] 16 (08/10 0451) BP: (112-158)/(61-84) 112/61 (08/10 0451) SpO2:  [91 %-100 %] 95 % (08/10 0451) Weight:  [82.6 kg (182 lb)-83.9 kg (185 lb)] 82.6 kg (182 lb) (08/09 1723) Last BM Date: 10/24/15  Intake/Output from previous day: 08/09 0701 - 08/10 0700 In: 1844.2 [P.O.:240; I.V.:1604.2] Out: 935 [Urine:925; Blood:10] Intake/Output this shift: Total I/O In: -  Out: 350 [Urine:350]  General appearance: cooperative Resp: clear to auscultation bilaterally Cardio: regular rate and rhythm GI: soft, expected tenderness, +BS, incisions CDI, binder   Lab Results:   Recent Labs  10/24/15 1848 10/25/15 0545  WBC 6.9 8.1  HGB 13.0 12.7  HCT 38.4 38.1  PLT 103* 128*   BMET  Recent Labs  10/24/15 1848 10/25/15 0545  NA  --  133*  K  --  4.8  CL  --  104  CO2  --  24  GLUCOSE  --  126*  BUN  --  15  CREATININE 0.92 0.89  CALCIUM  --  9.4   PT/INR No results for input(s): LABPROT, INR in the last 72 hours. ABG No results for input(s): PHART, HCO3 in the last 72 hours.  Invalid input(s): PCO2, PO2  Studies/Results: No results found.  Anti-infectives: Anti-infectives    Start     Dose/Rate Route Frequency Ordered Stop   10/24/15 1245  ceFAZolin (ANCEF) IVPB 2g/100 mL premix     2 g 200 mL/hr over 30 Minutes Intravenous To [facility] 10/23/15 1258 10/24/15 1340      Assessment/Plan: s/p Procedure(s): LAPAROSCOPIC REPAIR INCISIONAL HERNIA WITH MESH (N/A) INSERTION OF MESH (N/A) POD#1 - advance diet and SL IV COPD - home meds, proventil Hyponatremia - SL IV Anxiety - home Xanax dose VTE - Lovenox Dispo - hopefully home tomorrow if pain controlled and tolerates diet   LOS: 0 days    Lynnett Langlinais E 10/25/2015

## 2015-10-25 NOTE — Care Management Obs Status (Signed)
[facility] NOTIFICATION   Patient Details  Name: [name] MRN: [id number] Date of Birth: Nov 22, 1950   Medicare Observation Status Notification Given:  Yes    Latrice, [place], RN 10/25/2015, 10:55 AM

## 2015-10-26 DIAGNOSIS — K432 Incisional hernia without obstruction or gangrene: Secondary | ICD-10-CM | POA: Diagnosis not present

## 2015-10-26 LAB — BASIC METABOLIC PANEL
ANION GAP: 8 (ref 5–15)
BUN: 15 mg/dL (ref 6–20)
CHLORIDE: 107 mmol/L (ref 101–111)
CO2: 24 mmol/L (ref 22–32)
Calcium: 9.1 mg/dL (ref 8.9–10.3)
Creatinine, Ser: 0.77 mg/dL (ref 0.44–1.00)
GFR calc Af Amer: >60 mL/min (ref >60)
GFR calc non Af Amer: >60 mL/min (ref >60)
Glucose, Bld: 89 mg/dL (ref 65–99)
POTASSIUM: 4.2 mmol/L (ref 3.5–5.1)
SODIUM: 139 mmol/L (ref 135–145)

## 2015-10-26 LAB — CBC
HCT: 34.1 % — ABNORMAL LOW (ref 36.0–46.0)
HEMOGLOBIN: 11.5 g/dL — AB (ref 12.0–15.0)
MCH: 32.3 pg (ref 26.0–34.0)
MCHC: 33.7 g/dL (ref 30.0–36.0)
MCV: 95.8 fL (ref 78.0–100.0)
Platelets: 112 10*3/uL — ABNORMAL LOW (ref 150–400)
RBC: 3.56 MIL/uL — ABNORMAL LOW (ref 3.87–5.11)
RDW: 14.4 % (ref 11.5–15.5)
WBC: 5.9 10*3/uL (ref 4.0–10.5)

## 2015-10-26 MED ORDER — OXYCODONE HCL 5 MG PO TABS: 5.0000 mg | ORAL_TABLET | ORAL | 0 refills | Status: DC | PRN

## 2015-10-26 NOTE — Discharge Summary (Signed)
Physician Discharge Summary  Patient ID: [name] MRN: [id number] DOB/AGE: 06-14-1950 64 y.o.  Admit date: 10/24/2015 Discharge date: 10/26/2015  Admission Diagnoses:Incisional Hernia  Discharge Diagnoses: Status post laparoscopic repair incisional hernia with mesh Active Problems:   S/P laparoscopic hernia repair   Discharged Condition: good  Hospital Course: [name] underwent uncomplicated laparoscopic repair of incisional hernia with mesh. Postoperatively her pain was controlled and she tolerated advancement of her diet. She is discharged home in stable condition on postoperative day 2.  Consults: None  Significant Diagnostic Studies: none  Treatments: surgery: above  Discharge Exam: Blood pressure (!) 104/59, pulse 64, temperature 98.4 F (36.9 C), temperature source Oral, resp. rate 15, height 5' 3"  (1.6 m), weight 82.6 kg (182 lb), SpO2 97 %. General appearance: alert and cooperative Back: correction lungs clear to auscultation Cardio: regular rate and rhythm GI: Soft, incisions clean dry and intact, no significant tenderness, abdominal binder reapplied  Disposition: 01-Home or Self Care  Discharge Instructions    Call MD for:  redness, tenderness, or signs of infection (pain, swelling, redness, odor or green/yellow discharge around incision site)    Complete by:  As directed   Diet - low sodium heart healthy    Complete by:  As directed   Discharge instructions    Complete by:  As directed   CC[facility] _______[facility]  UMBILICAL OR INGUINAL HERNIA REPAIR: POST OP INSTRUCTIONS  Always review your discharge instruction sheet given to you by the facility where your surgery was performed. IF YOU HAVE DISABILITY OR FAMILY LEAVE FORMS, YOU MUST BRING THEM TO THE OFFICE FOR PROCESSING.   DO NOT GIVE THEM TO YOUR DOCTOR.  A  prescription for pain medication may be given to you upon discharge.  Take your pain medication as prescribed, if needed.  If narcotic  pain medicine is not needed, then you may take acetaminophen (Tylenol) or ibuprofen (Advil) as needed. Take your usually prescribed medications unless otherwise directed. If you need a refill on your pain medication, please contact your pharmacy.  They will contact our office to request authorization. Prescriptions will not be filled after 5 pm or on week-ends. You should follow a light diet the first 24 hours after arrival home, such as soup and crackers, etc.  Be sure to include lots of fluids daily.  Resume your normal diet the day after surgery. Most patients will experience some swelling and bruising around the umbilicus or in the groin and scrotum.  Ice packs and reclining will help.  Swelling and bruising can take several days to resolve.  It is common to experience some constipation if taking pain medication after surgery.  Increasing fluid intake and taking a stool softener (such as Colace) will usually help or prevent this problem from occurring.  A mild laxative (Milk of Magnesia or Miralax) should be taken according to package directions if there are no bowel movements after 48 hours. Unless discharge instructions indicate otherwise, you may remove your bandages 24-48 hours after surgery, and you may shower at that time.  You may have steri-strips (small skin tapes) in place directly over the incision.  These strips should be left on the skin for 7-10 days.  If your surgeon used skin glue on the incision, you may shower in 24 hours.  The glue will flake off over the next 2-3 weeks.  Any sutures or staples will be removed at the office during your follow-up visit. ACTIVITIES:  You may resume regular (light) daily  activities beginning the next day-such as daily self-care, walking, climbing stairs-gradually increasing activities as tolerated.  You may have sexual intercourse when it is comfortable.  Refrain from any heavy lifting or straining until approved by your doctor. You may drive when you are  no longer taking prescription pain medication, you can comfortably wear a seatbelt, and you can safely maneuver your car and apply brakes. RETURN TO WORK:  __________________________________________________________ Dennis Bast should see your doctor in the office for a follow-up appointment approximately 2-3 weeks after your surgery.  Make sure that you call for this appointment within a day or two after you arrive home to insure a convenient appointment time. OTHER INSTRUCTIONS:  __________________________________________________________________________________________________________________________________________________________________________________________  WHEN TO CALL YOUR DOCTOR: Fever over 101.0 Inability to urinate Nausea and/or vomiting Extreme swelling or bruising Continued bleeding from incision. Increased pain, redness, or drainage from the incision  The clinic staff is available to answer your questions during regular business hours.  Please don't hesitate to call and ask to speak to one of the nurses for clinical concerns.  If you have a medical emergency, go to the nearest emergency room or call 911.  A surgeon from [facility] is always on call at the hospital   [address] [place], [place], [place]  [address] ?  [address], [place], [place]   [address] [phone] ? [phone] ? FAX [phone] Web site: [email or web address]   Increase activity slowly    Complete by:  As directed   Lifting restrictions    Complete by:  As directed   Do not lift over 10lb for 6 weeks       Medication List    TAKE these medications   albuterol 108 (90 Base) MCG/ACT inhaler Commonly known as:  PROVENTIL HFA;VENTOLIN HFA Inhale 2 puffs into the lungs every 6 (six) hours as needed for wheezing or shortness of breath.   albuterol (2.5 MG/3ML) 0.083% nebulizer solution Commonly known as:  PROVENTIL Take 2.5 mg by nebulization every 6 (six) hours as  needed for wheezing or shortness of breath.   ALPRAZolam 1 MG tablet Commonly known as:  XANAX Take 1 tablet (1 mg total) by mouth 4 (four) times daily.   aspirin EC 81 MG tablet Take 81 mg by mouth daily.   Butalbital-APAP-Caffeine 50-300-40 MG Caps Take 1 tablet by mouth as needed (Migraine).   celecoxib 100 MG capsule Commonly known as:  CELEBREX Take 100 mg by mouth 2 (two) times daily.   escitalopram 20 MG tablet Commonly known as:  LEXAPRO Take 1 tablet (20 mg total) by mouth daily.   GAS RELIEF PO Take 2 capsules by mouth as needed (flatulence).   loratadine 10 MG tablet Commonly known as:  CLARITIN TAKE ONE TABLET BY MOUTH ONCE DAILY   meloxicam 15 MG tablet Commonly known as:  MOBIC TAKE ONE TABLET BY MOUTH DAILY   metoCLOPramide 10 MG tablet Commonly known as:  REGLAN Take 1 tablet every morning with breakfast and at bedtime   metoprolol succinate 50 MG 24 hr tablet Commonly known as:  TOPROL-XL Take 50-75 mg by mouth 2 (two) times daily. Take 50 mg every morning Take 25 mg at bedtime   MULTI-VITAMIN PO Take 1 tablet by mouth daily.   nystatin powder Commonly known as:  MYCOSTATIN/NYSTOP Apply 1 g topically as needed (under breast).   oxyCODONE 5 MG immediate release tablet Commonly known as:  Oxy IR/ROXICODONE Take 1-3 tablets (5-15 mg total) by mouth every 4 (  four) hours as needed (78m for mild pain, 15mfor moderate pain, 156mor severe pain).   polyethylene glycol powder powder Commonly known as:  MIRALAX Take 17 g by mouth daily. What changed:  when to take this  reasons to take this   ranitidine 300 MG tablet Commonly known as:  ZANTAC Take 300 mg by mouth at bedtime.   STOOL SOFTENER PO Take 1 capsule by mouth every evening.   temazepam 15 MG capsule Commonly known as:  RESTORIL Take 1 capsule (15 mg total) by mouth at bedtime.        Signed: TZenovia Jarred11/2017, 8:34 AM

## 2015-10-26 NOTE — Progress Notes (Signed)
Pt Discharged. Discharge paper reviewed and pt understands. Prescription sent home with her. Family member escorting her.

## 2015-11-03 ENCOUNTER — Emergency Department (HOSPITAL_COMMUNITY): Payer: Medicare Other

## 2015-11-03 ENCOUNTER — Emergency Department (HOSPITAL_COMMUNITY)
Admission: EM | Admit: 2015-11-03 | Discharge: 2015-11-03 | Disposition: A | Discharge status: Home / Self Care | Payer: Medicare Other | Attending: Emergency Medicine | Admitting: Emergency Medicine

## 2015-11-03 ENCOUNTER — Encounter (HOSPITAL_COMMUNITY): Payer: Self-pay | Admitting: Emergency Medicine

## 2015-11-03 DIAGNOSIS — G8918 Other acute postprocedural pain: Secondary | ICD-10-CM | POA: Insufficient documentation

## 2015-11-03 DIAGNOSIS — R109 Unspecified abdominal pain: Secondary | ICD-10-CM | POA: Diagnosis not present

## 2015-11-03 DIAGNOSIS — I10 Essential (primary) hypertension: Secondary | ICD-10-CM | POA: Insufficient documentation

## 2015-11-03 DIAGNOSIS — Z79899 Other long term (current) drug therapy: Secondary | ICD-10-CM | POA: Diagnosis not present

## 2015-11-03 DIAGNOSIS — J449 Chronic obstructive pulmonary disease, unspecified: Secondary | ICD-10-CM | POA: Insufficient documentation

## 2015-11-03 DIAGNOSIS — J45909 Unspecified asthma, uncomplicated: Secondary | ICD-10-CM | POA: Diagnosis not present

## 2015-11-03 DIAGNOSIS — Z7982 Long term (current) use of aspirin: Secondary | ICD-10-CM | POA: Diagnosis not present

## 2015-11-03 LAB — COMPREHENSIVE METABOLIC PANEL
ALT: 13 U/L — AB (ref 14–54)
AST: 20 U/L (ref 15–41)
Albumin: 3.7 g/dL (ref 3.5–5.0)
Alkaline Phosphatase: 66 U/L (ref 38–126)
Anion gap: 7 (ref 5–15)
BUN: 13 mg/dL (ref 6–20)
CHLORIDE: 107 mmol/L (ref 101–111)
CO2: 22 mmol/L (ref 22–32)
CREATININE: 0.67 mg/dL (ref 0.44–1.00)
Calcium: 9 mg/dL (ref 8.9–10.3)
GFR calc non Af Amer: >60 mL/min (ref >60)
Glucose, Bld: 100 mg/dL — ABNORMAL HIGH (ref 65–99)
POTASSIUM: 3.7 mmol/L (ref 3.5–5.1)
SODIUM: 136 mmol/L (ref 135–145)
Total Bilirubin: 0.9 mg/dL (ref 0.3–1.2)
Total Protein: 6.7 g/dL (ref 6.5–8.1)

## 2015-11-03 LAB — CBC WITH DIFFERENTIAL/PLATELET
BASOS ABS: 0 10*3/uL (ref 0.0–0.1)
Basophils Relative: 0 %
EOS PCT: 3 %
Eosinophils Absolute: 0.2 10*3/uL (ref 0.0–0.7)
HEMATOCRIT: 35.9 % — AB (ref 36.0–46.0)
HEMOGLOBIN: 12.3 g/dL (ref 12.0–15.0)
LYMPHS ABS: 1.1 10*3/uL (ref 0.7–4.0)
LYMPHS PCT: 19 %
MCH: 31.9 pg (ref 26.0–34.0)
MCHC: 34.3 g/dL (ref 30.0–36.0)
MCV: 93.2 fL (ref 78.0–100.0)
Monocytes Absolute: 0.5 10*3/uL (ref 0.1–1.0)
Monocytes Relative: 8 %
NEUTROS ABS: 4.2 10*3/uL (ref 1.7–7.7)
NEUTROS PCT: 70 %
Platelets: 113 10*3/uL — ABNORMAL LOW (ref 150–400)
RBC: 3.85 MIL/uL — AB (ref 3.87–5.11)
RDW: 13.9 % (ref 11.5–15.5)
WBC: 5.9 10*3/uL (ref 4.0–10.5)

## 2015-11-03 LAB — LIPASE, BLOOD: LIPASE: 17 U/L (ref 11–51)

## 2015-11-03 MED ORDER — SODIUM CHLORIDE 0.9 % IV BOLUS (SEPSIS)
1000.0000 mL | Freq: Once | INTRAVENOUS | Status: AC
  Administered 2015-11-03: 1000 mL via INTRAVENOUS

## 2015-11-03 MED ORDER — DIATRIZOATE MEGLUMINE & SODIUM 66-10 % PO SOLN
ORAL | Status: AC
  Filled 2015-11-03: qty 30

## 2015-11-03 MED ORDER — ONDANSETRON HCL 4 MG/2ML IJ SOLN
4.0000 mg | Freq: Once | INTRAMUSCULAR | Status: AC
  Administered 2015-11-03: 4 mg via INTRAVENOUS
  Filled 2015-11-03: qty 2

## 2015-11-03 MED ORDER — IOPAMIDOL (ISOVUE-300) INJECTION 61%
100.0000 mL | Freq: Once | INTRAVENOUS | Status: AC | PRN
  Administered 2015-11-03: 100 mL via INTRAVENOUS

## 2015-11-03 MED ORDER — HYDROMORPHONE HCL 1 MG/ML IJ SOLN
1.0000 mg | Freq: Once | INTRAMUSCULAR | Status: AC
  Administered 2015-11-03: 1 mg via INTRAVENOUS
  Filled 2015-11-03: qty 1

## 2015-11-03 NOTE — ED Notes (Signed)
Pt and daughter report that pt has had such post op pain that it has rendered her unable to ambulate and thus she is wearing diapers to keep from walking to the bathroom. They also report last BM was yesterday. They are eduated regarding walking reducing pain as the more and more frequest she moves the movement will help both her pain and her BM, as well as her overall recovery from her surgery  She reports a wet diaper and request another - pt is changed per her and daughters request

## 2015-11-03 NOTE — ED Provider Notes (Signed)
[facility] DEPT Provider Note   CSN: [id number] Arrival date & time: 11/03/15  1227     History   Chief Complaint Chief Complaint  Patient presents with  . Post-op Problem    Pain    HPI [name] is a 65 y.o. female presenting with severe, uncontrolled abdominal pain. On 8/9 she had incisional hernia repair with mesh. Since then she was doing okay and her pain was controlled on oxycodone. She had a hard bowel movement 6 days ago, and since then has had severe worsening pain. This is 24/7. Last took her oxycodone last night. When she woke up this morning she was unable to get out of bed and had her daughter call EMS because of severe pain to the point that she could not even move. Over the last 2 days she's been nauseated. Last bowel movement was yesterday. Since the bowel movement 6 days ago she's only had 2 bowel movements. She does take Miralax intermittently. She is concerned that with her straining during a bowel movement she caused an injury to the mesh.  HPI  Past Medical History:  Diagnosis Date  . Anxiety   . Arthritis   . Asthma   . COPD (chronic obstructive pulmonary disease) ([place])   . Depression   . Diverticulitis 04/2012   [facility]  . GERD (gastroesophageal reflux disease)   . Hepatitis C    history of - received treatment  . History of pneumonia   . Hypertension   . Insomnia   . Migraine headache   . Numbness and tingling in hands   . Shortness of breath dyspnea   . Splenomegaly   . Thyroid disease    "i do not take anything for my thyroid"  . Wears glasses     Patient Active Problem List   Diagnosis Date Noted  . S/P laparoscopic hernia repair 10/24/2015  . Gastroesophageal reflux disease without esophagitis   . Cough 02/01/2014  . AP (abdominal pain) 02/01/2014  . Left-sided chest wall pain 02/01/2014  . Chronic cough 11/25/2013  . DYSPNEA 11/29/2008  . WEIGHT GAIN 10/28/2007  . ORAL APHTHAE 05/26/2007  . FRACTURE, ANKLE,  RIGHT 04/26/2007  . KNEE PAIN, RIGHT, CHRONIC 02/25/2007  . Chest pain, unspecified 02/25/2007  . ARTHRITIS, GENERALIZED 01/18/2007  . HYPOMAGNESEMIA 04/16/2006  . DERMATITIS, OTHER ATOPIC 04/16/2006  . INSOMNIA 04/16/2006  . RENAL INSUFFICIENCY, ACUTE 04/09/2006  . TRANSAMINASES, SERUM, ELEVATED 04/09/2006  . TOTAL KNEE REPLACEMENT, RIGHT, HX OF 04/09/2006  . Chronic hepatitis C virus infection ([place]) 04/08/2006  . ANEMIA-NOS 04/08/2006  . THROMBOCYTOPENIC PURPURA, 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  . ANXIETY 04/08/2006  . DEPRESSION 04/08/2006  . HYPERTENSION 04/08/2006  . GERD 04/08/2006  . DEGENERATIVE JOINT DISEASE, KNEE 04/08/2006  . OSTEOMYELITIS, ACUTE 03/24/2006    Past Surgical History:  Procedure Laterality Date  . ABDOMINAL HYSTERECTOMY    . ANKLE SURGERY Right   . COLON SURGERY     "part of color removed"  . COLONOSCOPY    . ESOPHAGEAL MANOMETRY N/A 04/10/2014   Procedure: ESOPHAGEAL MANOMETRY (EM);  Surgeon: Gatha Mayer, MD;  Location: [facility] ENDOSCOPY;  Service: Endoscopy;  Laterality: N/A;  . ESOPHAGOGASTRODUODENOSCOPY    . INCISIONAL HERNIA REPAIR N/A 10/24/2015   Procedure: LAPAROSCOPIC REPAIR INCISIONAL HERNIA WITH MESH;  Surgeon: Georganna Skeans, MD;  Location: [facility];  Service: General;  Laterality: N/A;  . INSERTION OF MESH N/A 10/24/2015   Procedure: INSERTION OF MESH;  Surgeon: Georganna Skeans, MD;  Location: [facility]  OR;  Service: General;  Laterality: N/A;  . JOINT REPLACEMENT    . KNEE SURGERY Right    x4  . [place] IMPEDANCE STUDY N/A 04/10/2014   Procedure: [place] IMPEDANCE STUDY;  Surgeon: Gatha Mayer, MD;  Location: [facility]OSCOPY;  Service: Endoscopy;  Laterality: N/A;    OB History    No data available       Home Medications    Prior to Admission medications   Medication Sig Start Date End Date Taking? Authorizing Provider  albuterol (PROVENTIL HFA;VENTOLIN HFA) 108 (90 Base) MCG/ACT inhaler Inhale 2 puffs into the lungs every 6 (six) hours as needed for wheezing or  shortness of breath.   Yes Historical Provider, MD  albuterol (PROVENTIL) (2.5 MG/3ML) 0.083% nebulizer solution Take 2.5 mg by nebulization every 6 (six) hours as needed for wheezing or shortness of breath.   Yes Historical Provider, MD  ALPRAZolam Duanne Moron) 1 MG tablet Take 1 tablet (1 mg total) by mouth 4 (four) times daily. 09/12/15  Yes Cloria Spring, MD  aspirin EC 81 MG tablet Take 81 mg by mouth daily.   Yes Historical Provider, MD  Butalbital-APAP-Caffeine 50-300-40 MG CAPS Take 1 tablet by mouth as needed (Migraine).    Yes Historical Provider, MD  celecoxib (CELEBREX) 100 MG capsule Take 100 mg by mouth 2 (two) times daily. 12/14/14  Yes Historical Provider, MD  Docusate Calcium (STOOL SOFTENER PO) Take 2 capsules by mouth daily.    Yes Historical Provider, MD  escitalopram (LEXAPRO) 20 MG tablet Take 1 tablet (20 mg total) by mouth daily. 09/12/15 09/11/16 Yes Cloria Spring, MD  loratadine (CLARITIN) 10 MG tablet TAKE ONE TABLET BY MOUTH ONCE DAILY 12/21/14  Yes Collene Gobble, MD  meloxicam (MOBIC) 15 MG tablet TAKE ONE TABLET BY MOUTH DAILY 01/03/15  Yes Gatha Mayer, MD  metoCLOPramide (REGLAN) 10 MG tablet Take 1 tablet every morning with breakfast and at bedtime 07/11/14  Yes Collene Gobble, MD  metoprolol succinate (TOPROL-XL) 50 MG 24 hr tablet Take 25-50 mg by mouth 2 (two) times daily. Take 50 mg every morning Take 25 mg at bedtime   Yes Historical Provider, MD  Multiple Vitamin (MULTI-VITAMIN PO) Take 1 tablet by mouth daily.   Yes Historical Provider, MD  nystatin (MYCOSTATIN) powder Apply 1 g topically as needed (under breast).  03/16/14  Yes Historical Provider, MD  oxyCODONE (OXY IR/ROXICODONE) 5 MG immediate release tablet Take 1-3 tablets (5-15 mg total) by mouth every 4 (four) hours as needed (61m for mild pain, 158mfor moderate pain, 1528mor severe pain). 10/26/15  Yes BurGeorganna SkeansD  polyethylene glycol powder (MIRALAX) powder Take 17 g by mouth daily. Patient taking  differently: Take 17 g by mouth as needed.  06/20/14  Yes CarGatha MayerD  ranitidine (ZANTAC) 300 MG tablet Take 300 mg by mouth at bedtime. 02/07/14  Yes Historical Provider, MD  Simethicone (GAS RELIEF PO) Take 2 capsules by mouth as needed (flatulence).    Yes Historical Provider, MD  temazepam (RESTORIL) 15 MG capsule Take 1 capsule (15 mg total) by mouth at bedtime. 09/12/15  Yes DebCloria SpringD    Family History Family History  Problem Relation Age of Onset  . Alcohol abuse Father   . Dementia Father   . Dementia Maternal Grandmother   . Bipolar disorder Daughter   . Anxiety disorder Daughter   . Depression Brother   . Drug abuse Brother   .  Alcohol abuse Paternal Uncle   . Depression Paternal Uncle   . ADD / ADHD Neg Hx     Social History Social History  Substance Use Topics  . Smoking status: Never Smoker  . Smokeless tobacco: Never Used  . Alcohol use 1.8 oz/week    3 Cans of beer per week     Comment: 2-3 drinks per week     Allergies   Levothyroxine and Tylenol [acetaminophen]   Review of Systems Review of Systems  Gastrointestinal: Positive for abdominal pain, constipation and nausea. Negative for vomiting.  All other systems reviewed and are negative.    Physical Exam Updated Vital Signs BP 119/81   Pulse 70   Temp 99.2 F (37.3 C) (Oral)   Resp 18   Ht 5' 3"  (1.6 m)   Wt 178 lb (80.7 kg)   SpO2 100%   BMI 31.53 kg/m   Physical Exam  Constitutional: She is oriented to person, place, and time. She appears well-developed and well-nourished. She appears distressed (laying in bed in obvious pain).  HENT:  Head: Normocephalic and atraumatic.  Right Ear: External ear normal.  Left Ear: External ear normal.  Nose: Nose normal.  Eyes: Right eye exhibits no discharge. Left eye exhibits no discharge.  Cardiovascular: Normal rate, regular rhythm and normal heart sounds.   Pulmonary/Chest: Effort normal and breath sounds normal.  Abdominal: Soft.  She exhibits no distension. There is tenderness (diffuse tenderness).  No rigidity. Diffuse tenderness. Multiple well healing laparoscopic surgical wounds. I do not feel an obvious hernia defect  Neurological: She is alert and oriented to person, place, and time.  Skin: Skin is warm and dry.  Nursing note and vitals reviewed.    ED Treatments / Results  Labs (all labs ordered are listed, but only abnormal results are displayed) Labs Reviewed  COMPREHENSIVE METABOLIC PANEL - Abnormal; Notable for the following:       Result Value   Glucose, Bld 100 (*)    ALT 13 (*)    All other components within normal limits  CBC WITH DIFFERENTIAL/PLATELET - Abnormal; Notable for the following:    RBC 3.85 (*)    HCT 35.9 (*)    Platelets 113 (*)    All other components within normal limits  LIPASE, BLOOD    EKG  EKG Interpretation None       Radiology Ct Abdomen Pelvis W Contrast  Result Date: 11/03/2015 CLINICAL DATA:  Abdominal pain, laparoscopic incisional hernia repair last week EXAM: CT ABDOMEN AND PELVIS WITH CONTRAST TECHNIQUE: Multidetector CT imaging of the abdomen and pelvis was performed using the standard protocol following bolus administration of intravenous contrast. CONTRAST:  165m ISOVUE-300 IOPAMIDOL (ISOVUE-300) INJECTION 61% COMPARISON:  02/02/2014 FINDINGS: Lung bases shows mild atelectasis or scarring left base anterolaterally. Borderline cardiomegaly. Mild fatty infiltration of the liver. No calcified gallstones are noted within gallbladder. Enhanced pancreas and mild atrophic fatty replaced. No focal mass. Borderline splenomegaly. The spleen measures 12.6 cm in length. No adrenal gland mass. Enhanced kidneys are symmetrical in size. No hydronephrosis or hydroureter. The urinary bladder is unremarkable. Bilateral distal ureter is unremarkable. Delayed renal images shows bilateral renal symmetrical excretion. Bilateral visualized proximal ureter is unremarkable. There is no  small bowel obstruction. Oral contrast material was given to the patient. Contrast material noted within right colon and transverse colon. No pericecal inflammation. The terminal ileum is unremarkable. Normal appendix is noted. There are postsurgical changes abdominal wall umbilical region. Axial image 47, 49 and  50 there is a anterior abdominal fluid collection under abdominal fascia. This measures 11.6 cm in length by 3.3 cm thickness. The fluid measures 6.5 Hounsfield units in attenuation consistent with simple fluid. This is consistent with postop seroma. Less likely abscess. Mild subcutaneous edema noted anterior abdominal wall. The collection is best visualized in coronal image 29 measures 8.4 cm cranial caudally by 11.8 cm transverse dimension. No definite evidence of subcutaneous abscess or fluid collection. Mild periumbilical edema is noted. Minimal skin thickening in umbilical region. The urinary bladder is unremarkable. The patient is status post hysterectomy. Atherosclerotic calcifications of abdominal aorta and iliac arteries. No aortic aneurysm. Sagittal images of the spine shows mild degenerative changes thoracolumbar spine. No destructive bony lesions are noted. IMPRESSION: 1. There are postsurgical changes abdominal wall umbilical region. Axial image 47, 49 and 50 there is a anterior abdominal fluid collection under abdominal fascia. This measures 11.6 cm in length by 3.3 cm thickness. The fluid measures 6.5 Hounsfield units in attenuation consistent with simple fluid. This is consistent with postop seroma. Less likely abscess. Mild subcutaneous edema noted anterior abdominal wall. The collection is best visualized in coronal image 29 measures 8.4 cm cranial caudally by 11.8 cm transverse dimension. 2. No hydronephrosis or hydroureter. 3. No small bowel obstruction. 4. No pericecal inflammation.  Normal appendix. 5. Borderline splenomegaly. 6. Status post hysterectomy. Electronically Signed   By:  Lahoma Crocker M.D.   On: 11/03/2015 16:35    Procedures Procedures (including critical care time)  Medications Ordered in ED Medications  sodium chloride 0.9 % bolus 1,000 mL (0 mLs Intravenous Stopped 11/03/15 1434)  ondansetron (ZOFRAN) injection 4 mg (4 mg Intravenous Given 11/03/15 1256)  HYDROmorphone (DILAUDID) injection 1 mg (1 mg Intravenous Given 11/03/15 1256)  iopamidol (ISOVUE-300) 61 % injection 100 mL (100 mLs Intravenous Contrast Given 11/03/15 1540)  HYDROmorphone (DILAUDID) injection 1 mg (1 mg Intravenous Given 11/03/15 1715)     Initial Impression / Assessment and Plan / ED Course  I have reviewed the triage vital signs and the nursing notes.  Pertinent labs & imaging results that were available during my care of the patient were reviewed by me and considered in my medical decision making (see chart for details).  Clinical Course  Comment By Time  I don't feel an obvious hernia but with her severe uncontrolled pain will give IV dilaudid, fluids, zofran and check CT to eval for post-op complications Sherwood Gambler, MD 08/19 1238  Pain is improved. Had discussion with patient about better bowel regimen. Waiting on CT and labwork Sherwood Gambler, MD 08/19 1351  CT shows likely postop seroma but no other significant findings. I highly doubt this is abscess with no fevers or illness when blood cell count. Probably she strained her abdomen with her bowel movement and has already been having postop pain since the surgery. Will give 1 more dose of Dilaudid and then discharge home. Follow-up with surgeon. Sherwood Gambler, MD 08/19 1644     Final Clinical Impressions(s) / ED Diagnoses   Final diagnoses:  Post-operative pain    New Prescriptions Discharge Medication List as of 11/03/2015  5:03 PM       Sherwood Gambler, MD 11/03/15 2046

## 2015-11-03 NOTE — ED Triage Notes (Signed)
Patient arrives via EMS from home with c/o post op abdominal pain. Patient had uncomplicated laparoscopic repair of incisional hernia with mesh done last week. Patient states pain has been ongoing since surgery. States today she feels like she can't move. Patient reports LBM yesterday and runny due to laxatives that she took for post-op constipation. Patient tearful. Given 6 mg Morphine IV by EMS PTA.

## 2015-11-03 NOTE — ED Notes (Signed)
Pt is in CT via wheelchair

## 2015-11-03 NOTE — Progress Notes (Signed)
Patient traveled to and from CT via EMS stretcher since we are on the mobile CT truck.

## 2015-11-03 NOTE — ED Notes (Signed)
Patient with no complaints at this time. Respirations even and unlabored. Skin warm/dry. Discharge instructions reviewed with patient at this time. Patient given opportunity to voice concerns/ask questions. IV removed per policy and band-aid applied to site. Patient discharged at this time and left Emergency Department with steady gait.  

## 2015-11-29 ENCOUNTER — Other Ambulatory Visit (HOSPITAL_COMMUNITY): Payer: Self-pay | Admitting: Psychiatry

## 2015-12-05 ENCOUNTER — Other Ambulatory Visit: Payer: Self-pay | Admitting: Emergency Medicine

## 2015-12-13 ENCOUNTER — Ambulatory Visit (INDEPENDENT_AMBULATORY_CARE_PROVIDER_SITE_OTHER): Payer: 59 | Admitting: Psychiatry

## 2015-12-13 ENCOUNTER — Encounter (HOSPITAL_COMMUNITY): Payer: Self-pay | Admitting: Psychiatry

## 2015-12-13 VITALS — BP 128/81 | HR 53 | Ht 63.0 in | Wt 177.2 lb

## 2015-12-13 DIAGNOSIS — F331 Major depressive disorder, recurrent, moderate: Secondary | ICD-10-CM | POA: Diagnosis not present

## 2015-12-13 MED ORDER — ALPRAZOLAM 1 MG PO TABS: 1.0000 mg | ORAL_TABLET | Freq: Four times a day (QID) | ORAL | 2 refills | Status: DC

## 2015-12-13 MED ORDER — TRAZODONE HCL 150 MG PO TABS: 150.0000 mg | ORAL_TABLET | Freq: Every day | ORAL | 2 refills | Status: DC

## 2015-12-13 MED ORDER — ESCITALOPRAM OXALATE 20 MG PO TABS: 20.0000 mg | ORAL_TABLET | Freq: Every day | ORAL | 2 refills | Status: DC

## 2015-12-13 NOTE — Progress Notes (Signed)
Patient ID: [name], female   DOB: 26-May-1950, 65 y.o.   MRN: [id number] Patient ID: [name], female   DOB: 06/24/1950, 65 y.o.   MRN: [id number] Patient ID: [name], female   DOB: [birth year], 65 y.o.   MRN: [id number] Patient ID: [name], female   DOB: Dec 16, 1950, 65 y.o.   MRN: [id number] Patient ID: [name], female   DOB: March 30, 1950, 65 y.o.   MRN: [id number] Patient ID: [name], female   DOB: 1951-02-11, 65 y.o.   MRN: [id number] Patient ID: [name], female   DOB: [birth year], 65 y.o.   MRN: [id number] Patient ID: [name], female   DOB: 01-02-1951, 65 y.o.   MRN: [id number] Patient ID: [name], female   DOB: [birth year], 65 y.o.   MRN: [id number] Patient ID: [name], female   DOB: [birth year], 65 y.o.   MRN: [id number] Patient ID: [name], female   DOB: [birth year], 65 y.o.   MRN: [id number] [facility] [id number] Progress Note [name] MRN: [id number] DOB: 05/16/1950 Age: 65 y.o.  Date: 12/13/2015 Start Time: 2:30 PM End Time: 2:45 PM  Chief Complaint: Chief Complaint  Patient presents with  . Depression  . Anxiety  . Follow-up    Subjective:  "I've been better"  This patient is a 65 year old widowed white female. She's currently living with her daughter in [place] She has 2 grown children and her son lives in [place]. She is on disability.  The patient has a long history of depression and anxiety. She generally does pretty well but she's had some bad news lately. Her primary doctors think she has hepatitis C. She went to a GI specialist today who drew a lot of labs. Her platelets are low and her spleen is enlarged but she's not sure what's going to happen from here. She does feel like her psychiatric medications have been helpful. She sleeping pretty well.  Given all these stressors she's holding up well.  The patient returns after 3 months. She has Had surgery on her abdominal hernias in August and they are healing up. She had to  rest for quite a while but for the last 2 weeks she's been up and about. She is starting to feel better. She is still losing a bit of weight and states her appetite is not that great but she needs to lose more weight. Her mood is fairly good but she's not sleeping well with the Restoril and thinks overall the trazodone worked better. I told her we could restarted at a higher dose than she took before. Allergies: Allergies  Allergen Reactions  . Levothyroxine Hives and Itching  . Tylenol [Acetaminophen] Other (See Comments)    Liver function per MD    Medical History: Past Medical History:  Diagnosis Date  . Anxiety   . Arthritis   . Asthma   . COPD (chronic obstructive pulmonary disease) ([place])   . Depression   . Diverticulitis 04/2012   [facility]  . GERD (gastroesophageal reflux disease)   . Hepatitis C    history of - received treatment  . History of pneumonia   . Hypertension   . Insomnia   . Migraine headache   . Numbness and tingling in hands   . Shortness of breath dyspnea   . Splenomegaly   . Thyroid disease    "i do not take anything for my thyroid"  . Wears glasses  Surgical History: Past Surgical History:  Procedure Laterality Date  . ABDOMINAL HYSTERECTOMY    . ANKLE SURGERY Right   . COLON SURGERY     "part of color removed"  . COLONOSCOPY    . ESOPHAGEAL MANOMETRY N/A 04/10/2014   Procedure: ESOPHAGEAL MANOMETRY (EM);  Surgeon: Gatha Mayer, MD;  Location: [facility];  Service: Endoscopy;  Laterality: N/A;  . ESOPHAGOGASTRODUODENOSCOPY    . INCISIONAL HERNIA REPAIR N/A 10/24/2015   Procedure: LAPAROSCOPIC REPAIR INCISIONAL HERNIA WITH MESH;  Surgeon: Georganna Skeans, MD;  Location: [place];  Service: General;  Laterality: N/A;  . INSERTION OF MESH N/A 10/24/2015   Procedure: INSERTION OF MESH;  Surgeon: Georganna Skeans, MD;  Location: [place];  Service: General;  Laterality: N/A;  . JOINT REPLACEMENT    . KNEE SURGERY Right    x4  . [place] IMPEDANCE  STUDY N/A 04/10/2014   Procedure: [place] IMPEDANCE STUDY;  Surgeon: Gatha Mayer, MD;  Location: [facility];  Service: Endoscopy;  Laterality: N/A;  Family History family history includes Alcohol abuse in her father and paternal uncle; Anxiety disorder in her daughter; Bipolar disorder in her daughter; Dementia in her father and maternal grandmother; Depression in her brother and paternal uncle; Drug abuse in her brother. Reviewed again with no changes noted. Diagnosis:   Axis I: Anxiety Disorder NOS and Major Depression, Recurrent severe Axis II: Deferred Axis III:  Past Medical History:  Diagnosis Date  . Anxiety   . Arthritis   . Asthma   . COPD (chronic obstructive pulmonary disease) ([place])   . Depression   . Diverticulitis 04/2012   [facility]  . GERD (gastroesophageal reflux disease)   . Hepatitis C    history of - received treatment  . History of pneumonia   . Hypertension   . Insomnia   . Migraine headache   . Numbness and tingling in hands   . Shortness of breath dyspnea   . Splenomegaly   . Thyroid disease    "i do not take anything for my thyroid"  . Wears glasses    Axis IV: other psychosocial or environmental problems Axis V: 51-60 moderate symptoms  ADL's:  Intact  Sleep: Good  Appetite:  Good  Suicidal Ideation:  Pt denies any thoughts, plans, intent of suicide Homicidal Ideation:  Pt denies any thoughts, plans, intent of homicide AEB (as evidenced by):per pt report  Psychiatric Specialty Exam: ROS ROS: Neuro: no headaches, ataxia, weakness GI: no N/V/D/cramps/constipation MS: no weakness, muscle cramps, aches, but does have joint pain from arthritis.    Blood pressure 128/81, pulse (!) 53, height 5' 3"  (1.6 m), weight 177 lb 3.2 oz (80.4 kg), SpO2 95 %.Body mass index is 31.39 kg/m.  General Appearance: Casual  Eye Contact::  Good  Speech:  Clear and Coherent  Volume:  Normal  Mood: Anxious But less depressed   Affect: Congruent    Thought Process:  Coherent, Intact and Logical  Orientation:  Full (Time, Place, and Person)  Thought Content:  WDL  Suicidal Thoughts:  No  Homicidal Thoughts:  No  Memory:  Immediate;   Fair Recent;   Fair Remote;   Fair  Judgement:  Good  Insight:  Good  Psychomotor Activity:  Normal  Concentration:  Good  Recall:  Good  Akathisia:  No  Handed:  Ambidextrous  AIMS (if indicated):     Assets:  Communication Skills Desire for Improvement  Sleep:      Current Medications:  Current Outpatient Prescriptions  Medication Sig Dispense Refill  . albuterol (PROVENTIL HFA;VENTOLIN HFA) 108 (90 Base) MCG/ACT inhaler Inhale 2 puffs into the lungs every 6 (six) hours as needed for wheezing or shortness of breath.    Marland Kitchen albuterol (PROVENTIL) (2.5 MG/3ML) 0.083% nebulizer solution Take 2.5 mg by nebulization every 6 (six) hours as needed for wheezing or shortness of breath.    . ALPRAZolam (XANAX) 1 MG tablet Take 1 tablet (1 mg total) by mouth 4 (four) times daily. 120 tablet 2  . aspirin EC 81 MG tablet Take 81 mg by mouth daily.    . Butalbital-APAP-Caffeine 50-300-40 MG CAPS Take 1 tablet by mouth as needed (Migraine).     . celecoxib (CELEBREX) 100 MG capsule Take 100 mg by mouth 2 (two) times daily.  2  . Docusate Calcium (STOOL SOFTENER PO) Take 2 capsules by mouth daily.     Marland Kitchen escitalopram (LEXAPRO) 20 MG tablet Take 1 tablet (20 mg total) by mouth daily. 30 tablet 2  . loratadine (CLARITIN) 10 MG tablet TAKE ONE TABLET BY MOUTH ONCE DAILY 30 tablet 2  . meloxicam (MOBIC) 15 MG tablet TAKE ONE TABLET BY MOUTH DAILY 30 tablet 1  . metoCLOPramide (REGLAN) 10 MG tablet Take 1 tablet every morning with breakfast and at bedtime 60 tablet 2  . metoprolol succinate (TOPROL-XL) 50 MG 24 hr tablet Take 25-50 mg by mouth 2 (two) times daily. Take 50 mg every morning Take 25 mg at bedtime    . Multiple Vitamin (MULTI-VITAMIN PO) Take 1 tablet by mouth daily.    Marland Kitchen nystatin (MYCOSTATIN) powder  Apply 1 g topically as needed (under breast).   2  . polyethylene glycol powder (MIRALAX) powder Take 17 g by mouth daily. (Patient taking differently: Take 17 g by mouth as needed. ) 255 g 0  . ranitidine (ZANTAC) 300 MG tablet Take 300 mg by mouth at bedtime.  3  . Simethicone (GAS RELIEF PO) Take 2 capsules by mouth as needed (flatulence).     . traZODone (DESYREL) 150 MG tablet Take 1 tablet (150 mg total) by mouth at bedtime. 30 tablet 2   No current facility-administered medications for this visit.   Lab Results:  Results for orders placed or performed during the hospital encounter of 11/03/15 (from the past 4032 hour(s))  Comprehensive metabolic panel   Collection Time: 11/03/15  1:05 PM  Result Value Ref Range   Sodium 136 135 - 145 mmol/L   Potassium 3.7 3.5 - 5.1 mmol/L   Chloride 107 101 - 111 mmol/L   CO2 22 22 - 32 mmol/L   Glucose, Bld 100 (H) 65 - 99 mg/dL   BUN 13 6 - 20 mg/dL   Creatinine, Ser 0.67 0.44 - 1.00 mg/dL   Calcium 9.0 8.9 - 10.3 mg/dL   Total Protein 6.7 6.5 - 8.1 g/dL   Albumin 3.7 3.5 - 5.0 g/dL   AST 20 15 - 41 U/L   ALT 13 (L) 14 - 54 U/L   Alkaline Phosphatase 66 38 - 126 U/L   Total Bilirubin 0.9 0.3 - 1.2 mg/dL   GFR calc non Af Amer >60 >60 mL/min   GFR calc Af Amer >60 >60 mL/min   Anion gap 7 5 - 15  Lipase, blood   Collection Time: 11/03/15  1:05 PM  Result Value Ref Range   Lipase 17 11 - 51 U/L  CBC with Differential   Collection Time: 11/03/15  1:05 PM  Result Value Ref Range   WBC 5.9 4.0 - 10.5 K/uL   RBC 3.85 (L) 3.87 - 5.11 MIL/uL   Hemoglobin 12.3 12.0 - 15.0 g/dL   HCT 35.9 (L) 36.0 - 46.0 %   MCV 93.2 78.0 - 100.0 fL   MCH 31.9 26.0 - 34.0 pg   MCHC 34.3 30.0 - 36.0 g/dL   RDW 13.9 11.5 - 15.5 %   Platelets 113 (L) 150 - 400 K/uL   Neutrophils Relative % 70 %   Neutro Abs 4.2 1.7 - 7.7 K/uL   Lymphocytes Relative 19 %   Lymphs Abs 1.1 0.7 - 4.0 K/uL   Monocytes Relative 8 %   Monocytes Absolute 0.5 0.1 - 1.0 K/uL    Eosinophils Relative 3 %   Eosinophils Absolute 0.2 0.0 - 0.7 K/uL   Basophils Relative 0 %   Basophils Absolute 0.0 0.0 - 0.1 K/uL  Results for orders placed or performed during the hospital encounter of 10/24/15 (from the past 4032 hour(s))  CBC   Collection Time: 10/24/15  6:48 PM  Result Value Ref Range   WBC 6.9 4.0 - 10.5 K/uL   RBC 4.11 3.87 - 5.11 MIL/uL   Hemoglobin 13.0 12.0 - 15.0 g/dL   HCT 38.4 36.0 - 46.0 %   MCV 93.4 78.0 - 100.0 fL   MCH 31.6 26.0 - 34.0 pg   MCHC 33.9 30.0 - 36.0 g/dL   RDW 14.1 11.5 - 15.5 %   Platelets 103 (L) 150 - 400 K/uL  Creatinine, serum   Collection Time: 10/24/15  6:48 PM  Result Value Ref Range   Creatinine, Ser 0.92 0.44 - 1.00 mg/dL   GFR calc non Af Amer >60 >60 mL/min   GFR calc Af Amer >60 >60 mL/min  CBC   Collection Time: 10/25/15  5:45 AM  Result Value Ref Range   WBC 8.1 4.0 - 10.5 K/uL   RBC 4.02 3.87 - 5.11 MIL/uL   Hemoglobin 12.7 12.0 - 15.0 g/dL   HCT 38.1 36.0 - 46.0 %   MCV 94.8 78.0 - 100.0 fL   MCH 31.6 26.0 - 34.0 pg   MCHC 33.3 30.0 - 36.0 g/dL   RDW 14.1 11.5 - 15.5 %   Platelets 128 (L) 150 - 400 K/uL  Basic metabolic panel   Collection Time: 10/25/15  5:45 AM  Result Value Ref Range   Sodium 133 (L) 135 - 145 mmol/L   Potassium 4.8 3.5 - 5.1 mmol/L   Chloride 104 101 - 111 mmol/L   CO2 24 22 - 32 mmol/L   Glucose, Bld 126 (H) 65 - 99 mg/dL   BUN 15 6 - 20 mg/dL   Creatinine, Ser 0.89 0.44 - 1.00 mg/dL   Calcium 9.4 8.9 - 10.3 mg/dL   GFR calc non Af Amer >60 >60 mL/min   GFR calc Af Amer >60 >60 mL/min   Anion gap 5 5 - 15  CBC   Collection Time: 10/26/15  2:28 AM  Result Value Ref Range   WBC 5.9 4.0 - 10.5 K/uL   RBC 3.56 (L) 3.87 - 5.11 MIL/uL   Hemoglobin 11.5 (L) 12.0 - 15.0 g/dL   HCT 34.1 (L) 36.0 - 46.0 %   MCV 95.8 78.0 - 100.0 fL   MCH 32.3 26.0 - 34.0 pg   MCHC 33.7 30.0 - 36.0 g/dL   RDW 14.4 11.5 - 15.5 %   Platelets 112 (L) 150 - 400 K/uL  Basic metabolic  panel   Collection  Time: 10/26/15  2:28 AM  Result Value Ref Range   Sodium 139 135 - 145 mmol/L   Potassium 4.2 3.5 - 5.1 mmol/L   Chloride 107 101 - 111 mmol/L   CO2 24 22 - 32 mmol/L   Glucose, Bld 89 65 - 99 mg/dL   BUN 15 6 - 20 mg/dL   Creatinine, Ser 0.77 0.44 - 1.00 mg/dL   Calcium 9.1 8.9 - 10.3 mg/dL   GFR calc non Af Amer >60 >60 mL/min   GFR calc Af Amer >60 >60 mL/min   Anion gap 8 5 - 15  Results for orders placed or performed during the hospital encounter of 10/18/15 (from the past 4032 hour(s))  Comprehensive metabolic panel   Collection Time: 10/18/15  9:40 AM  Result Value Ref Range   Sodium 140 135 - 145 mmol/L   Potassium 4.2 3.5 - 5.1 mmol/L   Chloride 105 101 - 111 mmol/L   CO2 27 22 - 32 mmol/L   Glucose, Bld 72 65 - 99 mg/dL   BUN 15 6 - 20 mg/dL   Creatinine, Ser 0.72 0.44 - 1.00 mg/dL   Calcium 10.4 (H) 8.9 - 10.3 mg/dL   Total Protein 6.9 6.5 - 8.1 g/dL   Albumin 4.3 3.5 - 5.0 g/dL   AST 19 15 - 41 U/L   ALT 18 14 - 54 U/L   Alkaline Phosphatase 92 38 - 126 U/L   Total Bilirubin 0.2 (L) 0.3 - 1.2 mg/dL   GFR calc non Af Amer >60 >60 mL/min   GFR calc Af Amer >60 >60 mL/min   Anion gap 8 5 - 15  CBC   Collection Time: 10/18/15  9:40 AM  Result Value Ref Range   WBC 4.6 4.0 - 10.5 K/uL   RBC 4.29 3.87 - 5.11 MIL/uL   Hemoglobin 13.7 12.0 - 15.0 g/dL   HCT 41.0 36.0 - 46.0 %   MCV 95.6 78.0 - 100.0 fL   MCH 31.9 26.0 - 34.0 pg   MCHC 33.4 30.0 - 36.0 g/dL   RDW 14.4 11.5 - 15.5 %   Platelets 102 (L) 150 - 400 K/uL   . Physical Findings: AIMS:  , ,  ,  ,    CIWA:    COWS:    Plan: I took her vitals.  I reviewed CC, tobacco/med/surg Hx, meds effects/ side effects, problem list, therapies and responses as well as current situation/symptoms discussed options. She will continue Xanax for anxiety . She will continue Lexapro 20 mg daily for depression. She will Discontinue Restoril and start trazodone 150 mg at bedtime for sleep She'll return in 3 months See  orders and pt instructions for more details. Meds ordered this encounter  Medications  . escitalopram (LEXAPRO) 20 MG tablet    Sig: Take 1 tablet (20 mg total) by mouth daily.    Dispense:  30 tablet    Refill:  2  . traZODone (DESYREL) 150 MG tablet    Sig: Take 1 tablet (150 mg total) by mouth at bedtime.    Dispense:  30 tablet    Refill:  2  . ALPRAZolam (XANAX) 1 MG tablet    Sig: Take 1 tablet (1 mg total) by mouth 4 (four) times daily.    Dispense:  120 tablet    Refill:  2  Medical Decision Making Problem Points:  Established problem, stable/improving (1), Review of last therapy session (1) and Review of  psycho-social stressors (1) Data Points:  Review of medication regiment & side effects (2) I certify that outpatient services furnished can reasonably be expected to improve the patient's condition.  ROSS, [facility] 12/13/2015, 9:56 AM

## 2016-02-06 ENCOUNTER — Telehealth (HOSPITAL_COMMUNITY): Payer: Self-pay | Admitting: *Deleted

## 2016-02-06 NOTE — Telephone Encounter (Signed)
returned phone call with appointment date and time, no answer, left voice message.

## 2016-02-25 ENCOUNTER — Other Ambulatory Visit (HOSPITAL_COMMUNITY): Payer: Self-pay | Admitting: Psychiatry

## 2016-02-26 ENCOUNTER — Ambulatory Visit (HOSPITAL_COMMUNITY): Payer: 59 | Admitting: Psychiatry

## 2016-02-28 ENCOUNTER — Telehealth (HOSPITAL_COMMUNITY): Payer: Self-pay | Admitting: *Deleted

## 2016-02-28 NOTE — Telephone Encounter (Signed)
We can give her one more chance only

## 2016-02-28 NOTE — Telephone Encounter (Signed)
Pt called stating she would like to resch her appt she missed the other day. Per pt chart, pt no showed for Dr. Harrington Challenger on 02-26-2016, 08-29-2015, 07-30-2015 and 09-15-2014. Per pt, for her last appt she over slept and missed her appt. Reminded pt of no show policy and pt verbalized understanding. Pt would like office to call her back on her home number with decision as to if office can still resch her or not. Please advise.

## 2016-03-03 NOTE — Telephone Encounter (Signed)
Called pt and informed her on 02-28-2016 with what provider stated and pt verbalized understanding.

## 2016-03-21 ENCOUNTER — Encounter (HOSPITAL_COMMUNITY): Payer: Self-pay | Admitting: Psychiatry

## 2016-03-21 ENCOUNTER — Ambulatory Visit (INDEPENDENT_AMBULATORY_CARE_PROVIDER_SITE_OTHER): Payer: 59 | Admitting: Psychiatry

## 2016-03-21 VITALS — BP 125/75 | HR 101 | Ht 63.0 in | Wt 185.6 lb

## 2016-03-21 DIAGNOSIS — Z79899 Other long term (current) drug therapy: Secondary | ICD-10-CM

## 2016-03-21 DIAGNOSIS — F419 Anxiety disorder, unspecified: Secondary | ICD-10-CM

## 2016-03-21 DIAGNOSIS — F331 Major depressive disorder, recurrent, moderate: Secondary | ICD-10-CM

## 2016-03-21 DIAGNOSIS — Z888 Allergy status to other drugs, medicaments and biological substances status: Secondary | ICD-10-CM

## 2016-03-21 MED ORDER — ESCITALOPRAM OXALATE 20 MG PO TABS: 20.0000 mg | ORAL_TABLET | Freq: Two times a day (BID) | ORAL | 2 refills | Status: DC

## 2016-03-21 MED ORDER — ALPRAZOLAM 1 MG PO TABS: 1.0000 mg | ORAL_TABLET | Freq: Four times a day (QID) | ORAL | 2 refills | Status: DC

## 2016-03-21 MED ORDER — TRAZODONE HCL 150 MG PO TABS: 150.0000 mg | ORAL_TABLET | Freq: Every day | ORAL | 2 refills | Status: DC

## 2016-03-21 NOTE — Progress Notes (Signed)
Patient ID: [name], female   DOB: September 06, 1950, 66 y.o.   MRN: [id number] Patient ID: Monique KINNAMON, female   DOB: 14-May-1950, 66 y.o.   MRN: [id number] Patient ID: [name], female   DOB: Aug 13, 1950, 66 y.o.   MRN: [id number] Patient ID: [name], female   DOB: [birth year], 66 y.o.   MRN: [id number] Patient ID: [name], female   DOB: [birth year], 66 y.o.   MRN: [id number] Patient ID: [name], female   DOB: Dec 08, 1950, 66 y.o.   MRN: [id number] Patient ID: [name], female   DOB: 1950/09/20, 66 y.o.   MRN: [id number] Patient ID: [name], female   DOB: 08/11/1950, 66 y.o.   MRN: [id number] Patient ID: [name], female   DOB: 1950/08/18, 66 y.o.   MRN: [id number] Patient ID: [name], female   DOB: March 15, 1951, 66 y.o.   MRN: [id number] Patient ID: [name], female   DOB: 1950-05-30, 66 y.o.   MRN: [id number] [facility] [id number] Progress Note [name] MRN: [id number] DOB: Feb 24, 1951 Age: 66 y.o.  Date: 03/21/2016 Start Time: 2:30 PM End Time: 2:45 PM  Chief Complaint: Chief Complaint  Patient presents with  . Depression  . Anxiety  . Follow-up    Subjective:  "I've been better"  This patient is a 66 year old widowed white female. She's currently living with her daughter in [place] She has 2 grown children and her son lives in [place]. She is on disability.  The patient has a long history of depression and anxiety. She generally does pretty well but she's had some bad news lately. Her primary doctors think she has hepatitis C. She went to a GI specialist today who drew a lot of labs. Her platelets are low and her spleen is enlarged but she's not sure what's going to happen from here. She does feel like her psychiatric medications have been helpful. She sleeping pretty well.  Given all these stressors she's holding up well.  The patient returns after 4 months. She States that she has been sick with influenza and now has bronchitis. She is having  trouble getting the money together to pay her heating bill. She's been more depressed lately and admits that she takes extra Xanax sometimes and has run out a week early. I explained that we cannot refill controlled drugs early and she'll have to try to stick to 4 mg a day and she voices understanding. She is obviously very stressed. Trazodone is helping her sleep but she doesn't think the antidepressant is working well and I suggested we increase her Lexapro to 20 mg twice a day. She denies suicidal ideation Allergies: Allergies  Allergen Reactions  . Levothyroxine Hives and Itching  . Tylenol [Acetaminophen] Other (See Comments)    Liver function per MD    Medical History: Past Medical History:  Diagnosis Date  . Anxiety   . Arthritis   . Asthma   . COPD (chronic obstructive pulmonary disease) ([place])   . Depression   . Diverticulitis 04/2012   [facility]  . GERD (gastroesophageal reflux disease)   . Hepatitis C    history of - received treatment  . History of pneumonia   . Hypertension   . Insomnia   . Migraine headache   . Numbness and tingling in hands   . Shortness of breath dyspnea   . Splenomegaly   . Thyroid disease    "i do not take  anything for my thyroid"  . Wears glasses   Surgical History: Past Surgical History:  Procedure Laterality Date  . ABDOMINAL HYSTERECTOMY    . ANKLE SURGERY Right   . COLON SURGERY     "part of color removed"  . COLONOSCOPY    . ESOPHAGEAL MANOMETRY N/A 04/10/2014   Procedure: ESOPHAGEAL MANOMETRY (EM);  Surgeon: Gatha Mayer, MD;  Location: [facility];  Service: Endoscopy;  Laterality: N/A;  . ESOPHAGOGASTRODUODENOSCOPY    . INCISIONAL HERNIA REPAIR N/A 10/24/2015   Procedure: LAPAROSCOPIC REPAIR INCISIONAL HERNIA WITH MESH;  Surgeon: Georganna Skeans, MD;  Location: [place];  Service: General;  Laterality: N/A;  . INSERTION OF MESH N/A 10/24/2015   Procedure: INSERTION OF MESH;  Surgeon: Georganna Skeans, MD;  Location: [place];   Service: General;  Laterality: N/A;  . JOINT REPLACEMENT    . KNEE SURGERY Right    x4  . [place] IMPEDANCE STUDY N/A 04/10/2014   Procedure: [place] IMPEDANCE STUDY;  Surgeon: Gatha Mayer, MD;  Location: [facility];  Service: Endoscopy;  Laterality: N/A;  Family History family history includes Alcohol abuse in her father and paternal uncle; Anxiety disorder in her daughter; Bipolar disorder in her daughter; Dementia in her father and maternal grandmother; Depression in her brother and paternal uncle; Drug abuse in her brother. Reviewed again with no changes noted. Diagnosis:   Axis I: Anxiety Disorder NOS and Major Depression, Recurrent severe Axis II: Deferred Axis III:  Past Medical History:  Diagnosis Date  . Anxiety   . Arthritis   . Asthma   . COPD (chronic obstructive pulmonary disease) ([place])   . Depression   . Diverticulitis 04/2012   [facility]  . GERD (gastroesophageal reflux disease)   . Hepatitis C    history of - received treatment  . History of pneumonia   . Hypertension   . Insomnia   . Migraine headache   . Numbness and tingling in hands   . Shortness of breath dyspnea   . Splenomegaly   . Thyroid disease    "i do not take anything for my thyroid"  . Wears glasses    Axis IV: other psychosocial or environmental problems Axis V: 51-60 moderate symptoms  ADL's:  Intact  Sleep: Good  Appetite:  Good  Suicidal Ideation:  Pt denies any thoughts, plans, intent of suicide Homicidal Ideation:  Pt denies any thoughts, plans, intent of homicide AEB (as evidenced by):per pt report  Psychiatric Specialty Exam: ROS ROS: Neuro: no headaches, ataxia, weakness GI: no N/V/D/cramps/constipation MS: no weakness, muscle cramps, aches, but does have joint pain from arthritis.    Blood pressure 125/75, pulse (!) 101, height 5' 3"  (1.6 m), weight 185 lb 9.6 oz (84.2 kg), SpO2 96 %.Body mass index is 32.88 kg/m.  General Appearance: Casual wearing a mask due  to illness   Eye Contact::  Good  Speech:  Clear and Coherent  Volume:  Normal  Mood: Anxious And depressed   Affect:Constricted   Thought Process:  Coherent, Intact and Logical  Orientation:  Full (Time, Place, and Person)  Thought Content:  WDL  Suicidal Thoughts:  No  Homicidal Thoughts:  No  Memory:  Immediate;   Fair Recent;   Fair Remote;   Fair  Judgement:  Good  Insight:  Good  Psychomotor Activity:  Normal  Concentration:  Good  Recall:  Good  Akathisia:  No  Handed:  Ambidextrous  AIMS (if indicated):     Assets:  Communication Skills Desire for Improvement  Sleep:      Current Medications: Current Outpatient Prescriptions  Medication Sig Dispense Refill  . albuterol (PROVENTIL HFA;VENTOLIN HFA) 108 (90 Base) MCG/ACT inhaler Inhale 2 puffs into the lungs every 6 (six) hours as needed for wheezing or shortness of breath.    Marland Kitchen albuterol (PROVENTIL) (2.5 MG/3ML) 0.083% nebulizer solution Take 2.5 mg by nebulization every 6 (six) hours as needed for wheezing or shortness of breath.    . ALPRAZolam (XANAX) 1 MG tablet Take 1 tablet (1 mg total) by mouth 4 (four) times daily. 120 tablet 2  . aspirin EC 81 MG tablet Take 81 mg by mouth daily.    . Butalbital-APAP-Caffeine 50-300-40 MG CAPS Take 1 tablet by mouth as needed (Migraine).     . celecoxib (CELEBREX) 100 MG capsule Take 100 mg by mouth 2 (two) times daily.  2  . Docusate Calcium (STOOL SOFTENER PO) Take 2 capsules by mouth daily.     Marland Kitchen escitalopram (LEXAPRO) 20 MG tablet Take 1 tablet (20 mg total) by mouth 2 (two) times daily. 60 tablet 2  . loratadine (CLARITIN) 10 MG tablet TAKE ONE TABLET BY MOUTH ONCE DAILY 30 tablet 2  . meloxicam (MOBIC) 15 MG tablet TAKE ONE TABLET BY MOUTH DAILY 30 tablet 1  . metoCLOPramide (REGLAN) 10 MG tablet Take 1 tablet every morning with breakfast and at bedtime 60 tablet 2  . metoprolol succinate (TOPROL-XL) 50 MG 24 hr tablet Take 25-50 mg by mouth 2 (two) times daily. Take 50  mg every morning Take 25 mg at bedtime    . Multiple Vitamin (MULTI-VITAMIN PO) Take 1 tablet by mouth daily.    Marland Kitchen nystatin (MYCOSTATIN) powder Apply 1 g topically as needed (under breast).   2  . polyethylene glycol powder (MIRALAX) powder Take 17 g by mouth daily. (Patient taking differently: Take 17 g by mouth as needed. ) 255 g 0  . ranitidine (ZANTAC) 300 MG tablet Take 300 mg by mouth at bedtime.  3  . Simethicone (GAS RELIEF PO) Take 2 capsules by mouth as needed (flatulence).     . traZODone (DESYREL) 150 MG tablet Take 1 tablet (150 mg total) by mouth at bedtime. 30 tablet 2   No current facility-administered medications for this visit.   Lab Results:  Results for orders placed or performed during the hospital encounter of 11/03/15 (from the past 4032 hour(s))  Comprehensive metabolic panel   Collection Time: 11/03/15  1:05 PM  Result Value Ref Range   Sodium 136 135 - 145 mmol/L   Potassium 3.7 3.5 - 5.1 mmol/L   Chloride 107 101 - 111 mmol/L   CO2 22 22 - 32 mmol/L   Glucose, Bld 100 (H) 65 - 99 mg/dL   BUN 13 6 - 20 mg/dL   Creatinine, Ser 0.67 0.44 - 1.00 mg/dL   Calcium 9.0 8.9 - 10.3 mg/dL   Total Protein 6.7 6.5 - 8.1 g/dL   Albumin 3.7 3.5 - 5.0 g/dL   AST 20 15 - 41 U/L   ALT 13 (L) 14 - 54 U/L   Alkaline Phosphatase 66 38 - 126 U/L   Total Bilirubin 0.9 0.3 - 1.2 mg/dL   GFR calc non Af Amer >60 >60 mL/min   GFR calc Af Amer >60 >60 mL/min   Anion gap 7 5 - 15  Lipase, blood   Collection Time: 11/03/15  1:05 PM  Result Value Ref Range   Lipase 17  11 - 51 U/L  CBC with Differential   Collection Time: 11/03/15  1:05 PM  Result Value Ref Range   WBC 5.9 4.0 - 10.5 K/uL   RBC 3.85 (L) 3.87 - 5.11 MIL/uL   Hemoglobin 12.3 12.0 - 15.0 g/dL   HCT 35.9 (L) 36.0 - 46.0 %   MCV 93.2 78.0 - 100.0 fL   MCH 31.9 26.0 - 34.0 pg   MCHC 34.3 30.0 - 36.0 g/dL   RDW 13.9 11.5 - 15.5 %   Platelets 113 (L) 150 - 400 K/uL   Neutrophils Relative % 70 %   Neutro Abs 4.2  1.7 - 7.7 K/uL   Lymphocytes Relative 19 %   Lymphs Abs 1.1 0.7 - 4.0 K/uL   Monocytes Relative 8 %   Monocytes Absolute 0.5 0.1 - 1.0 K/uL   Eosinophils Relative 3 %   Eosinophils Absolute 0.2 0.0 - 0.7 K/uL   Basophils Relative 0 %   Basophils Absolute 0.0 0.0 - 0.1 K/uL  Results for orders placed or performed during the hospital encounter of 10/24/15 (from the past 4032 hour(s))  CBC   Collection Time: 10/24/15  6:48 PM  Result Value Ref Range   WBC 6.9 4.0 - 10.5 K/uL   RBC 4.11 3.87 - 5.11 MIL/uL   Hemoglobin 13.0 12.0 - 15.0 g/dL   HCT 38.4 36.0 - 46.0 %   MCV 93.4 78.0 - 100.0 fL   MCH 31.6 26.0 - 34.0 pg   MCHC 33.9 30.0 - 36.0 g/dL   RDW 14.1 11.5 - 15.5 %   Platelets 103 (L) 150 - 400 K/uL  Creatinine, serum   Collection Time: 10/24/15  6:48 PM  Result Value Ref Range   Creatinine, Ser 0.92 0.44 - 1.00 mg/dL   GFR calc non Af Amer >60 >60 mL/min   GFR calc Af Amer >60 >60 mL/min  CBC   Collection Time: 10/25/15  5:45 AM  Result Value Ref Range   WBC 8.1 4.0 - 10.5 K/uL   RBC 4.02 3.87 - 5.11 MIL/uL   Hemoglobin 12.7 12.0 - 15.0 g/dL   HCT 38.1 36.0 - 46.0 %   MCV 94.8 78.0 - 100.0 fL   MCH 31.6 26.0 - 34.0 pg   MCHC 33.3 30.0 - 36.0 g/dL   RDW 14.1 11.5 - 15.5 %   Platelets 128 (L) 150 - 400 K/uL  Basic metabolic panel   Collection Time: 10/25/15  5:45 AM  Result Value Ref Range   Sodium 133 (L) 135 - 145 mmol/L   Potassium 4.8 3.5 - 5.1 mmol/L   Chloride 104 101 - 111 mmol/L   CO2 24 22 - 32 mmol/L   Glucose, Bld 126 (H) 65 - 99 mg/dL   BUN 15 6 - 20 mg/dL   Creatinine, Ser 0.89 0.44 - 1.00 mg/dL   Calcium 9.4 8.9 - 10.3 mg/dL   GFR calc non Af Amer >60 >60 mL/min   GFR calc Af Amer >60 >60 mL/min   Anion gap 5 5 - 15  CBC   Collection Time: 10/26/15  2:28 AM  Result Value Ref Range   WBC 5.9 4.0 - 10.5 K/uL   RBC 3.56 (L) 3.87 - 5.11 MIL/uL   Hemoglobin 11.5 (L) 12.0 - 15.0 g/dL   HCT 34.1 (L) 36.0 - 46.0 %   MCV 95.8 78.0 - 100.0 fL   MCH  32.3 26.0 - 34.0 pg   MCHC 33.7 30.0 - 36.0 g/dL  RDW 14.4 11.5 - 15.5 %   Platelets 112 (L) 150 - 400 K/uL  Basic metabolic panel   Collection Time: 10/26/15  2:28 AM  Result Value Ref Range   Sodium 139 135 - 145 mmol/L   Potassium 4.2 3.5 - 5.1 mmol/L   Chloride 107 101 - 111 mmol/L   CO2 24 22 - 32 mmol/L   Glucose, Bld 89 65 - 99 mg/dL   BUN 15 6 - 20 mg/dL   Creatinine, Ser 0.77 0.44 - 1.00 mg/dL   Calcium 9.1 8.9 - 10.3 mg/dL   GFR calc non Af Amer >60 >60 mL/min   GFR calc Af Amer >60 >60 mL/min   Anion gap 8 5 - 15  Results for orders placed or performed during the hospital encounter of 10/18/15 (from the past 4032 hour(s))  Comprehensive metabolic panel   Collection Time: 10/18/15  9:40 AM  Result Value Ref Range   Sodium 140 135 - 145 mmol/L   Potassium 4.2 3.5 - 5.1 mmol/L   Chloride 105 101 - 111 mmol/L   CO2 27 22 - 32 mmol/L   Glucose, Bld 72 65 - 99 mg/dL   BUN 15 6 - 20 mg/dL   Creatinine, Ser 0.72 0.44 - 1.00 mg/dL   Calcium 10.4 (H) 8.9 - 10.3 mg/dL   Total Protein 6.9 6.5 - 8.1 g/dL   Albumin 4.3 3.5 - 5.0 g/dL   AST 19 15 - 41 U/L   ALT 18 14 - 54 U/L   Alkaline Phosphatase 92 38 - 126 U/L   Total Bilirubin 0.2 (L) 0.3 - 1.2 mg/dL   GFR calc non Af Amer >60 >60 mL/min   GFR calc Af Amer >60 >60 mL/min   Anion gap 8 5 - 15  CBC   Collection Time: 10/18/15  9:40 AM  Result Value Ref Range   WBC 4.6 4.0 - 10.5 K/uL   RBC 4.29 3.87 - 5.11 MIL/uL   Hemoglobin 13.7 12.0 - 15.0 g/dL   HCT 41.0 36.0 - 46.0 %   MCV 95.6 78.0 - 100.0 fL   MCH 31.9 26.0 - 34.0 pg   MCHC 33.4 30.0 - 36.0 g/dL   RDW 14.4 11.5 - 15.5 %   Platelets 102 (L) 150 - 400 K/uL   . Physical Findings: AIMS:  , ,  ,  ,    CIWA:    COWS:    Plan: I took her vitals.  I reviewed CC, tobacco/med/surg Hx, meds effects/ side effects, problem list, therapies and responses as well as current situation/symptoms discussed options. She will continue Xanax for anxiety .He was reminded to  take no more than 1 mg 4 times a day She will continue Lexapro but increase the dose to 20 g twice a day for depression. She will continue  trazodone 150 mg at bedtime for sleep She'll return in 2 months See orders and pt instructions for more details. Meds ordered this encounter  Medications  . traZODone (DESYREL) 150 MG tablet    Sig: Take 1 tablet (150 mg total) by mouth at bedtime.    Dispense:  30 tablet    Refill:  2  . escitalopram (LEXAPRO) 20 MG tablet    Sig: Take 1 tablet (20 mg total) by mouth 2 (two) times daily.    Dispense:  60 tablet    Refill:  2  . ALPRAZolam (XANAX) 1 MG tablet    Sig: Take 1 tablet (1 mg total) by  mouth 4 (four) times daily.    Dispense:  120 tablet    Refill:  2  Medical Decision Making Problem Points:  Established problem, stable/improving (1), Review of last therapy session (1) and Review of psycho-social stressors (1) Data Points:  Review of medication regiment & side effects (2) I certify that outpatient services furnished can reasonably be expected to improve the patient's condition.  Aydien Majette, [facility] 03/21/2016, 10:31 AM

## 2016-05-19 ENCOUNTER — Ambulatory Visit (INDEPENDENT_AMBULATORY_CARE_PROVIDER_SITE_OTHER): Payer: 59 | Admitting: Psychiatry

## 2016-05-19 ENCOUNTER — Encounter (HOSPITAL_COMMUNITY): Payer: Self-pay | Admitting: Psychiatry

## 2016-05-19 VITALS — BP 136/88 | HR 79 | Ht 63.0 in | Wt 186.6 lb

## 2016-05-19 DIAGNOSIS — F419 Anxiety disorder, unspecified: Secondary | ICD-10-CM

## 2016-05-19 DIAGNOSIS — Z7982 Long term (current) use of aspirin: Secondary | ICD-10-CM | POA: Diagnosis not present

## 2016-05-19 DIAGNOSIS — Z810 Family history of intellectual disabilities: Secondary | ICD-10-CM

## 2016-05-19 DIAGNOSIS — Z811 Family history of alcohol abuse and dependence: Secondary | ICD-10-CM

## 2016-05-19 DIAGNOSIS — Z79899 Other long term (current) drug therapy: Secondary | ICD-10-CM

## 2016-05-19 DIAGNOSIS — Z813 Family history of other psychoactive substance abuse and dependence: Secondary | ICD-10-CM | POA: Diagnosis not present

## 2016-05-19 DIAGNOSIS — F331 Major depressive disorder, recurrent, moderate: Secondary | ICD-10-CM | POA: Diagnosis not present

## 2016-05-19 DIAGNOSIS — Z818 Family history of other mental and behavioral disorders: Secondary | ICD-10-CM

## 2016-05-19 MED ORDER — TRAZODONE HCL 150 MG PO TABS: 150.0000 mg | ORAL_TABLET | Freq: Every day | ORAL | 2 refills | Status: DC

## 2016-05-19 MED ORDER — ALPRAZOLAM 1 MG PO TABS: 1.0000 mg | ORAL_TABLET | Freq: Four times a day (QID) | ORAL | 2 refills | Status: DC

## 2016-05-19 MED ORDER — ESCITALOPRAM OXALATE 20 MG PO TABS: 20.0000 mg | ORAL_TABLET | Freq: Two times a day (BID) | ORAL | 2 refills | Status: DC

## 2016-05-19 NOTE — Progress Notes (Signed)
Patient ID: [name], female   DOB: 1950-12-15, 66 y.o.   MRN: [id number] Patient ID: [name], female   DOB: 1951-03-15, 66 y.o.   MRN: [id number] Patient ID: [name], female   DOB: 08-07-1950, 66 y.o.   MRN: [id number] Patient ID: [name], female   DOB: [birth year], 66 y.o.   MRN: [id number] Patient ID: [name], female   DOB: [birth year], 66 y.o.   MRN: [id number] Patient ID: TENEA [name], female   DOB: 10-17-1950, 66 y.o.   MRN: [id number] Patient ID: [name], female   DOB: Nov 28, 1950, 66 y.o.   MRN: [id number] Patient ID: [name], female   DOB: 1951/01/10, 66 y.o.   MRN: [id number] Patient ID: [name], female   DOB: 1950/04/19, 66 y.o.   MRN: [id number] Patient ID: [name], female   DOB: Mar 18, 1950, 66 y.o.   MRN: [id number] Patient ID: [name], female   DOB: 12-20-1950, 66 y.o.   MRN: [id number] [facility] [id number] Progress Note [name] MRN: [id number] DOB: 01/19/1951 Age: 66 y.o.  Date: 05/19/2016 Start Time: 2:30 PM End Time: 2:45 PM  Chief Complaint: Chief Complaint  Patient presents with  . Depression  . Anxiety  . Follow-up    Subjective:  "I'm doing okay"  This patient is a 66 year old widowed white female. She's currently living with her daughter in [place] She has 2 grown children and her son lives in [place]. She is on disability.  The patient has a long history of depression and anxiety. She generally does pretty well but she's had some bad news lately. Her primary doctors think she has hepatitis C. She went to a GI specialist today who drew a lot of labs. Her platelets are low and her spleen is enlarged but she's not sure what's going to happen from here. She does feel like her psychiatric medications have been helpful. She sleeping pretty well.  Given all these stressors she's holding up well.  The patient returns after 2 months. She states that she was hospitalized at [facility] 2 days last month for the flu but  she's feeling better now. The increase in Lexapro is helped her mood. She's thinking about trying to go back to work because she is not getting enough Social Security money to meet all her utility bills. She sleeping fairly well and her energy seems to be getting better Allergies: Allergies  Allergen Reactions  . Levothyroxine Hives and Itching  . Tylenol [Acetaminophen] Other (See Comments)    Liver function per MD    Medical History: Past Medical History:  Diagnosis Date  . Anxiety   . Arthritis   . Asthma   . COPD (chronic obstructive pulmonary disease) ([place])   . Depression   . Diverticulitis 04/2012   [facility]  . GERD (gastroesophageal reflux disease)   . Hepatitis C    history of - received treatment  . History of pneumonia   . Hypertension   . Insomnia   . Migraine headache   . Numbness and tingling in hands   . Shortness of breath dyspnea   . Splenomegaly   . Thyroid disease    "i do not take anything for my thyroid"  . Wears glasses   Surgical History: Past Surgical History:  Procedure Laterality Date  . ABDOMINAL HYSTERECTOMY    . ANKLE SURGERY Right   . COLON SURGERY     "part of color  removed"  . COLONOSCOPY    . ESOPHAGEAL MANOMETRY N/A 04/10/2014   Procedure: ESOPHAGEAL MANOMETRY (EM);  Surgeon: Gatha Mayer, MD;  [facility];  Service: Endoscopy;  Laterality: N/A;  . ESOPHAGOGASTRODUODENOSCOPY    . INCISIONAL HERNIA REPAIR N/A 10/24/2015   Procedure: LAPAROSCOPIC REPAIR INCISIONAL HERNIA WITH MESH;  Surgeon: Georganna Skeans, MD;  Location: [place];  Service: General;  Laterality: N/A;  . INSERTION OF MESH N/A 10/24/2015   Procedure: INSERTION OF MESH;  Surgeon: Georganna Skeans, MD;  Location: [place];  Service: General;  Laterality: N/A;  . JOINT REPLACEMENT    . KNEE SURGERY Right    x4  . [place] IMPEDANCE STUDY N/A 04/10/2014   Procedure: [place] IMPEDANCE STUDY;  Surgeon: Gatha Mayer, MD;  [facility];  Service: Endoscopy;   Laterality: N/A;  Family History family history includes Alcohol abuse in her father and paternal uncle; Anxiety disorder in her daughter; Bipolar disorder in her daughter; Dementia in her father and maternal grandmother; Depression in her brother and paternal uncle; Drug abuse in her brother. Reviewed again with no changes noted. Diagnosis:   Axis I: Anxiety Disorder NOS and Major Depression, Recurrent severe Axis II: Deferred Axis III:  Past Medical History:  Diagnosis Date  . Anxiety   . Arthritis   . Asthma   . COPD (chronic obstructive pulmonary disease) ([place])   . Depression   . Diverticulitis 04/2012   [facility]  . GERD (gastroesophageal reflux disease)   . Hepatitis C    history of - received treatment  . History of pneumonia   . Hypertension   . Insomnia   . Migraine headache   . Numbness and tingling in hands   . Shortness of breath dyspnea   . Splenomegaly   . Thyroid disease    "i do not take anything for my thyroid"  . Wears glasses    Axis IV: other psychosocial or environmental problems Axis V: 51-60 moderate symptoms  ADL's:  Intact  Sleep: Good  Appetite:  Good  Suicidal Ideation:  Pt denies any thoughts, plans, intent of suicide Homicidal Ideation:  Pt denies any thoughts, plans, intent of homicide AEB (as evidenced by):per pt report  Psychiatric Specialty Exam: ROS ROS: Neuro: no headaches, ataxia, weakness GI: no N/V/D/cramps/constipation MS: no weakness, muscle cramps, aches, but does have joint pain from arthritis.    Blood pressure 136/88, pulse 79, height 5' 3"  (1.6 m), weight 186 lb 9.6 oz (84.6 kg), SpO2 96 %.Body mass index is 33.05 kg/m.  General Appearance: Casual   Eye Contact::  Good  Speech:  Clear and Coherent  Volume:  Normal  Mood: Good   Affect:Brighter   Thought Process:  Coherent, Intact and Logical  Orientation:  Full (Time, Place, and Person)  Thought Content:  WDL  Suicidal Thoughts:  No  Homicidal  Thoughts:  No  Memory:  Immediate;   Fair Recent;   Fair Remote;   Fair  Judgement:  Good  Insight:  Good  Psychomotor Activity:  Normal  Concentration:  Good  Recall:  Good  Akathisia:  No  Handed:  Ambidextrous  AIMS (if indicated):     Assets:  Communication Skills Desire for Improvement  Sleep:      Current Medications: Current Outpatient Prescriptions  Medication Sig Dispense Refill  . albuterol (PROVENTIL HFA;VENTOLIN HFA) 108 (90 Base) MCG/ACT inhaler Inhale 2 puffs into the lungs every 6 (six) hours as needed for wheezing or shortness of  breath.    Marland Kitchen albuterol (PROVENTIL) (2.5 MG/3ML) 0.083% nebulizer solution Take 2.5 mg by nebulization every 6 (six) hours as needed for wheezing or shortness of breath.    . ALPRAZolam (XANAX) 1 MG tablet Take 1 tablet (1 mg total) by mouth 4 (four) times daily. 120 tablet 2  . aspirin EC 81 MG tablet Take 81 mg by mouth daily.    . Butalbital-APAP-Caffeine 50-300-40 MG CAPS Take 1 tablet by mouth as needed (Migraine).     . celecoxib (CELEBREX) 100 MG capsule Take 100 mg by mouth 2 (two) times daily.  2  . Docusate Calcium (STOOL SOFTENER PO) Take 2 capsules by mouth daily.     Marland Kitchen escitalopram (LEXAPRO) 20 MG tablet Take 1 tablet (20 mg total) by mouth 2 (two) times daily. 60 tablet 2  . loratadine (CLARITIN) 10 MG tablet TAKE ONE TABLET BY MOUTH ONCE DAILY 30 tablet 2  . meloxicam (MOBIC) 15 MG tablet TAKE ONE TABLET BY MOUTH DAILY 30 tablet 1  . metoCLOPramide (REGLAN) 10 MG tablet Take 1 tablet every morning with breakfast and at bedtime 60 tablet 2  . metoprolol succinate (TOPROL-XL) 50 MG 24 hr tablet Take 25-50 mg by mouth 2 (two) times daily. Take 50 mg every morning Take 25 mg at bedtime    . Multiple Vitamin (MULTI-VITAMIN PO) Take 1 tablet by mouth daily.    Marland Kitchen nystatin (MYCOSTATIN) powder Apply 1 g topically as needed (under breast).   2  . polyethylene glycol powder (MIRALAX) powder Take 17 g by mouth daily. (Patient taking  differently: Take 17 g by mouth as needed. ) 255 g 0  . ranitidine (ZANTAC) 300 MG tablet Take 300 mg by mouth at bedtime.  3  . Simethicone (GAS RELIEF PO) Take 2 capsules by mouth as needed (flatulence).     . traZODone (DESYREL) 150 MG tablet Take 1 tablet (150 mg total) by mouth at bedtime. 30 tablet 2   No current facility-administered medications for this visit.   Lab Results:  No results found for this or any previous visit (from the past 4032 hour(s)). Marland Kitchen Physical Findings: AIMS:  , ,  ,  ,    CIWA:    COWS:    Plan: I took her vitals.  I reviewed CC, tobacco/med/surg Hx, meds effects/ side effects, problem list, therapies and responses as well as current situation/symptoms discussed options. She will continue Xanax for anxiety .He was reminded to take no more than 1 mg 4 times a day She will continue Lexapro  20 mg twice a day for depression. She will continue  trazodone 150 mg at bedtime for sleep She'll return in 3 months See orders and pt instructions for more details. Meds ordered this encounter  Medications  . escitalopram (LEXAPRO) 20 MG tablet    Sig: Take 1 tablet (20 mg total) by mouth 2 (two) times daily.    Dispense:  60 tablet    Refill:  2  . traZODone (DESYREL) 150 MG tablet    Sig: Take 1 tablet (150 mg total) by mouth at bedtime.    Dispense:  30 tablet    Refill:  2  . ALPRAZolam (XANAX) 1 MG tablet    Sig: Take 1 tablet (1 mg total) by mouth 4 (four) times daily.    Dispense:  120 tablet    Refill:  2  Medical Decision Making Problem Points:  Established problem, stable/improving (1), Review of last therapy session (1) and Review  of psycho-social stressors (1) Data Points:  Review of medication regiment & side effects (2) I certify that outpatient services furnished can reasonably be expected to improve the patient's condition.  ROSS, [facility] 05/19/2016, 10:03 AM

## 2016-05-21 ENCOUNTER — Telehealth (HOSPITAL_COMMUNITY): Payer: Self-pay | Admitting: *Deleted

## 2016-05-21 MED ORDER — ALPRAZOLAM 1 MG PO TABS: 1.0000 mg | ORAL_TABLET | Freq: Four times a day (QID) | ORAL | 2 refills | Status: DC

## 2016-05-21 NOTE — Telephone Encounter (Signed)
Called in pt Xanax to pharmacy and spoke with [facility]. Pt is aware

## 2016-05-21 NOTE — Telephone Encounter (Signed)
Per provider to call pt pharmacy and call in 30 days of pt Xanax with 2 refill. Called pt pharmacy and spoke with [facility]. Informed Stevie with what provider stated and she verbalized understanding. Called pt and informed her that script was called in. Pt verbalized understating.

## 2016-05-21 NOTE — Telephone Encounter (Signed)
Pt called stating she lost her printed script for her Xanax on the bus. Per pt, she filed one time and the refill script is what she lost. Per pt she called the bus hub to see if the driver found her script and they told her no. Asked pt if she filed a police report about her lost script and she stated no. Per pt she took the first script to the pharmacy. Pt number is [phone]. Per she would like to know if Dr. Harrington Challenger can print up another script for her. Pt last appt with provider was March 5th. Asked pt if she checked with the pharmacy and she stated they told her they did not have Xanax refill on file.

## 2016-05-21 NOTE — Telephone Encounter (Signed)
Per [facility] substance reporting, she last filled it on 04/25/16. You may call in 30 days with 2 refills

## 2016-05-22 ENCOUNTER — Telehealth (HOSPITAL_COMMUNITY): Payer: Self-pay | Admitting: *Deleted

## 2016-05-22 NOTE — Telephone Encounter (Signed)
Per Dr. Harrington Challenger to call in pt Xanax to her pharmacy. Called in script on 05-22-2016 to pt pharmacy. Office called pt to inform her and she verbalized understanding.

## 2016-05-22 NOTE — Telephone Encounter (Signed)
voice message from patient, said she lost her prescription for Xanax.   She said she need another prescription.

## 2016-08-19 ENCOUNTER — Encounter (HOSPITAL_COMMUNITY): Payer: Self-pay | Admitting: Psychiatry

## 2016-08-19 ENCOUNTER — Ambulatory Visit (INDEPENDENT_AMBULATORY_CARE_PROVIDER_SITE_OTHER): Payer: 59 | Admitting: Psychiatry

## 2016-08-19 VITALS — BP 135/91 | HR 90 | Ht 63.0 in | Wt 195.4 lb

## 2016-08-19 DIAGNOSIS — F331 Major depressive disorder, recurrent, moderate: Secondary | ICD-10-CM

## 2016-08-19 MED ORDER — TRAZODONE HCL 150 MG PO TABS: 150.0000 mg | ORAL_TABLET | Freq: Every day | ORAL | 2 refills | Status: DC

## 2016-08-19 MED ORDER — ALPRAZOLAM 1 MG PO TABS: 1.0000 mg | ORAL_TABLET | Freq: Four times a day (QID) | ORAL | 2 refills | Status: DC

## 2016-08-19 MED ORDER — ESCITALOPRAM OXALATE 20 MG PO TABS: 20.0000 mg | ORAL_TABLET | Freq: Two times a day (BID) | ORAL | 2 refills | Status: DC

## 2016-08-19 NOTE — Progress Notes (Signed)
Patient ID: [name], female   DOB: [birth year], 66 y.o.   MRN: [id number] Patient ID: [name], female   DOB: 12/07/1950, 66 y.o.   MRN: [id number] Patient ID: [name], female   DOB: Feb 06, 1951, 66 y.o.   MRN: [id number] Patient ID: [name], female   DOB: 28-Jun-1950, 66 y.o.   MRN: [id number] Patient ID: [name], female   DOB: [birth year], 66 y.o.   MRN: [id number] Patient ID: [name], female   DOB: Oct 02, 1950, 66 y.o.   MRN: [id number] Patient ID: [name], female   DOB: 09/11/1950, 66 y.o.   MRN: [id number] Patient ID: [name], female   DOB: Dec 09, 1950, 66 y.o.   MRN: [id number] Patient ID: [name], female   DOB: 1951-02-17, 66 y.o.   MRN: [id number] Patient ID: [name], female   DOB: 22-May-1950, 66 y.o.   MRN: [id number] Patient ID: AMANEE [name], female   DOB: 11-26-1950, 66 y.o.   MRN: [id number] [facility] [address] Progress Note [name] MRN: [id number] DOB: 22-Feb-1951 Age: 66 y.o.  Date: 08/19/2016 Start Time: 2:30 PM End Time: 2:45 PM  Chief Complaint: Chief Complaint  Patient presents with  . Anxiety  . Depression  . Follow-up    Subjective:  "I'm doing okay"  This patient is a 66 year old widowed white female. She's currently living with her daughter in [place] She has 2 grown children and her son lives in [place]. She is on disability.  The patient has a long history of depression and anxiety. She generally does pretty well but she's had some bad news lately. Her primary doctors think she has hepatitis C. She went to a GI specialist today who drew a lot of labs. Her platelets are low and her spleen is enlarged but she's not sure what's going to happen from here. She does feel like her psychiatric medications have been helpful. She sleeping pretty well.  Given all these stressors she's holding up well.  The patient returns after 3 months. For the most part she is doing well. Her mood is good and she is trying to get out more with  friends. She is hoping her daughter will get disability and be able to move out on her own. She would like to have her own space. She is sleeping well and really doesn't have any specific complaints today Allergies: Allergies  Allergen Reactions  . Levothyroxine Hives and Itching  . Tylenol [Acetaminophen] Other (See Comments)    Liver function per MD    Medical History: Past Medical History:  Diagnosis Date  . Anxiety   . Arthritis   . Asthma   . COPD (chronic obstructive pulmonary disease) ([place])   . Depression   . Diverticulitis 04/2012   [facility]  . GERD (gastroesophageal reflux disease)   . Hepatitis C    history of - received treatment  . History of pneumonia   . Hypertension   . Insomnia   . Migraine headache   . Numbness and tingling in hands   . Shortness of breath dyspnea   . Splenomegaly   . Thyroid disease    "i do not take anything for my thyroid"  . Wears glasses   Surgical History: Past Surgical History:  Procedure Laterality Date  . ABDOMINAL HYSTERECTOMY    . ANKLE SURGERY Right   . COLON SURGERY     "part of color removed"  . COLONOSCOPY    .  ESOPHAGEAL MANOMETRY N/A 04/10/2014   Procedure: ESOPHAGEAL MANOMETRY (EM);  Surgeon: Gatha Mayer, MD;  [facility];  Service: Endoscopy;  Laterality: N/A;  . ESOPHAGOGASTRODUODENOSCOPY    . INCISIONAL HERNIA REPAIR N/A 10/24/2015   Procedure: LAPAROSCOPIC REPAIR INCISIONAL HERNIA WITH MESH;  Surgeon: Georganna Skeans, MD;  Location: [place];  Service: General;  Laterality: N/A;  . INSERTION OF MESH N/A 10/24/2015   Procedure: INSERTION OF MESH;  Surgeon: Georganna Skeans, MD;  Location: [place];  Service: General;  Laterality: N/A;  . JOINT REPLACEMENT    . KNEE SURGERY Right    x4  . [place] IMPEDANCE STUDY N/A 04/10/2014   Procedure: [place] IMPEDANCE STUDY;  Surgeon: Gatha Mayer, MD;  [facility];  Service: Endoscopy;  Laterality: N/A;  Family History family history includes Alcohol abuse  in her father and paternal uncle; Anxiety disorder in her daughter; Bipolar disorder in her daughter; Dementia in her father and maternal grandmother; Depression in her brother and paternal uncle; Drug abuse in her brother. Reviewed again with no changes noted. Diagnosis:   Axis I: Anxiety Disorder NOS and Major Depression, Recurrent severe Axis II: Deferred Axis III:  Past Medical History:  Diagnosis Date  . Anxiety   . Arthritis   . Asthma   . COPD (chronic obstructive pulmonary disease) ([employer])   . Depression   . Diverticulitis 04/2012   [facility]  . GERD (gastroesophageal reflux disease)   . Hepatitis C    history of - received treatment  . History of pneumonia   . Hypertension   . Insomnia   . Migraine headache   . Numbness and tingling in hands   . Shortness of breath dyspnea   . Splenomegaly   . Thyroid disease    "i do not take anything for my thyroid"  . Wears glasses    Axis IV: other psychosocial or environmental problems Axis V: 51-60 moderate symptoms  ADL's:  Intact  Sleep: Good  Appetite:  Good  Suicidal Ideation:  Pt denies any thoughts, plans, intent of suicide Homicidal Ideation:  Pt denies any thoughts, plans, intent of homicide AEB (as evidenced by):per pt report  Psychiatric Specialty Exam: ROS ROS: Neuro: no headaches, ataxia, weakness GI: no N/V/D/cramps/constipation MS: no weakness, muscle cramps, aches, but does have joint pain from arthritis.    Blood pressure (!) 135/91, pulse 90, height 5' 3"  (1.6 m), weight 195 lb 6.4 oz (88.6 kg), SpO2 95 %.Body mass index is 34.61 kg/m.  General Appearance: Casual   Eye Contact::  Good  Speech:  Clear and Coherent  Volume:  Normal  Mood: Good   Affect:Bright  Thought Process:  Coherent, Intact and Logical  Orientation:  Full (Time, Place, and Person)  Thought Content:  WDL  Suicidal Thoughts:  No  Homicidal Thoughts:  No  Memory:  Immediate;   Fair Recent;   Fair Remote;   Fair   Judgement:  Good  Insight:  Good  Psychomotor Activity:  Normal  Concentration:  Good  Recall:  Good  Akathisia:  No  Handed:  Ambidextrous  AIMS (if indicated):     Assets:  Communication Skills Desire for Improvement  Sleep:      Current Medications: Current Outpatient Prescriptions  Medication Sig Dispense Refill  . albuterol (PROVENTIL HFA;VENTOLIN HFA) 108 (90 Base) MCG/ACT inhaler Inhale 2 puffs into the lungs every 6 (six) hours as needed for wheezing or shortness of breath.    Marland Kitchen albuterol (PROVENTIL) (2.5  MG/3ML) 0.083% nebulizer solution Take 2.5 mg by nebulization every 6 (six) hours as needed for wheezing or shortness of breath.    . ALPRAZolam (XANAX) 1 MG tablet Take 1 tablet (1 mg total) by mouth 4 (four) times daily. 120 tablet 2  . aspirin EC 81 MG tablet Take 81 mg by mouth daily.    . Butalbital-APAP-Caffeine 50-300-40 MG CAPS Take 1 tablet by mouth as needed (Migraine).     . celecoxib (CELEBREX) 100 MG capsule Take 100 mg by mouth 2 (two) times daily.  2  . Docusate Calcium (STOOL SOFTENER PO) Take 2 capsules by mouth daily.     Marland Kitchen escitalopram (LEXAPRO) 20 MG tablet Take 1 tablet (20 mg total) by mouth 2 (two) times daily. 60 tablet 2  . loratadine (CLARITIN) 10 MG tablet TAKE ONE TABLET BY MOUTH ONCE DAILY 30 tablet 2  . meloxicam (MOBIC) 15 MG tablet TAKE ONE TABLET BY MOUTH DAILY 30 tablet 1  . metoCLOPramide (REGLAN) 10 MG tablet Take 1 tablet every morning with breakfast and at bedtime 60 tablet 2  . metoprolol succinate (TOPROL-XL) 50 MG 24 hr tablet Take 25-50 mg by mouth 2 (two) times daily. Take 50 mg every morning Take 25 mg at bedtime    . Multiple Vitamin (MULTI-VITAMIN PO) Take 1 tablet by mouth daily.    Marland Kitchen nystatin (MYCOSTATIN) powder Apply 1 g topically as needed (under breast).   2  . polyethylene glycol powder (MIRALAX) powder Take 17 g by mouth daily. (Patient taking differently: Take 17 g by mouth as needed. ) 255 g 0  . ranitidine (ZANTAC) 300  MG tablet Take 300 mg by mouth at bedtime.  3  . Simethicone (GAS RELIEF PO) Take 2 capsules by mouth as needed (flatulence).     . traZODone (DESYREL) 150 MG tablet Take 1 tablet (150 mg total) by mouth at bedtime. 30 tablet 2   No current facility-administered medications for this visit.   Lab Results:  No results found for this or any previous visit (from the past 4032 hour(s)). Marland Kitchen Physical Findings: AIMS:  , ,  ,  ,    CIWA:    COWS:    Plan: I took her vitals.  I reviewed CC, tobacco/med/surg Hx, meds effects/ side effects, problem list, therapies and responses as well as current situation/symptoms discussed options. She will continue Xanax for anxiety .He was reminded to take no more than 1 mg 4 times a day She will continue Lexapro  20 mg twice a day for depression. She will continue  trazodone 150 mg at bedtime for sleep She'll return in 3 months See orders and pt instructions for more details. Meds ordered this encounter  Medications  . traZODone (DESYREL) 150 MG tablet    Sig: Take 1 tablet (150 mg total) by mouth at bedtime.    Dispense:  30 tablet    Refill:  2  . escitalopram (LEXAPRO) 20 MG tablet    Sig: Take 1 tablet (20 mg total) by mouth 2 (two) times daily.    Dispense:  60 tablet    Refill:  2  . ALPRAZolam (XANAX) 1 MG tablet    Sig: Take 1 tablet (1 mg total) by mouth 4 (four) times daily.    Dispense:  120 tablet    Refill:  2  Medical Decision Making Problem Points:  Established problem, stable/improving (1), Review of last therapy session (1) and Review of psycho-social stressors (1) Data Points:  Review  of medication regiment & side effects (2) I certify that outpatient services furnished can reasonably be expected to improve the patient's condition.  Udell Mazzocco, [facility] 08/19/2016, 1:23 PM

## 2016-10-02 ENCOUNTER — Encounter: Payer: Self-pay | Admitting: Internal Medicine

## 2016-11-18 ENCOUNTER — Ambulatory Visit (INDEPENDENT_AMBULATORY_CARE_PROVIDER_SITE_OTHER): Payer: Medicare Other | Admitting: Psychiatry

## 2016-11-18 ENCOUNTER — Encounter (HOSPITAL_COMMUNITY): Payer: Self-pay | Admitting: Psychiatry

## 2016-11-18 VITALS — BP 139/95 | HR 70 | Ht 63.0 in | Wt 192.0 lb

## 2016-11-18 DIAGNOSIS — Z79899 Other long term (current) drug therapy: Secondary | ICD-10-CM

## 2016-11-18 DIAGNOSIS — F332 Major depressive disorder, recurrent severe without psychotic features: Secondary | ICD-10-CM

## 2016-11-18 DIAGNOSIS — Z818 Family history of other mental and behavioral disorders: Secondary | ICD-10-CM | POA: Diagnosis not present

## 2016-11-18 DIAGNOSIS — F419 Anxiety disorder, unspecified: Secondary | ICD-10-CM | POA: Diagnosis not present

## 2016-11-18 DIAGNOSIS — Z811 Family history of alcohol abuse and dependence: Secondary | ICD-10-CM | POA: Diagnosis not present

## 2016-11-18 DIAGNOSIS — G479 Sleep disorder, unspecified: Secondary | ICD-10-CM | POA: Diagnosis not present

## 2016-11-18 DIAGNOSIS — F331 Major depressive disorder, recurrent, moderate: Secondary | ICD-10-CM

## 2016-11-18 DIAGNOSIS — Z813 Family history of other psychoactive substance abuse and dependence: Secondary | ICD-10-CM

## 2016-11-18 MED ORDER — ALPRAZOLAM 1 MG PO TABS: 1.0000 mg | ORAL_TABLET | Freq: Four times a day (QID) | ORAL | 2 refills | Status: DC

## 2016-11-18 MED ORDER — ESCITALOPRAM OXALATE 20 MG PO TABS
20.0000 mg | ORAL_TABLET | Freq: Two times a day (BID) | ORAL | 2 refills | Status: DC
Start: 2016-11-18 — End: 2017-02-17

## 2016-11-18 MED ORDER — TEMAZEPAM 15 MG PO CAPS: 15.0000 mg | ORAL_CAPSULE | Freq: Every evening | ORAL | 2 refills | Status: DC | PRN

## 2016-11-18 NOTE — Progress Notes (Signed)
Patient ID: ADENIKE SHIDLER, female   DOB: 05-11-1950, 66 y.o.   MRN: [id number] Patient ID: AMI MALLY, female   DOB: Dec 05, 1950, 66 y.o.   MRN: [id number] Patient ID: ANGALINA ANTE, female   DOB: April 18, 1950, 66 y.o.   MRN: [id number] Patient ID: CHARLOT GOUIN, female   DOB: [birth year], 66 y.o.   MRN: [id number] Patient ID: KEARAH GAYDEN, female   DOB: [birth year], 66 y.o.   MRN: [id number] Patient ID: MARIJEAN MONTANYE, female   DOB: November 23, 1950, 66 y.o.   MRN: [id number] Patient ID: LOYALTY ARENTZ, female   DOB: Feb 01, 1951, 66 y.o.   MRN: [id number] Patient ID: CLARITA MCELVAIN, female   DOB: June 22, 1950, 66 y.o.   MRN: [id number] Patient ID: MCKAELA HOWLEY, female   DOB: [birth year], 66 y.o.   MRN: [id number] Patient ID: DUANE TRIAS, female   DOB: [birth year], 66 y.o.   MRN: [id number] Patient ID: AUBRIAUNA RINER, female   DOB: July 03, 1950, 66 y.o.   MRN: [id number] [facility] [id number] Progress Note KASIA TREGO MRN: [id number] DOB: 11-16-1950 Age: 66 y.o.  Date: 11/18/2016 Start Time: 2:30 PM End Time: 2:45 PM  Chief Complaint: Chief Complaint  Patient presents with  . Depression  . Anxiety  . Follow-up    Subjective:  "I'm doing okay"  This patient is a 66 year old widowed white female. She's currently living with her daughter in [place] She has 2 grown children and her son lives in [place]. She is on disability.  The patient has a long history of depression and anxiety. She generally does pretty well but she's had some bad news lately. Her primary doctors think she has hepatitis C. She went to a GI specialist today who drew a lot of labs. Her platelets are low and her spleen is enlarged but she's not sure what's going to happen from here. She does feel like her psychiatric medications have been helpful. She sleeping pretty well.  Given all these stressors she's holding up well.  The patient returns after 3 months. For the most part she is doing [place]. However she found out that her daughter has HPV that is  spread into her uterus and other areas and is going to have to have surgery this month which may be a hysterectomy. She's very worried about this. She's not liking the effects of trazodone, she claims it makes her feel very discoordinated the next day. I told her we could try Restoril as an alternative. Generally her mood is fairly stable but she does seem worried Allergies: Allergies  Allergen Reactions  . Levothyroxine Hives and Itching  . Tylenol [Acetaminophen] Other (See Comments)    Liver function per MD    Medical History: Past Medical History:  Diagnosis Date  . Anxiety   . Arthritis   . Asthma   . COPD (chronic obstructive pulmonary disease) ([place])   . Depression   . Diverticulitis 04/2012   [facility]  . GERD (gastroesophageal reflux disease)   . Hepatitis C    history of - received treatment  . History of pneumonia   . Hypertension   . Insomnia   . Migraine headache   . Numbness and tingling in hands   . Shortness of breath dyspnea   . Splenomegaly   . Thyroid disease    "i do not take anything for my thyroid"  . Wears glasses   Surgical History: Past Surgical History:  Procedure Laterality Date  . ABDOMINAL  HYSTERECTOMY    . ANKLE SURGERY Right   . COLON SURGERY     "part of color removed"  . COLONOSCOPY    . ESOPHAGEAL MANOMETRY N/A 04/10/2014   Procedure: ESOPHAGEAL MANOMETRY (EM);  Surgeon: Gatha Mayer, MD;  Location: [facility];  Service: Endoscopy;  Laterality: N/A;  . ESOPHAGOGASTRODUODENOSCOPY    . INCISIONAL HERNIA REPAIR N/A 10/24/2015   Procedure: LAPAROSCOPIC REPAIR INCISIONAL HERNIA WITH MESH;  Surgeon: Georganna Skeans, MD;  Location: [place];  Service: General;  Laterality: N/A;  . INSERTION OF MESH N/A 10/24/2015   Procedure: INSERTION OF MESH;  Surgeon: Georganna Skeans, MD;  Location: [place];  Service: General;  Laterality: N/A;  . JOINT REPLACEMENT    . KNEE SURGERY Right    x4  . [place] IMPEDANCE STUDY N/A 04/10/2014   Procedure: [place]  IMPEDANCE STUDY;  Surgeon: Gatha Mayer, MD;  [facility];  Service: Endoscopy;  Laterality: N/A;  Family History family history includes Alcohol abuse in her father and paternal uncle; Anxiety disorder in her daughter; Bipolar disorder in her daughter; Dementia in her father and maternal grandmother; Depression in her brother and paternal uncle; Drug abuse in her brother. Reviewed again with no changes noted. Diagnosis:   Axis I: Anxiety Disorder NOS and Major Depression, Recurrent severe Axis II: Deferred Axis III:  Past Medical History:  Diagnosis Date  . Anxiety   . Arthritis   . Asthma   . COPD (chronic obstructive pulmonary disease) ([place])   . Depression   . Diverticulitis 04/2012   [facility]  . GERD (gastroesophageal reflux disease)   . Hepatitis C    history of - received treatment  . History of pneumonia   . Hypertension   . Insomnia   . Migraine headache   . Numbness and tingling in hands   . Shortness of breath dyspnea   . Splenomegaly   . Thyroid disease    "i do not take anything for my thyroid"  . Wears glasses    Axis IV: other psychosocial or environmental problems Axis V: 51-60 moderate symptoms  ADL's:  Intact  Sleep: Good with medication  Appetite:  Good  Suicidal Ideation:  Pt denies any thoughts, plans, intent of suicide Homicidal Ideation:  Pt denies any thoughts, plans, intent of homicide AEB (as evidenced by):per pt report  Psychiatric Specialty Exam: ROS ROS: Neuro: no headaches, ataxia, weakness GI: no N/V/D/cramps/constipation MS: no weakness, muscle cramps, aches, but does have joint pain from arthritis.    Blood pressure (!) 139/95, pulse 70, height 5' 3"  (1.6 m), weight 192 lb (87.1 kg).Body mass index is 34.01 kg/m.  General Appearance: Casual   Eye Contact::  Good  Speech:  Clear and Coherent  Volume:  Normal  Mood: Anxious   Affect:Congruent   Thought Process:  Coherent, Intact and Logical   Orientation:  Full (Time, Place, and Person)  Thought Content:  WDL  Suicidal Thoughts:  No  Homicidal Thoughts:  No  Memory:  Immediate;   Fair Recent;   Fair Remote;   Fair  Judgement:  Good  Insight:  Good  Psychomotor Activity:  Normal  Concentration:  Good  Recall:  Good  Akathisia:  No  Handed:  Ambidextrous  AIMS (if indicated):     Assets:  Communication Skills Desire for Improvement  Sleep:      Current Medications: Current Outpatient Prescriptions  Medication Sig Dispense Refill  . albuterol (PROVENTIL HFA;VENTOLIN HFA) 108 (90 Base) MCG/ACT  inhaler Inhale 2 puffs into the lungs every 6 (six) hours as needed for wheezing or shortness of breath.    Marland Kitchen albuterol (PROVENTIL) (2.5 MG/3ML) 0.083% nebulizer solution Take 2.5 mg by nebulization every 6 (six) hours as needed for wheezing or shortness of breath.    . ALPRAZolam (XANAX) 1 MG tablet Take 1 tablet (1 mg total) by mouth 4 (four) times daily. 120 tablet 2  . aspirin EC 81 MG tablet Take 81 mg by mouth daily.    . Butalbital-APAP-Caffeine 50-300-40 MG CAPS Take 1 tablet by mouth as needed (Migraine).     . celecoxib (CELEBREX) 100 MG capsule Take 100 mg by mouth 2 (two) times daily.  2  . Docusate Calcium (STOOL SOFTENER PO) Take 2 capsules by mouth daily.     Marland Kitchen escitalopram (LEXAPRO) 20 MG tablet Take 1 tablet (20 mg total) by mouth 2 (two) times daily. 60 tablet 2  . loratadine (CLARITIN) 10 MG tablet TAKE ONE TABLET BY MOUTH ONCE DAILY 30 tablet 2  . meloxicam (MOBIC) 15 MG tablet TAKE ONE TABLET BY MOUTH DAILY 30 tablet 1  . metoCLOPramide (REGLAN) 10 MG tablet Take 1 tablet every morning with breakfast and at bedtime 60 tablet 2  . metoprolol succinate (TOPROL-XL) 50 MG 24 hr tablet Take 25-50 mg by mouth 2 (two) times daily. Take 50 mg every morning Take 25 mg at bedtime    . Multiple Vitamin (MULTI-VITAMIN PO) Take 1 tablet by mouth daily.    Marland Kitchen nystatin (MYCOSTATIN) powder Apply 1 g topically as needed (under  breast).   2  . polyethylene glycol powder (MIRALAX) powder Take 17 g by mouth daily. (Patient taking differently: Take 17 g by mouth as needed. ) 255 g 0  . ranitidine (ZANTAC) 300 MG tablet Take 300 mg by mouth at bedtime.  3  . Simethicone (GAS RELIEF PO) Take 2 capsules by mouth as needed (flatulence).     . temazepam (RESTORIL) 15 MG capsule Take 1 capsule (15 mg total) by mouth at bedtime as needed for sleep. 30 capsule 2   No current facility-administered medications for this visit.   Lab Results:  No results found for this or any previous visit (from the past 4032 hour(s)). Marland Kitchen Physical Findings: AIMS:  , ,  ,  ,    CIWA:    COWS:    Plan: I took her vitals.  I reviewed CC, tobacco/med/surg Hx, meds effects/ side effects, problem list, therapies and responses as well as current situation/symptoms discussed options. She will continue Xanax for anxiety .He was reminded to take no more than 1 mg 4 times a day She will continue Lexapro  20 mg twice a day for depression. She will discontinue trazodone and start Restoril 15 mg at bedtime as needed for sleep She'll return in 3 months See orders and pt instructions for more details. Meds ordered this encounter  Medications  . escitalopram (LEXAPRO) 20 MG tablet    Sig: Take 1 tablet (20 mg total) by mouth 2 (two) times daily.    Dispense:  60 tablet    Refill:  2  . temazepam (RESTORIL) 15 MG capsule    Sig: Take 1 capsule (15 mg total) by mouth at bedtime as needed for sleep.    Dispense:  30 capsule    Refill:  2  . ALPRAZolam (XANAX) 1 MG tablet    Sig: Take 1 tablet (1 mg total) by mouth 4 (four) times daily.  Dispense:  120 tablet    Refill:  2  Medical Decision Making Problem Points:  Established problem, stable/improving (1), Review of last therapy session (1) and Review of psycho-social stressors (1) Data Points:  Review of medication regiment & side effects (2) I certify that outpatient services furnished can reasonably be  expected to improve the patient's condition.  Tayleigh Wetherell, [facility] 11/18/2016, 1:30 PM

## 2017-01-24 ENCOUNTER — Other Ambulatory Visit (HOSPITAL_COMMUNITY): Payer: Self-pay | Admitting: Psychiatry

## 2017-02-17 ENCOUNTER — Encounter (HOSPITAL_COMMUNITY): Payer: Self-pay | Admitting: Psychiatry

## 2017-02-17 ENCOUNTER — Ambulatory Visit (INDEPENDENT_AMBULATORY_CARE_PROVIDER_SITE_OTHER): Payer: Medicare Other | Admitting: Psychiatry

## 2017-02-17 VITALS — BP 142/84 | HR 77 | Ht 63.0 in | Wt 201.0 lb

## 2017-02-17 DIAGNOSIS — Z810 Family history of intellectual disabilities: Secondary | ICD-10-CM

## 2017-02-17 DIAGNOSIS — Z811 Family history of alcohol abuse and dependence: Secondary | ICD-10-CM

## 2017-02-17 DIAGNOSIS — M549 Dorsalgia, unspecified: Secondary | ICD-10-CM | POA: Diagnosis not present

## 2017-02-17 DIAGNOSIS — F331 Major depressive disorder, recurrent, moderate: Secondary | ICD-10-CM | POA: Diagnosis not present

## 2017-02-17 DIAGNOSIS — M2550 Pain in unspecified joint: Secondary | ICD-10-CM

## 2017-02-17 DIAGNOSIS — Z818 Family history of other mental and behavioral disorders: Secondary | ICD-10-CM | POA: Diagnosis not present

## 2017-02-17 DIAGNOSIS — G4700 Insomnia, unspecified: Secondary | ICD-10-CM | POA: Diagnosis not present

## 2017-02-17 DIAGNOSIS — R450 Nervousness: Secondary | ICD-10-CM | POA: Diagnosis not present

## 2017-02-17 DIAGNOSIS — Z813 Family history of other psychoactive substance abuse and dependence: Secondary | ICD-10-CM | POA: Diagnosis not present

## 2017-02-17 DIAGNOSIS — F419 Anxiety disorder, unspecified: Secondary | ICD-10-CM | POA: Diagnosis not present

## 2017-02-17 DIAGNOSIS — Z736 Limitation of activities due to disability: Secondary | ICD-10-CM | POA: Diagnosis not present

## 2017-02-17 MED ORDER — FLUOXETINE HCL 40 MG PO CAPS: 40.0000 mg | ORAL_CAPSULE | Freq: Every day | ORAL | 2 refills | Status: DC

## 2017-02-17 MED ORDER — TEMAZEPAM 30 MG PO CAPS: 30.0000 mg | ORAL_CAPSULE | Freq: Every evening | ORAL | 0 refills | Status: DC | PRN

## 2017-02-17 MED ORDER — ALPRAZOLAM 1 MG PO TABS: 1.0000 mg | ORAL_TABLET | Freq: Four times a day (QID) | ORAL | 2 refills | Status: DC

## 2017-02-17 NOTE — Progress Notes (Signed)
[facility] MD/PA/NP OP Progress Note  02/17/2017 1:31 PM [name]  MRN:  [id number]  Chief Complaint:  Chief Complaint    Depression; Anxiety; Follow-up     GXQ:JJHE patient is a 66 year old widowed white female. She's currently living with her daughter in [place] She has 2 grown children and her son lives in [place]. She is on disability.  The Patient returns after 3 months. She is frustrated because her grown daughter is not helping her financially. Her daughter recently got approved for disability but still unsure the money with her. She is also having a lot of arthritic pain from rheumatoid arthritis. She is on tramadol but I suggested she get a referral to rheumatology. Her mood has not been as good lately she's been crying a lot and having low energy. She doesn't think the Lexapro is working very well. She denies being suicidal. She is not sleeping as well as she did at first on Restoril 15 mg at bedtime. I suggested that we make some changes in her regimen Visit Diagnosis:    ICD-10-CM   1. Major depressive disorder, recurrent episode, moderate (HCC) F33.1     Past Psychiatric History: None  Past Medical History:  Past Medical History:  Diagnosis Date  . Anxiety   . Arthritis   . Asthma   . COPD (chronic obstructive pulmonary disease) ([place])   . Depression   . Diverticulitis 04/2012   [facility]  . GERD (gastroesophageal reflux disease)   . Hepatitis C    history of - received treatment  . History of pneumonia   . Hypertension   . Insomnia   . Migraine headache   . Numbness and tingling in hands   . Shortness of breath dyspnea   . Splenomegaly   . Thyroid disease    "i do not take anything for my thyroid"  . Wears glasses     Past Surgical History:  Procedure Laterality Date  . ABDOMINAL HYSTERECTOMY    . ANKLE SURGERY Right   . COLON SURGERY     "part of color removed"  . COLONOSCOPY    . ESOPHAGEAL MANOMETRY N/A 04/10/2014   Procedure: ESOPHAGEAL  MANOMETRY (EM);  Surgeon: Gatha Mayer, MD;  [facility];  Service: Endoscopy;  Laterality: N/A;  . ESOPHAGOGASTRODUODENOSCOPY    . INCISIONAL HERNIA REPAIR N/A 10/24/2015   Procedure: LAPAROSCOPIC REPAIR INCISIONAL HERNIA WITH MESH;  Surgeon: Georganna Skeans, MD;  Location: [place];  Service: General;  Laterality: N/A;  . INSERTION OF MESH N/A 10/24/2015   Procedure: INSERTION OF MESH;  Surgeon: Georganna Skeans, MD;  Location: [place];  Service: General;  Laterality: N/A;  . JOINT REPLACEMENT    . KNEE SURGERY Right    x4  . [place] IMPEDANCE STUDY N/A 04/10/2014   Procedure: [place] IMPEDANCE STUDY;  Surgeon: Gatha Mayer, MD;  [facility];  Service: Endoscopy;  Laterality: N/A;    Family Psychiatric History: See below  Family History:  Family History  Problem Relation Age of Onset  . Alcohol abuse Father   . Dementia Father   . Dementia Maternal Grandmother   . Bipolar disorder Daughter   . Anxiety disorder Daughter   . Depression Brother   . Drug abuse Brother   . Alcohol abuse Paternal Uncle   . Depression Paternal Uncle   . ADD / ADHD Neg Hx     Social History:  Social History   Socioeconomic History  . Marital status: Widowed  Spouse name: None  . Number of children: 2  . Years of education: 12th grade  . Highest education level: None  Social Needs  . Financial resource strain: None  . Food insecurity - worry: None  . Food insecurity - inability: None  . Transportation needs - medical: None  . Transportation needs - non-medical: None  Occupational History  . Occupation: RETIRED  Tobacco Use  . Smoking status: Never Smoker  . Smokeless tobacco: Never Used  Substance and Sexual Activity  . Alcohol use: Yes    Alcohol/week: 1.8 oz    Types: 3 Cans of beer per week    Comment: 2-3 drinks per week  . Drug use: No  . Sexual activity: No  Other Topics Concern  . None  Social History Narrative  . None    Allergies:  Allergies  Allergen Reactions   . Levothyroxine Hives and Itching  . Tylenol [Acetaminophen] Other (See Comments)    Liver function per MD     Metabolic Disorder Labs: No results found for: HGBA1C, MPG No results found for: PROLACTIN No results found for: CHOL, TRIG, HDL, CHOLHDL, VLDL, LDLCALC Lab Results  Component Value Date   TSH 4.540 (H) 10/28/2007   TSH 1.782 11/03/2006    Therapeutic Level Labs: No results found for: LITHIUM No results found for: VALPROATE No components found for:  CBMZ  Current Medications: Current Outpatient Medications  Medication Sig Dispense Refill  . albuterol (PROVENTIL HFA;VENTOLIN HFA) 108 (90 Base) MCG/ACT inhaler Inhale 2 puffs into the lungs every 6 (six) hours as needed for wheezing or shortness of breath.    Marland Kitchen albuterol (PROVENTIL) (2.5 MG/3ML) 0.083% nebulizer solution Take 2.5 mg by nebulization every 6 (six) hours as needed for wheezing or shortness of breath.    . ALPRAZolam (XANAX) 1 MG tablet Take 1 tablet (1 mg total) by mouth 4 (four) times daily. 120 tablet 2  . aspirin EC 81 MG tablet Take 81 mg by mouth daily.    . Butalbital-APAP-Caffeine 50-300-40 MG CAPS Take 1 tablet by mouth as needed (Migraine).     . celecoxib (CELEBREX) 100 MG capsule Take 100 mg by mouth 2 (two) times daily.  2  . Docusate Calcium (STOOL SOFTENER PO) Take 2 capsules by mouth daily.     Marland Kitchen loratadine (CLARITIN) 10 MG tablet TAKE ONE TABLET BY MOUTH ONCE DAILY 30 tablet 2  . meloxicam (MOBIC) 15 MG tablet TAKE ONE TABLET BY MOUTH DAILY 30 tablet 1  . metoCLOPramide (REGLAN) 10 MG tablet Take 1 tablet every morning with breakfast and at bedtime 60 tablet 2  . metoprolol succinate (TOPROL-XL) 50 MG 24 hr tablet Take 25-50 mg by mouth 2 (two) times daily. Take 50 mg every morning Take 25 mg at bedtime    . Multiple Vitamin (MULTI-VITAMIN PO) Take 1 tablet by mouth daily.    Marland Kitchen nystatin (MYCOSTATIN) powder Apply 1 g topically as needed (under breast).   2  . polyethylene glycol powder  (MIRALAX) powder Take 17 g by mouth daily. (Patient taking differently: Take 17 g by mouth as needed. ) 255 g 0  . ranitidine (ZANTAC) 300 MG tablet Take 300 mg by mouth at bedtime.  3  . Simethicone (GAS RELIEF PO) Take 2 capsules by mouth as needed (flatulence).     Marland Kitchen FLUoxetine (PROZAC) 40 MG capsule Take 1 capsule (40 mg total) by mouth daily. 30 capsule 2  . temazepam (RESTORIL) 30 MG capsule Take 1 capsule (30 mg  total) by mouth at bedtime as needed for sleep. 30 capsule 0   No current facility-administered medications for this visit.      Musculoskeletal: Strength & Muscle Tone: decreased Gait & Station: unsteady Patient leans: N/A  Psychiatric Specialty Exam: Review of Systems  Musculoskeletal: Positive for back pain and joint pain.  Psychiatric/Behavioral: Positive for depression. The patient has insomnia.     Blood pressure (!) 142/84, pulse 77, height 5' 3"  (1.6 m), weight 201 lb (91.2 kg), SpO2 96 %.Body mass index is 35.61 kg/m.  General Appearance: Casual and Fairly Groomed  Eye Contact:  Good  Speech:  Clear and Coherent  Volume:  Normal  Mood:  Depressed  Affect:  Constricted  Thought Process:  Goal Directed  Orientation:  Full (Time, Place, and Person)  Thought Content: Rumination   Suicidal Thoughts:  No  Homicidal Thoughts:  No  Memory:  Immediate;   Good Recent;   Fair Remote;   Fair  Judgement:  Good  Insight:  Fair  Psychomotor Activity:  Decreased  Concentration:  Concentration: Fair and Attention Span: Fair  Recall:  Good  Fund of Knowledge: Good  Language: Good  Akathisia:  No  Handed:  Right  AIMS (if indicated): not done  Assets:  Communication Skills Desire for Improvement Resilience Social Support Talents/Skills  ADL's:  Intact  Cognition: WNL  Sleep:  Fair   Screenings:   Assessment and Plan: This patient is a 66 year old female with a history of depression and anxiety. She doesn't feel like the Lexapro is working anymore so we  will switch to Prozac 40 mg daily for depression. She'll continue Xanax 1 mg 4 times a day for anxiety. The Restoril at 15 mg is not helping her sleep as well so we'll increase to 30 mg at bedtime. She'll return to see me in 6 weeks   [name], MD 02/17/2017, 1:31 PM

## 2017-03-31 ENCOUNTER — Ambulatory Visit (INDEPENDENT_AMBULATORY_CARE_PROVIDER_SITE_OTHER): Payer: Medicare Other | Admitting: Psychiatry

## 2017-03-31 ENCOUNTER — Encounter (HOSPITAL_COMMUNITY): Payer: Self-pay | Admitting: Psychiatry

## 2017-03-31 ENCOUNTER — Encounter (INDEPENDENT_AMBULATORY_CARE_PROVIDER_SITE_OTHER): Payer: Self-pay

## 2017-03-31 VITALS — BP 125/85 | HR 59 | Ht 63.0 in | Wt 206.0 lb

## 2017-03-31 DIAGNOSIS — F419 Anxiety disorder, unspecified: Secondary | ICD-10-CM

## 2017-03-31 DIAGNOSIS — Z736 Limitation of activities due to disability: Secondary | ICD-10-CM

## 2017-03-31 DIAGNOSIS — Z818 Family history of other mental and behavioral disorders: Secondary | ICD-10-CM

## 2017-03-31 DIAGNOSIS — Z810 Family history of intellectual disabilities: Secondary | ICD-10-CM

## 2017-03-31 DIAGNOSIS — M2550 Pain in unspecified joint: Secondary | ICD-10-CM

## 2017-03-31 DIAGNOSIS — R450 Nervousness: Secondary | ICD-10-CM | POA: Diagnosis not present

## 2017-03-31 DIAGNOSIS — Z811 Family history of alcohol abuse and dependence: Secondary | ICD-10-CM | POA: Diagnosis not present

## 2017-03-31 DIAGNOSIS — Z813 Family history of other psychoactive substance abuse and dependence: Secondary | ICD-10-CM

## 2017-03-31 DIAGNOSIS — F331 Major depressive disorder, recurrent, moderate: Secondary | ICD-10-CM

## 2017-03-31 MED ORDER — ALPRAZOLAM 1 MG PO TABS: 1.0000 mg | ORAL_TABLET | Freq: Four times a day (QID) | ORAL | 2 refills | Status: DC

## 2017-03-31 MED ORDER — FLUOXETINE HCL 20 MG PO CAPS: 20.0000 mg | ORAL_CAPSULE | Freq: Every day | ORAL | 2 refills | Status: DC

## 2017-03-31 MED ORDER — TEMAZEPAM 30 MG PO CAPS: 30.0000 mg | ORAL_CAPSULE | Freq: Every evening | ORAL | 2 refills | Status: DC | PRN

## 2017-03-31 NOTE — Progress Notes (Signed)
[place] MD/PA/NP OP Progress Note  03/31/2017 2:27 PM [name]  MRN:  [id number]  Chief Complaint:  Chief Complaint    Depression; Follow-up     HPI: This patient is a 67 year old widowed white female who lives with her grown daughter in [place].  She also has a son in [place].  She is on disability.  The patient returns for follow-up of treatment of depression and anxiety.  Last month she stated that she felt depressed and was having a lot of arthritic pain.  Her mood had been low she had been crying and had low energy.  We switched to Prozac from Lexapro and she is on 40 mg daily.  She also was put on a higher dose of Restoril-30 mg at bedtime.  Higher dose of Prozac seems to be making her drowsy.  She also showed me a rash on her legs that looks to me like erythema migrans.  She does not remember any tick bites but she does have dogs in the home.  Her primary doctor did not know what it is but she is going to see a dermatologist this week.  If by any chance she has Lyme disease it may explain the joint pain the fatigue and low energy etc.  She is also currently not on thyroid medicine which she claims is made her more fatigued.  At any rate we discussed going back down on the Prozac to 20 mg for now and awaiting the results of her dermatology visit Visit Diagnosis:    ICD-10-CM   1. Major depressive disorder, recurrent episode, moderate (HCC) F33.1     Past Psychiatric History: none  Past Medical History:  Past Medical History:  Diagnosis Date  . Anxiety   . Arthritis   . Asthma   . COPD (chronic obstructive pulmonary disease) ([place])   . Depression   . Diverticulitis 04/2012   [facility]  . GERD (gastroesophageal reflux disease)   . Hepatitis C    history of - received treatment  . History of pneumonia   . Hypertension   . Insomnia   . Migraine headache   . Numbness and tingling in hands   . Shortness of breath dyspnea   . Splenomegaly   . Thyroid disease    "i do  not take anything for my thyroid"  . Wears glasses     Past Surgical History:  Procedure Laterality Date  . ABDOMINAL HYSTERECTOMY    . ANKLE SURGERY Right   . COLON SURGERY     "part of color removed"  . COLONOSCOPY    . ESOPHAGEAL MANOMETRY N/A 04/10/2014   Procedure: ESOPHAGEAL MANOMETRY (EM);  Surgeon: Gatha Mayer, MD;  [facility];  Service: Endoscopy;  Laterality: N/A;  . ESOPHAGOGASTRODUODENOSCOPY    . INCISIONAL HERNIA REPAIR N/A 10/24/2015   Procedure: LAPAROSCOPIC REPAIR INCISIONAL HERNIA WITH MESH;  Surgeon: Georganna Skeans, MD;  Location: [place];  Service: General;  Laterality: N/A;  . INSERTION OF MESH N/A 10/24/2015   Procedure: INSERTION OF MESH;  Surgeon: Georganna Skeans, MD;  Location: [place];  Service: General;  Laterality: N/A;  . JOINT REPLACEMENT    . KNEE SURGERY Right    x4  . [place] IMPEDANCE STUDY N/A 04/10/2014   Procedure: [place] IMPEDANCE STUDY;  Surgeon: Gatha Mayer, MD;  [facility];  Service: Endoscopy;  Laterality: N/A;    Family Psychiatric History: See below  Family History:  Family History  Problem Relation Age  of Onset  . Alcohol abuse Father   . Dementia Father   . Dementia Maternal Grandmother   . Bipolar disorder Daughter   . Anxiety disorder Daughter   . Depression Brother   . Drug abuse Brother   . Alcohol abuse Paternal Uncle   . Depression Paternal Uncle   . ADD / ADHD Neg Hx     Social History:  Social History   Socioeconomic History  . Marital status: Widowed    Spouse name: None  . Number of children: 2  . Years of education: 12th grade  . Highest education level: None  Social Needs  . Financial resource strain: None  . Food insecurity - worry: None  . Food insecurity - inability: None  . Transportation needs - medical: None  . Transportation needs - non-medical: None  Occupational History  . Occupation: RETIRED  Tobacco Use  . Smoking status: Never Smoker  . Smokeless tobacco: Never Used  Substance  and Sexual Activity  . Alcohol use: Yes    Alcohol/week: 1.8 oz    Types: 3 Cans of beer per week    Comment: 2-3 drinks per week  . Drug use: No  . Sexual activity: No  Other Topics Concern  . None  Social History Narrative  . None    Allergies:  Allergies  Allergen Reactions  . Levothyroxine Hives and Itching  . Tylenol [Acetaminophen] Other (See Comments)    Liver function per MD     Metabolic Disorder Labs: No results found for: HGBA1C, MPG No results found for: PROLACTIN No results found for: CHOL, TRIG, HDL, CHOLHDL, VLDL, LDLCALC Lab Results  Component Value Date   TSH 4.540 (H) 10/28/2007   TSH 1.782 11/03/2006    Therapeutic Level Labs: No results found for: LITHIUM No results found for: VALPROATE No components found for:  CBMZ  Current Medications: Current Outpatient Medications  Medication Sig Dispense Refill  . albuterol (PROVENTIL HFA;VENTOLIN HFA) 108 (90 Base) MCG/ACT inhaler Inhale 2 puffs into the lungs every 6 (six) hours as needed for wheezing or shortness of breath.    Marland Kitchen albuterol (PROVENTIL) (2.5 MG/3ML) 0.083% nebulizer solution Take 2.5 mg by nebulization every 6 (six) hours as needed for wheezing or shortness of breath.    . ALPRAZolam (XANAX) 1 MG tablet Take 1 tablet (1 mg total) by mouth 4 (four) times daily. 120 tablet 2  . aspirin EC 81 MG tablet Take 81 mg by mouth daily.    . Butalbital-APAP-Caffeine 50-300-40 MG CAPS Take 1 tablet by mouth as needed (Migraine).     . celecoxib (CELEBREX) 100 MG capsule Take 100 mg by mouth 2 (two) times daily.  2  . Docusate Calcium (STOOL SOFTENER PO) Take 2 capsules by mouth daily.     Marland Kitchen FLUoxetine (PROZAC) 40 MG capsule Take 1 capsule (40 mg total) by mouth daily. 30 capsule 2  . loratadine (CLARITIN) 10 MG tablet TAKE ONE TABLET BY MOUTH ONCE DAILY 30 tablet 2  . meloxicam (MOBIC) 15 MG tablet TAKE ONE TABLET BY MOUTH DAILY 30 tablet 1  . metoCLOPramide (REGLAN) 10 MG tablet Take 1 tablet every  morning with breakfast and at bedtime 60 tablet 2  . metoprolol succinate (TOPROL-XL) 50 MG 24 hr tablet Take 25-50 mg by mouth 2 (two) times daily. Take 50 mg every morning Take 25 mg at bedtime    . Multiple Vitamin (MULTI-VITAMIN PO) Take 1 tablet by mouth daily.    Marland Kitchen nystatin (MYCOSTATIN) powder  Apply 1 g topically as needed (under breast).   2  . polyethylene glycol powder (MIRALAX) powder Take 17 g by mouth daily. (Patient taking differently: Take 17 g by mouth as needed. ) 255 g 0  . ranitidine (ZANTAC) 300 MG tablet Take 300 mg by mouth at bedtime.  3  . Simethicone (GAS RELIEF PO) Take 2 capsules by mouth as needed (flatulence).     . temazepam (RESTORIL) 30 MG capsule Take 1 capsule (30 mg total) by mouth at bedtime as needed for sleep. 30 capsule 2  . FLUoxetine (PROZAC) 20 MG capsule Take 1 capsule (20 mg total) by mouth daily. 30 capsule 2   No current facility-administered medications for this visit.      Musculoskeletal: Strength & Muscle Tone: within normal limits Gait & Station: normal Patient leans: N/A  Psychiatric Specialty Exam: Review of Systems  Constitutional: Positive for malaise/fatigue.  Musculoskeletal: Positive for joint pain and myalgias.  Skin: Positive for rash.  Psychiatric/Behavioral: The patient is nervous/anxious.   All other systems reviewed and are negative.   Blood pressure 125/85, pulse (!) 59, height 5' 3"  (1.6 m), weight 206 lb (93.4 kg), SpO2 96 %.Body mass index is 36.49 kg/m.  General Appearance: Casual and Fairly Groomed  Eye Contact:  Good  Speech:  Clear and Coherent  Volume:  Normal  Mood:  Anxious  Affect:  Congruent  Thought Process:  Goal Directed  Orientation:  Full (Time, Place, and Person)  Thought Content: Rumination   Suicidal Thoughts:  No  Homicidal Thoughts:  No  Memory:  Immediate;   Good Recent;   Fair Remote;   Fair  Judgement:  Fair  Insight:  Fair  Psychomotor Activity:  Decreased  Concentration:   Concentration: Fair and Attention Span: Fair  Recall:  Good  Fund of Knowledge: Good  Language: Good  Akathisia:  No  Handed:  Right  AIMS (if indicated): not done  Assets:  Warehouse manager Resources/Insurance Resilience Social Support Talents/Skills  ADL's:  Intact  Cognition: WNL  Sleep:  Good   Screenings:   Assessment and Plan: Patient is a 67 year old female with a history of depression and anxiety.  She is more fatigued recently and has developed a bull's-eye-like rash.  Fortunately she is seeing a dermatologist this week.  Since she is more fatigued on the higher dose of Prozac we will drop it down to 20 mg daily.  She will continue Restoril 30 mg at bedtime for sleep and Xanax 1 mg 4 times a day for anxiety.  At her request she will return to see me in 3 months.   [name], MD 03/31/2017, 2:27 PM

## 2017-05-18 ENCOUNTER — Telehealth (HOSPITAL_COMMUNITY): Payer: Self-pay | Admitting: *Deleted

## 2017-05-18 NOTE — Telephone Encounter (Signed)
Dr Harrington Challenger Patient called stating that she called her Pharmacy to get early refill on medication because she's going out of  town to [facility]. And was told that the Xanax would have to be approved by provider for early refill.

## 2017-05-18 NOTE — Telephone Encounter (Signed)
Ok, she can fill it on 3/9

## 2017-05-18 NOTE — Telephone Encounter (Signed)
Actually the pharmacy called also & they said it'd due 3/11 & she & her sister are leaving together on 3/10

## 2017-05-18 NOTE — Telephone Encounter (Signed)
It is not due for refill until 3/15. Is she going to be back by then?

## 2017-05-20 ENCOUNTER — Other Ambulatory Visit (HOSPITAL_COMMUNITY): Payer: Self-pay | Admitting: Psychiatry

## 2017-06-30 ENCOUNTER — Ambulatory Visit (HOSPITAL_COMMUNITY): Payer: Medicare Other | Admitting: Psychiatry

## 2017-07-15 ENCOUNTER — Other Ambulatory Visit (HOSPITAL_COMMUNITY): Payer: Self-pay | Admitting: Psychiatry

## 2017-07-16 ENCOUNTER — Ambulatory Visit (HOSPITAL_COMMUNITY): Payer: Medicare Other | Admitting: Psychiatry

## 2017-07-16 ENCOUNTER — Telehealth (HOSPITAL_COMMUNITY): Payer: Self-pay

## 2017-07-16 ENCOUNTER — Other Ambulatory Visit (HOSPITAL_COMMUNITY): Payer: Self-pay | Admitting: Psychiatry

## 2017-07-16 MED ORDER — TEMAZEPAM 30 MG PO CAPS: 30.0000 mg | ORAL_CAPSULE | Freq: Every evening | ORAL | 0 refills | Status: DC | PRN

## 2017-07-16 MED ORDER — ALPRAZOLAM 1 MG PO TABS: 1.0000 mg | ORAL_TABLET | Freq: Four times a day (QID) | ORAL | 0 refills | Status: DC

## 2017-07-16 MED ORDER — FLUOXETINE HCL 20 MG PO CAPS: 20.0000 mg | ORAL_CAPSULE | Freq: Every day | ORAL | 0 refills | Status: DC

## 2017-07-16 NOTE — Telephone Encounter (Signed)
Ordered each for a month

## 2017-07-16 NOTE — Telephone Encounter (Signed)
Patient had an appointment on 07-16-17 that was rescheduled to 07-30-17.  She currently needs refills on Alprazolam 31m, Fluoxetine 249m and Temazepam 3061mCalled pharmacy and clarified that patient needs refills on all these meds. Please advise

## 2017-07-16 NOTE — Telephone Encounter (Signed)
Called and left voice mail message to inform patient that prescription was at pharmacy

## 2017-07-30 ENCOUNTER — Ambulatory Visit (INDEPENDENT_AMBULATORY_CARE_PROVIDER_SITE_OTHER): Payer: Medicare Other | Admitting: Psychiatry

## 2017-07-30 ENCOUNTER — Encounter (HOSPITAL_COMMUNITY): Payer: Self-pay | Admitting: Psychiatry

## 2017-07-30 VITALS — BP 145/86 | HR 70 | Ht 63.0 in | Wt 213.0 lb

## 2017-07-30 DIAGNOSIS — Z813 Family history of other psychoactive substance abuse and dependence: Secondary | ICD-10-CM | POA: Diagnosis not present

## 2017-07-30 DIAGNOSIS — F331 Major depressive disorder, recurrent, moderate: Secondary | ICD-10-CM

## 2017-07-30 DIAGNOSIS — Z736 Limitation of activities due to disability: Secondary | ICD-10-CM

## 2017-07-30 DIAGNOSIS — F419 Anxiety disorder, unspecified: Secondary | ICD-10-CM | POA: Diagnosis not present

## 2017-07-30 DIAGNOSIS — M2550 Pain in unspecified joint: Secondary | ICD-10-CM | POA: Diagnosis not present

## 2017-07-30 DIAGNOSIS — Z818 Family history of other mental and behavioral disorders: Secondary | ICD-10-CM

## 2017-07-30 DIAGNOSIS — Z810 Family history of intellectual disabilities: Secondary | ICD-10-CM

## 2017-07-30 DIAGNOSIS — Z811 Family history of alcohol abuse and dependence: Secondary | ICD-10-CM

## 2017-07-30 MED ORDER — ALPRAZOLAM 1 MG PO TABS: 1.0000 mg | ORAL_TABLET | Freq: Four times a day (QID) | ORAL | 2 refills | Status: DC

## 2017-07-30 MED ORDER — TEMAZEPAM 30 MG PO CAPS: 30.0000 mg | ORAL_CAPSULE | Freq: Every evening | ORAL | 2 refills | Status: DC | PRN

## 2017-07-30 MED ORDER — FLUOXETINE HCL 20 MG PO CAPS: 20.0000 mg | ORAL_CAPSULE | Freq: Every day | ORAL | 2 refills | Status: DC

## 2017-07-30 NOTE — Progress Notes (Signed)
[place] MD/PA/NP OP Progress Note  07/30/2017 3:42 PM [name]  MRN:  [id number]  Chief Complaint:  Chief Complaint    Depression; Anxiety; Follow-up     HPI: This patient is a 67 year old widowed white female who lives with her grown daughter in [place].  She has a son in [place].  She is on disability.  The patient returns after 4 months for follow-up of treatment of depression and anxiety.  Last time she had a rash and she thought she might of been bitten by a tick.  She was checked for Lyme disease and this was negative.  She still having a lot of fatigue and joint and muscle aches.  She states that she has a history of hypothyroidism and she wonders if this is happening again.  She is currently not on thyroid replacement.  She has [name] appointment next week to get this checked out.  Her mood is pretty good but her energy is low.  She sleeps well with the Restoril and the Xanax continues to help her anxiety.  She denies suicidal ideation.  She states that recently she has been getting out more Visit Diagnosis:    ICD-10-CM   1. Major depressive disorder, recurrent episode, moderate (HCC) F33.1     Past Psychiatric History: none  Past Medical History:  Past Medical History:  Diagnosis Date  . Anxiety   . Arthritis   . Asthma   . COPD (chronic obstructive pulmonary disease) ([place])   . Depression   . Diverticulitis 04/2012   [facility]  . GERD (gastroesophageal reflux disease)   . Hepatitis C    history of - received treatment  . History of pneumonia   . Hypertension   . Insomnia   . Migraine headache   . Numbness and tingling in hands   . Shortness of breath dyspnea   . Splenomegaly   . Thyroid disease    "i do not take anything for my thyroid"  . Wears glasses     Past Surgical History:  Procedure Laterality Date  . ABDOMINAL HYSTERECTOMY    . ANKLE SURGERY Right   . COLON SURGERY     "part of color removed"  . COLONOSCOPY    . ESOPHAGEAL MANOMETRY N/A  04/10/2014   Procedure: ESOPHAGEAL MANOMETRY (EM);  Surgeon: Monique Mayer, MD;  [facility];  Service: Endoscopy;  Laterality: N/A;  . ESOPHAGOGASTRODUODENOSCOPY    . INCISIONAL HERNIA REPAIR N/A 10/24/2015   Procedure: LAPAROSCOPIC REPAIR INCISIONAL HERNIA WITH MESH;  Surgeon: Monique Skeans, MD;  Location: [place];  Service: General;  Laterality: N/A;  . INSERTION OF MESH N/A 10/24/2015   Procedure: INSERTION OF MESH;  Surgeon: Monique Skeans, MD;  Location: [place];  Service: General;  Laterality: N/A;  . JOINT REPLACEMENT    . KNEE SURGERY Right    x4  . [place] IMPEDANCE STUDY N/A 04/10/2014   Procedure: [place] IMPEDANCE STUDY;  Surgeon: Monique Mayer, MD;  [facility];  Service: Endoscopy;  Laterality: N/A;    Family Psychiatric History: See below  Family History:  Family History  Problem Relation Age of Onset  . Alcohol abuse Father   . Dementia Father   . Dementia Maternal Grandmother   . Bipolar disorder Daughter   . Anxiety disorder Daughter   . Depression Brother   . Drug abuse Brother   . Alcohol abuse Paternal Uncle   . Depression Paternal Uncle   . ADD / ADHD Neg  Hx     Social History:  Social History   Socioeconomic History  . Marital status: Widowed    Spouse name: Not on file  . Number of children: 2  . Years of education: 12th grade  . Highest education level: Not on file  Occupational History  . Occupation: RETIRED  Social Needs  . Financial resource strain: Not on file  . Food insecurity:    Worry: Not on file    Inability: Not on file  . Transportation needs:    Medical: Not on file    Non-medical: Not on file  Tobacco Use  . Smoking status: Never Smoker  . Smokeless tobacco: Never Used  Substance and Sexual Activity  . Alcohol use: Yes    Alcohol/week: 1.8 oz    Types: 3 Cans of beer per week    Comment: 2-3 drinks per week  . Drug use: No  . Sexual activity: Never  Lifestyle  . Physical activity:    Days per week: Not on file     Minutes per session: Not on file  . Stress: Not on file  Relationships  . Social connections:    Talks on phone: Not on file    Gets together: Not on file    Attends religious service: Not on file    Active member of club or organization: Not on file    Attends meetings of clubs or organizations: Not on file    Relationship status: Not on file  Other Topics Concern  . Not on file  Social History Narrative  . Not on file    Allergies:  Allergies  Allergen Reactions  . Levothyroxine Hives and Itching  . Tylenol [Acetaminophen] Other (See Comments)    Liver function per MD     Metabolic Disorder Labs: No results found for: HGBA1C, MPG No results found for: PROLACTIN No results found for: CHOL, TRIG, HDL, CHOLHDL, VLDL, LDLCALC Lab Results  Component Value Date   TSH 4.540 (H) 10/28/2007   TSH 1.782 11/03/2006    Therapeutic Level Labs: No results found for: LITHIUM No results found for: VALPROATE No components found for:  CBMZ  Current Medications: Current Outpatient Medications  Medication Sig Dispense Refill  . albuterol (PROVENTIL HFA;VENTOLIN HFA) 108 (90 Base) MCG/ACT inhaler Inhale 2 puffs into the lungs every 6 (six) hours as needed for wheezing or shortness of breath.    Marland Kitchen albuterol (PROVENTIL) (2.5 MG/3ML) 0.083% nebulizer solution Take 2.5 mg by nebulization every 6 (six) hours as needed for wheezing or shortness of breath.    . ALPRAZolam (XANAX) 1 MG tablet Take 1 tablet (1 mg total) by mouth 4 (four) times daily. 120 tablet 2  . aspirin EC 81 MG tablet Take 81 mg by mouth daily.    . Butalbital-APAP-Caffeine 50-300-40 MG CAPS Take 1 tablet by mouth as needed (Migraine).     . celecoxib (CELEBREX) 100 MG capsule Take 100 mg by mouth 2 (two) times daily.  2  . Docusate Calcium (STOOL SOFTENER PO) Take 2 capsules by mouth daily.     Marland Kitchen FLUoxetine (PROZAC) 20 MG capsule Take 1 capsule (20 mg total) by mouth daily. 30 capsule 2  . loratadine (CLARITIN) 10 MG  tablet TAKE ONE TABLET BY MOUTH ONCE DAILY (Patient not taking: Reported on 07/30/2017) 30 tablet 2  . meloxicam (MOBIC) 15 MG tablet TAKE ONE TABLET BY MOUTH DAILY (Patient not taking: Reported on 07/30/2017) 30 tablet 1  . metoCLOPramide (REGLAN) 10 MG tablet  Take 1 tablet every morning with breakfast and at bedtime (Patient not taking: Reported on 07/30/2017) 60 tablet 2  . metoprolol succinate (TOPROL-XL) 50 MG 24 hr tablet Take 25-50 mg by mouth 2 (two) times daily. Take 50 mg every morning Take 25 mg at bedtime    . Multiple Vitamin (MULTI-VITAMIN PO) Take 1 tablet by mouth daily.    Marland Kitchen nystatin (MYCOSTATIN) powder Apply 1 g topically as needed (under breast).   2  . polyethylene glycol powder (MIRALAX) powder Take 17 g by mouth daily. (Patient not taking: Reported on 07/30/2017) 255 g 0  . ranitidine (ZANTAC) 300 MG tablet Take 300 mg by mouth at bedtime.  3  . Simethicone (GAS RELIEF PO) Take 2 capsules by mouth as needed (flatulence).     . temazepam (RESTORIL) 30 MG capsule Take 1 capsule (30 mg total) by mouth at bedtime as needed for sleep. 30 capsule 2   No current facility-administered medications for this visit.      Musculoskeletal: Strength & Muscle Tone: within normal limits Gait & Station: normal Patient leans: N/A  Psychiatric Specialty Exam: Review of Systems  Constitutional: Positive for malaise/fatigue.  Musculoskeletal: Positive for joint pain and myalgias.  All other systems reviewed and are negative.   Blood pressure (!) 145/86, pulse 70, height 5' 3"  (1.6 m), weight 213 lb (96.6 kg), SpO2 96 %.Body mass index is 37.73 kg/m.  General Appearance: Casual and Fairly Groomed  Eye Contact:  Good  Speech:  Clear and Coherent  Volume:  Normal  Mood:  Euthymic  Affect:  Congruent  Thought Process:  Goal Directed  Orientation:  Full (Time, Place, and Person)  Thought Content: WDL   Suicidal Thoughts:  No  Homicidal Thoughts:  No  Memory:  Immediate;   Good Recent;    Good Remote;   NA  Judgement:  Fair  Insight:  Fair  Psychomotor Activity:  Decreased  Concentration:  Concentration: Good and Attention Span: Good  Recall:  Good  Fund of Knowledge: Fair  Language: Good  Akathisia:  No  Handed:  Right  AIMS (if indicated): not done  Assets:  Communication Skills Desire for Improvement Resilience Social Support Talents/Skills  ADL's:  Intact  Cognition: WNL  Sleep:  Good   Screenings:   Assessment and Plan: This patient is a 67 year old female with a history of depression and anxiety.  She has been more fatigued recently but is going to get her thyroid checked.  For now she will continue Prozac 20 mg daily for depression, Restoril 30 mg at bedtime for sleep as needed and Xanax 1 mg 4 times daily for anxiety.  She will return to see me in 3 months   [name], MD 07/30/2017, 3:42 PM

## 2017-10-19 ENCOUNTER — Encounter (HOSPITAL_COMMUNITY): Payer: Self-pay | Admitting: Psychiatry

## 2017-10-19 ENCOUNTER — Ambulatory Visit (INDEPENDENT_AMBULATORY_CARE_PROVIDER_SITE_OTHER): Payer: Medicare Other | Admitting: Psychiatry

## 2017-10-19 VITALS — BP 144/75 | HR 65 | Ht 63.0 in | Wt 207.0 lb

## 2017-10-19 DIAGNOSIS — F331 Major depressive disorder, recurrent, moderate: Secondary | ICD-10-CM | POA: Diagnosis not present

## 2017-10-19 MED ORDER — FLUOXETINE HCL 20 MG PO CAPS: 20.0000 mg | ORAL_CAPSULE | Freq: Every day | ORAL | 2 refills | Status: DC

## 2017-10-19 MED ORDER — TEMAZEPAM 30 MG PO CAPS: 30.0000 mg | ORAL_CAPSULE | Freq: Every evening | ORAL | 2 refills | Status: DC | PRN

## 2017-10-19 MED ORDER — ALPRAZOLAM 1 MG PO TABS: 1.0000 mg | ORAL_TABLET | Freq: Four times a day (QID) | ORAL | 2 refills | Status: DC

## 2017-10-19 NOTE — Progress Notes (Signed)
[facility] MD/PA/NP OP Progress Note  10/19/2017 4:04 PM [name]  MRN:  [id number]  Chief Complaint:  Chief Complaint    Depression; Anxiety; Follow-up     HPI: This patient is a 67 year old widowed white female who lives alone in [place].  Her daughter recently moved out she has a son in [place].  She is on disability.  The patient returns after 3 months for follow-up of treatment for depression and anxiety.  She states that overall she is doing well.  Her left shoulder is hurting and she got a cortisone shot today.  Her daughter finally moved out and got her own place and the patient is excited about this.  She likes having her is placed to herself.  She is also going down to [place] later this month to visit her son and she seems to be in very good spirits about all of this.  Most of the time her mood is good.  She did have a physical and her thyroid tests were "on the borderline by her report.  However she is not on thyroid replacement.  She is sleeping well and her anxiety is under good control. Visit Diagnosis:    ICD-10-CM   1. Major depressive disorder, recurrent episode, moderate (HCC) F33.1     Past Psychiatric History: none  Past Medical History:  Past Medical History:  Diagnosis Date  . Anxiety   . Arthritis   . Asthma   . COPD (chronic obstructive pulmonary disease) ([place])   . Depression   . Diverticulitis 04/2012   [facility]  . GERD (gastroesophageal reflux disease)   . Hepatitis C    history of - received treatment  . History of pneumonia   . Hypertension   . Insomnia   . Migraine headache   . Numbness and tingling in hands   . Shortness of breath dyspnea   . Splenomegaly   . Thyroid disease    "i do not take anything for my thyroid"  . Wears glasses     Past Surgical History:  Procedure Laterality Date  . ABDOMINAL HYSTERECTOMY    . ANKLE SURGERY Right   . COLON SURGERY     "part of color removed"  . COLONOSCOPY    . ESOPHAGEAL MANOMETRY N/A  04/10/2014   Procedure: ESOPHAGEAL MANOMETRY (EM);  Surgeon: Gatha Mayer, MD;  [facility];  Service: Endoscopy;  Laterality: N/A;  . ESOPHAGOGASTRODUODENOSCOPY    . INCISIONAL HERNIA REPAIR N/A 10/24/2015   Procedure: LAPAROSCOPIC REPAIR INCISIONAL HERNIA WITH MESH;  Surgeon: Georganna Skeans, MD;  Location: [facility];  Service: General;  Laterality: N/A;  . INSERTION OF MESH N/A 10/24/2015   Procedure: INSERTION OF MESH;  Surgeon: Georganna Skeans, MD;  Location: [place];  Service: General;  Laterality: N/A;  . JOINT REPLACEMENT    . KNEE SURGERY Right    x4  . [place] IMPEDANCE STUDY N/A 04/10/2014   Procedure: [place] IMPEDANCE STUDY;  Surgeon: Gatha Mayer, MD;  [facility];  Service: Endoscopy;  Laterality: N/A;    Family Psychiatric History: See below  Family History:  Family History  Problem Relation Age of Onset  . Alcohol abuse Father   . Dementia Father   . Dementia Maternal Grandmother   . Bipolar disorder Daughter   . Anxiety disorder Daughter   . Depression Brother   . Drug abuse Brother   . Alcohol abuse Paternal Uncle   . Depression Paternal Uncle   . ADD /  ADHD Neg Hx     Social History:  Social History   Socioeconomic History  . Marital status: Widowed    Spouse name: Not on file  . Number of children: 2  . Years of education: 12th grade  . Highest education level: Not on file  Occupational History  . Occupation: RETIRED  Social Needs  . Financial resource strain: Not on file  . Food insecurity:    Worry: Not on file    Inability: Not on file  . Transportation needs:    Medical: Not on file    Non-medical: Not on file  Tobacco Use  . Smoking status: Never Smoker  . Smokeless tobacco: Never Used  Substance and Sexual Activity  . Alcohol use: Yes    Alcohol/week: 1.8 oz    Types: 3 Cans of beer per week    Comment: 2-3 drinks per week  . Drug use: No  . Sexual activity: Never  Lifestyle  . Physical activity:    Days per week: Not on file     Minutes per session: Not on file  . Stress: Not on file  Relationships  . Social connections:    Talks on phone: Not on file    Gets together: Not on file    Attends religious service: Not on file    Active member of club or organization: Not on file    Attends meetings of clubs or organizations: Not on file    Relationship status: Not on file  Other Topics Concern  . Not on file  Social History Narrative  . Not on file    Allergies:  Allergies  Allergen Reactions  . Levothyroxine Hives and Itching  . Tylenol [Acetaminophen] Other (See Comments)    Liver function per MD     Metabolic Disorder Labs: No results found for: HGBA1C, MPG No results found for: PROLACTIN No results found for: CHOL, TRIG, HDL, CHOLHDL, VLDL, LDLCALC Lab Results  Component Value Date   TSH 4.540 (H) 10/28/2007   TSH 1.782 11/03/2006    Therapeutic Level Labs: No results found for: LITHIUM No results found for: VALPROATE No components found for:  CBMZ  Current Medications: Current Outpatient Medications  Medication Sig Dispense Refill  . albuterol (PROVENTIL HFA;VENTOLIN HFA) 108 (90 Base) MCG/ACT inhaler Inhale 2 puffs into the lungs every 6 (six) hours as needed for wheezing or shortness of breath.    Marland Kitchen albuterol (PROVENTIL) (2.5 MG/3ML) 0.083% nebulizer solution Take 2.5 mg by nebulization every 6 (six) hours as needed for wheezing or shortness of breath.    . ALPRAZolam (XANAX) 1 MG tablet Take 1 tablet (1 mg total) by mouth 4 (four) times daily. 120 tablet 2  . aspirin EC 81 MG tablet Take 81 mg by mouth daily.    . Butalbital-APAP-Caffeine 50-300-40 MG CAPS Take 1 tablet by mouth as needed (Migraine).     . celecoxib (CELEBREX) 100 MG capsule Take 100 mg by mouth 2 (two) times daily.  2  . Docusate Calcium (STOOL SOFTENER PO) Take 2 capsules by mouth daily.     Marland Kitchen loratadine (CLARITIN) 10 MG tablet TAKE ONE TABLET BY MOUTH ONCE DAILY 30 tablet 2  . meloxicam (MOBIC) 15 MG tablet TAKE  ONE TABLET BY MOUTH DAILY 30 tablet 1  . metoCLOPramide (REGLAN) 10 MG tablet Take 1 tablet every morning with breakfast and at bedtime 60 tablet 2  . metoprolol succinate (TOPROL-XL) 50 MG 24 hr tablet Take 25-50 mg by mouth 2 (  two) times daily. Take 50 mg every morning Take 25 mg at bedtime    . Multiple Vitamin (MULTI-VITAMIN PO) Take 1 tablet by mouth daily.    Marland Kitchen nystatin (MYCOSTATIN) powder Apply 1 g topically as needed (under breast).   2  . polyethylene glycol powder (MIRALAX) powder Take 17 g by mouth daily. 255 g 0  . ranitidine (ZANTAC) 300 MG tablet Take 300 mg by mouth at bedtime.  3  . Simethicone (GAS RELIEF PO) Take 2 capsules by mouth as needed (flatulence).     . temazepam (RESTORIL) 30 MG capsule Take 1 capsule (30 mg total) by mouth at bedtime as needed for sleep. 30 capsule 2  . FLUoxetine (PROZAC) 20 MG capsule Take 1 capsule (20 mg total) by mouth daily. 30 capsule 2   No current facility-administered medications for this visit.      Musculoskeletal: Strength & Muscle Tone: within normal limits Gait & Station: normal Patient leans: N/A  Psychiatric Specialty Exam: Review of Systems  Musculoskeletal: Positive for joint pain.  All other systems reviewed and are negative.   Blood pressure (!) 144/75, pulse 65, height 5' 3"  (1.6 m), weight 207 lb (93.9 kg), SpO2 96 %.Body mass index is 36.67 kg/m.  General Appearance: Casual and Fairly Groomed  Eye Contact:  Good  Speech:  Clear and Coherent  Volume:  Normal  Mood:  Euthymic  Affect:  Congruent  Thought Process:  Goal Directed  Orientation:  Full (Time, Place, and Person)  Thought Content: WDL   Suicidal Thoughts:  No  Homicidal Thoughts:  No  Memory:  Immediate;   Good Recent;   Good Remote;   Fair  Judgement:  Fair  Insight:  Fair  Psychomotor Activity:  Decreased  Concentration:  Concentration: Good and Attention Span: Good  Recall:  Good  Fund of Knowledge: Good  Language: Good  Akathisia:  No   Handed:  Right  AIMS (if indicated): not done  Assets:  Communication Skills Desire for Improvement Resilience Social Support Talents/Skills  ADL's:  Intact  Cognition: WNL  Sleep:  Good   Screenings:   Assessment and Plan: This patient is a 67 year old female with a history of depression and anxiety.  She continues to do well on her current regimen.  She will continue fluoxetine 20 mg daily for depression, Xanax 1 mg 4 times daily for anxiety and Restoril 30 mg at bedtime as needed for sleep.  She will return to see me in 3 months   [name], MD 10/19/2017, 4:04 PM

## 2018-01-11 ENCOUNTER — Ambulatory Visit (INDEPENDENT_AMBULATORY_CARE_PROVIDER_SITE_OTHER): Payer: Medicare Other | Admitting: Psychiatry

## 2018-01-11 ENCOUNTER — Encounter (HOSPITAL_COMMUNITY): Payer: Self-pay | Admitting: Psychiatry

## 2018-01-11 VITALS — BP 132/82 | HR 75 | Ht 63.0 in | Wt 217.0 lb

## 2018-01-11 DIAGNOSIS — F331 Major depressive disorder, recurrent, moderate: Secondary | ICD-10-CM

## 2018-01-11 DIAGNOSIS — M25512 Pain in left shoulder: Secondary | ICD-10-CM

## 2018-01-11 DIAGNOSIS — F419 Anxiety disorder, unspecified: Secondary | ICD-10-CM | POA: Diagnosis not present

## 2018-01-11 MED ORDER — FLUOXETINE HCL 20 MG PO CAPS: 20.0000 mg | ORAL_CAPSULE | Freq: Every day | ORAL | 3 refills | Status: DC

## 2018-01-11 MED ORDER — TEMAZEPAM 30 MG PO CAPS: 30.0000 mg | ORAL_CAPSULE | Freq: Every evening | ORAL | 3 refills | Status: DC | PRN

## 2018-01-11 MED ORDER — ALPRAZOLAM 1 MG PO TABS: 1.0000 mg | ORAL_TABLET | Freq: Four times a day (QID) | ORAL | 3 refills | Status: DC

## 2018-01-11 NOTE — Progress Notes (Signed)
[name] OP Progress Note  01/11/2018 8:23 AM MERSADES BARBARO  MRN:  [id number]  Chief Complaint:  Chief Complaint    Depression; Anxiety; Follow-up     HPI: This patient is a 67 year old widowed white female who lives alone in [place].  Her daughter lives nearby and she has a son in [place].  She is on disability.  The patient returns after 3 months for follow-up of treatment for depression and anxiety.  She states that she has seen an orthopedic surgeon regarding her left shoulder and she is going to have to have surgery.  Is hurting her quite a bit and often waking her up from sleep.  According to her it has to be "reconstructed" and she is going to wait and do it after the holidays.  This is upsetting to her but in general her spirits have been pretty good.  She states that if she has a surgery she will probably go to [place] for a while to stay with her son and his wife to care for her.  Her mood has been stable and she denies suicidal ideation or significant panic attacks. Visit Diagnosis:    ICD-10-CM   1. Major depressive disorder, recurrent episode, moderate (HCC) F33.1     Past Psychiatric History: none  Past Medical History:  Past Medical History:  Diagnosis Date  . Anxiety   . Arthritis   . Asthma   . COPD (chronic obstructive pulmonary disease) ([place])   . Depression   . Diverticulitis 04/2012   [facility]  . GERD (gastroesophageal reflux disease)   . Hepatitis C    history of - received treatment  . History of pneumonia   . Hypertension   . Insomnia   . Migraine headache   . Numbness and tingling in hands   . Shortness of breath dyspnea   . Splenomegaly   . Thyroid disease    "i do not take anything for my thyroid"  . Wears glasses     Past Surgical History:  Procedure Laterality Date  . ABDOMINAL HYSTERECTOMY    . ANKLE SURGERY Right   . COLON SURGERY     "part of color removed"  . COLONOSCOPY    . ESOPHAGEAL MANOMETRY N/A 04/10/2014    Procedure: ESOPHAGEAL MANOMETRY (EM);  Surgeon: Gatha Mayer, MD;  [facility];  Service: Endoscopy;  Laterality: N/A;  . ESOPHAGOGASTRODUODENOSCOPY    . INCISIONAL HERNIA REPAIR N/A 10/24/2015   Procedure: LAPAROSCOPIC REPAIR INCISIONAL HERNIA WITH MESH;  Surgeon: Georganna Skeans, MD;  Location: [place];  Service: General;  Laterality: N/A;  . INSERTION OF MESH N/A 10/24/2015   Procedure: INSERTION OF MESH;  Surgeon: Georganna Skeans, MD;  Location: [facility];  Service: General;  Laterality: N/A;  . JOINT REPLACEMENT    . KNEE SURGERY Right    x4  . [place] IMPEDANCE STUDY N/A 04/10/2014   Procedure: [place] IMPEDANCE STUDY;  Surgeon: Gatha Mayer, MD;  [facility];  Service: Endoscopy;  Laterality: N/A;    Family Psychiatric History: See below  Family History:  Family History  Problem Relation Age of Onset  . Alcohol abuse Father   . Dementia Father   . Dementia Maternal Grandmother   . Bipolar disorder Daughter   . Anxiety disorder Daughter   . Depression Brother   . Drug abuse Brother   . Alcohol abuse Paternal Uncle   . Depression Paternal Uncle   . ADD / ADHD Neg Hx  Social History:  Social History   Socioeconomic History  . Marital status: Widowed    Spouse name: Not on file  . Number of children: 2  . Years of education: 12th grade  . Highest education level: Not on file  Occupational History  . Occupation: RETIRED  Social Needs  . Financial resource strain: Not on file  . Food insecurity:    Worry: Not on file    Inability: Not on file  . Transportation needs:    Medical: Not on file    Non-medical: Not on file  Tobacco Use  . Smoking status: Never Smoker  . Smokeless tobacco: Never Used  Substance and Sexual Activity  . Alcohol use: Yes    Alcohol/week: 3.0 standard drinks    Types: 3 Cans of beer per week    Comment: 2-3 drinks per week  . Drug use: No  . Sexual activity: Never  Lifestyle  . Physical activity:    Days per week: Not on file     Minutes per session: Not on file  . Stress: Not on file  Relationships  . Social connections:    Talks on phone: Not on file    Gets together: Not on file    Attends religious service: Not on file    Active member of club or organization: Not on file    Attends meetings of clubs or organizations: Not on file    Relationship status: Not on file  Other Topics Concern  . Not on file  Social History Narrative  . Not on file    Allergies:  Allergies  Allergen Reactions  . Levothyroxine Hives and Itching  . Tylenol [Acetaminophen] Other (See Comments)    Liver function per MD     Metabolic Disorder Labs: No results found for: HGBA1C, MPG No results found for: PROLACTIN No results found for: CHOL, TRIG, HDL, CHOLHDL, VLDL, LDLCALC Lab Results  Component Value Date   TSH 4.540 (H) 10/28/2007   TSH 1.782 11/03/2006    Therapeutic Level Labs: No results found for: LITHIUM No results found for: VALPROATE No components found for:  CBMZ  Current Medications: Current Outpatient Medications  Medication Sig Dispense Refill  . albuterol (PROVENTIL HFA;VENTOLIN HFA) 108 (90 Base) MCG/ACT inhaler Inhale 2 puffs into the lungs every 6 (six) hours as needed for wheezing or shortness of breath.    Marland Kitchen albuterol (PROVENTIL) (2.5 MG/3ML) 0.083% nebulizer solution Take 2.5 mg by nebulization every 6 (six) hours as needed for wheezing or shortness of breath.    . ALPRAZolam (XANAX) 1 MG tablet Take 1 tablet (1 mg total) by mouth 4 (four) times daily. 120 tablet 3  . aspirin EC 81 MG tablet Take 81 mg by mouth daily.    . Butalbital-APAP-Caffeine 50-300-40 MG CAPS Take 1 tablet by mouth as needed (Migraine).     . celecoxib (CELEBREX) 100 MG capsule Take 100 mg by mouth 2 (two) times daily.  2  . Docusate Calcium (STOOL SOFTENER PO) Take 2 capsules by mouth daily.     Marland Kitchen loratadine (CLARITIN) 10 MG tablet TAKE ONE TABLET BY MOUTH ONCE DAILY 30 tablet 2  . meloxicam (MOBIC) 15 MG tablet TAKE  ONE TABLET BY MOUTH DAILY 30 tablet 1  . metoCLOPramide (REGLAN) 10 MG tablet Take 1 tablet every morning with breakfast and at bedtime 60 tablet 2  . metoprolol succinate (TOPROL-XL) 50 MG 24 hr tablet Take 25-50 mg by mouth 2 (two) times daily. Take 50 mg  every morning Take 25 mg at bedtime    . Multiple Vitamin (MULTI-VITAMIN PO) Take 1 tablet by mouth daily.    Marland Kitchen nystatin (MYCOSTATIN) powder Apply 1 g topically as needed (under breast).   2  . polyethylene glycol powder (MIRALAX) powder Take 17 g by mouth daily. 255 g 0  . ranitidine (ZANTAC) 300 MG tablet Take 300 mg by mouth at bedtime.  3  . Simethicone (GAS RELIEF PO) Take 2 capsules by mouth as needed (flatulence).     . temazepam (RESTORIL) 30 MG capsule Take 1 capsule (30 mg total) by mouth at bedtime as needed for sleep. 30 capsule 3  . FLUoxetine (PROZAC) 20 MG capsule Take 1 capsule (20 mg total) by mouth daily. 30 capsule 3  . traMADol (ULTRAM) 50 MG tablet Take 50 mg by mouth 3 (three) times daily as needed. for pain  2   No current facility-administered medications for this visit.      Musculoskeletal: Strength & Muscle Tone: within normal limits Gait & Station: normal Patient leans: N/A  Psychiatric Specialty Exam: Review of Systems  Musculoskeletal: Positive for joint pain.  All other systems reviewed and are negative.   Blood pressure 132/82, pulse 75, height 5' 3"  (1.6 m), weight 217 lb (98.4 kg), SpO2 97 %.Body mass index is 38.44 kg/m.  General Appearance: Casual and Fairly Groomed  Eye Contact:  Good  Speech:  Clear and Coherent  Volume:  Normal  Mood:  Euthymic  Affect:  Congruent  Thought Process:  Goal Directed  Orientation:  Full (Time, Place, and Person)  Thought Content: Rumination   Suicidal Thoughts:  No  Homicidal Thoughts:  No  Memory:  Immediate;   Good Recent;   Good Remote;   Fair  Judgement:  Good  Insight:  Fair  Psychomotor Activity:  Decreased  Concentration:  Concentration: Good  and Attention Span: Good  Recall:  Good  Fund of Knowledge: Fair  Language: Good  Akathisia:  No  Handed:  Right  AIMS (if indicated): not done  Assets:  Communication Skills Desire for Improvement Resilience Social Support Talents/Skills  ADL's:  Intact  Cognition: WNL  Sleep:  Fair   Screenings:   Assessment and Plan: This patient is a 67 year old female with a history of depression and anxiety.  For the most part she is doing pretty well despite her shoulder pain.  She will continue Xanax 1 mg 4 times daily for anxiety, Prozac 20 mg daily for depression and temazepam 30 mg at bedtime only as needed for sleep.  She will return in 4 months.   Monique Spiller, MD 01/11/2018, 8:23 AM

## 2018-04-14 NOTE — Progress Notes (Signed)
03-29-18 Clearance from Dr. Woody Seller on chart

## 2018-04-14 NOTE — Patient Instructions (Addendum)
SRISHTI STRNAD  04/14/2018   Your procedure is scheduled on: 04-22-18    Report to [facility] Main  Entrance    Report to Admitting at 7:38 AM    Call this number if you have problems the morning of surgery [phone]    Remember: Do not eat food or drink liquids :After Midnight.    BRUSH YOUR TEETH MORNING OF SURGERY AND RINSE YOUR MOUTH OUT, NO CHEWING GUM CANDY OR MINTS.     Take these medicines the morning of surgery with A SIP OF WATER: Alprazolam (Xanax), Fluoxetine (Prozac), Metoclopramide (Reglan), and Metoprolol Succinate (Toprol-XL)                                You may not have any metal on your body including hair pins and              piercings  Do not wear jewelry, make-up, lotions, powders or perfumes, deodorant             Do not wear nail polish.  Do not shave  48 hours prior to surgery.                 Do not bring valuables to the hospital. [place].  Contacts, dentures or bridgework may not be worn into surgery.  Leave suitcase in the car. After surgery it may be brought to your room.     Patients discharged the day of surgery will not be allowed to drive home. IF YOU ARE HAVING SURGERY AND GOING HOME THE SAME DAY, YOU MUST HAVE AN ADULT TO DRIVE YOU HOME AND BE WITH YOU FOR 24 HOURS. YOU MAY GO HOME BY TAXI OR UBER OR ORTHERWISE, BUT AN ADULT MUST ACCOMPANY YOU HOME AND STAY WITH YOU FOR 24 HOURS.   Special Instructions: N/A              Please read over the following fact sheets you were given: _____________________________________________________________________             [facility] - Preparing for Surgery Before surgery, you can play an important role.  Because skin is not sterile, your skin needs to be as free of germs as possible.  You can reduce the number of germs on your skin by washing with CHG (chlorahexidine gluconate) soap before surgery.  CHG is an antiseptic  cleaner which kills germs and bonds with the skin to continue killing germs even after washing. Please DO NOT use if you have an allergy to CHG or antibacterial soaps.  If your skin becomes reddened/irritated stop using the CHG and inform your nurse when you arrive at Short Stay. Do not shave (including legs and underarms) for at least 48 hours prior to the first CHG shower.  You may shave your face/neck. Please follow these instructions carefully:  1.  Shower with CHG Soap the night before surgery and the  morning of Surgery.  2.  If you choose to wash your hair, wash your hair first as usual with your  normal  shampoo.  3.  After you shampoo, rinse your hair and body thoroughly to remove the  shampoo.  4.  Use CHG as you would any other liquid soap.  You can apply chg directly  to the skin and wash                       Gently with a scrungie or clean washcloth.  5.  Apply the CHG Soap to your body ONLY FROM THE NECK DOWN.   Do not use on face/ open                           Wound or open sores. Avoid contact with eyes, ears mouth and genitals (private parts).                       Wash face,  Genitals (private parts) with your normal soap.             6.  Wash thoroughly, paying special attention to the area where your surgery  will be performed.  7.  Thoroughly rinse your body with warm water from the neck down.  8.  DO NOT shower/wash with your normal soap after using and rinsing off  the CHG Soap.                9.  Pat yourself dry with a clean towel.            10.  Wear clean pajamas.            11.  Place clean sheets on your bed the night of your first shower and do not  sleep with pets. Day of Surgery : Do not apply any lotions/deodorants the morning of surgery.  Please wear clean clothes to the hospital/surgery center.  FAILURE TO FOLLOW THESE INSTRUCTIONS MAY RESULT IN THE CANCELLATION OF YOUR SURGERY PATIENT  SIGNATURE_________________________________  NURSE SIGNATURE__________________________________  ________________________________________________________________________   Adam Phenix  An incentive spirometer is a tool that can help keep your lungs clear and active. This tool measures how well you are filling your lungs with each breath. Taking long deep breaths may help reverse or decrease the chance of developing breathing (pulmonary) problems (especially infection) following:  A long period of time when you are unable to move or be active. BEFORE THE PROCEDURE   If the spirometer includes an indicator to show your best effort, your nurse or respiratory therapist will set it to a desired goal.  If possible, sit up straight or lean slightly forward. Try not to slouch.  Hold the incentive spirometer in an upright position. INSTRUCTIONS FOR USE  1. Sit on the edge of your bed if possible, or sit up as far as you can in bed or on a chair. 2. Hold the incentive spirometer in an upright position. 3. Breathe out normally. 4. Place the mouthpiece in your mouth and seal your lips tightly around it. 5. Breathe in slowly and as deeply as possible, raising the piston or the ball toward the top of the column. 6. Hold your breath for 3-5 seconds or for as long as possible. Allow the piston or ball to fall to the bottom of the column. 7. Remove the mouthpiece from your mouth and breathe out normally. 8. Rest for a few seconds and repeat Steps 1 through 7 at least 10 times every 1-2 hours when you are awake. Take your time and take a few normal breaths between deep breaths. 9. The spirometer may include an indicator to show  your best effort. Use the indicator as a goal to work toward during each repetition. 10. After each set of 10 deep breaths, practice coughing to be sure your lungs are clear. If you have an incision (the cut made at the time of surgery), support your incision when coughing  by placing a pillow or rolled up towels firmly against it. Once you are able to get out of bed, walk around indoors and cough well. You may stop using the incentive spirometer when instructed by your caregiver.  RISKS AND COMPLICATIONS  Take your time so you do not get dizzy or light-headed.  If you are in pain, you may need to take or ask for pain medication before doing incentive spirometry. It is harder to take a deep breath if you are having pain. AFTER USE  Rest and breathe slowly and easily.  It can be helpful to keep track of a log of your progress. Your caregiver can provide you with a simple table to help with this. If you are using the spirometer at home, follow these instructions: [place] IF:   You are having difficultly using the spirometer.  You have trouble using the spirometer as often as instructed.  Your pain medication is not giving enough relief while using the spirometer.  You develop fever of 100.5 F (38.1 C) or higher. SEEK IMMEDIATE MEDICAL CARE IF:   You cough up bloody sputum that had not been present before.  You develop fever of 102 F (38.9 C) or greater.  You develop worsening pain at or near the incision site. MAKE SURE YOU:   Understand these instructions.  Will watch your condition.  Will get help right away if you are not doing well or get worse. Document Released: 07/14/2006 Document Revised: 05/26/2011 Document Reviewed: 09/14/2006 [facility] Patient Information 2014 [place], [place].   ________________________________________________________________________

## 2018-04-15 ENCOUNTER — Other Ambulatory Visit: Payer: Self-pay

## 2018-04-15 ENCOUNTER — Encounter (HOSPITAL_COMMUNITY): Payer: Self-pay

## 2018-04-15 ENCOUNTER — Encounter (HOSPITAL_COMMUNITY)
Admission: RE | Admit: 2018-04-15 | Discharge: 2018-04-15 | Disposition: A | Discharge status: Home / Self Care | Payer: Medicare Other | Source: Ambulatory Visit | Attending: Orthopedic Surgery | Admitting: Orthopedic Surgery

## 2018-04-15 DIAGNOSIS — R9431 Abnormal electrocardiogram [ECG] [EKG]: Secondary | ICD-10-CM | POA: Diagnosis not present

## 2018-04-15 DIAGNOSIS — M19012 Primary osteoarthritis, left shoulder: Secondary | ICD-10-CM | POA: Insufficient documentation

## 2018-04-15 DIAGNOSIS — I10 Essential (primary) hypertension: Secondary | ICD-10-CM | POA: Diagnosis not present

## 2018-04-15 DIAGNOSIS — I4510 Unspecified right bundle-branch block: Secondary | ICD-10-CM | POA: Diagnosis not present

## 2018-04-15 DIAGNOSIS — Z01818 Encounter for other preprocedural examination: Secondary | ICD-10-CM | POA: Diagnosis present

## 2018-04-15 LAB — CBC
HCT: 37.3 % (ref 36.0–46.0)
Hemoglobin: 12.5 g/dL (ref 12.0–15.0)
MCH: 33.6 pg (ref 26.0–34.0)
MCHC: 33.5 g/dL (ref 30.0–36.0)
MCV: 100.3 fL — ABNORMAL HIGH (ref 80.0–100.0)
Platelets: 138 10*3/uL — ABNORMAL LOW (ref 150–400)
RBC: 3.72 MIL/uL — ABNORMAL LOW (ref 3.87–5.11)
RDW: 13.5 % (ref 11.5–15.5)
WBC: 6.3 10*3/uL (ref 4.0–10.5)
nRBC: 0 % (ref 0.0–0.2)

## 2018-04-15 LAB — BASIC METABOLIC PANEL
Anion gap: 8 (ref 5–15)
BUN: 16 mg/dL (ref 8–23)
CO2: 23 mmol/L (ref 22–32)
CREATININE: 0.73 mg/dL (ref 0.44–1.00)
Calcium: 9.6 mg/dL (ref 8.9–10.3)
Chloride: 106 mmol/L (ref 98–111)
GFR calc Af Amer: >60 mL/min (ref >60)
GFR calc non Af Amer: >60 mL/min (ref >60)
Glucose, Bld: 104 mg/dL — ABNORMAL HIGH (ref 70–99)
Potassium: 4.4 mmol/L (ref 3.5–5.1)
Sodium: 137 mmol/L (ref 135–145)

## 2018-04-16 LAB — SURGICAL PCR SCREEN
MRSA, PCR: POSITIVE — AB
Staphylococcus aureus: POSITIVE — AB

## 2018-04-16 NOTE — Progress Notes (Signed)
PCP: Dr. Jerene Bears  CARDIOLOGIST: None  INFO IN Epic: 04-15-18 LABS, and EKG  INFO ON CHART: Clearance from Dr. Woody Seller  BLOOD THINNERS AND LAST DOSES: ASA, Last Dose 04-12-18  ____________________________________  PATIENT SYMPTOMS AT TIME OF PREOP: None

## 2018-04-16 NOTE — Progress Notes (Signed)
04-15-18 PCR result routed to Dr. Susie Cassette office for review

## 2018-04-19 ENCOUNTER — Other Ambulatory Visit (HOSPITAL_COMMUNITY): Payer: Self-pay

## 2018-04-19 NOTE — Anesthesia Preprocedure Evaluation (Addendum)
Anesthesia Evaluation  Patient identified by MRN, date of birth, ID band Patient awake    Reviewed: Allergy & Precautions, NPO status , Patient's Chart, lab work & pertinent test results, reviewed documented beta blocker date and time   History of Anesthesia Complications Negative for: history of anesthetic complications  Airway Mallampati: II  TM Distance: >3 FB Neck ROM: Full    Dental  (+) Edentulous Upper, Dental Advisory Given   Pulmonary asthma , COPD,    Pulmonary exam normal        Cardiovascular hypertension, Pt. on medications and Pt. on home beta blockers negative cardio ROS Normal cardiovascular exam     Neuro/Psych PSYCHIATRIC DISORDERS Anxiety Depression negative neurological ROS     GI/Hepatic Neg liver ROS, GERD  ,  Endo/Other  negative endocrine ROS  Renal/GU negative Renal ROS  negative genitourinary   Musculoskeletal negative musculoskeletal ROS (+)   Abdominal   Peds negative pediatric ROS (+)  Hematology negative hematology ROS (+)   Anesthesia Other Findings   Reproductive/Obstetrics negative OB ROS                           Anesthesia Physical Anesthesia Plan  ASA: III  Anesthesia Plan: General   Post-op Pain Management:  Regional for Post-op pain   Induction: Intravenous  PONV Risk Score and Plan: 3 and Ondansetron, Dexamethasone and Scopolamine patch - Pre-op  Airway Management Planned: Oral ETT  Additional Equipment:   Intra-op Plan:   Post-operative Plan:   Informed Consent: I have reviewed the patients History and Physical, chart, labs and discussed the procedure including the risks, benefits and alternatives for the proposed anesthesia with the patient or authorized representative who has indicated his/her understanding and acceptance.     Dental advisory given  Plan Discussed with: CRNA and Anesthesiologist  Anesthesia Plan Comments: (See  PST note 04/15/2018, Konrad Felix, PA-C)      Anesthesia Quick Evaluation

## 2018-04-19 NOTE — Progress Notes (Signed)
Anesthesia Chart Review   Case:  [id number] Date/Time:  04/22/18 7564   Procedure:  Left total shoulder arthroplasty, possible reverse arthroplasty (Left ) - 174mn   Anesthesia type:  General   Pre-op diagnosis:  Left shoulder osteoarthritis   Location:  [facility] ROOM 07 / [facility]   Surgeon:  SJustice Britain MD      DISCUSSION:67 yo never smoker with h/o depression, anxiety, HTN, COPD, GERD, migraine HA, asthma, Hepatitis C (treated), left shoulder OA scheduled for above surgery on 04/25/2018 with Dr. KJustice Britain  Clearance received from PCP, Dr. DJerene Bears on 03/29/2018.  Per his note, "pt is cleared for surgery with the following recommendations; low risk for cardiac complication, BP med with sip of water, nebulizer treatment peri-operatively and incentive spirometry."  Pt can proceed with planned procedure barring acute status change.  VS: BP (!) 142/80   Pulse 75   Temp 36.7 C (Oral)   Resp 18   Ht 5' 3"  (1.6 m)   Wt 101.8 kg   SpO2 99%   BMI 39.75 kg/m   PROVIDERS: VGlenda Chroman MD is PCP    LABS: Labs reviewed: Acceptable for surgery. (all labs ordered are listed, but only abnormal results are displayed)  Labs Reviewed  SURGICAL PCR SCREEN - Abnormal; Notable for the following components:      Result Value   MRSA, PCR POSITIVE (*)    Staphylococcus aureus POSITIVE (*)    All other components within normal limits  BASIC METABOLIC PANEL - Abnormal; Notable for the following components:   Glucose, Bld 104 (*)    All other components within normal limits  CBC - Abnormal; Notable for the following components:   RBC 3.72 (*)    MCV 100.3 (*)    Platelets 138 (*)    All other components within normal limits     IMAGES:   EKG: 04/15/2018 Rate 70 bpm Sinus rhythm with 1st degree AV block with occasional PVCs Incomplete RBBB Nonspecific T wave abdnormality Abnormal ECG Similar to previous EKG tracings   CV: Echo 10/09/2004 SUMMARY - The left ventricle was  mildly dilated. Overall left ventricular    systolic function was normal. Left ventricular ejection    fraction was estimated , range being 55 % to 65 %.. Left    ventricular wall thickness was mildly to moderately    increased. - The aortic valve was mildly calcified. - There was mild aortic root dilatation. - The left atrium was mildly dilated. Past Medical History:  Diagnosis Date  . Anxiety   . Arthritis   . Asthma   . [place]   . Depression   . [facility] . GERD (gastroesophageal reflux disease)   . Hepatitis C    history of - received treatment  . History of pneumonia   . Hypertension   . Insomnia   . Migraine headache   . Numbness and tingling in hands   . Shortness of breath dyspnea   . Splenomegaly   . Thyroid disease    "i do not take anything for my thyroid"  . Wears glasses     Past Surgical History:  Procedure Laterality Date  . ABDOMINAL HYSTERECTOMY    . ANKLE SURGERY Right   . COLON SURGERY     "part of color removed"  . COLONOSCOPY    . ESOPHAGEAL MANOMETRY N/A 04/10/2014   Procedure: ESOPHAGEAL MANOMETRY (EM);  Surgeon: CGlendell Docker  Simonne Maffucci, MD;  Location: [facility];  Service: Endoscopy;  Laterality: N/A;  . ESOPHAGOGASTRODUODENOSCOPY    . INCISIONAL HERNIA REPAIR N/A 10/24/2015   Procedure: LAPAROSCOPIC REPAIR INCISIONAL HERNIA WITH MESH;  Surgeon: Georganna Skeans, MD;  Location: [place];  Service: General;  Laterality: N/A;  . INSERTION OF MESH N/A 10/24/2015   Procedure: INSERTION OF MESH;  Surgeon: Georganna Skeans, MD;  Location: [place];  Service: General;  Laterality: N/A;  . JOINT REPLACEMENT    . KNEE SURGERY Right    x4  . [place] IMPEDANCE STUDY N/A 04/10/2014   Procedure: [place] IMPEDANCE STUDY;  Surgeon: Gatha Mayer, MD;  [facility];  Service: Endoscopy;  Laterality: N/A;    MEDICATIONS: . albuterol (PROVENTIL HFA;VENTOLIN HFA) 108 (90 Base)  MCG/ACT inhaler  . albuterol (PROVENTIL) (2.5 MG/3ML) 0.083% nebulizer solution  . ALPRAZolam (XANAX) 1 MG tablet  . aspirin EC 81 MG tablet  . Butalbital-APAP-Caffeine 50-300-40 MG CAPS  . Docusate Calcium (STOOL SOFTENER PO)  . FLUoxetine (PROZAC) 20 MG capsule  . loratadine (CLARITIN) 10 MG tablet  . meloxicam (MOBIC) 15 MG tablet  . metoCLOPramide (REGLAN) 10 MG tablet  . metoprolol succinate (TOPROL-XL) 50 MG 24 hr tablet  . Multiple Vitamin (MULTI-VITAMIN PO)  . nystatin (MYCOSTATIN) powder  . polyethylene glycol powder (MIRALAX) powder  . ranitidine (ZANTAC) 300 MG tablet  . Simethicone (GAS RELIEF PO)  . temazepam (RESTORIL) 30 MG capsule  . traMADol (ULTRAM) 50 MG tablet   No current facility-administered medications for this encounter.     Maia Plan [facility] Pre-Surgical Testing [phone] 04/19/18 2:58 PM

## 2018-04-21 MED ORDER — VANCOMYCIN HCL 10 G IV SOLR
1500.0000 mg | INTRAVENOUS | Status: AC
  Administered 2018-04-22: 1500 mg via INTRAVENOUS
  Filled 2018-04-21: qty 1500

## 2018-04-22 ENCOUNTER — Inpatient Hospital Stay (HOSPITAL_COMMUNITY)
Admission: RE | Admit: 2018-04-22 | Discharge: 2018-04-23 | DRG: 483 | Disposition: A | Discharge status: Home Health | Payer: Medicare Other | Source: Ambulatory Visit | Attending: Orthopedic Surgery | Admitting: Orthopedic Surgery

## 2018-04-22 ENCOUNTER — Inpatient Hospital Stay (HOSPITAL_COMMUNITY): Payer: Medicare Other | Admitting: Physician Assistant

## 2018-04-22 ENCOUNTER — Encounter (HOSPITAL_COMMUNITY)
Admission: RE | Disposition: A | Discharge status: Home Health | Payer: Self-pay | Source: Ambulatory Visit | Attending: Orthopedic Surgery

## 2018-04-22 ENCOUNTER — Inpatient Hospital Stay (HOSPITAL_COMMUNITY): Payer: Medicare Other | Admitting: Anesthesiology

## 2018-04-22 ENCOUNTER — Encounter (HOSPITAL_COMMUNITY): Payer: Self-pay | Admitting: Emergency Medicine

## 2018-04-22 ENCOUNTER — Other Ambulatory Visit: Payer: Self-pay

## 2018-04-22 DIAGNOSIS — K5900 Constipation, unspecified: Secondary | ICD-10-CM | POA: Diagnosis present

## 2018-04-22 DIAGNOSIS — G4700 Insomnia, unspecified: Secondary | ICD-10-CM | POA: Diagnosis present

## 2018-04-22 DIAGNOSIS — I10 Essential (primary) hypertension: Secondary | ICD-10-CM | POA: Diagnosis present

## 2018-04-22 DIAGNOSIS — J449 Chronic obstructive pulmonary disease, unspecified: Secondary | ICD-10-CM | POA: Diagnosis present

## 2018-04-22 DIAGNOSIS — M19012 Primary osteoarthritis, left shoulder: Principal | ICD-10-CM | POA: Diagnosis present

## 2018-04-22 DIAGNOSIS — Z7982 Long term (current) use of aspirin: Secondary | ICD-10-CM | POA: Diagnosis not present

## 2018-04-22 DIAGNOSIS — F419 Anxiety disorder, unspecified: Secondary | ICD-10-CM | POA: Diagnosis present

## 2018-04-22 DIAGNOSIS — Z811 Family history of alcohol abuse and dependence: Secondary | ICD-10-CM | POA: Diagnosis not present

## 2018-04-22 DIAGNOSIS — F329 Major depressive disorder, single episode, unspecified: Secondary | ICD-10-CM | POA: Diagnosis present

## 2018-04-22 DIAGNOSIS — K219 Gastro-esophageal reflux disease without esophagitis: Secondary | ICD-10-CM | POA: Diagnosis present

## 2018-04-22 DIAGNOSIS — Z7951 Long term (current) use of inhaled steroids: Secondary | ICD-10-CM | POA: Diagnosis not present

## 2018-04-22 DIAGNOSIS — E079 Disorder of thyroid, unspecified: Secondary | ICD-10-CM | POA: Diagnosis present

## 2018-04-22 DIAGNOSIS — G43909 Migraine, unspecified, not intractable, without status migrainosus: Secondary | ICD-10-CM | POA: Diagnosis present

## 2018-04-22 DIAGNOSIS — Z818 Family history of other mental and behavioral disorders: Secondary | ICD-10-CM

## 2018-04-22 DIAGNOSIS — Z79899 Other long term (current) drug therapy: Secondary | ICD-10-CM

## 2018-04-22 DIAGNOSIS — Z8701 Personal history of pneumonia (recurrent): Secondary | ICD-10-CM

## 2018-04-22 DIAGNOSIS — Z96612 Presence of left artificial shoulder joint: Secondary | ICD-10-CM

## 2018-04-22 HISTORY — PX: TOTAL SHOULDER ARTHROPLASTY: SHX126

## 2018-04-22 SURGERY — ARTHROPLASTY, SHOULDER, TOTAL
Anesthesia: General | Site: Shoulder | Laterality: Left

## 2018-04-22 MED ORDER — METOPROLOL SUCCINATE ER 25 MG PO TB24
25.0000 mg | ORAL_TABLET | Freq: Every day | ORAL | Status: DC
  Administered 2018-04-22: 25 mg via ORAL
  Filled 2018-04-22: qty 1

## 2018-04-22 MED ORDER — ONDANSETRON HCL 4 MG/2ML IJ SOLN: 4.0000 mg | Freq: Four times a day (QID) | INTRAMUSCULAR | Status: DC | PRN

## 2018-04-22 MED ORDER — POLYETHYLENE GLYCOL 3350 17 G PO PACK: 17.0000 g | PACK | Freq: Every day | ORAL | Status: DC | PRN

## 2018-04-22 MED ORDER — HYDROMORPHONE HCL 1 MG/ML IJ SOLN: 0.5000 mg | INTRAMUSCULAR | Status: DC | PRN

## 2018-04-22 MED ORDER — TRAMADOL HCL 50 MG PO TABS
50.0000 mg | ORAL_TABLET | Freq: Four times a day (QID) | ORAL | Status: DC | PRN
  Administered 2018-04-23: 50 mg via ORAL
  Filled 2018-04-22: qty 1

## 2018-04-22 MED ORDER — LACTATED RINGERS IV SOLN
INTRAVENOUS | Status: DC
  Administered 2018-04-22 (×2): via INTRAVENOUS

## 2018-04-22 MED ORDER — CHLORHEXIDINE GLUCONATE 4 % EX LIQD: 60.0000 mL | Freq: Once | CUTANEOUS | Status: DC

## 2018-04-22 MED ORDER — BISACODYL 5 MG PO TBEC: 5.0000 mg | DELAYED_RELEASE_TABLET | Freq: Every day | ORAL | Status: DC | PRN

## 2018-04-22 MED ORDER — FENTANYL CITRATE (PF) 100 MCG/2ML IJ SOLN
INTRAMUSCULAR | Status: DC | PRN
  Administered 2018-04-22: 100 ug via INTRAVENOUS
  Administered 2018-04-22: 50 ug via INTRAVENOUS

## 2018-04-22 MED ORDER — PROMETHAZINE HCL 25 MG/ML IJ SOLN: 6.2500 mg | INTRAMUSCULAR | Status: DC | PRN

## 2018-04-22 MED ORDER — OXYCODONE HCL 5 MG PO TABS
5.0000 mg | ORAL_TABLET | ORAL | Status: DC | PRN
  Administered 2018-04-22 – 2018-04-23 (×3): 5 mg via ORAL
  Filled 2018-04-22 (×3): qty 1

## 2018-04-22 MED ORDER — FENTANYL CITRATE (PF) 250 MCG/5ML IJ SOLN
INTRAMUSCULAR | Status: AC
  Filled 2018-04-22: qty 5

## 2018-04-22 MED ORDER — DOCUSATE SODIUM 100 MG PO CAPS
100.0000 mg | ORAL_CAPSULE | Freq: Two times a day (BID) | ORAL | Status: DC
  Administered 2018-04-22 – 2018-04-23 (×3): 100 mg via ORAL
  Filled 2018-04-22 (×3): qty 1

## 2018-04-22 MED ORDER — METOCLOPRAMIDE HCL 5 MG PO TABS: 5.0000 mg | ORAL_TABLET | Freq: Three times a day (TID) | ORAL | Status: DC | PRN

## 2018-04-22 MED ORDER — PROPOFOL 10 MG/ML IV BOLUS
INTRAVENOUS | Status: DC | PRN
  Administered 2018-04-22: 140 mg via INTRAVENOUS

## 2018-04-22 MED ORDER — FENTANYL CITRATE (PF) 100 MCG/2ML IJ SOLN
50.0000 ug | INTRAMUSCULAR | Status: DC
  Administered 2018-04-22: 100 ug via INTRAVENOUS
  Filled 2018-04-22: qty 2

## 2018-04-22 MED ORDER — FAMOTIDINE 20 MG PO TABS
20.0000 mg | ORAL_TABLET | Freq: Two times a day (BID) | ORAL | Status: DC
  Administered 2018-04-22 – 2018-04-23 (×3): 20 mg via ORAL
  Filled 2018-04-22 (×3): qty 1

## 2018-04-22 MED ORDER — METOPROLOL SUCCINATE ER 25 MG PO TB24: 25.0000 mg | ORAL_TABLET | Freq: Two times a day (BID) | ORAL | Status: DC

## 2018-04-22 MED ORDER — KETOROLAC TROMETHAMINE 15 MG/ML IJ SOLN
15.0000 mg | Freq: Four times a day (QID) | INTRAMUSCULAR | Status: AC
  Administered 2018-04-22 – 2018-04-23 (×4): 15 mg via INTRAVENOUS
  Filled 2018-04-22 (×4): qty 1

## 2018-04-22 MED ORDER — PHENYLEPHRINE 40 MCG/ML (10ML) SYRINGE FOR IV PUSH (FOR BLOOD PRESSURE SUPPORT)
PREFILLED_SYRINGE | INTRAVENOUS | Status: DC | PRN
  Administered 2018-04-22: 120 ug via INTRAVENOUS

## 2018-04-22 MED ORDER — LIDOCAINE 2% (20 MG/ML) 5 ML SYRINGE
INTRAMUSCULAR | Status: DC | PRN
  Administered 2018-04-22: 60 mg via INTRAVENOUS

## 2018-04-22 MED ORDER — ROCURONIUM BROMIDE 100 MG/10ML IV SOLN
INTRAVENOUS | Status: AC
  Filled 2018-04-22: qty 1

## 2018-04-22 MED ORDER — BUPIVACAINE LIPOSOME 1.3 % IJ SUSP
INTRAMUSCULAR | Status: DC | PRN
  Administered 2018-04-22: 10 mL via PERINEURAL

## 2018-04-22 MED ORDER — MIDAZOLAM HCL 2 MG/2ML IJ SOLN
1.0000 mg | INTRAMUSCULAR | Status: DC
  Administered 2018-04-22 (×2): 1 mg via INTRAVENOUS
  Filled 2018-04-22: qty 2

## 2018-04-22 MED ORDER — METOCLOPRAMIDE HCL 5 MG/ML IJ SOLN: 5.0000 mg | Freq: Three times a day (TID) | INTRAMUSCULAR | Status: DC | PRN

## 2018-04-22 MED ORDER — LACTATED RINGERS IV SOLN
INTRAVENOUS | Status: DC
  Administered 2018-04-22: 22:00:00 via INTRAVENOUS

## 2018-04-22 MED ORDER — EPHEDRINE SULFATE-NACL 50-0.9 MG/10ML-% IV SOSY
PREFILLED_SYRINGE | INTRAVENOUS | Status: DC | PRN
  Administered 2018-04-22: 15 mg via INTRAVENOUS

## 2018-04-22 MED ORDER — METHOCARBAMOL 500 MG IVPB - SIMPLE MED
500.0000 mg | Freq: Four times a day (QID) | INTRAVENOUS | Status: DC | PRN
  Administered 2018-04-22: 500 mg via INTRAVENOUS
  Filled 2018-04-22: qty 50

## 2018-04-22 MED ORDER — ALPRAZOLAM 1 MG PO TABS
1.0000 mg | ORAL_TABLET | Freq: Three times a day (TID) | ORAL | Status: DC | PRN
  Administered 2018-04-23: 1 mg via ORAL
  Filled 2018-04-22 (×2): qty 1

## 2018-04-22 MED ORDER — PANTOPRAZOLE SODIUM 40 MG PO TBEC
40.0000 mg | DELAYED_RELEASE_TABLET | Freq: Every day | ORAL | Status: DC
  Administered 2018-04-22 – 2018-04-23 (×2): 40 mg via ORAL
  Filled 2018-04-22 (×2): qty 1

## 2018-04-22 MED ORDER — ONDANSETRON HCL 4 MG/2ML IJ SOLN
INTRAMUSCULAR | Status: DC | PRN
  Administered 2018-04-22: 4 mg via INTRAVENOUS

## 2018-04-22 MED ORDER — SODIUM CHLORIDE 0.9 % IV SOLN
INTRAVENOUS | Status: DC | PRN
  Administered 2018-04-22: 30 ug/min via INTRAVENOUS

## 2018-04-22 MED ORDER — ALBUTEROL SULFATE (2.5 MG/3ML) 0.083% IN NEBU: 3.0000 mL | INHALATION_SOLUTION | Freq: Four times a day (QID) | RESPIRATORY_TRACT | Status: DC | PRN

## 2018-04-22 MED ORDER — FLUOXETINE HCL 20 MG PO CAPS
20.0000 mg | ORAL_CAPSULE | Freq: Every day | ORAL | Status: DC
  Administered 2018-04-23: 20 mg via ORAL
  Filled 2018-04-22: qty 1

## 2018-04-22 MED ORDER — STERILE WATER FOR IRRIGATION IR SOLN
Status: DC | PRN
  Administered 2018-04-22: 2000 mL

## 2018-04-22 MED ORDER — DIPHENHYDRAMINE HCL 12.5 MG/5ML PO ELIX: 12.5000 mg | ORAL_SOLUTION | ORAL | Status: DC | PRN

## 2018-04-22 MED ORDER — SODIUM CHLORIDE 0.9 % IR SOLN
Status: DC | PRN
  Administered 2018-04-22: 1000 mL

## 2018-04-22 MED ORDER — DEXAMETHASONE SODIUM PHOSPHATE 10 MG/ML IJ SOLN
INTRAMUSCULAR | Status: DC | PRN
  Administered 2018-04-22: 5 mg via INTRAVENOUS

## 2018-04-22 MED ORDER — BUPIVACAINE HCL (PF) 0.5 % IJ SOLN
INTRAMUSCULAR | Status: DC | PRN
  Administered 2018-04-22: 15 mL via PERINEURAL

## 2018-04-22 MED ORDER — METOPROLOL SUCCINATE ER 50 MG PO TB24
50.0000 mg | ORAL_TABLET | Freq: Every day | ORAL | Status: DC
  Administered 2018-04-23: 50 mg via ORAL
  Filled 2018-04-22: qty 1

## 2018-04-22 MED ORDER — MENTHOL 3 MG MT LOZG: 1.0000 | LOZENGE | OROMUCOSAL | Status: DC | PRN

## 2018-04-22 MED ORDER — SUGAMMADEX SODIUM 200 MG/2ML IV SOLN
INTRAVENOUS | Status: DC | PRN
  Administered 2018-04-22: 200 mg via INTRAVENOUS

## 2018-04-22 MED ORDER — OXYCODONE HCL 5 MG PO TABS: 10.0000 mg | ORAL_TABLET | ORAL | Status: DC | PRN

## 2018-04-22 MED ORDER — TRANEXAMIC ACID-NACL 1000-0.7 MG/100ML-% IV SOLN
1000.0000 mg | INTRAVENOUS | Status: AC
  Administered 2018-04-22: 1000 mg via INTRAVENOUS
  Filled 2018-04-22: qty 100

## 2018-04-22 MED ORDER — METHOCARBAMOL 500 MG IVPB - SIMPLE MED
INTRAVENOUS | Status: AC
  Filled 2018-04-22: qty 50

## 2018-04-22 MED ORDER — ASPIRIN EC 81 MG PO TBEC
81.0000 mg | DELAYED_RELEASE_TABLET | Freq: Every day | ORAL | Status: DC
  Administered 2018-04-22 – 2018-04-23 (×2): 81 mg via ORAL
  Filled 2018-04-22: qty 1

## 2018-04-22 MED ORDER — FENTANYL CITRATE (PF) 100 MCG/2ML IJ SOLN: 25.0000 ug | INTRAMUSCULAR | Status: DC | PRN

## 2018-04-22 MED ORDER — ALUM & MAG HYDROXIDE-SIMETH 200-200-20 MG/5ML PO SUSP: 30.0000 mL | ORAL | Status: DC | PRN

## 2018-04-22 MED ORDER — FLEET ENEMA 7-19 GM/118ML RE ENEM: 1.0000 | ENEMA | Freq: Once | RECTAL | Status: DC | PRN

## 2018-04-22 MED ORDER — PROPOFOL 10 MG/ML IV BOLUS
INTRAVENOUS | Status: AC
  Filled 2018-04-22: qty 20

## 2018-04-22 MED ORDER — ALBUTEROL SULFATE (2.5 MG/3ML) 0.083% IN NEBU: 2.5000 mg | INHALATION_SOLUTION | Freq: Four times a day (QID) | RESPIRATORY_TRACT | Status: DC | PRN

## 2018-04-22 MED ORDER — GABAPENTIN 100 MG PO CAPS
100.0000 mg | ORAL_CAPSULE | Freq: Every day | ORAL | Status: DC
  Administered 2018-04-22: 100 mg via ORAL
  Filled 2018-04-22 (×2): qty 1

## 2018-04-22 MED ORDER — TEMAZEPAM 15 MG PO CAPS: 30.0000 mg | ORAL_CAPSULE | Freq: Every evening | ORAL | Status: DC | PRN

## 2018-04-22 MED ORDER — METHOCARBAMOL 500 MG PO TABS
500.0000 mg | ORAL_TABLET | Freq: Four times a day (QID) | ORAL | Status: DC | PRN
  Administered 2018-04-23 (×2): 500 mg via ORAL
  Filled 2018-04-22 (×2): qty 1

## 2018-04-22 MED ORDER — ONDANSETRON HCL 4 MG PO TABS: 4.0000 mg | ORAL_TABLET | Freq: Four times a day (QID) | ORAL | Status: DC | PRN

## 2018-04-22 MED ORDER — ROCURONIUM BROMIDE 10 MG/ML (PF) SYRINGE
PREFILLED_SYRINGE | INTRAVENOUS | Status: DC | PRN
  Administered 2018-04-22: 10 mg via INTRAVENOUS
  Administered 2018-04-22: 50 mg via INTRAVENOUS

## 2018-04-22 MED ORDER — PHENOL 1.4 % MT LIQD: 1.0000 | OROMUCOSAL | Status: DC | PRN

## 2018-04-22 MED ORDER — CEFAZOLIN SODIUM-DEXTROSE 2-4 GM/100ML-% IV SOLN
2.0000 g | INTRAVENOUS | Status: AC
  Administered 2018-04-22: 2 g via INTRAVENOUS
  Filled 2018-04-22: qty 100

## 2018-04-22 SURGICAL SUPPLY — 57 items
BAG ZIPLOCK 12X15 (MISCELLANEOUS) ×3 IMPLANT
BIT DRILL 2.0X128 (BIT) ×2 IMPLANT
BIT DRILL 2.0X128MM (BIT) ×1
BLADE SAW SGTL 83.5X18.5 (BLADE) ×3 IMPLANT
CEMENT BONE DEPUY (Cement) ×3 IMPLANT
COVER SURGICAL LIGHT HANDLE (MISCELLANEOUS) ×3 IMPLANT
COVER WAND RF STERILE (DRAPES) ×3 IMPLANT
DERMABOND ADVANCED (GAUZE/BANDAGES/DRESSINGS) ×2
DERMABOND ADVANCED .7 DNX12 (GAUZE/BANDAGES/DRESSINGS) ×1 IMPLANT
DRAPE ORTHO SPLIT 77X108 STRL (DRAPES) ×4
DRAPE SURG 17X11 SM STRL (DRAPES) ×3 IMPLANT
DRAPE SURG ORHT 6 SPLT 77X108 (DRAPES) ×2 IMPLANT
DRAPE U-SHAPE 47X51 STRL (DRAPES) ×3 IMPLANT
DRESSING AQUACEL AG SP 3.5X10 (GAUZE/BANDAGES/DRESSINGS) ×1 IMPLANT
DRSG AQUACEL AG ADV 3.5X10 (GAUZE/BANDAGES/DRESSINGS) ×3 IMPLANT
DRSG AQUACEL AG SP 3.5X10 (GAUZE/BANDAGES/DRESSINGS) ×3
DURAPREP 26ML APPLICATOR (WOUND CARE) ×6 IMPLANT
ELECT BLADE TIP CTD 4 INCH (ELECTRODE) ×3 IMPLANT
ELECT REM PT RETURN 15FT ADLT (MISCELLANEOUS) ×3 IMPLANT
FACESHIELD WRAPAROUND (MASK) ×9 IMPLANT
GLENOID WITH CLEAT SM (Miscellaneous) ×3 IMPLANT
GLOVE BIO SURGEON STRL SZ7.5 (GLOVE) ×3 IMPLANT
GLOVE BIO SURGEON STRL SZ8 (GLOVE) ×3 IMPLANT
GLOVE SS BIOGEL STRL SZ 7 (GLOVE) ×1 IMPLANT
GLOVE SS BIOGEL STRL SZ 7.5 (GLOVE) ×1 IMPLANT
GLOVE SUPERSENSE BIOGEL SZ 7 (GLOVE) ×2
GLOVE SUPERSENSE BIOGEL SZ 7.5 (GLOVE) ×2
GOWN STRL REUS W/TWL LRG LVL3 (GOWN DISPOSABLE) ×6 IMPLANT
HEAD HUMERAL UNIVERSE 42X17 (Head) ×3 IMPLANT
KIT BASIN OR (CUSTOM PROCEDURE TRAY) ×3 IMPLANT
KIT SET UNIVERSAL (KITS) ×3 IMPLANT
MANIFOLD NEPTUNE II (INSTRUMENTS) ×3 IMPLANT
MARKER SKIN DUAL TIP RULER LAB (MISCELLANEOUS) ×3 IMPLANT
NEEDLE TAPERED W/ NITINOL LOOP (MISCELLANEOUS) ×3 IMPLANT
NS IRRIG 1000ML POUR BTL (IV SOLUTION) ×3 IMPLANT
PACK SHOULDER (CUSTOM PROCEDURE TRAY) ×3 IMPLANT
PASSER SUT SWANSON 36MM LOOP (INSTRUMENTS) ×3 IMPLANT
PROTECTOR NERVE ULNAR (MISCELLANEOUS) ×3 IMPLANT
SLING ARM FOAM STRAP LRG (SOFTGOODS) ×3 IMPLANT
SLING ARM IMMOBILIZER LRG (SOFTGOODS) ×3 IMPLANT
SLING ARM IMMOBILIZER MED (SOFTGOODS) ×3 IMPLANT
SMARTMIX MINI TOWER (MISCELLANEOUS)
SPONGE LAP 4X18 RFD (DISPOSABLE) IMPLANT
STEM APEX UNIVERSAL 6MM SHOULD (Stem) ×3 IMPLANT
SUCTION FRAZIER HANDLE 12FR (TUBING) ×2
SUCTION TUBE FRAZIER 12FR DISP (TUBING) ×1 IMPLANT
SUT FIBERWIRE #2 38 T-5 BLUE (SUTURE) ×6
SUT MNCRL AB 3-0 PS2 18 (SUTURE) ×3 IMPLANT
SUT MON AB 2-0 CT1 36 (SUTURE) ×3 IMPLANT
SUT VIC AB 1 CT1 36 (SUTURE) ×9 IMPLANT
SUTURE FIBERWR #2 38 T-5 BLUE (SUTURE) ×2 IMPLANT
SUTURE TAPE 1.3 40 TPR END (SUTURE) ×3 IMPLANT
SUTURETAPE 1.3 40 TPR END (SUTURE) ×9
TOWEL OR 17X26 10 PK STRL BLUE (TOWEL DISPOSABLE) ×3 IMPLANT
TOWEL OR NON WOVEN STRL DISP B (DISPOSABLE) ×3 IMPLANT
TOWER SMARTMIX MINI (MISCELLANEOUS) IMPLANT
WATER STERILE IRR 1000ML POUR (IV SOLUTION) ×6 IMPLANT

## 2018-04-22 NOTE — H&P (Signed)
[name]    Chief Complaint: Left shoulder osteoarthritis HPI: The patient is a 68 y.o. female with end stage left shoulder OA  Past Medical History:  Diagnosis Date  . Anxiety   . Arthritis   . Asthma   . COPD (chronic obstructive pulmonary disease) ([place])   . Depression   . Diverticulitis 04/2012   [facility]  . GERD (gastroesophageal reflux disease)   . Hepatitis C    history of - received treatment  . History of pneumonia   . Hypertension   . Insomnia   . Migraine headache   . Numbness and tingling in hands   . Shortness of breath dyspnea   . Splenomegaly   . Thyroid disease    "i do not take anything for my thyroid"  . Wears glasses     Past Surgical History:  Procedure Laterality Date  . ABDOMINAL HYSTERECTOMY    . ANKLE SURGERY Right   . COLON SURGERY     "part of color removed"  . COLONOSCOPY    . ESOPHAGEAL MANOMETRY N/A 04/10/2014   Procedure: ESOPHAGEAL MANOMETRY (EM);  Surgeon: Gatha Mayer, MD;  [facility];  Service: Endoscopy;  Laterality: N/A;  . ESOPHAGOGASTRODUODENOSCOPY    . INCISIONAL HERNIA REPAIR N/A 10/24/2015   Procedure: LAPAROSCOPIC REPAIR INCISIONAL HERNIA WITH MESH;  Surgeon: Georganna Skeans, MD;  Location: [place];  Service: General;  Laterality: N/A;  . INSERTION OF MESH N/A 10/24/2015   Procedure: INSERTION OF MESH;  Surgeon: Georganna Skeans, MD;  Location: [place];  Service: General;  Laterality: N/A;  . JOINT REPLACEMENT    . KNEE SURGERY Right    x4  . [place] IMPEDANCE STUDY N/A 04/10/2014   Procedure: [place] IMPEDANCE STUDY;  Surgeon: Gatha Mayer, MD;  [facility];  Service: Endoscopy;  Laterality: N/A;    Family History  Problem Relation Age of Onset  . Alcohol abuse Father   . Dementia Father   . Dementia Maternal Grandmother   . Bipolar disorder Daughter   . Anxiety disorder Daughter   . Depression Brother   . Drug abuse Brother   . Alcohol abuse Paternal Uncle   . Depression Paternal Uncle   .  ADD / ADHD Neg Hx     Social History:  reports that she has never smoked. She has never used smokeless tobacco. She reports previous alcohol use of about 3.0 standard drinks of alcohol per week. She reports that she does not use drugs.   Medications Prior to Admission  Medication Sig Dispense Refill  . albuterol (PROVENTIL HFA;VENTOLIN HFA) 108 (90 Base) MCG/ACT inhaler Inhale 2 puffs into the lungs every 6 (six) hours as needed for wheezing or shortness of breath.    . ALPRAZolam (XANAX) 1 MG tablet Take 1 tablet (1 mg total) by mouth 4 (four) times daily. 120 tablet 3  . aspirin EC 81 MG tablet Take 81 mg by mouth daily.    [name] Calcium (STOOL SOFTENER PO) Take 2 capsules by mouth daily.     Marland Kitchen FLUoxetine (PROZAC) 20 MG capsule Take 1 capsule (20 mg total) by mouth daily. 30 capsule 3  . Fluticasone-Umeclidin-Vilant (TRELEGY ELLIPTA IN) Inhale into the lungs.    . gabapentin (NEURONTIN) 100 MG capsule Take 100 mg by mouth at bedtime.    Marland Kitchen loratadine (CLARITIN) 10 MG tablet TAKE ONE TABLET BY MOUTH ONCE DAILY (Patient taking differently: Take 10 mg by mouth daily. ) 30 tablet 2  .  meloxicam (MOBIC) 15 MG tablet TAKE ONE TABLET BY MOUTH DAILY 30 tablet 1  . metoCLOPramide (REGLAN) 10 MG tablet Take 1 tablet every morning with breakfast and at bedtime (Patient taking differently: Take 10 mg by mouth every 8 (eight) hours as needed for nausea or vomiting. Take 1 tablet every morning with breakfast and at bedtime) 60 tablet 2  . metoprolol succinate (TOPROL-XL) 50 MG 24 hr tablet Take 25-50 mg by mouth 2 (two) times daily. Take 50 mg by mouth in the morning and 25 mg at bedtime    . Multiple Vitamin (MULTI-VITAMIN PO) Take 1 tablet by mouth daily.    Marland Kitchen nystatin (MYCOSTATIN) powder Apply 1 g topically as needed (under breast).   2  . polyethylene glycol powder (MIRALAX) powder Take 17 g by mouth daily. (Patient taking differently: Take 17 g by mouth daily as needed for moderate constipation. )  255 g 0  . ranitidine (ZANTAC) 300 MG tablet Take 300 mg by mouth at bedtime.  3  . Simethicone (GAS RELIEF PO) Take 2 capsules by mouth as needed (flatulence).     . temazepam (RESTORIL) 30 MG capsule Take 1 capsule (30 mg total) by mouth at bedtime as needed for sleep. 30 capsule 3  . traMADol (ULTRAM) 50 MG tablet Take 50 mg by mouth 3 (three) times daily.   2  . albuterol (PROVENTIL) (2.5 MG/3ML) 0.083% nebulizer solution Take 2.5 mg by nebulization every 6 (six) hours as needed for wheezing or shortness of breath.    . Butalbital-APAP-Caffeine 50-300-40 MG CAPS Take 1 tablet by mouth daily as needed (Migraine).        Physical Exam: lefts shoulder with painful and restricted motion as noted at recent office visits  Vitals  Temp:  [98 F (36.7 C)] 98 F (36.7 C) (02/06 0749) Pulse Rate:  [60-71] 71 (02/06 0921) Resp:  [9-17] 16 (02/06 0921) BP: (100-141)/(65-83) 120/74 (02/06 0914) SpO2:  [89 %-100 %] 99 % (02/06 0921) Weight:  [101.8 kg] 101.8 kg (02/06 0800)  Assessment/Plan  Impression: Left shoulder osteoarthritis  Plan of Action: Procedure(s): Left total shoulder arthroplasty, possible reverse arthroplasty  [name] 04/22/2018, 9:25 AM Contact # [phone]

## 2018-04-22 NOTE — Progress Notes (Signed)
Assisted Dr. Tobias Alexander with left, ultrasound guided, interscalene  block. Side rails up, monitors on throughout procedure. See vital signs in flow sheet. Tolerated Procedure well.

## 2018-04-22 NOTE — Anesthesia Postprocedure Evaluation (Signed)
Anesthesia Post Note  Patient: [name]  Procedure(s) Performed: Left total shoulder arthroplasty (Left Shoulder)     Patient location during evaluation: PACU Anesthesia Type: General Level of consciousness: sedated Pain management: pain level controlled Vital Signs Assessment: post-procedure vital signs reviewed and stable Respiratory status: spontaneous breathing and respiratory function stable Cardiovascular status: stable Postop Assessment: no apparent nausea or vomiting Anesthetic complications: no    Last Vitals:  Vitals:   04/22/18 1300 04/22/18 1327  BP: (!) 143/86 (!) 152/102  Pulse: 73 77  Resp: 15 16  Temp:  36.5 C  SpO2: 94% 98%    Last Pain:  Vitals:   04/22/18 1327  TempSrc: Oral  PainSc:                  [name] DANIEL

## 2018-04-22 NOTE — Anesthesia Procedure Notes (Addendum)
Anesthesia Regional Block: Interscalene brachial plexus block   Pre-Anesthetic Checklist: ,, timeout performed, Correct Patient, Correct Site, Correct Laterality, Correct Procedure, Correct Position, site marked, Risks and benefits discussed,  Surgical consent,  Pre-op evaluation,  At surgeon's request and post-op pain management  Laterality: Left  Prep: chloraprep       Needles:  Injection technique: Single-shot  Needle Type: Echogenic Stimulator Needle     Needle Length: 5cm  Needle Gauge: 22     Additional Needles:   Procedures:, nerve stimulator,,,, intact distal pulses,,,   Nerve Stimulator or Paresthesia:  Response: bicep , 0.4 mA,   Additional Responses:   Narrative:  Start time: 04/22/2018 9:04 AM End time: 04/22/2018 9:14 AM Injection made incrementally with aspirations every 5 mL.  Performed by: Personally  Anesthesiologist: Duane Boston, MD  Additional Notes: Functioning IV was confirmed and monitors applied.  A 34m 22ga echogenic arrow stimulator was used. Sterile prep and drape,hand hygiene and sterile gloves were used.Ultrasound guidance: relevant anatomy identified, needle position confirmed, local anesthetic spread visualized around nerve(s)., vascular puncture avoided.  Image printed for medical record.  Negative aspiration and negative test dose prior to incremental administration of local anesthetic. The patient tolerated the procedure well. Difficult visualization, so PNS used for confirmation.

## 2018-04-22 NOTE — Plan of Care (Signed)
  Problem: Education: Goal: Knowledge of General Education information will improve Description Including pain rating scale, medication(s)/side effects and non-pharmacologic comfort measures Outcome: Progressing   Problem: Health Behavior/Discharge Planning: Goal: Ability to manage health-related needs will improve Outcome: Progressing   Problem: Clinical Measurements: Goal: Ability to maintain clinical measurements within normal limits will improve Outcome: Progressing Goal: Will remain free from infection Outcome: Progressing Goal: Diagnostic test results will improve Outcome: Progressing Goal: Respiratory complications will improve Outcome: Progressing Goal: Cardiovascular complication will be avoided Outcome: Progressing   Problem: Activity: Goal: Risk for activity intolerance will decrease Outcome: Progressing   Problem: Nutrition: Goal: Adequate nutrition will be maintained Outcome: Progressing   Problem: Coping: Goal: Level of anxiety will decrease Outcome: Progressing   Problem: Elimination: Goal: Will not experience complications related to bowel motility Outcome: Progressing Goal: Will not experience complications related to urinary retention Outcome: Progressing   Problem: Pain Managment: Goal: General experience of comfort will improve Outcome: Progressing   Problem: Safety: Goal: Ability to remain free from injury will improve Outcome: Progressing   Problem: Skin Integrity: Goal: Risk for impaired skin integrity will decrease Outcome: Progressing   Problem: Education: Goal: Knowledge of the prescribed therapeutic regimen will improve Outcome: Progressing Goal: Understanding of activity limitations/precautions following surgery will improve Outcome: Progressing Goal: Individualized Educational Video(s) Outcome: Progressing   Problem: Activity: Goal: Ability to tolerate increased activity will improve Outcome: Progressing   Problem: Pain  Management: Goal: Pain level will decrease with appropriate interventions Outcome: Progressing

## 2018-04-22 NOTE — Anesthesia Procedure Notes (Signed)
Procedure Name: Intubation Performed by: Priest Lockridge J, CRNA Pre-anesthesia Checklist: Patient identified, Emergency Drugs available, Suction available, Patient being monitored and Timeout performed Patient Re-evaluated:Patient Re-evaluated prior to induction Oxygen Delivery Method: Circle system utilized Preoxygenation: Pre-oxygenation with 100% oxygen Induction Type: IV induction Ventilation: Mask ventilation without difficulty Laryngoscope Size: Mac and 4 Grade View: Grade I Tube type: Oral Tube size: 7.0 mm Number of attempts: 1 Airway Equipment and Method: Stylet Placement Confirmation: ETT inserted through vocal cords under direct vision,  positive ETCO2 and breath sounds checked- equal and bilateral Secured at: 21 cm Tube secured with: Tape Dental Injury: Teeth and Oropharynx as per pre-operative assessment        

## 2018-04-22 NOTE — Op Note (Signed)
04/22/2018  12:06 PM  PATIENT:   [name]  68 y.o. female  PRE-OPERATIVE DIAGNOSIS:  Left shoulder osteoarthritis  POST-OPERATIVE DIAGNOSIS: Same  PROCEDURE: Left shoulder anatomic arthroplasty utilizing a size 6 Arthrex stem with a 42 x 17 eccentric head and a small glenoid  SURGEON:  Phylis Javed, Metta Clines M.D.  ASSISTANTS: Jenetta Loges, PA-C  ANESTHESIA:   General endotracheal and interscalene block with Exparel  EBL: 100 cc  SPECIMEN: None  Drains: None   PATIENT DISPOSITION:  PACU - hemodynamically stable.    PLAN OF CARE: Admit for overnight observation  Brief history:  Ms. Portee has been followed for chronic and progressively increasing left shoulder pain related to end-stage osteoarthritis.  Her plain radiographs confirm complete loss of joint space subchondral sclerosis and marginal osteophytes.  Due to her increasing pain and functional mentation she is brought to the operating this time for planned left total shoulder arthroplasty  Preoperatively and counseled Ms. Eddings regarding treatment options as well as the potential risks versus benefits thereof.  Possible surgical complications were reviewed including bleeding, infection, neurovascular injury, persistent pain, loss of motion, anesthetic complication, failure of the implant, and possible need for additional surgery.  She understands and accepts and agrees with our planned procedure.  Procedure in detail:  After undergoing routine preop evaluation patient received prophylactic antibiotics and an interscalene block with Exparel was established in the holding area by the anesthesia department.  Placed supine on the operating table and underwent the smooth induction of a general endotracheal anesthesia.  Placed into the beachchair position and appropriately padded and protected.  The left shoulder girdle region was sterilely prepped and draped in standard fashion.  Timeout was called.  An anterior deltopectoral  approach left shoulder was made through an 10 cm incision.  Skin flaps were developed dissection carried deeply and electrocautery was used for hemostasis.  The deltopectoral interval was then developed from proximal to distal with a vein taken laterally in the upper centimeter and a half of the pectoralis major tendon was then tenotomized for exposure.  Conjoined tendon mobilized and retracted medially.  Biceps tendon was then tenodesed at the upper border the pectoralis major tendon and unroofed and excised proximally.  We then outlined the lesser tuberosity insertion of the subscapularis and then used an oscillating saw to create a lesser tuberosity osteotomy and then reflected the subscapularis medially with the bone fleck fragment and divided capsular attachments from the anterior and inferior margins of the humeral neck.  The humeral head was then delivered through the wound and we outlined our proposed humeral head resection with the extra medullary guide and this was then completed with an oscillating saw.  Peripheral osteophytes were then removed with a rondure.  The humeral canal was then prepared with hand reaming and then broaching to size 6 which showed excellent fit and fixation.  Size 5 trial with metal cap placed over the cut proximal humeral surface and then we exposed the glenoid with a combination of Fukuda, pitchfork, and stick tongue retractors.  I performed a circumferential labral resection gaining complete visualization the periphery of the glenoid and a guidepin was then directed into the center of the glenoid after we sized for a small.  The glenoid was then reamed to a stable subchondral bony bed our central drill hole placed followed by the superior and inferior peg and slot respectively.  Glenoid was broached and the trial showed excellent fit.  At this point plan was irrigated, cleaned,  and dried.  On the back table we then mixed our cement and this was then introduced into the superior  and inferior peg and slot respectively and the glenoid was then impacted with excellent fit and fixation.  This point we then returned our attention to the proximal humerus.  We threaded a combination of suture tape and FiberWire through the eyelets on the collar of our implant to be used for our LTL repair.  The suture limbs were then shuttled through drill holes in the proximal humeral metaphysis and once the suture limbs were shuttled for the LTO repair with then terminally seated our implant with excellent fit and fixation and the proximal locking screws were tightened.  Trial reductions at this point showed excellent soft tissue balance with the 42 x 17 eccentric head and this was the final selection which was impacted onto the [facility] taper after was cleaned and dried and then a final reduction showed excellent soft tissue balance good shoulder motion and approximate 50% translation of the humeral head on the glenoid.  At this point point we then performed our LTO repair placing our medial suture limbs through the bone tendon junction and then used our standard suture construct for repair of the LTO with a pair of horizontal and a pair of crossing sutures all of which allowed [place] excellent re-apposition of the LTO fragment.  We then repaired the rotator interval with a series of FiberWire and fiber tape figure-of-eight sutures allowing excellent soft tissue reapproximation.  This point the shoulder easily showed 45 degrees of external rotation without excessive tension on the subscap repair.  The wound was then again irrigated and hemostasis was obtained.  The deltopectoral interval was repaired with a series of figure-of-eight #1 Vicryl sutures.  2-0 Monocryl used for the subcu layer and intra-cuticular 3-0 Monocryl for the skin followed by Dermabond and Aquasol dressing left arm was then placed into a sling and the patient was awakened, extubated, and taken to recovery room in stable condition.  Jenetta Loges, PA-C was used as an Environmental consultant throughout this case essential for help with positioning of the patient, positioning of extremity, tissue manipulation, implantation of the prosthesis, wound closure, and intraoperative decision making.  Metta Clines Madsen Riddle MD   Contact # [phone]

## 2018-04-22 NOTE — Transfer of Care (Signed)
Immediate Anesthesia Transfer of Care Note  Patient: [name]  Procedure(s) Performed: Left total shoulder arthroplasty (Left Shoulder)  Patient Location: PACU  Anesthesia Type:General  Level of Consciousness: sedated, patient cooperative and responds to stimulation  Airway & Oxygen Therapy: Patient Spontanous Breathing and Patient connected to face mask oxygen  Post-op Assessment: Report given to RN and Post -op Vital signs reviewed and stable  Post vital signs: Reviewed and stable  Last Vitals:  Vitals Value Taken Time  BP 155/91 04/22/2018 12:21 PM  Temp    Pulse 83 04/22/2018 12:23 PM  Resp 16 04/22/2018 12:23 PM  SpO2 96 % 04/22/2018 12:23 PM  Vitals shown include unvalidated device data.  Last Pain:  Vitals:   04/22/18 0800  TempSrc:   PainSc: 7       Patients Stated Pain Goal: 4 (67/61/95 0932)  Complications: No apparent anesthesia complications

## 2018-04-22 NOTE — Discharge Instructions (Signed)
Metta Clines. Supple, M.D., F.A.A.O.S. Orthopaedic Surgery Specializing in Arthroscopic and Reconstructive Surgery of the Shoulder [phone] [address] [place], [place] [address] - Fax [phone]   POST-OP TOTAL SHOULDER REPLACEMENT INSTRUCTIONS  1. Call the office at [phone] to schedule your first post-op appointment 10-14 days from the date of your surgery.  2. The bandage over your incision is waterproof. You may begin showering with this dressing on. You may leave this dressing on until first follow up appointment within 2 weeks. We prefer you leave this dressing in place until follow up however after 5-7 days if you are having itching or skin irritation and would like to remove it you may do so. Go slow and tug at the borders gently to break the bond the dressing has with the skin. At this point if there is no drainage it is okay to go without a bandage or you may cover it with a light guaze and tape. You can also expect significant bruising around your shoulder that will drift down your arm and into your chest wall. This is very normal and should resolve over several days.   3. Wear your sling/immobilizer at all times except to perform the exercises below or to occasionally let your arm dangle by your side to stretch your elbow. You also need to sleep in your sling immobilizer until instructed otherwise. It is ok to remove your sling if you are sitting in a controlled environment and allow your arm to rest in a position of comfort by your side or on your lap with pillows to give your neck and skin a break from the sling. You may remove it to allow arm to dangle by side to shower. If you are up walking around and when you go to sleep at night you need to wear it.  4. Range of motion to your elbow, wrist, and hand are encouraged 3-5 times daily. Exercise to your hand and fingers helps to reduce swelling you may experience.  5. Utilize ice to the shoulder 3-5 times  minimum a day and additionally if you are experiencing pain.  6. Prescriptions for a pain medication and a muscle relaxant are provided for you. It is recommended that if you are experiencing pain that you pain medication alone is not controlling, add the muscle relaxant along with the pain medication which can give additional pain relief. The first 1-2 days is generally the most severe of your pain and then should gradually decrease. As your pain lessens it is recommended that you decrease your use of the pain medications to an "as needed basis'" only and to always comply with the recommended dosages of the pain medications.  7. Pain medications can produce constipation along with their use. If you experience this, the use of an over the counter stool softener or laxative daily is recommended.   8. For additional questions or concerns, please do not hesitate to call the office. If after hours there is an answering service to forward your concerns to the physician on call.  9.Pain control following an exparel block  To help control your post-operative pain you received a nerve block  performed with Exparel which is a long acting anesthetic (numbing agent) which can provide pain relief and sensations of numbness (and relief of pain) in the operative shoulder and arm for up to 3 days. Sometimes it provides mixed relief, meaning you may still have numbness in certain areas of the arm but can still  be able to move  parts of that arm, hand, and fingers. We recommend that your prescribed pain medications  be used as needed. We do not feel it is necessary to "pre medicate" and "stay ahead" of pain.  Taking narcotic pain medications when you are not having any pain can lead to unnecessary and potentially dangerous side effects.    POST-OP EXERCISES  Pendulum Exercises  Perform pendulum exercises while standing and bending at the waist. Support your uninvolved arm on a table or chair and allow your operated  arm to hang freely. Make sure to do these exercises passively - not using you shoulder muscles.  Repeat 20 times. Do 3 sessions per day.

## 2018-04-23 ENCOUNTER — Other Ambulatory Visit (HOSPITAL_COMMUNITY): Payer: Self-pay | Admitting: Psychiatry

## 2018-04-23 ENCOUNTER — Encounter (HOSPITAL_COMMUNITY): Payer: Self-pay | Admitting: Orthopedic Surgery

## 2018-04-23 MED ORDER — MELOXICAM 15 MG PO TABS: 15.0000 mg | ORAL_TABLET | Freq: Every day | ORAL | 1 refills | Status: DC

## 2018-04-23 MED ORDER — OXYCODONE HCL 5 MG PO TABS: 5.0000 mg | ORAL_TABLET | ORAL | 0 refills | Status: DC | PRN

## 2018-04-23 MED ORDER — TRAMADOL HCL 50 MG PO TABS: 50.0000 mg | ORAL_TABLET | Freq: Four times a day (QID) | ORAL | 2 refills | Status: DC | PRN

## 2018-04-23 NOTE — Care Management Note (Addendum)
Case Management Note  Patient Details  Name: [name] MRN: [id number] Date of Birth: 1951/01/26  Subjective/Objective:         Spoke with patient at bedside. She wanted to use a company called [facility]. Contacted them and left a message for them to contact me. As we talked patient states she is okay with anyone on the list provided. She has all her DME (RW, cane, shower chair, BSC).            Action/Plan: Contacted [facility]-declined, contacted [facility]-declined, contacted [facility]-declined, contacted [facility]-declined, contacted [facility]-declined. Contacted [facility] and they accepted with start of care on 2/10. Patient made aware.   Expected Discharge Date:  04/23/18               Expected Discharge Plan:  [place]  In-House Referral:  NA  Discharge planning Services  CM Consult  Post Acute Care Choice:  Home Health Choice offered to:  Patient  DME Arranged:  N/A DME Agency:  NA  HH Arranged:  RN, PT, OT, Nurse's Aide [place] Agency:  [place]  Status of Service:  Completed, signed off  If discussed at [place] of Stay Meetings, dates discussed:    Additional Comments:  Guadalupe Maple, RN 04/23/2018, 10:13 AM

## 2018-04-23 NOTE — Evaluation (Signed)
Occupational Therapy Evaluation Patient Details Name: [name] MRN: [id number] DOB: Feb 17, 1951 Today's Date: 04/23/2018    History of Present Illness s/p L TSA.  H/o back sxs, COPD, neuropathy in hands   Clinical Impression   This 68 year old female was admitted for the above. At baseline, she is mod I.  Pt lives alone and was very anxious during evaluation, as she is trying to arrange for some help.  She does have an offer from a family member to stay at family's home (but she will still be alone during the day). Completed ADL.  Pt wanted new gown until she knows for certain that she is leaving. Will continue education in acute setting with the goals listed below     Follow Up Recommendations  Follow surgeon's recommendation for DC plan and follow-up therapies;Home health OT(HHPT, HH aide)    Equipment Recommendations  3 in 1 bedside commode(may be able to borrow)    Recommendations for Other Services       Precautions / Restrictions Precautions Precautions: Shoulder Type of Shoulder Precautions: may come out of sling in controlled environment. Can hand to eat, PROM parameters during adls:  ER 20, ABD 45, FF 60.  Can move elbow to fingers Shoulder Interventions: Shoulder sling/immobilizer Restrictions LUE Weight Bearing: Non weight bearing      Mobility Bed Mobility               General bed mobility comments: eob with NT. Pt plans to sleep in recliner  Transfers Overall transfer level: Needs assistance Equipment used: (IV pole) Transfers: Sit to/from Stand Sit to Stand: Min guard         General transfer comment: for safety.  Pt uses a cane at baseline    Balance Overall balance assessment: Mild deficits observed, not formally tested                                         ADL either performed or assessed with clinical judgement   ADL Overall ADL's : Needs assistance/impaired Eating/Feeding: Set up   Grooming: Minimal assistance    Upper Body Bathing: Moderate assistance   Lower Body Bathing: Moderate assistance   Upper Body Dressing : Maximal assistance   Lower Body Dressing: Maximal assistance   Toilet Transfer: Min guard;Ambulation;Comfort height toilet;Grab bars(and IV pole)   Toileting- Clothing Manipulation and Hygiene: Minimal assistance         General ADL Comments: performed ADL after walking to bathroom. Educated on LUE management.  Pt's block is still in effect; decreased motor control.  She is L dominant, so will not benefit from AE beyond long sponge.  Encouraged her to think about staying with family member, who has offered this.  Pt would still be alone during the day. Reviewed precautions/ADL adaptations.     Vision         Perception     Praxis      Pertinent Vitals/Pain Pain Assessment: Faces Faces Pain Scale: Hurts a little bit Pain Location: L shoulder Pain Descriptors / Indicators: Tingling Pain Intervention(s): Limited activity within patient's tolerance;Monitored during session;Premedicated before session;Repositioned(declined further ice)     Hand Dominance Left   Extremity/Trunk Assessment Upper Extremity Assessment Upper Extremity Assessment: LUE deficits/detail LUE Deficits / Details: block in effect. Pt can move fingers           Communication Communication Communication: No  difficulties   Cognition Arousal/Alertness: Awake/alert Behavior During Therapy: Anxious                                   General Comments: needs repetition due to anxiety   General Comments       Exercises     Shoulder Instructions      Home Living Family/patient expects to be discharged to:: Private residence Living Arrangements: Alone Available Help at Discharge: Family(at night)                             Additional Comments: can stay with a relative; will still be alone during the day.  Pt has a tub/shower at home. She believes there is a 3:1  commode that she can borrow in her building      Prior Functioning/Environment Level of Independence: Independent with assistive device(s)        Comments: uses a cane        OT Problem List: Decreased strength;Decreased activity tolerance;Decreased range of motion;Impaired balance (sitting and/or standing);Decreased knowledge of use of DME or AE;Pain;Decreased knowledge of precautions      OT Treatment/Interventions: Self-care/ADL training;Energy conservation;DME and/or AE instruction;Therapeutic activities;Patient/family education;Balance training    OT Goals(Current goals can be found in the care plan section) Acute Rehab OT Goals Patient Stated Goal: return to independence OT Goal Formulation: With patient Time For Goal Achievement: 04/30/18 Potential to Achieve Goals: Good ADL Goals Pt Will Transfer to Toilet: with supervision;ambulating;bedside commode Pt Will Perform Toileting - Clothing Manipulation and hygiene: with supervision;sit to/from stand Pt/caregiver will Perform Home Exercise Program: With written HEP provided(seated pendulums, elbow to finger ROM) Additional ADL Goal #1: pt will release and resecure sling from seated position Additional ADL Goal #2: pt will verbalize precautions, adl adaptations to follow  OT Frequency: Min 2X/week   Barriers to D/C:            Co-evaluation              AM-PAC OT "6 Clicks" Daily Activity     Outcome Measure Help from another person eating meals?: A Little Help from another person taking care of personal grooming?: A Little Help from another person toileting, which includes using toliet, bedpan, or urinal?: A Little Help from another person bathing (including washing, rinsing, drying)?: A Lot Help from another person to put on and taking off regular upper body clothing?: A Lot Help from another person to put on and taking off regular lower body clothing?: A Lot 6 Click Score: 15   End of Session    Activity  Tolerance:   Patient left: in chair;with call bell/phone within reach  OT Visit Diagnosis: Pain;Muscle weakness (generalized) (M62.81);Unsteadiness on feet (R26.81) Pain - Right/Left: Left Pain - part of body: Shoulder                Time: 3254-9826 OT Time Calculation (min): 33 min Charges:  OT General Charges $OT Visit: 1 Visit OT Evaluation $OT Eval Low Complexity: 1 Low OT Treatments $Self Care/Home Management : 8-22 mins  Lesle Chris, OTR/L Acute Rehabilitation Services [phone] [facility] pager [phone] office 04/23/2018  [place] 04/23/2018, 8:37 AM

## 2018-04-23 NOTE — Addendum Note (Signed)
Addendum  created 04/23/18 0626 by Lollie Sails, CRNA   Charge Capture section accepted

## 2018-04-23 NOTE — Discharge Summary (Signed)
PATIENT ID:      [name]  MRN:     [id number] DOB/AGE:    08/17/1950 / 68 y.o.     DISCHARGE SUMMARY  ADMISSION DATE:    04/22/2018 DISCHARGE DATE:    ADMISSION DIAGNOSIS: Left shoulder osteoarthritis Past Medical History:  Diagnosis Date  . Anxiety   . Arthritis   . Asthma   . COPD (chronic obstructive pulmonary disease) ([place])   . Depression   . Diverticulitis 04/2012   [facility]  . GERD (gastroesophageal reflux disease)   . Hepatitis C    history of - received treatment  . History of pneumonia   . Hypertension   . Insomnia   . Migraine headache   . Numbness and tingling in hands   . Shortness of breath dyspnea   . Splenomegaly   . Thyroid disease    "i do not take anything for my thyroid"  . Wears glasses     DISCHARGE DIAGNOSIS:   Active Problems:   Status post total shoulder arthroplasty, left   PROCEDURE: Procedure(s): Left total shoulder arthroplasty on 04/22/2018  CONSULTS:    HISTORY:  See H&P in chart.  HOSPITAL COURSE:  TAHRA HITZEMAN is a 68 y.o. admitted on 04/22/2018 with a diagnosis of Left shoulder osteoarthritis.  They were brought to the operating room on 04/22/2018 and underwent Procedure(s): Left total shoulder arthroplasty.    They were given perioperative antibiotics:  Anti-infectives (From admission, onward)   Start     Dose/Rate Route Frequency Ordered Stop   04/22/18 0745  ceFAZolin (ANCEF) IVPB 2g/100 mL premix     2 g 200 mL/hr over 30 Minutes Intravenous On call to O.R. 04/22/18 7544 04/22/18 1109   04/22/18 0600  vancomycin (VANCOCIN) 1,500 mg in sodium chloride 0.9 % 500 mL IVPB     1,500 mg 250 mL/hr over 120 Minutes Intravenous On call to O.R. 04/21/18 1313 04/22/18 1116    .  Patient underwent the above named procedure and tolerated it well. The following day they were hemodynamically stable and pain was controlled on oral analgesics. They were neurovascularly intact to the operative extremity. OT was ordered and  worked with patient per protocol. They were medically and orthopaedically stable for discharge on .    DIAGNOSTIC STUDIES:  RECENT RADIOGRAPHIC STUDIES :  No results found.  RECENT VITAL SIGNS:   Patient Vitals for the past 24 hrs:  BP Temp Temp src Pulse Resp SpO2  04/23/18 0524 114/72 98.1 F (36.7 C) Oral 86 16 95 %  04/23/18 0139 114/68 97.7 F (36.5 C) Oral 86 18 97 %  04/22/18 2047 130/83 98.4 F (36.9 C) Oral 97 16 98 %  04/22/18 1709 135/79 97.8 F (36.6 C) Oral 90 16 98 %  04/22/18 1524 132/87 98.2 F (36.8 C) Oral 80 16 97 %  04/22/18 1429 103/82 98.3 F (36.8 C) Oral 79 17 100 %  04/22/18 1327 (!) 152/102 97.7 F (36.5 C) Oral 77 16 98 %  04/22/18 1300 (!) 143/86 - - 73 15 94 %  04/22/18 1245 (!) 158/100 - - 75 16 94 %  04/22/18 1230 (!) 152/88 - - 80 15 97 %  04/22/18 1222 (!) 155/91 97.8 F (36.6 C) - 83 13 96 %  04/22/18 0938 (!) 113/96 - - 69 18 97 %  04/22/18 0930 128/75 - - 67 17 97 %  04/22/18 0921 131/76 - - 71 16 99 %  04/22/18 0914 120/74 - -  66 16 99 %  04/22/18 0913 - - - 68 16 99 %  04/22/18 0912 - - - 68 15 99 %  04/22/18 0911 - - - 70 13 99 %  04/22/18 0910 - - - 69 13 98 %  04/22/18 0909 120/83 - - 66 11 98 %  04/22/18 0908 - - - 66 16 99 %  04/22/18 0907 - - - 64 14 98 %  04/22/18 0906 - - - 64 11 100 %  04/22/18 0905 - - - 63 12 100 %  04/22/18 0904 116/71 - - 63 12 98 %  04/22/18 0903 - - - 63 11 98 %  04/22/18 0902 - - - 65 10 97 %  04/22/18 0901 - - - 65 11 95 %  04/22/18 0900 - - - 60 (!) 9 100 %  04/22/18 0859 103/65 - - 65 10 (!) 89 %  04/22/18 0857 - - - 66 10 98 %  04/22/18 0856 - - - 65 17 98 %  04/22/18 0855 - - - 67 15 97 %  04/22/18 0854 100/78 - - 66 15 96 %  .  RECENT EKG RESULTS:    Orders placed or performed during the hospital encounter of 04/15/18  . EKG 12 lead  . EKG 12 lead    DISCHARGE INSTRUCTIONS:    DISCHARGE MEDICATIONS:   Allergies as of 04/23/2018      Reactions   Levothyroxine Hives, Itching    Tylenol [acetaminophen] Other (See Comments)   Liver function per MD       Medication List    STOP taking these medications   temazepam 30 MG capsule Commonly known as:  RESTORIL     TAKE these medications   albuterol 108 (90 Base) MCG/ACT inhaler Commonly known as:  PROVENTIL HFA;VENTOLIN HFA Inhale 2 puffs into the lungs every 6 (six) hours as needed for wheezing or shortness of breath.   albuterol (2.5 MG/3ML) 0.083% nebulizer solution Commonly known as:  PROVENTIL Take 2.5 mg by nebulization every 6 (six) hours as needed for wheezing or shortness of breath.   ALPRAZolam 1 MG tablet Commonly known as:  XANAX Take 1 tablet (1 mg total) by mouth 4 (four) times daily.   aspirin EC 81 MG tablet Take 81 mg by mouth daily.   Butalbital-APAP-Caffeine 50-300-40 MG Caps Take 1 tablet by mouth daily as needed (Migraine).   FLUoxetine 20 MG capsule Commonly known as:  PROZAC Take 1 capsule (20 mg total) by mouth daily.   gabapentin 100 MG capsule Commonly known as:  NEURONTIN Take 100 mg by mouth at bedtime.   GAS RELIEF PO Take 2 capsules by mouth as needed (flatulence).   loratadine 10 MG tablet Commonly known as:  CLARITIN TAKE ONE TABLET BY MOUTH ONCE DAILY   meloxicam 15 MG tablet Commonly known as:  MOBIC Take 1 tablet (15 mg total) by mouth daily.   metoCLOPramide 10 MG tablet Commonly known as:  REGLAN Take 1 tablet every morning with breakfast and at bedtime What changed:    how much to take  how to take this  when to take this  reasons to take this   metoprolol succinate 50 MG 24 hr tablet Commonly known as:  TOPROL-XL Take 25-50 mg by mouth 2 (two) times daily. Take 50 mg by mouth in the morning and 25 mg at bedtime   MULTI-VITAMIN PO Take 1 tablet by mouth daily.   nystatin powder Commonly known as:  MYCOSTATIN/NYSTOP Apply 1 g topically as needed (under breast).   oxyCODONE 5 MG immediate release tablet Commonly known as:   ROXICODONE Take 1 tablet (5 mg total) by mouth every 4 (four) hours as needed for severe pain (ok to fill, aware of standing xanax order).   polyethylene glycol powder powder Commonly known as:  MIRALAX Take 17 g by mouth daily. What changed:    when to take this  reasons to take this   ranitidine 300 MG tablet Commonly known as:  ZANTAC Take 300 mg by mouth at bedtime.   STOOL SOFTENER PO Take 2 capsules by mouth daily.   traMADol 50 MG tablet Commonly known as:  ULTRAM Take 1 tablet (50 mg total) by mouth every 6 (six) hours as needed. What changed:    when to take this  reasons to take this   [place] into the lungs.       FOLLOW UP VISIT:   Follow-up Information    Justice Britain, MD.   Specialty:  Orthopedic Surgery Why:  call to be seen in 10-14 days Contact information: [address] [place] [address] [phone]           DISCHARGE TO: Home   DISCHARGE CONDITION:  Thereasa Parkin Nicanor [place] for Dr. Justice Britain 04/23/2018, 8:23 AM

## 2018-04-23 NOTE — Progress Notes (Signed)
   04/23/18 1200  OT Visit Information  Last OT Received On 04/23/18  Assistance Needed +1  History of Present Illness s/p L TSA.  H/o back sxs, COPD, neuropathy in hands  Precautions  Precautions Shoulder  Type of Shoulder Precautions may come out of sling in controlled environment. Can hand to eat, PROM parameters during adls:  ER 20, ABD 45, FF 60.  Can move elbow to fingers, gentle pendulums  Shoulder Interventions Shoulder sling/immobilizer  Precaution Booklet Issued Yes (comment)  Pain Assessment  Pain Assessment Faces  Faces Pain Scale 2  Pain Location L TRAP  Pain Descriptors / Indicators Sore  Pain Intervention(s) Limited activity within patient's tolerance;Monitored during session;Premedicated before session;Repositioned  Cognition  Arousal/Alertness Awake/alert  Behavior During Therapy WFL for tasks assessed/performed  Overall Cognitive Status Within Functional Limits for tasks assessed  General Comments very pleasant and calm.  Niece will stay with pt initially  Upper Extremity Assessment  LUE Deficits / Details block in effect. Pt can move fingers and wrist flexion gravity eliminated  ADL  Upper Body Dressing  Maximal assistance;Sitting  General ADL Comments Donned button down shirt and sling with max A.  Reviewed shoulder protocol while walking with cane in hall (at min guard level).  No LOB.  Performed gentle pendulum exercises standing.  Pt's block still in effect--unable to use LUE to self feed  Restrictions  LUE Weight Bearing NWB  Exercises  Exercises  (gentle pendulums )  OT - End of Session  Activity Tolerance Patient tolerated treatment well  Patient left in chair;with call bell/phone within reach  Nurse Communication  (finished with Ot)  OT Assessment/Plan  OT Visit Diagnosis Pain;Muscle weakness (generalized) (M62.81);Unsteadiness on feet (R26.81)  Pain - Right/Left Left  Pain - part of body Shoulder  OT Frequency (ACUTE ONLY) Min 2X/week  Follow Up  Recommendations Follow surgeon's recommendation for DC plan and follow-up therapies;Home health OT;Supervision/Assistance - 24 hour (HHPT HH AIDE)  AM-PAC OT "6 Clicks" Daily Activity Outcome Measure (Version 2)  Help from another person eating meals? 3  Help from another person taking care of personal grooming? 3  Help from another person toileting, which includes using toliet, bedpan, or urinal? 3  Help from another person bathing (including washing, rinsing, drying)? 2  Help from another person to put on and taking off regular upper body clothing? 2  Help from another person to put on and taking off regular lower body clothing? 2  6 Click Score 15  OT Goal Progression  Progress towards OT goals Progressing toward goals  OT Time Calculation  OT Start Time (ACUTE ONLY) 1147  OT Stop Time (ACUTE ONLY) 1212  OT Time Calculation (min) 25 min  OT General Charges  $OT Visit 1 Visit  OT Treatments  $Self Care/Home Management  8-22 mins  $Therapeutic Exercise 8-22 mins  [name], OTR/L Acute Rehabilitation Services [phone] [facility] pager [phone] office 04/23/2018

## 2018-05-11 ENCOUNTER — Telehealth (HOSPITAL_COMMUNITY): Payer: Self-pay | Admitting: *Deleted

## 2018-05-11 NOTE — Telephone Encounter (Signed)
Dr Harrington Challenger Patient called requesting refills on her medication's. She had shoulder surgery  04/22/2018.  And next visit 06/10/2018. She has enough medication to last to 05/24/2018

## 2018-05-17 ENCOUNTER — Other Ambulatory Visit (HOSPITAL_COMMUNITY): Payer: Self-pay | Admitting: Psychiatry

## 2018-05-17 MED ORDER — ALPRAZOLAM 1 MG PO TABS: 1.0000 mg | ORAL_TABLET | Freq: Four times a day (QID) | ORAL | 3 refills | Status: DC

## 2018-05-17 MED ORDER — FLUOXETINE HCL 20 MG PO CAPS: 20.0000 mg | ORAL_CAPSULE | Freq: Every day | ORAL | 3 refills | Status: DC

## 2018-05-17 NOTE — Telephone Encounter (Signed)
sent 

## 2018-06-10 ENCOUNTER — Encounter (HOSPITAL_COMMUNITY): Payer: Self-pay | Admitting: Psychiatry

## 2018-06-10 ENCOUNTER — Ambulatory Visit (INDEPENDENT_AMBULATORY_CARE_PROVIDER_SITE_OTHER): Payer: Medicare Other | Admitting: Psychiatry

## 2018-06-10 ENCOUNTER — Other Ambulatory Visit: Payer: Self-pay

## 2018-06-10 DIAGNOSIS — F331 Major depressive disorder, recurrent, moderate: Secondary | ICD-10-CM | POA: Diagnosis not present

## 2018-06-10 DIAGNOSIS — F419 Anxiety disorder, unspecified: Secondary | ICD-10-CM

## 2018-06-10 DIAGNOSIS — Z79899 Other long term (current) drug therapy: Secondary | ICD-10-CM | POA: Diagnosis not present

## 2018-06-10 MED ORDER — ALPRAZOLAM 1 MG PO TABS: 1.0000 mg | ORAL_TABLET | Freq: Four times a day (QID) | ORAL | 3 refills | Status: DC

## 2018-06-10 MED ORDER — FLUOXETINE HCL 20 MG PO CAPS: 20.0000 mg | ORAL_CAPSULE | Freq: Every day | ORAL | 2 refills | Status: DC

## 2018-06-10 MED ORDER — FLUOXETINE HCL 20 MG PO CAPS: 20.0000 mg | ORAL_CAPSULE | Freq: Every day | ORAL | 3 refills | Status: DC

## 2018-06-10 NOTE — Progress Notes (Signed)
Virtual Visit via Telephone Note  I connected with [name] on 06/10/18 at  1:00 PM EDT by telephone and verified that I am speaking with the correct person using two identifiers.   I discussed the limitations, risks, security and privacy concerns of performing an evaluation and management service by telephone and the availability of in person appointments. I also discussed with the patient that there may be a patient responsible charge related to this service. The patient expressed understanding and agreed to proceed    I discussed the assessment and treatment plan with the patient. The patient was provided an opportunity to ask questions and all were answered. The patient agreed with the plan and demonstrated an understanding of the instructions.   The patient was advised to call back or seek an in-person evaluation if the symptoms worsen or if the condition fails to improve as anticipated.  I provided 15 minutes of non-face-to-face time during this encounter.   [name], MD  [facility] MD/PA/NP OP Progress Note  06/10/2018 1:16 PM [name]  MRN:  [id number]  Chief Complaint:  Chief Complaint    Depression; Anxiety; Follow-up     HPI: This patient is a 68 year old widowed white female who lives alone in [place].  Her daughter lives nearby as do her brothers.  She has 1 son in [place].  She is on disability.  The patient is assessed during telephone interview today because of the coronavirus.  She was last seen about 3 months ago.  Since I saw her she has had shoulder surgery on the left shoulder.  She is now going to physical therapy.  She still in a fair amount of pain but it is getting better.  She states that her mood is generally pretty good.  At times she gets a little down but she is "able to talk myself out of it."  Her sleep is not the best because of shoulder pain.  She tries to get outside with her dog every day.  She is mostly just going to physical therapy and  occasionally to the grocery store and then coming home.  She is staying in touch with family through phone.  She denies significant depression or suicidal ideation and feels like the Xanax continues to really help her anxiety Visit Diagnosis:    ICD-10-CM   1. Major depressive disorder, recurrent episode, moderate (HCC) F33.1     Past Psychiatric History: none  Past Medical History:  Past Medical History:  Diagnosis Date  . Anxiety   . Arthritis   . Asthma   . COPD (chronic obstructive pulmonary disease) ([place])   . Depression   . Diverticul[facility] 04/2012   [facility]  . GERD (gastroesophageal reflux disease)   . Hepatitis C    history of - received treatment  . History of pneumonia   . Hypertension   . Insomnia   . Migraine headache   . Numbness and tingling in hands   . Shortness of breath dyspnea   . Splenomegaly   . Thyroid disease    "i do not take anything for my thyroid"  . Wears glasses     Past Surgical History:  Procedure Laterality Date  . ABDOMINAL HYSTERECTOMY    . ANKLE SURGERY Right   . COLON SURGERY     "part of color removed"  . COLONOSCOPY    . ESOPHAGEAL MANOMETRY N/A 04/10/2014   Procedure: ESOPHAGEAL MANOMETRY (EM);  Surgeon: Gatha Mayer, MD;  Location:  [facility];  Service: Endoscopy;  Laterality: N/A;  . ESOPHAGOGASTRODUODENOSCOPY    . INCISIONAL HERNIA REPAIR N/A 10/24/2015   Procedure: LAPAROSCOPIC REPAIR INCISIONAL HERNIA WITH MESH;  Surgeon: Georganna Skeans, MD;  Location: [place];  Service: General;  Laterality: N/A;  . INSERTION OF MESH N/A 10/24/2015   Procedure: INSERTION OF MESH;  Surgeon: Georganna Skeans, MD;  Location: [place];  Service: General;  Laterality: N/A;  . JOINT REPLACEMENT    . KNEE SURGERY Right    x4  . [place] IMPEDANCE STUDY N/A 04/10/2014   Procedure: [place] IMPEDANCE STUDY;  Surgeon: Gatha Mayer, MD;  Location: [facility];  Service: Endoscopy;  Laterality: N/A;  . TOTAL SHOULDER ARTHROPLASTY Left 04/22/2018   Procedure:  Left total shoulder arthroplasty;  Surgeon: Justice Britain, MD;  Location: [facility];  Service: Orthopedics;  Laterality: Left;  124mn    Family Psychiatric History: See below  Family History:  Family History  Problem Relation Age of Onset  . Alcohol abuse Father   . Dementia Father   . Dementia Maternal Grandmother   . Bipolar disorder Daughter   . Anxiety disorder Daughter   . Depression Brother   . Drug abuse Brother   . Alcohol abuse Paternal Uncle   . Depression Paternal Uncle   . ADD / ADHD Neg Hx     Social History:  Social History   Socioeconomic History  . Marital status: Widowed    Spouse name: Not on file  . Number of children: 2  . Years of education: 12th grade  . Highest education level: Not on file  Occupational History  . Occupation: RETIRED  Social Needs  . Financial resource strain: Not on file  . Food insecurity:    Worry: Not on file    Inability: Not on file  . Transportation needs:    Medical: Not on file    Non-medical: Not on file  Tobacco Use  . Smoking status: Never Smoker  . Smokeless tobacco: Never Used  Substance and Sexual Activity  . Alcohol use: Not Currently    Alcohol/week: 3.0 standard drinks    Types: 3 Cans of beer per week    Comment: 2-3 drinks per week  . Drug use: No  . Sexual activity: Never  Lifestyle  . Physical activity:    Days per week: Not on file    Minutes per session: Not on file  . Stress: Not on file  Relationships  . Social connections:    Talks on phone: Not on file    Gets together: Not on file    Attends religious service: Not on file    Active member of club or organization: Not on file    Attends meetings of clubs or organizations: Not on file    Relationship status: Not on file  Other Topics Concern  . Not on file  Social History Narrative  . Not on file    Allergies:  Allergies  Allergen Reactions  . Levothyroxine Hives and Itching  . Tylenol [Acetaminophen] Other (See Comments)    Liver  function per MD     Metabolic Disorder Labs: No results found for: HGBA1C, MPG No results found for: PROLACTIN No results found for: CHOL, TRIG, HDL, CHOLHDL, VLDL, LDLCALC Lab Results  Component Value Date   TSH 4.540 (H) 10/28/2007   TSH 1.782 11/03/2006    Therapeutic Level Labs: No results found for: LITHIUM No results found for: VALPROATE No components found for:  CBMZ  Current Medications: Current Outpatient Medications  Medication Sig Dispense Refill  . albuterol (PROVENTIL HFA;VENTOLIN HFA) 108 (90 Base) MCG/ACT inhaler Inhale 2 puffs into the lungs every 6 (six) hours as needed for wheezing or shortness of breath.    Marland Kitchen albuterol (PROVENTIL) (2.5 MG/3ML) 0.083% nebulizer solution Take 2.5 mg by nebulization every 6 (six) hours as needed for wheezing or shortness of breath.    . ALPRAZolam (XANAX) 1 MG tablet Take 1 tablet (1 mg total) by mouth 4 (four) times daily. 120 tablet 3  . aspirin EC 81 MG tablet Take 81 mg by mouth daily.    . Butalbital-APAP-Caffeine 50-300-40 MG CAPS Take 1 tablet by mouth daily as needed (Migraine).     [name] Calcium (STOOL SOFTENER PO) Take 2 capsules by mouth daily.     Marland Kitchen FLUoxetine (PROZAC) 20 MG capsule Take 1 capsule (20 mg total) by mouth daily for 30 days. 30 capsule 3  . Fluticasone-Umeclidin-Vilant (TRELEGY ELLIPTA IN) Inhale into the lungs.    . gabapentin (NEURONTIN) 100 MG capsule Take 100 mg by mouth at bedtime.    Marland Kitchen loratadine (CLARITIN) 10 MG tablet TAKE ONE TABLET BY MOUTH ONCE DAILY (Patient taking differently: Take 10 mg by mouth daily. ) 30 tablet 2  . meloxicam (MOBIC) 15 MG tablet Take 1 tablet (15 mg total) by mouth daily. 30 tablet 1  . metoCLOPramide (REGLAN) 10 MG tablet Take 1 tablet every morning with breakfast and at bedtime (Patient taking differently: Take 10 mg by mouth every 8 (eight) hours as needed for nausea or vomiting. Take 1 tablet every morning with breakfast and at bedtime) 60 tablet 2  . metoprolol  succinate (TOPROL-XL) 50 MG 24 hr tablet Take 25-50 mg by mouth 2 (two) times daily. Take 50 mg by mouth in the morning and 25 mg at bedtime    . Multiple Vitamin (MULTI-VITAMIN PO) Take 1 tablet by mouth daily.    Marland Kitchen nystatin (MYCOSTATIN) powder Apply 1 g topically as needed (under breast).   2  . oxyCODONE (ROXICODONE) 5 MG immediate release tablet Take 1 tablet (5 mg total) by mouth every 4 (four) hours as needed for severe pain (ok to fill, aware of standing xanax order). 10 tablet 0  . polyethylene glycol powder (MIRALAX) powder Take 17 g by mouth daily. (Patient taking differently: Take 17 g by mouth daily as needed for moderate constipation. ) 255 g 0  . ranitidine (ZANTAC) 300 MG tablet Take 300 mg by mouth at bedtime.  3  . Simethicone (GAS RELIEF PO) Take 2 capsules by mouth as needed (flatulence).     . traMADol (ULTRAM) 50 MG tablet Take 1 tablet (50 mg total) by mouth every 6 (six) hours as needed. 40 tablet 2   No current facility-administered medications for this visit.      Musculoskeletal: Strength & Muscle Tone: Unable to do because of phone interview Gait & Station:  Patient leans:   Psychiatric Specialty Exam: Review of Systems  Musculoskeletal: Positive for joint pain.  All other systems reviewed and are negative.   There were no vitals taken for this visit.There is no height or weight on file to calculate BMI.  General Appearance: NA  Eye Contact:  NA  Speech:  Clear and Coherent  Volume:  Normal  Mood:  Anxious  Affect:  NA  Thought Process:  Goal Directed  Orientation:  Full (Time, Place, and Person)  Thought Content: WDL   Suicidal Thoughts:  No  Homicidal Thoughts:  No  Memory:  Immediate;   Good Recent;   Good Remote;   Fair  Judgement:  Good  Insight:  Fair  Psychomotor Activity:  Decreased  Concentration:  Concentration: Good and Attention Span: Good  Recall:  Good  Fund of Knowledge: Good  Language: Good  Akathisia:  No  Handed:  Right  AIMS  (if indicated): not done  Assets:  Communication Skills Desire for Improvement Resilience Social Support  ADL's:  Intact  Cognition: WNL  Sleep:  Fair   Screenings:   Assessment and Plan: This patient is a 68 year old female with a history of depression and anxiety.  She is doing well despite her recent shoulder surgery.  She will continue Prozac 20 mg daily for depression and Xanax 1 mg 4 times daily for anxiety.  She will return to see me in 3 months   [name], MD 06/10/2018, 1:16 PM

## 2018-08-23 ENCOUNTER — Other Ambulatory Visit (HOSPITAL_COMMUNITY): Payer: Self-pay | Admitting: Psychiatry

## 2018-09-13 ENCOUNTER — Other Ambulatory Visit: Payer: Self-pay

## 2018-09-13 ENCOUNTER — Ambulatory Visit (HOSPITAL_COMMUNITY): Payer: Medicare Other | Admitting: Psychiatry

## 2018-09-14 ENCOUNTER — Encounter (HOSPITAL_COMMUNITY): Payer: Self-pay | Admitting: Psychiatry

## 2018-09-14 ENCOUNTER — Other Ambulatory Visit: Payer: Self-pay

## 2018-09-14 ENCOUNTER — Ambulatory Visit (INDEPENDENT_AMBULATORY_CARE_PROVIDER_SITE_OTHER): Payer: Medicare Other | Admitting: Psychiatry

## 2018-09-14 DIAGNOSIS — F331 Major depressive disorder, recurrent, moderate: Secondary | ICD-10-CM

## 2018-09-14 MED ORDER — TEMAZEPAM 30 MG PO CAPS: 30.0000 mg | ORAL_CAPSULE | Freq: Every evening | ORAL | 3 refills | Status: DC | PRN

## 2018-09-14 MED ORDER — ALPRAZOLAM 1 MG PO TABS: 1.0000 mg | ORAL_TABLET | Freq: Four times a day (QID) | ORAL | 3 refills | Status: DC

## 2018-09-14 MED ORDER — FLUOXETINE HCL 20 MG PO CAPS: 20.0000 mg | ORAL_CAPSULE | Freq: Every day | ORAL | 2 refills | Status: DC

## 2018-09-14 NOTE — Progress Notes (Signed)
Virtual Visit via Telephone Note  I connected with [name] on 09/14/18 at 11:40 AM EDT by telephone and verified that I am speaking with the correct person using two identifiers.   I discussed the limitations, risks, security and privacy concerns of performing an evaluation and management service by telephone and the availability of in person appointments. I also discussed with the patient that there may be a patient responsible charge related to this service. The patient expressed understanding and agreed to proceed.     I discussed the assessment and treatment plan with the patient. The patient was provided an opportunity to ask questions and all were answered. The patient agreed with the plan and demonstrated an understanding of the instructions.   The patient was advised to call back or seek an in-person evaluation if the symptoms worsen or if the condition fails to improve as anticipated.  I provided 15 minutes of non-face-to-face time during this encounter.   [name], MD  [facility] MD/PA/NP OP Progress Note  09/14/2018 11:45 AM [name]  MRN:  [id number]  Chief Complaint:  Chief Complaint    Depression; Anxiety; Follow-up     HPI: This patient is a 68 year old widowed white female who lives alone in [place].  Her daughter lives nearby as to her brothers.  She has 1 son in [place].  She is on disability.  The patient returns after 3 months and is again assessed by telephone because of the coronavirus pandemic.  She states that she is in a lot of pain.  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  She is now on methotrexate and prednisone.  She states the prednisone keeps her awake at night and she has had to go back to taking the Restoril.  Her mood is okay but she just complains about a lot of pain.  She had shoulder surgery a few months ago and is still going to physical therapy for it.  Her family is helping her out with  getting groceries so she does not have to leave the house too much.  She thinks that her medications for depression anxiety are still helpful by her rheumatologist took her off oxycodone in favor of tramadol and is not working as well for pain. Visit Diagnosis:    ICD-10-CM   1. Major depressive disorder, recurrent episode, moderate (HCC)  F33.1     Past Psychiatric History: none  Past Medical History:  Past Medical History:  Diagnosis Date  . Anxiety   . Arthritis   . Asthma   . COPD (chronic obstructive pulmonary disease) ([place])   . Depression   . Diverticulitis 04/2012   [facility]  . GERD (gastroesophageal reflux disease)   . Hepatitis C    history of - received treatment  . History of pneumonia   . Hypertension   . Insomnia   . Migraine headache   . Numbness and tingling in hands   . Shortness of breath dyspnea   . Splenomegaly   . Thyroid disease    "i do not take anything for my thyroid"  . Wears glasses     Past Surgical History:  Procedure Laterality Date  . ABDOMINAL HYSTERECTOMY    . ANKLE SURGERY Right   . COLON SURGERY     "part of color removed"  . COLONOSCOPY    . ESOPHAGEAL MANOMETRY N/A 04/10/2014   Procedure: ESOPHAGEAL MANOMETRY (EM);  Surgeon: Gatha Mayer, MD;  [facility];  Service: Endoscopy;  Laterality: N/A;  . ESOPHAGOGASTRODUODENOSCOPY    . INCISIONAL HERNIA REPAIR N/A 10/24/2015   Procedure: LAPAROSCOPIC REPAIR INCISIONAL HERNIA WITH MESH;  Surgeon: Georganna Skeans, MD;  Location: [place];  Service: General;  Laterality: N/A;  . INSERTION OF MESH N/A 10/24/2015   Procedure: INSERTION OF MESH;  Surgeon: Georganna Skeans, MD;  Location: [place];  Service: General;  Laterality: N/A;  . JOINT REPLACEMENT    . KNEE SURGERY Right    x4  . [place] IMPEDANCE STUDY N/A 04/10/2014   Procedure: [place] IMPEDANCE STUDY;  Surgeon: Gatha Mayer, MD;  [facility];  Service: Endoscopy;  Laterality: N/A;  . TOTAL SHOULDER ARTHROPLASTY Left  04/22/2018   Procedure: Left total shoulder arthroplasty;  Surgeon: Justice Britain, MD;  Location: [facility];  Service: Orthopedics;  Laterality: Left;  169mn    Family Psychiatric History: see below  Family History:  Family History  Problem Relation Age of Onset  . Alcohol abuse Father   . Dementia Father   . Dementia Maternal Grandmother   . Bipolar disorder Daughter   . Anxiety disorder Daughter   . Depression Brother   . Drug abuse Brother   . Alcohol abuse Paternal Uncle   . Depression Paternal Uncle   . ADD / ADHD Neg Hx     Social History:  Social History   Socioeconomic History  . Marital status: Widowed    Spouse name: Not on file  . Number of children: 2  . Years of education: 12th grade  . Highest education level: Not on file  Occupational History  . Occupation: RETIRED  Social Needs  . Financial resource strain: Not on file  . Food insecurity    Worry: Not on file    Inability: Not on file  . Transportation needs    Medical: Not on file    Non-medical: Not on file  Tobacco Use  . Smoking status: Never Smoker  . Smokeless tobacco: Never Used  Substance and Sexual Activity  . Alcohol use: Not Currently    Alcohol/week: 3.0 standard drinks    Types: 3 Cans of beer per week    Comment: 2-3 drinks per week  . Drug use: No  . Sexual activity: Never  Lifestyle  . Physical activity    Days per week: Not on file    Minutes per session: Not on file  . Stress: Not on file  Relationships  . Social [place] phone: Not on file    Gets together: Not on file    Attends religious service: Not on file    Active member of club or organization: Not on file    Attends meetings of clubs or organizations: Not on file    Relationship status: Not on file  Other Topics Concern  . Not on file  Social History Narrative  . Not on file    Allergies:  Allergies  Allergen Reactions  . Levothyroxine Hives and Itching  . Tylenol [Acetaminophen] Other (See  Comments)    Liver function per MD     Metabolic Disorder Labs: No results found for: HGBA1C, MPG No results found for: PROLACTIN No results found for: CHOL, TRIG, HDL, CHOLHDL, VLDL, LDLCALC Lab Results  Component Value Date   TSH 4.540 (H) 10/28/2007   TSH 1.782 11/03/2006    Therapeutic Level Labs: No results found for: LITHIUM No results found for: VALPROATE No components found for:  CBMZ  Current Medications: Current Outpatient Medications  Medication Sig Dispense Refill  . albuterol (PROVENTIL HFA;VENTOLIN HFA) 108 (90 Base) MCG/ACT inhaler Inhale 2 puffs into the lungs every 6 (six) hours as needed for wheezing or shortness of breath.    Marland Kitchen albuterol (PROVENTIL) (2.5 MG/3ML) 0.083% nebulizer solution Take 2.5 mg by nebulization every 6 (six) hours as needed for wheezing or shortness of breath.    . ALPRAZolam (XANAX) 1 MG tablet Take 1 tablet (1 mg total) by mouth 4 (four) times daily. 120 tablet 3  . aspirin EC 81 MG tablet Take 81 mg by mouth daily.    . Butalbital-APAP-Caffeine 50-300-40 MG CAPS Take 1 tablet by mouth daily as needed (Migraine).     [name] Calcium (STOOL SOFTENER PO) Take 2 capsules by mouth daily.     Marland Kitchen FLUoxetine (PROZAC) 20 MG capsule Take 1 capsule (20 mg total) by mouth daily. 30 capsule 2  . Fluticasone-Umeclidin-Vilant (TRELEGY ELLIPTA IN) Inhale into the lungs.    . gabapentin (NEURONTIN) 100 MG capsule Take 100 mg by mouth at bedtime.    Marland Kitchen loratadine (CLARITIN) 10 MG tablet TAKE ONE TABLET BY MOUTH ONCE DAILY (Patient taking differently: Take 10 mg by mouth daily. ) 30 tablet 2  . meloxicam (MOBIC) 15 MG tablet Take 1 tablet (15 mg total) by mouth daily. 30 tablet 1  . metoCLOPramide (REGLAN) 10 MG tablet Take 1 tablet every morning with breakfast and at bedtime (Patient taking differently: Take 10 mg by mouth every 8 (eight) hours as needed for nausea or vomiting. Take 1 tablet every morning with breakfast and at bedtime) 60 tablet 2  .  metoprolol succinate (TOPROL-XL) 50 MG 24 hr tablet Take 25-50 mg by mouth 2 (two) times daily. Take 50 mg by mouth in the morning and 25 mg at bedtime    . Multiple Vitamin (MULTI-VITAMIN PO) Take 1 tablet by mouth daily.    Marland Kitchen nystatin (MYCOSTATIN) powder Apply 1 g topically as needed (under breast).   2  . polyethylene glycol powder (MIRALAX) powder Take 17 g by mouth daily. (Patient taking differently: Take 17 g by mouth daily as needed for moderate constipation. ) 255 g 0  . ranitidine (ZANTAC) 300 MG tablet Take 300 mg by mouth at bedtime.  3  . Simethicone (GAS RELIEF PO) Take 2 capsules by mouth as needed (flatulence).     . temazepam (RESTORIL) 30 MG capsule Take 1 capsule (30 mg total) by mouth at bedtime as needed for sleep. 30 capsule 3  . traMADol (ULTRAM) 50 MG tablet Take 1 tablet (50 mg total) by mouth every 6 (six) hours as needed. 40 tablet 2   No current facility-administered medications for this visit.      Musculoskeletal: Strength & Muscle Tone: decreased Gait & Station: unsteady Patient leans: N/A  Psychiatric Specialty Exam: Review of Systems  Musculoskeletal: Positive for back pain and joint pain.  Neurological: Positive for focal weakness.  All other systems reviewed and are negative.   There were no vitals taken for this visit.There is no height or weight on file to calculate BMI.  General Appearance: NA  Eye Contact:  NA  Speech:  Clear and Coherent  Volume:  Normal  Mood:  Anxious  Affect:  NA  Thought Process:  Goal Directed  Orientation:  Full (Time, Place, and Person)  Thought Content: Rumination   Suicidal Thoughts:  No  Homicidal Thoughts:  No  Memory:  Immediate;   Good Recent;  Good Remote;   Good  Judgement:  Good  Insight:  Fair  Psychomotor Activity:  Decreased  Concentration:  Concentration: Good and Attention Span: Good  Recall:  Good  Fund of Knowledge: Fair  Language: Good  Akathisia:  No  Handed:  Right  AIMS (if indicated):  not done  Assets:  Communication Skills Desire for Improvement Resilience Social Support Talents/Skills  ADL's:  Intact  Cognition: WNL  Sleep:  Fair   Screenings:   Assessment and Plan: This patient is a 68 year old female with a history of depression and anxiety.  She is in a lot more pain particularly in her knees and is trying to struggle through this.  She does feel that her medications have helped.  She will continue Prozac 20 mg daily for depression, Xanax 1 mg 4 times daily for anxiety and Restoril 30 mg as needed for sleep.  She will return to see me in 3 months   [name], MD 09/14/2018, 11:45 AM

## 2018-09-30 ENCOUNTER — Ambulatory Visit (INDEPENDENT_AMBULATORY_CARE_PROVIDER_SITE_OTHER): Payer: Medicare Other | Admitting: Internal Medicine

## 2018-12-17 ENCOUNTER — Other Ambulatory Visit (HOSPITAL_COMMUNITY): Payer: Self-pay | Admitting: Psychiatry

## 2018-12-17 NOTE — Telephone Encounter (Signed)
Pt needs appt

## 2018-12-23 ENCOUNTER — Other Ambulatory Visit (HOSPITAL_COMMUNITY): Payer: Self-pay | Admitting: Psychiatry

## 2019-01-04 NOTE — Telephone Encounter (Signed)
Patient has appt 01/19/2019

## 2019-01-19 ENCOUNTER — Ambulatory Visit (INDEPENDENT_AMBULATORY_CARE_PROVIDER_SITE_OTHER): Payer: Medicare Other | Admitting: Psychiatry

## 2019-01-19 ENCOUNTER — Other Ambulatory Visit: Payer: Self-pay

## 2019-01-19 ENCOUNTER — Encounter (HOSPITAL_COMMUNITY): Payer: Self-pay | Admitting: Psychiatry

## 2019-01-19 DIAGNOSIS — F331 Major depressive disorder, recurrent, moderate: Secondary | ICD-10-CM | POA: Diagnosis not present

## 2019-01-19 MED ORDER — TEMAZEPAM 30 MG PO CAPS: 30.0000 mg | ORAL_CAPSULE | Freq: Every evening | ORAL | 3 refills | Status: DC | PRN

## 2019-01-19 MED ORDER — ALPRAZOLAM 1 MG PO TABS: 1.0000 mg | ORAL_TABLET | Freq: Four times a day (QID) | ORAL | 3 refills | Status: DC

## 2019-01-19 MED ORDER — FLUOXETINE HCL 40 MG PO CAPS: 40.0000 mg | ORAL_CAPSULE | Freq: Every day | ORAL | 2 refills | Status: DC

## 2019-01-19 NOTE — Progress Notes (Signed)
Virtual Visit via Telephone Note  I connected with [name] on 01/19/19 at 10:40 AM EST by telephone and verified that I am speaking with the correct person using two identifiers.   I discussed the limitations, risks, security and privacy concerns of performing an evaluation and management service by telephone and the availability of in person appointments. I also discussed with the patient that there may be a patient responsible charge related to this service. The patient expressed understanding and agreed to proceed.     I discussed the assessment and treatment plan with the patient. The patient was provided an opportunity to ask questions and all were answered. The patient agreed with the plan and demonstrated an understanding of the instructions.   The patient was advised to call back or seek an in-person evaluation if the symptoms worsen or if the condition fails to improve as anticipated.  I provided 15 minutes of non-face-to-face time during this encounter.   [name], MD  [facility] MD/PA/NP OP Progress Note  01/19/2019 10:42 AM [name]  MRN:  [id number]  Chief Complaint:  Chief Complaint    Depression; Anxiety; Follow-up     HPI: This patient is a 68 year old widowed white female who lives alone in [place].  Her daughter and as well as her brothers live nearby.  She has 1 son in [place].  She is on disability.  The patient returns for follow-up after 3 months for depression and anxiety.  She states that she has had some heart palpitations and was put on a heart monitor by her primary physician.  He is now referring her to a cardiologist.  She is concerned about this.  She states that her son's father in [place] passed away from coronavirus and this was very upsetting to her.  Her nephew who is 42 also recently had coronavirus but he is recovering but her brother and his wife have to stay under quarantine for 2 weeks.  For the most part she is just been staying home and  working on crossword puzzles word finder and trying to keep her mind active.  She feels like she is more depressed because of all the stressors in her life.  She denies being suicidal.  She is sleeping well and her anxiety is under good control.  Because of the increased depression I suggested that we increase her Prozac to 40 mg and she agrees Visit Diagnosis:    ICD-10-CM   1. Major depressive disorder, recurrent episode, moderate (HCC)  F33.1     Past Psychiatric History: none  Past Medical History:  Past Medical History:  Diagnosis Date  . Anxiety   . Arthritis   . Asthma   . COPD (chronic obstructive pulmonary disease) ([place])   . Depression   . Diverticulitis 04/2012   [facility]  . GERD (gastroesophageal reflux disease)   . Hepatitis C    history of - received treatment  . History of pneumonia   . Hypertension   . Insomnia   . Migraine headache   . Numbness and tingling in hands   . Shortness of breath dyspnea   . Splenomegaly   . Thyroid disease    "i do not take anything for my thyroid"  . Wears glasses     Past Surgical History:  Procedure Laterality Date  . ABDOMINAL HYSTERECTOMY    . ANKLE SURGERY Right   . COLON SURGERY     "part of color removed"  . COLONOSCOPY    .  ESOPHAGEAL MANOMETRY N/A 04/10/2014   Procedure: ESOPHAGEAL MANOMETRY (EM);  Surgeon: Gatha Mayer, MD;  [facility];  Service: Endoscopy;  Laterality: N/A;  . ESOPHAGOGASTRODUODENOSCOPY    . INCISIONAL HERNIA REPAIR N/A 10/24/2015   Procedure: LAPAROSCOPIC REPAIR INCISIONAL HERNIA WITH MESH;  Surgeon: Georganna Skeans, MD;  Location: [place];  Service: General;  Laterality: N/A;  . INSERTION OF MESH N/A 10/24/2015   Procedure: INSERTION OF MESH;  Surgeon: Georganna Skeans, MD;  Location: [place];  Service: General;  Laterality: N/A;  . JOINT REPLACEMENT    . KNEE SURGERY Right    x4  . [place] IMPEDANCE STUDY N/A 04/10/2014   Procedure: [place] IMPEDANCE STUDY;  Surgeon: Gatha Mayer, MD;   [facility];  Service: Endoscopy;  Laterality: N/A;  . TOTAL SHOULDER ARTHROPLASTY Left 04/22/2018   Procedure: Left total shoulder arthroplasty;  Surgeon: Justice Britain, MD;  [facility];  Service: Orthopedics;  Laterality: Left;  114mn    Family Psychiatric History: see below  Family History:  Family History  Problem Relation Age of Onset  . Alcohol abuse Father   . Dementia Father   . Dementia Maternal Grandmother   . Bipolar disorder Daughter   . Anxiety disorder Daughter   . Depression Brother   . Drug abuse Brother   . Alcohol abuse Paternal Uncle   . Depression Paternal Uncle   . ADD / ADHD Neg Hx     Social History:  Social History   Socioeconomic History  . Marital status: Widowed    Spouse name: Not on file  . Number of children: 2  . Years of education: 12th grade  . Highest education level: Not on file  Occupational History  . Occupation: RETIRED  Social Needs  . Financial resource strain: Not on file  . Food insecurity    Worry: Not on file    Inability: Not on file  . Transportation needs    Medical: Not on file    Non-medical: Not on file  Tobacco Use  . Smoking status: Never Smoker  . Smokeless tobacco: Never Used  Substance and Sexual Activity  . Alcohol use: Not Currently    Alcohol/week: 3.0 standard drinks    Types: 3 Cans of beer per week    Comment: 2-3 drinks per week  . Drug use: No  . Sexual activity: Never  Lifestyle  . Physical activity    Days per week: Not on file    Minutes per session: Not on file  . Stress: Not on file  Relationships  . Social [place] phone: Not on file    Gets together: Not on file    Attends religious service: Not on file    Active member of club or organization: Not on file    Attends meetings of clubs or organizations: Not on file    Relationship status: Not on file  Other Topics Concern  . Not on file  Social History Narrative  . Not on file    Allergies:   Allergies  Allergen Reactions  . Levothyroxine Hives and Itching  . Tylenol [Acetaminophen] Other (See Comments)    Liver function per MD     Metabolic Disorder Labs: No results found for: HGBA1C, MPG No results found for: PROLACTIN No results found for: CHOL, TRIG, HDL, CHOLHDL, VLDL, LDLCALC Lab Results  Component Value Date   TSH 4.540 (H) 10/28/2007   TSH 1.782 11/03/2006    Therapeutic Level  Labs: No results found for: LITHIUM No results found for: VALPROATE No components found for:  CBMZ  Current Medications: Current Outpatient Medications  Medication Sig Dispense Refill  . albuterol (PROVENTIL HFA;VENTOLIN HFA) 108 (90 Base) MCG/ACT inhaler Inhale 2 puffs into the lungs every 6 (six) hours as needed for wheezing or shortness of breath.    Marland Kitchen albuterol (PROVENTIL) (2.5 MG/3ML) 0.083% nebulizer solution Take 2.5 mg by nebulization every 6 (six) hours as needed for wheezing or shortness of breath.    . ALPRAZolam (XANAX) 1 MG tablet Take 1 tablet (1 mg total) by mouth 4 (four) times daily. 120 tablet 3  . aspirin EC 81 MG tablet Take 81 mg by mouth daily.    . Butalbital-APAP-Caffeine 50-300-40 MG CAPS Take 1 tablet by mouth daily as needed (Migraine).     [name] Calcium (STOOL SOFTENER PO) Take 2 capsules by mouth daily.     Marland Kitchen FLUoxetine (PROZAC) 40 MG capsule Take 1 capsule (40 mg total) by mouth daily. 30 capsule 2  . Fluticasone-Umeclidin-Vilant (TRELEGY ELLIPTA IN) Inhale into the lungs.    . folic acid (FOLVITE) 1 MG tablet Take 2 mg by mouth daily.    Marland Kitchen gabapentin (NEURONTIN) 100 MG capsule Take 100 mg by mouth at bedtime.    Marland Kitchen loratadine (CLARITIN) 10 MG tablet TAKE ONE TABLET BY MOUTH ONCE DAILY (Patient taking differently: Take 10 mg by mouth daily. ) 30 tablet 2  . meloxicam (MOBIC) 15 MG tablet Take 1 tablet (15 mg total) by mouth daily. 30 tablet 1  . methotrexate (RHEUMATREX) 2.5 MG tablet TAKE SIX TABLETS BY MOUTH EVERY WEEK. MAX DAILY DOSE SIX TABLETS     . metoCLOPramide (REGLAN) 10 MG tablet Take 1 tablet every morning with breakfast and at bedtime (Patient taking differently: Take 10 mg by mouth every 8 (eight) hours as needed for nausea or vomiting. Take 1 tablet every morning with breakfast and at bedtime) 60 tablet 2  . metoprolol succinate (TOPROL-XL) 50 MG 24 hr tablet Take 25-50 mg by mouth 2 (two) times daily. Take 50 mg by mouth in the morning and 25 mg at bedtime    . Multiple Vitamin (MULTI-VITAMIN PO) Take 1 tablet by mouth daily.    Marland Kitchen nystatin (MYCOSTATIN) powder Apply 1 g topically as needed (under breast).   2  . polyethylene glycol powder (MIRALAX) powder Take 17 g by mouth daily. (Patient taking differently: Take 17 g by mouth daily as needed for moderate constipation. ) 255 g 0  . ranitidine (ZANTAC) 300 MG tablet Take 300 mg by mouth at bedtime.  3  . Simethicone (GAS RELIEF PO) Take 2 capsules by mouth as needed (flatulence).     . temazepam (RESTORIL) 30 MG capsule Take 1 capsule (30 mg total) by mouth at bedtime as needed. for sleep 30 capsule 3  . traMADol (ULTRAM) 50 MG tablet Take 1 tablet (50 mg total) by mouth every 6 (six) hours as needed. 40 tablet 2   No current facility-administered medications for this visit.      Musculoskeletal: Strength & Muscle Tone: decreased Gait & Station: unsteady Patient leans: N/A  Psychiatric Specialty Exam: Review of Systems  Cardiovascular: Positive for palpitations.  Musculoskeletal: Positive for back pain.  Psychiatric/Behavioral: Positive for depression.  All other systems reviewed and are negative.   There were no vitals taken for this visit.There is no height or weight on file to calculate BMI.  General Appearance: NA  Eye Contact:  NA  Speech:  Clear and Coherent  Volume:  Normal  Mood:  Dysphoric  Affect:  NA  Thought Process:  Goal Directed  Orientation:  Full (Time, Place, and Person)  Thought Content: Rumination   Suicidal Thoughts:  No  Homicidal  Thoughts:  No  Memory:  Immediate;   Good Recent;   Good Remote;   Good  Judgement:  Good  Insight:  Good  Psychomotor Activity:  Decreased  Concentration:  Concentration: Good and Attention Span: Good  Recall:  Good  Fund of Knowledge: Good  Language: Good  Akathisia:  No  Handed:  Right  AIMS (if indicated): not done  Assets:  Communication Skills Desire for Improvement Resilience Social Support Talents/Skills  ADL's:  Intact  Cognition: WNL  Sleep:  Fair   Screenings:   Assessment and Plan: This patient is a 68 year old female with a history of depression and anxiety.  She has been more depressed because of recent stressors in the family.  We will therefore increase Prozac from 20 to 40 mg daily.  She will continue Xanax 1 mg 4 times daily for anxiety and Restoril 30 mg as needed for sleep as she feels these of both have been helpful.  She will return to see me in 2 months or call sooner if needed   [name], MD 01/19/2019, 10:42 AM

## 2019-01-28 ENCOUNTER — Encounter: Payer: Self-pay | Admitting: *Deleted

## 2019-01-31 ENCOUNTER — Encounter: Payer: Self-pay | Admitting: Cardiology

## 2019-01-31 ENCOUNTER — Telehealth: Payer: Self-pay | Admitting: Cardiology

## 2019-01-31 ENCOUNTER — Ambulatory Visit (INDEPENDENT_AMBULATORY_CARE_PROVIDER_SITE_OTHER): Payer: Medicare Other | Admitting: Cardiology

## 2019-01-31 ENCOUNTER — Encounter: Payer: Self-pay | Admitting: *Deleted

## 2019-01-31 ENCOUNTER — Other Ambulatory Visit: Payer: Self-pay

## 2019-01-31 VITALS — BP 124/84 | HR 70 | Ht 64.0 in | Wt 221.0 lb

## 2019-01-31 DIAGNOSIS — R079 Chest pain, unspecified: Secondary | ICD-10-CM | POA: Diagnosis not present

## 2019-01-31 DIAGNOSIS — R002 Palpitations: Secondary | ICD-10-CM | POA: Diagnosis not present

## 2019-01-31 DIAGNOSIS — I471 Supraventricular tachycardia: Secondary | ICD-10-CM | POA: Diagnosis not present

## 2019-01-31 DIAGNOSIS — I10 Essential (primary) hypertension: Secondary | ICD-10-CM | POA: Diagnosis not present

## 2019-01-31 MED ORDER — METOPROLOL SUCCINATE ER 50 MG PO TB24: ORAL_TABLET | ORAL | 3 refills | Status: DC

## 2019-01-31 NOTE — Progress Notes (Signed)
Records received from [facility].    Echocardiogram performed on October 5 reported mild LVH with LVEF 60 to 65%, mild left atrial enlargement, mildly calcified mitral leaflets, trace tricuspid regurgitation, no pericardial effusion.  Zio patch with 11-day and 17-hour analysis time showed predominant rhythm being sinus with heart rate ranging from 52 bpm up to 114 bpm and average heart rate 74 bpm.  There were rare PACs and brief episodes of SVT, the longest of which lasted 7 beats.  There were frequent PVCs noted however representing 20% of total beats with some bigeminy and trigeminy, but no sustained ventricular events.

## 2019-01-31 NOTE — Telephone Encounter (Signed)
Patient informed and verbalized understanding of plan   Lexiscan instructions and copy of results from ZIO and echo left up front for patient pick up on Friday.

## 2019-01-31 NOTE — Addendum Note (Signed)
Addended by: Merlene Laughter on: 01/31/2019 12:59 PM   Modules accepted: Orders

## 2019-01-31 NOTE — Telephone Encounter (Signed)
°  Precert needed for: Lexiscan  Location: [facility]    Date: Feb 09, 2019

## 2019-01-31 NOTE — Patient Instructions (Addendum)
Medication Instructions:   Your physician recommends that you continue on your current medications as directed. Please refer to the Current Medication list given to you today.  Labwork:  NONE  Testing/Procedures:  NONE  Follow-Up:  Your physician recommends that you schedule a follow-up appointment in: pending review of test.   Any Other Special Instructions Will Be Listed Below (If Applicable).  If you need a refill on your cardiac medications before your next appointment, please call your pharmacy.

## 2019-01-31 NOTE — Telephone Encounter (Signed)
I was able to review records from [facility] received after the patient had left the office for consultation earlier today.  Echocardiogram performed on October 5 reported mild LVH with LVEF 60 to 65%, mild left atrial enlargement, mildly calcified mitral leaflets, trace tricuspid regurgitation, no pericardial effusion.  Zio patch with 11-day and 17-hour analysis time showed predominant rhythm being sinus with heart rate ranging from 52 bpm up to 114 bpm and average heart rate 74 bpm.  There were rare PACs and brief episodes of SVT, the longest of which lasted 7 beats.  There were frequent PVCs noted however representing 20% of total beats with some bigeminy and trigeminy, but no sustained ventricular events.  Although reassuring that LVEF is normal, she did have frequent PVCs.  Beta-blocker was increased by Dr. Woody Seller.  Please have her come by the office to get an ECG with rhythm strip, preferably while having PVCs to get a sense for origin (such as outflow tract, etc).  Also schedule Lexiscan Myoview to exclude underlying ischemic heart disease.  Try and increase Toprol-XL to 75 mg twice daily.  Schedule office follow-up in the next 4 to 6 weeks.

## 2019-01-31 NOTE — Progress Notes (Signed)
Cardiology Office Note  Date: 01/31/2019   ID: [name], DOB Aug 05, 1950, MRN [id number]  PCP:  Glenda Chroman, MD  Cardiologist:  Rozann Lesches, MD Electrophysiologist:  None   Chief Complaint  Patient presents with  . Palpitations    History of Present Illness: [name] is a 68 y.o. female referred for cardiology consultation by Dr. Woody Seller for the evaluation of PSVT.  She describes a history of intermittent palpitations, fairly sudden onset and without specific trigger.  This has been going on intermittently since February.  She states that she had left shoulder surgery around that time.  Prior to this she has felt occasional "skips" but no prolonged palpitations.  She has been on Toprol-XL for treatment of hypertension.  She tells me that she wore a heart monitor (Zio patch) and also underwent an echocardiogram through [facility] back in October.  Unfortunately, these records were not sent to me in time for this consultation, they have been requested again.  Office note from Dr. Woody Seller indicates SVT.  Patient states that Toprol-XL was increased from 50 mg in the morning and 25 mg in the evening up to 50 mg twice daily most recently.  She has had no syncope with the palpitations, no chest pain.  Otherwise reports no major health change or new medications.   Past Medical History:  Diagnosis Date  . Anxiety   . Arthritis   . COPD (chronic obstructive pulmonary disease) ([place])   . Depression   . Diverticulitis 04/2012   [facility]  . Essential hypertension   . GERD (gastroesophageal reflux disease)   . Hepatitis C    Treated  . History of pneumonia   . Insomnia   . Migraine headache   . Splenomegaly   . Thyroid disease   . Wears glasses     Past Surgical History:  Procedure Laterality Date  . ABDOMINAL HYSTERECTOMY    . ANKLE SURGERY Right   . COLON SURGERY     "part of color removed"  . COLONOSCOPY    . ESOPHAGEAL MANOMETRY N/A  04/10/2014   Procedure: ESOPHAGEAL MANOMETRY (EM);  Surgeon: Gatha Mayer, MD;  [facility]: [facility];  Service: Endoscopy;  Laterality: N/A;  . ESOPHAGOGASTRODUODENOSCOPY    . INCISIONAL HERNIA REPAIR N/A 10/24/2015   Procedure: LAPAROSCOPIC REPAIR INCISIONAL HERNIA WITH MESH;  Surgeon: Georganna Skeans, MD;  Location: [place];  Service: General;  Laterality: N/A;  . INSERTION OF MESH N/A 10/24/2015   Procedure: INSERTION OF MESH;  Surgeon: Georganna Skeans, MD;  Location: [place];  Service: General;  Laterality: N/A;  . JOINT REPLACEMENT    . KNEE SURGERY Right    x4  . [place] IMPEDANCE STUDY N/A 04/10/2014   Procedure: [place] IMPEDANCE STUDY;  Surgeon: Gatha Mayer, MD;  [facility];  Service: Endoscopy;  Laterality: N/A;  . TOTAL SHOULDER ARTHROPLASTY Left 04/22/2018   Procedure: Left total shoulder arthroplasty;  Surgeon: Justice Britain, MD;  [facility];  Service: Orthopedics;  Laterality: Left;  153mn    Current Outpatient Medications  Medication Sig Dispense Refill  . albuterol (PROVENTIL HFA;VENTOLIN HFA) 108 (90 Base) MCG/ACT inhaler Inhale 2 puffs into the lungs every 6 (six) hours as needed for wheezing or shortness of breath.    .Marland Kitchenalbuterol (PROVENTIL) (2.5 MG/3ML) 0.083% nebulizer solution Take 2.5 mg by nebulization every 6 (six) hours as needed for wheezing or shortness of breath.    . ALPRAZolam (Duanne Moron 1  MG tablet Take 1 tablet (1 mg total) by mouth 4 (four) times daily. 120 tablet 3  . aspirin EC 81 MG tablet Take 81 mg by mouth daily.    [name] Calcium (STOOL SOFTENER PO) Take 2 capsules by mouth daily.     . ergocalciferol (VITAMIN D2) 1.25 MG (50000 UT) capsule Take 50,000 Units by mouth 2 (two) times a week.    . Fluticasone-Umeclidin-Vilant (TRELEGY ELLIPTA IN) Inhale into the lungs.    . folic acid (FOLVITE) 1 MG tablet Take 2 mg by mouth daily.    Marland Kitchen gabapentin (NEURONTIN) 100 MG capsule Take 300 mg by mouth at bedtime.     . meloxicam (MOBIC) 15 MG tablet Take 1  tablet (15 mg total) by mouth daily. 30 tablet 1  . methotrexate (RHEUMATREX) 2.5 MG tablet TAKE SIX TABLETS BY MOUTH EVERY WEEK. MAX DAILY DOSE SIX TABLETS    . metoprolol succinate (TOPROL-XL) 50 MG 24 hr tablet Take 25-50 mg by mouth 2 (two) times daily.     . Multiple Vitamin (MULTI-VITAMIN PO) Take 1 tablet by mouth daily.    Marland Kitchen nystatin (MYCOSTATIN) powder Apply 1 g topically as needed (under breast).   2  . polyethylene glycol powder (MIRALAX) powder Take 17 g by mouth daily. (Patient taking differently: Take 17 g by mouth daily as needed for moderate constipation. ) 255 g 0  . ranitidine (ZANTAC) 300 MG tablet Take 300 mg by mouth at bedtime.  3  . Simethicone (GAS RELIEF PO) Take 2 capsules by mouth as needed (flatulence).     . temazepam (RESTORIL) 30 MG capsule Take 1 capsule (30 mg total) by mouth at bedtime as needed. for sleep 30 capsule 3  . traMADol (ULTRAM) 50 MG tablet Take 1 tablet (50 mg total) by mouth every 6 (six) hours as needed. 40 tablet 2   No current facility-administered medications for this visit.    Allergies:  Levothyroxine and Tylenol [acetaminophen]   Social History: The patient  reports that she has never smoked. She has never used smokeless tobacco. She reports previous alcohol use of about 3.0 standard drinks of alcohol per week. She reports that she does not use drugs.   Family History: The patient's family history includes Alcohol abuse in her father and paternal uncle; Anxiety disorder in her daughter; Bipolar disorder in her daughter; Dementia in her father and maternal grandmother; Depression in her brother and paternal uncle; Drug abuse in her brother.   ROS:  Please see the history of present illness. Otherwise, complete review of systems is positive for arthritic pains, improving left shoulder range of motion.  All other systems are reviewed and negative.   Physical Exam: VS:  BP 124/84   Pulse 70   Ht 5' 4"  (1.626 m)   Wt 221 lb (100.2 kg)   SpO2  97%   BMI 37.93 kg/m , BMI Body mass index is 37.93 kg/m.  Wt Readings from Last 3 Encounters:  01/31/19 221 lb (100.2 kg)  04/22/18 224 lb 6 oz (101.8 kg)  04/15/18 224 lb 6 oz (101.8 kg)    General: Obese woman, appears comfortable at rest. HEENT: Conjunctiva and lids normal, wearing a mask. Neck: Supple, no elevated JVP or carotid bruits, no thyromegaly. Lungs: Clear to auscultation, nonlabored breathing at rest. Cardiac: Regular rate and rhythm with rare ectopic beat, no S3 or significant systolic murmur, no pericardial rub. Abdomen: Soft, nontender, bowel sounds present. Extremities: No pitting edema, distal pulses 2+.  Skin: Warm and dry. Musculoskeletal: No kyphosis. Neuropsychiatric: Alert and oriented x3, affect grossly appropriate.  ECG:  An ECG dated 04/15/2018 was personally reviewed today and demonstrated:  Sinus rhythm with prolonged PR interval, PVC, R' in lead V1 V2, nonspecific T wave changes.  Recent Labwork: 04/15/2018: BUN 16; Creatinine, Ser 0.73; Hemoglobin 12.5; Platelets 138; Potassium 4.4; Sodium 137   Other Studies Reviewed Today:  [facility] 10/31/2004: MYOCARDIAL IMAGING WITH SPECT (REST AND EXERCISE):  Technique: Standard myocardial SPECT imaging was performed after resting intravenous injection of [id number]. Subsequently, exercise tolerance test was performed by the patient under the supervision of the Cardiology staff. At peak-stress, [id number] was injected intravenously and standard myocardial SPECT imaging was performed. Quantitative gated imaging was also performed to evaluate left ventricular wall motion, and estimate left ventricular ejection fraction.   Radiopharmaceutical: [id number] 10.0 mCi at rest and 30.0 mCi during stress.  Comparison: None.   No abnormal areas of activity are noted to suggest inducible left ventricular myocardial ischemia. A small fixed defect at the apex may be artifactual.   GATED LEFT VENTRICULAR WALL MOTION  STUDY:  Minimal inferior wall hypokinesis noted.   LEFT VENTRICULAR EJECTION FRACTION:  Left ventricular is calculated at 58%34%th end diastolic volume of 68 mL and end systolic volume of 29 mL.   IMPRESSION:  1. No evidence of inducible left ventricular myocardial ischemia.   2. Left ventricular ejection fraction calculated at 58%.  Assessment and Plan:  1.  Recurring palpitations as described above and reportedly findings of SVT by Zio patch per Dr. VyaWoody SellerRecords have been requested, also including echocardiogram done for cardiac structural assessment.  I will plan to review this information when it is available and then can let the patient know next step.  If this is PSVT, beta-blocker has already been increased, we might consider adding a calcium channel blocker and follow-up from there.  2.  Essential hypertension.  She has been on Toprol-XL.  Systolic is in the 120196Qday.  Medication Adjustments/Labs and Tests Ordered: Current medicines are reviewed at length with the patient today.  Concerns regarding medicines are outlined above.   Tests Ordered: Orders Placed This Encounter  Procedures  . EKG 12-Lead    Medication Changes: No orders of the defined types were placed in this encounter.   Disposition:  Follow up Zio patch and echocardiogram results, then make further recommendations.  Signed, SamSatira SarkD, FACMelbourne Surgery Center LLC/16/2020 10:48 AM    [place] [id number]: [phone]: [phone]

## 2019-02-04 ENCOUNTER — Ambulatory Visit (INDEPENDENT_AMBULATORY_CARE_PROVIDER_SITE_OTHER): Payer: Medicare Other | Admitting: *Deleted

## 2019-02-04 ENCOUNTER — Other Ambulatory Visit: Payer: Self-pay

## 2019-02-04 DIAGNOSIS — I471 Supraventricular tachycardia: Secondary | ICD-10-CM

## 2019-02-04 NOTE — Progress Notes (Signed)
EKG and rhythm strip done per recent notes - pt denies any complaints at this time - is scheduled for stress test on 11/25 - given instructions today - will forward to provider

## 2019-02-04 NOTE — Progress Notes (Signed)
I reviewed the rhythm strip which shows frequent PVCs that are positive in the inferior leads suggesting an outflow tract origin which generally would suggest a more benign issue.  Continue with current plan for Myoview and increase in beta-blocker.

## 2019-02-04 NOTE — Progress Notes (Signed)
Pt voiced understanding

## 2019-02-09 ENCOUNTER — Encounter (HOSPITAL_COMMUNITY)
Admission: RE | Admit: 2019-02-09 | Discharge: 2019-02-09 | Disposition: A | Discharge status: Home / Self Care | Payer: Medicare Other | Source: Ambulatory Visit | Attending: Cardiology | Admitting: Cardiology

## 2019-02-09 ENCOUNTER — Ambulatory Visit (HOSPITAL_COMMUNITY)
Admission: RE | Admit: 2019-02-09 | Discharge: 2019-02-09 | Disposition: A | Discharge status: Home / Self Care | Payer: Medicare Other | Source: Ambulatory Visit | Attending: Cardiology | Admitting: Cardiology

## 2019-02-09 ENCOUNTER — Other Ambulatory Visit: Payer: Self-pay

## 2019-02-09 DIAGNOSIS — I471 Supraventricular tachycardia: Secondary | ICD-10-CM | POA: Insufficient documentation

## 2019-02-09 DIAGNOSIS — R079 Chest pain, unspecified: Secondary | ICD-10-CM | POA: Diagnosis present

## 2019-02-09 LAB — NM MYOCAR MULTI W/SPECT W/WALL MOTION / EF
Peak HR: 78 {beats}/min
Rest HR: 60 {beats}/min

## 2019-02-09 MED ORDER — TECHNETIUM TC 99M TETROFOSMIN IV KIT
30.0000 | PACK | Freq: Once | INTRAVENOUS | Status: AC | PRN
  Administered 2019-02-09: 30 via INTRAVENOUS

## 2019-02-09 MED ORDER — SODIUM CHLORIDE FLUSH 0.9 % IV SOLN
INTRAVENOUS | Status: AC
  Administered 2019-02-09: 10 mL via INTRAVENOUS
  Filled 2019-02-09: qty 10

## 2019-02-09 MED ORDER — TECHNETIUM TC 99M TETROFOSMIN IV KIT
10.0000 | PACK | Freq: Once | INTRAVENOUS | Status: AC | PRN
  Administered 2019-02-09: 11 via INTRAVENOUS

## 2019-02-09 MED ORDER — REGADENOSON 0.4 MG/5ML IV SOLN
INTRAVENOUS | Status: AC
  Administered 2019-02-09: 0.4 mg via INTRAVENOUS
  Filled 2019-02-09: qty 5

## 2019-02-15 ENCOUNTER — Telehealth: Payer: Self-pay | Admitting: *Deleted

## 2019-02-15 NOTE — Telephone Encounter (Signed)
Patient informed. Copy sent to PCP °

## 2019-02-15 NOTE — Telephone Encounter (Signed)
-----   Message from Satira Sark, MD sent at 02/12/2019  4:09 PM EST ----- Results reviewed.  Myoview does not show any obvious ischemia which was main question for study.  Reduced LVEF is likely artifactual, she already had an echocardiogram in October that showed LVEF 60%.  Continue with current medications and follow-up plan.

## 2019-02-17 ENCOUNTER — Other Ambulatory Visit (HOSPITAL_COMMUNITY): Payer: Self-pay | Admitting: Internal Medicine

## 2019-02-17 DIAGNOSIS — Z1231 Encounter for screening mammogram for malignant neoplasm of breast: Secondary | ICD-10-CM

## 2019-02-28 ENCOUNTER — Other Ambulatory Visit: Payer: Self-pay

## 2019-02-28 ENCOUNTER — Ambulatory Visit (HOSPITAL_COMMUNITY)
Admission: RE | Admit: 2019-02-28 | Discharge: 2019-02-28 | Disposition: A | Discharge status: Home / Self Care | Payer: Medicare Other | Source: Ambulatory Visit | Attending: Internal Medicine | Admitting: Internal Medicine

## 2019-02-28 DIAGNOSIS — Z1231 Encounter for screening mammogram for malignant neoplasm of breast: Secondary | ICD-10-CM | POA: Insufficient documentation

## 2019-03-21 ENCOUNTER — Ambulatory Visit (INDEPENDENT_AMBULATORY_CARE_PROVIDER_SITE_OTHER): Payer: Medicare Other | Admitting: Psychiatry

## 2019-03-21 ENCOUNTER — Other Ambulatory Visit: Payer: Self-pay

## 2019-03-21 ENCOUNTER — Encounter (HOSPITAL_COMMUNITY): Payer: Self-pay | Admitting: Psychiatry

## 2019-03-21 DIAGNOSIS — F331 Major depressive disorder, recurrent, moderate: Secondary | ICD-10-CM | POA: Diagnosis not present

## 2019-03-21 MED ORDER — FLUOXETINE HCL 40 MG PO CAPS: 40.0000 mg | ORAL_CAPSULE | Freq: Every day | ORAL | 2 refills | Status: DC

## 2019-03-21 MED ORDER — ALPRAZOLAM 1 MG PO TABS: 1.0000 mg | ORAL_TABLET | Freq: Four times a day (QID) | ORAL | 2 refills | Status: DC

## 2019-03-21 MED ORDER — TEMAZEPAM 30 MG PO CAPS: 30.0000 mg | ORAL_CAPSULE | Freq: Every evening | ORAL | 3 refills | Status: DC | PRN

## 2019-03-21 NOTE — Progress Notes (Signed)
Virtual Visit via Telephone Note  I connected with Briscoe Deutscher on 03/21/19 at 11:00 AM EST by telephone and verified that I am speaking with the correct person using two identifiers.   I discussed the limitations, risks, security and privacy concerns of performing an evaluation and management service by telephone and the availability of in person appointments. I also discussed with the patient that there may be a patient responsible charge related to this service. The patient expressed understanding and agreed to proceed.     I discussed the assessment and treatment plan with the patient. The patient was provided an opportunity to ask questions and all were answered. The patient agreed with the plan and demonstrated an understanding of the instructions.   The patient was advised to call back or seek an in-person evaluation if the symptoms worsen or if the condition fails to improve as anticipated.  I provided 15 minutes of non-face-to-face time during this encounter.   [name], MD  [facility] MD/PA/NP OP Progress Note  03/21/2019 11:12 AM Briscoe Deutscher  MRN:  [id number]  Chief Complaint:  Chief Complaint    Depression; Anxiety; Follow-up     HPI: This patient is a 69 year old widowed white female who lives alone in [place].  Her daughter and her brothers live nearby.  She has 1 son in [place].  She is on disability.  The patient returns after 2 months for follow-up regarding depression and anxiety.  She did have a cardiac Lexiscan but did not show anything significant.  She states numerous people in her family particularly in [place] have had coronavirus and she is worried about them but there is not much she can do.  She is totally staying in her house unless she takes her dog outside for a short walk.  Her family members do her grocery shopping.  She is trying to stay busy by doing crossword puzzles and other games.  I did increase her Prozac and it seems to be helping with her mood.  She  is generally sleeping well although recently was put on prednisone and this is affected her sleep.  Her anxiety is under good control.  She denies suicidal ideation Visit Diagnosis:    ICD-10-CM   1. Major depressive disorder, recurrent episode, moderate (HCC)  F33.1     Past Psychiatric History: none  Past Medical History:  Past Medical History:  Diagnosis Date  . Anxiety   . Arthritis   . COPD (chronic obstructive pulmonary disease) ([place])   . Depression   . Diverticulitis 04/2012   [facility]  . Essential hypertension   . GERD (gastroesophageal reflux disease)   . Hepatitis C    Treated  . History of pneumonia   . Insomnia   . Migraine headache   . Splenomegaly   . Thyroid disease   . Wears glasses     Past Surgical History:  Procedure Laterality Date  . ABDOMINAL HYSTERECTOMY    . ANKLE SURGERY Right   . COLON SURGERY     "part of color removed"  . COLONOSCOPY    . ESOPHAGEAL MANOMETRY N/A 04/10/2014   Procedure: ESOPHAGEAL MANOMETRY (EM);  Surgeon: Gatha Mayer, MD;  [facility];  Service: Endoscopy;  Laterality: N/A;  . ESOPHAGOGASTRODUODENOSCOPY    . INCISIONAL HERNIA REPAIR N/A 10/24/2015   Procedure: LAPAROSCOPIC REPAIR INCISIONAL HERNIA WITH MESH;  Surgeon: Georganna Skeans, MD;  Location: [place];  Service: General;  Laterality: N/A;  . INSERTION OF MESH  N/A 10/24/2015   Procedure: INSERTION OF MESH;  Surgeon: Georganna Skeans, MD;  Location: [place];  Service: General;  Laterality: N/A;  . JOINT REPLACEMENT    . KNEE SURGERY Right    x4  . [place] IMPEDANCE STUDY N/A 04/10/2014   Procedure: [place] IMPEDANCE STUDY;  Surgeon: Gatha Mayer, MD;  [facility];  Service: Endoscopy;  Laterality: N/A;  . TOTAL SHOULDER ARTHROPLASTY Left 04/22/2018   Procedure: Left total shoulder arthroplasty;  Surgeon: Justice Britain, MD;  [facility];  Service: Orthopedics;  Laterality: Left;  122mn    Family Psychiatric History: see below  Family History:   Family History  Problem Relation Age of Onset  . Alcohol abuse Father   . Dementia Father   . Dementia Maternal Grandmother   . Bipolar disorder Daughter   . Anxiety disorder Daughter   . Depression Brother   . Drug abuse Brother   . Alcohol abuse Paternal Uncle   . Depression Paternal Uncle   . ADD / ADHD Neg Hx     Social History:  Social History   Socioeconomic History  . Marital status: Widowed    Spouse name: Not on file  . Number of children: 2  . Years of education: 12th grade  . Highest education level: Not on file  Occupational History  . Occupation: RETIRED  Tobacco Use  . Smoking status: Never Smoker  . Smokeless tobacco: Never Used  Substance and Sexual Activity  . Alcohol use: Not Currently    Alcohol/week: 3.0 standard drinks    Types: 3 Cans of beer per week  . Drug use: No  . Sexual activity: Never  Other Topics Concern  . Not on file  Social History Narrative  . Not on file   Social Determinants of Health   Financial Resource Strain:   . Difficulty of Paying Living Expenses: Not on file  Food Insecurity:   . Worried About RCharity fundraiserin the Last Year: Not on file  . Ran Out of Food in the Last Year: Not on file  Transportation Needs:   . Lack of Transportation (Medical): Not on file  . Lack of Transportation (Non-Medical): Not on file  Physical Activity:   . Days of Exercise per Week: Not on file  . Minutes of Exercise per Session: Not on file  Stress:   . Feeling of Stress : Not on file  Social Connections:   . Frequency of Communication with Friends and Family: Not on file  . Frequency of Social Gatherings with Friends and Family: Not on file  . Attends Religious Services: Not on file  . Active Member of Clubs or Organizations: Not on file  . Attends CArchivistMeetings: Not on file  . Marital Status: Not on file    Allergies:  Allergies  Allergen Reactions  . Levothyroxine Hives and Itching  . Tylenol  [Acetaminophen] Other (See Comments)    Liver function per MD     Metabolic Disorder Labs: No results found for: HGBA1C, MPG No results found for: PROLACTIN No results found for: CHOL, TRIG, HDL, CHOLHDL, VLDL, LDLCALC Lab Results  Component Value Date   TSH 4.540 (H) 10/28/2007   TSH 1.782 11/03/2006    Therapeutic Level Labs: No results found for: LITHIUM No results found for: VALPROATE No components found for:  CBMZ  Current Medications: Current Outpatient Medications  Medication Sig Dispense Refill  . albuterol (PROVENTIL HFA;VENTOLIN HFA) 108 (90 Base) MCG/ACT inhaler  Inhale 2 puffs into the lungs every 6 (six) hours as needed for wheezing or shortness of breath.    Marland Kitchen albuterol (PROVENTIL) (2.5 MG/3ML) 0.083% nebulizer solution Take 2.5 mg by nebulization every 6 (six) hours as needed for wheezing or shortness of breath.    . ALPRAZolam (XANAX) 1 MG tablet Take 1 tablet (1 mg total) by mouth 4 (four) times daily. 120 tablet 2  . aspirin EC 81 MG tablet Take 81 mg by mouth daily.    [name] Calcium (STOOL SOFTENER PO) Take 2 capsules by mouth daily.     . ergocalciferol (VITAMIN D2) 1.25 MG (50000 UT) capsule Take 50,000 Units by mouth 2 (two) times a week.    Marland Kitchen FLUoxetine (PROZAC) 40 MG capsule Take 1 capsule (40 mg total) by mouth daily. 30 capsule 2  . Fluticasone-Umeclidin-Vilant (TRELEGY ELLIPTA IN) Inhale into the lungs.    . folic acid (FOLVITE) 1 MG tablet Take 2 mg by mouth daily.    Marland Kitchen gabapentin (NEURONTIN) 100 MG capsule Take 300 mg by mouth at bedtime.     . meloxicam (MOBIC) 15 MG tablet Take 1 tablet (15 mg total) by mouth daily. 30 tablet 1  . methotrexate (RHEUMATREX) 2.5 MG tablet TAKE SIX TABLETS BY MOUTH EVERY WEEK. MAX DAILY DOSE SIX TABLETS    . metoprolol succinate (TOPROL-XL) 50 MG 24 hr tablet Take 75 mg by mouth twice daily 135 tablet 3  . Multiple Vitamin (MULTI-VITAMIN PO) Take 1 tablet by mouth daily.    Marland Kitchen nystatin (MYCOSTATIN) powder Apply 1 g  topically as needed (under breast).   2  . polyethylene glycol powder (MIRALAX) powder Take 17 g by mouth daily. (Patient taking differently: Take 17 g by mouth daily as needed for moderate constipation. ) 255 g 0  . ranitidine (ZANTAC) 300 MG tablet Take 300 mg by mouth at bedtime.  3  . Simethicone (GAS RELIEF PO) Take 2 capsules by mouth as needed (flatulence).     . temazepam (RESTORIL) 30 MG capsule Take 1 capsule (30 mg total) by mouth at bedtime as needed. for sleep 30 capsule 3  . traMADol (ULTRAM) 50 MG tablet Take 1 tablet (50 mg total) by mouth every 6 (six) hours as needed. 40 tablet 2   No current facility-administered medications for this visit.     Musculoskeletal: Strength & Muscle Tone: within normal limits Gait & Station: normal Patient leans: N/A  Psychiatric Specialty Exam: Review of Systems  Musculoskeletal: Positive for arthralgias, back pain and joint swelling.  All other systems reviewed and are negative.   There were no vitals taken for this visit.There is no height or weight on file to calculate BMI.  General Appearance: NA  Eye Contact:  NA  Speech:  Clear and Coherent  Volume:  Normal  Mood:  Dysphoric  Affect:  NA  Thought Process:  Goal Directed  Orientation:  Full (Time, Place, and Person)  Thought Content: Rumination   Suicidal Thoughts:  No  Homicidal Thoughts:  No  Memory:  Immediate;   Good Recent;   Good Remote;   Good  Judgement:  Good  Insight:  Good  Psychomotor Activity:  Decreased  Concentration:  Concentration: Good and Attention Span: Good  Recall:  Good  Fund of Knowledge: Good  Language: Good  Akathisia:  No  Handed:  Right  AIMS (if indicated): not done  Assets:  Communication Skills Desire for Improvement Resilience Social Support Talents/Skills  ADL's:  Intact  Cognition:  WNL  Sleep:  Fair   Screenings:   Assessment and Plan: This patient is a 69 year old female with a history of depression and anxiety.  She  seems to be doing somewhat better since we increase Prozac.  She will continue Prozac 40 mg daily for depression, Xanax 1 mg 4 times daily for anxiety and Restoril 30 mg as needed for sleep.  She will return to see me in 3 months   [name], MD 03/21/2019, 11:12 AM

## 2019-03-29 ENCOUNTER — Telehealth: Payer: Self-pay | Admitting: Cardiology

## 2019-03-29 NOTE — Telephone Encounter (Signed)

## 2019-03-31 ENCOUNTER — Encounter: Payer: Self-pay | Admitting: Cardiology

## 2019-03-31 ENCOUNTER — Telehealth (INDEPENDENT_AMBULATORY_CARE_PROVIDER_SITE_OTHER): Payer: Medicare Other | Admitting: Cardiology

## 2019-03-31 VITALS — BP 155/87 | HR 75 | Ht 64.0 in | Wt 218.2 lb

## 2019-03-31 DIAGNOSIS — I493 Ventricular premature depolarization: Secondary | ICD-10-CM | POA: Diagnosis not present

## 2019-03-31 DIAGNOSIS — I10 Essential (primary) hypertension: Secondary | ICD-10-CM

## 2019-03-31 NOTE — Patient Instructions (Addendum)

## 2019-03-31 NOTE — Progress Notes (Signed)
Virtual Visit via Telephone Note   This visit type was conducted due to national recommendations for restrictions regarding the COVID-19 Pandemic (e.g. social distancing) in an effort to limit this patient's exposure and mitigate transmission in our community.  Due to her co-morbid illnesses, this patient is at least at moderate risk for complications without adequate follow up.  This format is felt to be most appropriate for this patient at this time.  The patient did not have access to video technology/had technical difficulties with video requiring transitioning to audio format only (telephone).  All issues noted in this document were discussed and addressed.  No physical exam could be performed with this format.  Please refer to the patient's chart for her  consent to telehealth for [facility].   Date:  03/31/2019   ID:  [name], DOB 24-Apr-1950, MRN [id number]  Patient Location: Home Provider Location: Office  PCP:  Glenda Chroman, MD  Cardiologist:  Rozann Lesches, MD Electrophysiologist:  None   Evaluation Performed:  Follow-Up Visit  Chief Complaint:  Cardiac follow-up  History of Present Illness:    [name] is a 69 y.o. female last seen in November 2020.  We spoke by phone today.  From a cardiac perspective she states that her sense of palpitations has improved significantly following increase in beta-blocker.  She has had no syncope.  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  Currently continues on Toprol-XL 75 mg twice daily and has undergone structural and ischemic testing as reviewed below.  Echocardiogram performed in October 2020 reported mild LVH with LVEF 60 to 65%, mild left atrial enlargement, mildly calcified mitral leaflets, trace tricuspid regurgitation, no pericardial effusion. Zio patch with 11-day and 17-hour analysis time showed predominant rhythm being sinus with heart rate ranging from 52 bpm up to 114 bpm and  average heart rate 74 bpm. There were rare PACs and brief episodes of SVT, the longest of which lasted 7 beats.  There were frequent PVCs noted however representing 20% of total beats with some bigeminy and trigeminy, but no sustained ventricular events.  Lexiscan Myoview performed in November 2020 showed no definite ischemia.  LVEF was under-calculated.   Past Medical History:  Diagnosis Date  . Anxiety   . Arthritis   . COPD (chronic obstructive pulmonary disease) ([place])   . Depression   . Diverticulitis 04/2012   [facility]  . Essential hypertension   . GERD (gastroesophageal reflux disease)   . Hepatitis C    Treated  . History of pneumonia   . Insomnia   . Migraine headache   . Splenomegaly   . Thyroid disease   . Wears glasses    Past Surgical History:  Procedure Laterality Date  . ABDOMINAL HYSTERECTOMY    . ANKLE SURGERY Right   . COLON SURGERY     "part of color removed"  . COLONOSCOPY    . ESOPHAGEAL MANOMETRY N/A 04/10/2014   Procedure: ESOPHAGEAL MANOMETRY (EM);  Surgeon: Gatha Mayer, MD;  [facility];  Service: Endoscopy;  Laterality: N/A;  . ESOPHAGOGASTRODUODENOSCOPY    . INCISIONAL HERNIA REPAIR N/A 10/24/2015   Procedure: LAPAROSCOPIC REPAIR INCISIONAL HERNIA WITH MESH;  Surgeon: Georganna Skeans, MD;  Location: [place];  Service: General;  Laterality: N/A;  . INSERTION OF MESH N/A 10/24/2015   Procedure: INSERTION OF MESH;  Surgeon: Georganna Skeans, MD;  Location: [place];  Service: General;  Laterality: N/A;  .  JOINT REPLACEMENT    . KNEE SURGERY Right    x4  . [place] IMPEDANCE STUDY N/A 04/10/2014   Procedure: [place] IMPEDANCE STUDY;  Surgeon: Gatha Mayer, MD;  [facility];  Service: Endoscopy;  Laterality: N/A;  . TOTAL SHOULDER ARTHROPLASTY Left 04/22/2018   Procedure: Left total shoulder arthroplasty;  Surgeon: Justice Britain, MD;  [facility]: [facility];  Service: Orthopedics;  Laterality: Left;  127mn     Current Meds  Medication Sig    . albuterol (PROVENTIL HFA;VENTOLIN HFA) 108 (90 Base) MCG/ACT inhaler Inhale 2 puffs into the lungs every 6 (six) hours as needed for wheezing or shortness of breath.  .Marland Kitchenalbuterol (PROVENTIL) (2.5 MG/3ML) 0.083% nebulizer solution Take 2.5 mg by nebulization every 6 (six) hours as needed for wheezing or shortness of breath.  . ALPRAZolam (XANAX) 1 MG tablet Take 1 tablet (1 mg total) by mouth 4 (four) times daily.  .Marland Kitchenaspirin EC 81 MG tablet Take 81 mg by mouth daily.  .Mariane BaumgartenCalcium (STOOL SOFTENER PO) Take 2 capsules by mouth daily.   . ergocalciferol (VITAMIN D2) 1.25 MG (50000 UT) capsule Take 50,000 Units by mouth 2 (two) times a week.  .Marland KitchenFLUoxetine (PROZAC) 40 MG capsule Take 1 capsule (40 mg total) by mouth daily.  . Fluticasone-Umeclidin-Vilant (TRELEGY ELLIPTA IN) Inhale into the lungs.  . folic acid (FOLVITE) 1 MG tablet Take 2 mg by mouth daily.  .Marland Kitchengabapentin (NEURONTIN) 100 MG capsule Take 300 mg by mouth at bedtime.   . meloxicam (MOBIC) 15 MG tablet Take 1 tablet (15 mg total) by mouth daily.  . methotrexate (RHEUMATREX) 2.5 MG tablet Take 20 mg by mouth once a week.   . metoprolol succinate (TOPROL-XL) 50 MG 24 hr tablet Take 75 mg by mouth twice daily  . Multiple Vitamin (MULTI-VITAMIN PO) Take 1 tablet by mouth daily.  .Marland Kitchennystatin (MYCOSTATIN) powder Apply 1 g topically as needed (under breast).   . polyethylene glycol powder (MIRALAX) powder Take 17 g by mouth daily. (Patient taking differently: Take 17 g by mouth daily as needed for moderate constipation. )  . Simethicone (GAS RELIEF PO) Take 2 capsules by mouth as needed (flatulence).   . temazepam (RESTORIL) 30 MG capsule Take 1 capsule (30 mg total) by mouth at bedtime as needed. for sleep  . traMADol (ULTRAM) 50 MG tablet Take 1 tablet (50 mg total) by mouth every 6 (six) hours as needed.     Allergies:   Levothyroxine and Tylenol [acetaminophen]   Social History   Tobacco Use  . Smoking status: Never Smoker  .  Smokeless tobacco: Never Used  Substance Use Topics  . Alcohol use: Not Currently    Alcohol/week: 3.0 standard drinks    Types: 3 Cans of beer per week  . Drug use: No     Family Hx: The patient's family history includes Alcohol abuse in her father and paternal uncle; Anxiety disorder in her daughter; Bipolar disorder in her daughter; Dementia in her father and maternal grandmother; Depression in her brother and paternal uncle; Drug abuse in her brother. There is no history of ADD / ADHD.  ROS:   Please see the history of present illness.    Reports sinus congestion and headache.  No fevers or chills. All other systems reviewed and are negative.   Prior CV studies:   The following studies were reviewed today:  Echocardiogram performed on October 5 reported mild LVH with LVEF 60 to 65%, mild left  atrial enlargement, mildly calcified mitral leaflets, trace tricuspid regurgitation, no pericardial effusion.  Zio patch with 11-day and 17-hour analysis time showed predominant rhythm being sinus with heart rate ranging from 52 bpm up to 114 bpm and average heart rate 74 bpm.  There were rare PACs and brief episodes of SVT, the longest of which lasted 7 beats.  There were frequent PVCs noted however representing 20% of total beats with some bigeminy and trigeminy, but no sustained ventricular events.  Lexiscan Myoview 02/09/2019:  There was no ST segment deviation noted during stress.  Normal myocardial perfusion. Extensive soft tissue attenuation noted.  This is an intermediate risk study, primarily based on reduced LVEF. However, LV function appears grossly normal and the reduced LVEF may be due to gating artifact. If echocardiogram is performed and LV systolic function is found to be normal, this would be considered a low risk study.  Nuclear stress EF: 37%.  Labs/Other Tests and Data Reviewed:    EKG:  An ECG dated 02/04/2019 was personally reviewed today and demonstrated:  Sinus  rhythm with prolonged PR interval and PVC.  Recent Labs: 04/15/2018: BUN 16; Creatinine, Ser 0.73; Hemoglobin 12.5; Platelets 138; Potassium 4.4; Sodium 137  September 2020: TSH 1.78, hemoglobin 12.8, platelets 139, BUN 15, creatinine 0.71, potassium 4.4, AST 24, ALT 22  Wt Readings from Last 3 Encounters:  03/31/19 218 lb 3.2 oz (99 kg)  01/31/19 221 lb (100.2 kg)  04/22/18 224 lb 6 oz (101.8 kg)     Objective:    Vital Signs:  BP (!) 155/87   Pulse 75   Ht 5' 4"  (1.626 m)   Wt 218 lb 3.2 oz (99 kg)   BMI 37.45 kg/m    Patient spoke in full sentences, not obviously short of breath at rest. No audible wheezing or coughing. Speech pattern normal.  ASSESSMENT & PLAN:    1.  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  She reports improvement in palpitations on higher dose of beta-blocker.  No syncope.  Interval ischemic work-up not suggestive of CAD, and LVEF normal by echocardiogram.  Continue with observation.  2.  Essential hypertension.  Systolic is in the 111N today.  Keep follow-up with Dr. Woody Seller.  May need further medication adjustments, possibly addition of ARB.  COVID-19 Education: The signs and symptoms of COVID-19 were discussed with the patient and how to seek care for testing (follow up with PCP or arrange E-visit).  The importance of social distancing was discussed today.  Time:   Today, I have spent 5 minutes with the patient with telehealth technology discussing the above problems.     Medication Adjustments/Labs and Tests Ordered: Current medicines are reviewed at length with the patient today.  Concerns regarding medicines are outlined above.   Tests Ordered: No orders of the defined types were placed in this encounter.   Medication Changes: No orders of the defined types were placed in this encounter.   Follow Up:  In Person 6 months in the [place] office.  Signed, Rozann Lesches, MD  03/31/2019 1:59 PM    [facility]

## 2019-04-04 DIAGNOSIS — J449 Chronic obstructive pulmonary disease, unspecified: Secondary | ICD-10-CM | POA: Diagnosis not present

## 2019-04-04 DIAGNOSIS — I10 Essential (primary) hypertension: Secondary | ICD-10-CM | POA: Diagnosis not present

## 2019-04-04 DIAGNOSIS — M159 Polyosteoarthritis, unspecified: Secondary | ICD-10-CM | POA: Diagnosis not present

## 2019-04-19 DIAGNOSIS — M1990 Unspecified osteoarthritis, unspecified site: Secondary | ICD-10-CM | POA: Diagnosis not present

## 2019-04-19 DIAGNOSIS — E039 Hypothyroidism, unspecified: Secondary | ICD-10-CM | POA: Diagnosis not present

## 2019-04-19 DIAGNOSIS — D696 Thrombocytopenia, unspecified: Secondary | ICD-10-CM | POA: Diagnosis not present

## 2019-04-19 DIAGNOSIS — I10 Essential (primary) hypertension: Secondary | ICD-10-CM | POA: Diagnosis not present

## 2019-04-19 DIAGNOSIS — J449 Chronic obstructive pulmonary disease, unspecified: Secondary | ICD-10-CM | POA: Diagnosis not present

## 2019-04-19 DIAGNOSIS — Z299 Encounter for prophylactic measures, unspecified: Secondary | ICD-10-CM | POA: Diagnosis not present

## 2019-06-01 DIAGNOSIS — J449 Chronic obstructive pulmonary disease, unspecified: Secondary | ICD-10-CM | POA: Diagnosis not present

## 2019-06-01 DIAGNOSIS — M159 Polyosteoarthritis, unspecified: Secondary | ICD-10-CM | POA: Diagnosis not present

## 2019-06-01 DIAGNOSIS — I10 Essential (primary) hypertension: Secondary | ICD-10-CM | POA: Diagnosis not present

## 2019-06-05 DIAGNOSIS — H5213 Myopia, bilateral: Secondary | ICD-10-CM | POA: Diagnosis not present

## 2019-06-10 DIAGNOSIS — Z789 Other specified health status: Secondary | ICD-10-CM | POA: Diagnosis not present

## 2019-06-10 DIAGNOSIS — M069 Rheumatoid arthritis, unspecified: Secondary | ICD-10-CM | POA: Diagnosis not present

## 2019-06-10 DIAGNOSIS — Z299 Encounter for prophylactic measures, unspecified: Secondary | ICD-10-CM | POA: Diagnosis not present

## 2019-06-10 DIAGNOSIS — D849 Immunodeficiency, unspecified: Secondary | ICD-10-CM | POA: Diagnosis not present

## 2019-06-10 DIAGNOSIS — I10 Essential (primary) hypertension: Secondary | ICD-10-CM | POA: Diagnosis not present

## 2019-06-10 DIAGNOSIS — J449 Chronic obstructive pulmonary disease, unspecified: Secondary | ICD-10-CM | POA: Diagnosis not present

## 2019-06-13 ENCOUNTER — Other Ambulatory Visit: Payer: Self-pay | Admitting: Cardiology

## 2019-06-13 ENCOUNTER — Other Ambulatory Visit (HOSPITAL_COMMUNITY): Payer: Self-pay | Admitting: Psychiatry

## 2019-06-13 DIAGNOSIS — I471 Supraventricular tachycardia: Secondary | ICD-10-CM

## 2019-06-13 DIAGNOSIS — R079 Chest pain, unspecified: Secondary | ICD-10-CM

## 2019-06-20 ENCOUNTER — Ambulatory Visit (INDEPENDENT_AMBULATORY_CARE_PROVIDER_SITE_OTHER): Payer: Medicare Other | Admitting: Psychiatry

## 2019-06-20 ENCOUNTER — Other Ambulatory Visit: Payer: Self-pay

## 2019-06-20 ENCOUNTER — Encounter (HOSPITAL_COMMUNITY): Payer: Self-pay | Admitting: Psychiatry

## 2019-06-20 DIAGNOSIS — F331 Major depressive disorder, recurrent, moderate: Secondary | ICD-10-CM

## 2019-06-20 MED ORDER — FLUOXETINE HCL 40 MG PO CAPS: 40.0000 mg | ORAL_CAPSULE | Freq: Every day | ORAL | 2 refills | Status: DC

## 2019-06-20 MED ORDER — TEMAZEPAM 30 MG PO CAPS: 30.0000 mg | ORAL_CAPSULE | Freq: Every evening | ORAL | 3 refills | Status: DC | PRN

## 2019-06-20 MED ORDER — ALPRAZOLAM 1 MG PO TABS: 1.0000 mg | ORAL_TABLET | Freq: Four times a day (QID) | ORAL | 2 refills | Status: DC

## 2019-06-20 NOTE — Progress Notes (Signed)
Virtual Visit via Telephone Note  I connected with [name] on 06/20/19 at  2:00 PM EDT by telephone and verified that I am speaking with the correct person using two identifiers.   I discussed the limitations, risks, security and privacy concerns of performing an evaluation and management service by telephone and the availability of in person appointments. I also discussed with the patient that there may be a patient responsible charge related to this service. The patient expressed understanding and agreed to proceed.   I discussed the assessment and treatment plan with the patient. The patient was provided an opportunity to ask questions and all were answered. The patient agreed with the plan and demonstrated an understanding of the instructions.   The patient was advised to call back or seek an in-person evaluation if the symptoms worsen or if the condition fails to improve as anticipated.  I provided 15 minutes of non-face-to-face time during this encounter.   [name], MD  [facility] MD/PA/NP OP Progress Note  06/20/2019 2:29 PM [name]  MRN:  [id number]  Chief Complaint:  Chief Complaint    Depression; Anxiety; Follow-up     HPI: This patient is a 69 year old widowed white female who lives alone in [place].  Her daughter and her brothers live nearby.  She has 1 son in [place].  She is on disability  The patient returns after 3 months for follow-up regarding depression and anxiety.  She states for the most part she is just been staying around her house and not going out because of the coronavirus.  She still has been having some PVCs although it is better with her increased beta-blocker.  Her primary doctor does not want her to get the coronavirus vaccine until her PVCs are under better control.  Therefore she is being very careful about going out and being around people.  She states that her mood is generally okay and she denies serious depression or suicidal ideation.  Her  anxiety is very well controlled with the Xanax and overall she is sleeping well.  She denies thoughts of suicide or self-harm Visit Diagnosis:    ICD-10-CM   1. Major depressive disorder, recurrent episode, moderate (HCC)  F33.1     Past Psychiatric History: none  Past Medical History:  Past Medical History:  Diagnosis Date  . Anxiety   . Arthritis   . COPD (chronic obstructive pulmonary disease) ([place])   . Depression   . Diverticulitis 04/2012   [facility]  . Essential hypertension   . GERD (gastroesophageal reflux disease)   . Hepatitis C    Treated  . History of pneumonia   . Insomnia   . Migraine headache   . Splenomegaly   . Thyroid disease   . Wears glasses     Past Surgical History:  Procedure Laterality Date  . ABDOMINAL HYSTERECTOMY    . ANKLE SURGERY Right   . COLON SURGERY     "part of color removed"  . COLONOSCOPY    . ESOPHAGEAL MANOMETRY N/A 04/10/2014   Procedure: ESOPHAGEAL MANOMETRY (EM);  Surgeon: Gatha Mayer, MD;  [facility];  Service: Endoscopy;  Laterality: N/A;  . ESOPHAGOGASTRODUODENOSCOPY    . INCISIONAL HERNIA REPAIR N/A 10/24/2015   Procedure: LAPAROSCOPIC REPAIR INCISIONAL HERNIA WITH MESH;  Surgeon: Georganna Skeans, MD;  Location: [place];  Service: General;  Laterality: N/A;  . INSERTION OF MESH N/A 10/24/2015   Procedure: INSERTION OF MESH;  Surgeon: Georganna Skeans, MD;  [facility];  Service: General;  Laterality: N/A;  . JOINT REPLACEMENT    . KNEE SURGERY Right    x4  . [place] IMPEDANCE STUDY N/A 04/10/2014   Procedure: [place] IMPEDANCE STUDY;  Surgeon: Gatha Mayer, MD;  [facility];  Service: Endoscopy;  Laterality: N/A;  . TOTAL SHOULDER ARTHROPLASTY Left 04/22/2018   Procedure: Left total shoulder arthroplasty;  Surgeon: Justice Britain, MD;  [facility];  Service: Orthopedics;  Laterality: Left;  163mn    Family Psychiatric History: see below  Family History:  Family History  Problem Relation Age  of Onset  . Alcohol abuse Father   . Dementia Father   . Dementia Maternal Grandmother   . Bipolar disorder Daughter   . Anxiety disorder Daughter   . Depression Brother   . Drug abuse Brother   . Alcohol abuse Paternal Uncle   . Depression Paternal Uncle   . ADD / ADHD Neg Hx     Social History:  Social History   Socioeconomic History  . Marital status: Widowed    Spouse name: Not on file  . Number of children: 2  . Years of education: 12th grade  . Highest education level: Not on file  Occupational History  . Occupation: RETIRED  Tobacco Use  . Smoking status: Never Smoker  . Smokeless tobacco: Never Used  Substance and Sexual Activity  . Alcohol use: Not Currently    Alcohol/week: 3.0 standard drinks    Types: 3 Cans of beer per week  . Drug use: No  . Sexual activity: Never  Other Topics Concern  . Not on file  Social History Narrative  . Not on file   Social Determinants of Health   Financial Resource Strain:   . Difficulty of Paying Living Expenses:   Food Insecurity:   . Worried About RCharity fundraiserin the Last Year:   . RArboriculturistin the Last Year:   Transportation Needs:   . LFilm/video editor(Medical):   .Marland KitchenLack of Transportation (Non-Medical):   Physical Activity:   . Days of Exercise per Week:   . Minutes of Exercise per Session:   Stress:   . Feeling of Stress :   Social Connections:   . Frequency of Communication with Friends and Family:   . Frequency of Social Gatherings with Friends and Family:   . Attends Religious Services:   . Active Member of Clubs or Organizations:   . Attends CArchivistMeetings:   .Marland KitchenMarital Status:     Allergies:  Allergies  Allergen Reactions  . Levothyroxine Hives and Itching  . Tylenol [Acetaminophen] Other (See Comments)    Liver function per MD     Metabolic Disorder Labs: No results found for: HGBA1C, MPG No results found for: PROLACTIN No results found for: CHOL, TRIG, HDL,  CHOLHDL, VLDL, LDLCALC Lab Results  Component Value Date   TSH 4.540 (H) 10/28/2007   TSH 1.782 11/03/2006    Therapeutic Level Labs: No results found for: LITHIUM No results found for: VALPROATE No components found for:  CBMZ  Current Medications: Current Outpatient Medications  Medication Sig Dispense Refill  . albuterol (PROVENTIL HFA;VENTOLIN HFA) 108 (90 Base) MCG/ACT inhaler Inhale 2 puffs into the lungs every 6 (six) hours as needed for wheezing or shortness of breath.    .Marland Kitchenalbuterol (PROVENTIL) (2.5 MG/3ML) 0.083% nebulizer solution Take 2.5 mg by nebulization every 6 (six) hours as needed for wheezing  or shortness of breath.    . ALPRAZolam (XANAX) 1 MG tablet Take 1 tablet (1 mg total) by mouth 4 (four) times daily. 120 tablet 2  . aspirin EC 81 MG tablet Take 81 mg by mouth daily.    [name] Calcium (STOOL SOFTENER PO) Take 2 capsules by mouth daily.     . ergocalciferol (VITAMIN D2) 1.25 MG (50000 UT) capsule Take 50,000 Units by mouth 2 (two) times a week.    Marland Kitchen FLUoxetine (PROZAC) 40 MG capsule Take 1 capsule (40 mg total) by mouth daily. 30 capsule 2  . Fluticasone-Umeclidin-Vilant (TRELEGY ELLIPTA IN) Inhale into the lungs.    . folic acid (FOLVITE) 1 MG tablet Take 2 mg by mouth daily.    Marland Kitchen gabapentin (NEURONTIN) 100 MG capsule Take 300 mg by mouth at bedtime.     . meloxicam (MOBIC) 15 MG tablet Take 1 tablet (15 mg total) by mouth daily. 30 tablet 1  . methotrexate (RHEUMATREX) 2.5 MG tablet Take 20 mg by mouth once a week.     . metoprolol succinate (TOPROL-XL) 50 MG 24 hr tablet TAKE 1 AND 1/2 TABLETS BY MOUTH TWICE DAILY. 135 tablet 3  . Multiple Vitamin (MULTI-VITAMIN PO) Take 1 tablet by mouth daily.    Marland Kitchen nystatin (MYCOSTATIN) powder Apply 1 g topically as needed (under breast).   2  . polyethylene glycol powder (MIRALAX) powder Take 17 g by mouth daily. (Patient taking differently: Take 17 g by mouth daily as needed for moderate constipation. ) 255 g 0  .  Simethicone (GAS RELIEF PO) Take 2 capsules by mouth as needed (flatulence).     . temazepam (RESTORIL) 30 MG capsule Take 1 capsule (30 mg total) by mouth at bedtime as needed. for sleep 30 capsule 3  . traMADol (ULTRAM) 50 MG tablet Take 1 tablet (50 mg total) by mouth every 6 (six) hours as needed. 40 tablet 2   No current facility-administered medications for this visit.     Musculoskeletal: Strength & Muscle Tone: within normal limits Gait & Station: normal Patient leans: N/A  Psychiatric Specialty Exam: Review of Systems  Cardiovascular: Positive for palpitations.  Musculoskeletal: Positive for arthralgias.  All other systems reviewed and are negative.   There were no vitals taken for this visit.There is no height or weight on file to calculate BMI.  General Appearance: NA  Eye Contact:  NA  Speech:  Clear and Coherent  Volume:  Normal  Mood:  Euthymic  Affect:  NA  Thought Process:  Goal Directed  Orientation:  Full (Time, Place, and Person)  Thought Content: Rumination   Suicidal Thoughts:  No  Homicidal Thoughts:  No  Memory:  Immediate;   Good Recent;   Good Remote;   Fair  Judgement:  Good  Insight:  Good  Psychomotor Activity:  Decreased  Concentration:  Concentration: Good and Attention Span: Good  Recall:  Good  Fund of Knowledge: Good  Language: Good  Akathisia:  No  Handed:  Right  AIMS (if indicated): not done  Assets:  Communication Skills Desire for Improvement Resilience Social Support Talents/Skills  ADL's:  Intact  Cognition: WNL  Sleep:  Good   Screenings:   Assessment and Plan: This patient is a 69 year old female with a history of depression and anxiety.  She seems to be doing well in terms of her mental status.  She will continue Prozac 40 mg daily for depression, Xanax 1 mg 4 times daily for anxiety and Restoril  30 mg as needed for sleep.  She will return to see me in 3 months   [name], MD 06/20/2019, 2:29 PM

## 2019-07-18 DIAGNOSIS — J441 Chronic obstructive pulmonary disease with (acute) exacerbation: Secondary | ICD-10-CM | POA: Diagnosis not present

## 2019-07-18 DIAGNOSIS — I639 Cerebral infarction, unspecified: Secondary | ICD-10-CM | POA: Diagnosis not present

## 2019-07-18 DIAGNOSIS — Z299 Encounter for prophylactic measures, unspecified: Secondary | ICD-10-CM | POA: Diagnosis not present

## 2019-07-18 DIAGNOSIS — J069 Acute upper respiratory infection, unspecified: Secondary | ICD-10-CM | POA: Diagnosis not present

## 2019-07-22 DIAGNOSIS — M069 Rheumatoid arthritis, unspecified: Secondary | ICD-10-CM | POA: Diagnosis not present

## 2019-07-28 DIAGNOSIS — M069 Rheumatoid arthritis, unspecified: Secondary | ICD-10-CM | POA: Diagnosis not present

## 2019-08-09 DIAGNOSIS — I10 Essential (primary) hypertension: Secondary | ICD-10-CM | POA: Diagnosis not present

## 2019-08-09 DIAGNOSIS — Z1211 Encounter for screening for malignant neoplasm of colon: Secondary | ICD-10-CM | POA: Diagnosis not present

## 2019-08-09 DIAGNOSIS — E7800 Pure hypercholesterolemia, unspecified: Secondary | ICD-10-CM | POA: Diagnosis not present

## 2019-08-09 DIAGNOSIS — Z7189 Other specified counseling: Secondary | ICD-10-CM | POA: Diagnosis not present

## 2019-08-09 DIAGNOSIS — Z0000 Encounter for general adult medical examination without abnormal findings: Secondary | ICD-10-CM | POA: Diagnosis not present

## 2019-08-09 DIAGNOSIS — E039 Hypothyroidism, unspecified: Secondary | ICD-10-CM | POA: Diagnosis not present

## 2019-08-09 DIAGNOSIS — Z79899 Other long term (current) drug therapy: Secondary | ICD-10-CM | POA: Diagnosis not present

## 2019-08-09 DIAGNOSIS — D696 Thrombocytopenia, unspecified: Secondary | ICD-10-CM | POA: Diagnosis not present

## 2019-08-09 DIAGNOSIS — Z299 Encounter for prophylactic measures, unspecified: Secondary | ICD-10-CM | POA: Diagnosis not present

## 2019-08-14 DIAGNOSIS — J449 Chronic obstructive pulmonary disease, unspecified: Secondary | ICD-10-CM | POA: Diagnosis not present

## 2019-08-14 DIAGNOSIS — M159 Polyosteoarthritis, unspecified: Secondary | ICD-10-CM | POA: Diagnosis not present

## 2019-08-14 DIAGNOSIS — I10 Essential (primary) hypertension: Secondary | ICD-10-CM | POA: Diagnosis not present

## 2019-08-23 DIAGNOSIS — M1990 Unspecified osteoarthritis, unspecified site: Secondary | ICD-10-CM | POA: Diagnosis not present

## 2019-09-08 DIAGNOSIS — I10 Essential (primary) hypertension: Secondary | ICD-10-CM | POA: Diagnosis not present

## 2019-09-08 DIAGNOSIS — Z789 Other specified health status: Secondary | ICD-10-CM | POA: Diagnosis not present

## 2019-09-08 DIAGNOSIS — M069 Rheumatoid arthritis, unspecified: Secondary | ICD-10-CM | POA: Diagnosis not present

## 2019-09-08 DIAGNOSIS — E538 Deficiency of other specified B group vitamins: Secondary | ICD-10-CM | POA: Diagnosis not present

## 2019-09-08 DIAGNOSIS — R5383 Other fatigue: Secondary | ICD-10-CM | POA: Diagnosis not present

## 2019-09-08 DIAGNOSIS — Z299 Encounter for prophylactic measures, unspecified: Secondary | ICD-10-CM | POA: Diagnosis not present

## 2019-09-14 DIAGNOSIS — R319 Hematuria, unspecified: Secondary | ICD-10-CM | POA: Diagnosis not present

## 2019-09-14 DIAGNOSIS — I10 Essential (primary) hypertension: Secondary | ICD-10-CM | POA: Diagnosis not present

## 2019-09-14 DIAGNOSIS — Z299 Encounter for prophylactic measures, unspecified: Secondary | ICD-10-CM | POA: Diagnosis not present

## 2019-09-14 DIAGNOSIS — R8290 Unspecified abnormal findings in urine: Secondary | ICD-10-CM | POA: Diagnosis not present

## 2019-09-14 DIAGNOSIS — I639 Cerebral infarction, unspecified: Secondary | ICD-10-CM | POA: Diagnosis not present

## 2019-09-14 DIAGNOSIS — M159 Polyosteoarthritis, unspecified: Secondary | ICD-10-CM | POA: Diagnosis not present

## 2019-09-14 DIAGNOSIS — J449 Chronic obstructive pulmonary disease, unspecified: Secondary | ICD-10-CM | POA: Diagnosis not present

## 2019-09-28 DIAGNOSIS — I10 Essential (primary) hypertension: Secondary | ICD-10-CM | POA: Diagnosis not present

## 2019-09-28 DIAGNOSIS — Z299 Encounter for prophylactic measures, unspecified: Secondary | ICD-10-CM | POA: Diagnosis not present

## 2019-09-28 DIAGNOSIS — J449 Chronic obstructive pulmonary disease, unspecified: Secondary | ICD-10-CM | POA: Diagnosis not present

## 2019-09-28 DIAGNOSIS — R350 Frequency of micturition: Secondary | ICD-10-CM | POA: Diagnosis not present

## 2019-09-28 DIAGNOSIS — N390 Urinary tract infection, site not specified: Secondary | ICD-10-CM | POA: Diagnosis not present

## 2019-09-30 ENCOUNTER — Encounter (INDEPENDENT_AMBULATORY_CARE_PROVIDER_SITE_OTHER): Payer: Self-pay | Admitting: Gastroenterology

## 2019-10-04 DIAGNOSIS — I10 Essential (primary) hypertension: Secondary | ICD-10-CM | POA: Diagnosis not present

## 2019-10-04 DIAGNOSIS — E538 Deficiency of other specified B group vitamins: Secondary | ICD-10-CM | POA: Diagnosis not present

## 2019-10-04 DIAGNOSIS — M069 Rheumatoid arthritis, unspecified: Secondary | ICD-10-CM | POA: Diagnosis not present

## 2019-10-04 DIAGNOSIS — Z299 Encounter for prophylactic measures, unspecified: Secondary | ICD-10-CM | POA: Diagnosis not present

## 2019-10-04 DIAGNOSIS — J449 Chronic obstructive pulmonary disease, unspecified: Secondary | ICD-10-CM | POA: Diagnosis not present

## 2019-10-11 DIAGNOSIS — R319 Hematuria, unspecified: Secondary | ICD-10-CM | POA: Diagnosis not present

## 2019-10-11 DIAGNOSIS — N950 Postmenopausal bleeding: Secondary | ICD-10-CM | POA: Diagnosis not present

## 2019-10-14 DIAGNOSIS — M159 Polyosteoarthritis, unspecified: Secondary | ICD-10-CM | POA: Diagnosis not present

## 2019-10-14 DIAGNOSIS — J449 Chronic obstructive pulmonary disease, unspecified: Secondary | ICD-10-CM | POA: Diagnosis not present

## 2019-10-14 DIAGNOSIS — I10 Essential (primary) hypertension: Secondary | ICD-10-CM | POA: Diagnosis not present

## 2019-10-17 ENCOUNTER — Ambulatory Visit (INDEPENDENT_AMBULATORY_CARE_PROVIDER_SITE_OTHER): Payer: Medicare Other | Admitting: Gastroenterology

## 2019-10-17 ENCOUNTER — Encounter (INDEPENDENT_AMBULATORY_CARE_PROVIDER_SITE_OTHER): Payer: Self-pay | Admitting: Gastroenterology

## 2019-10-17 ENCOUNTER — Other Ambulatory Visit: Payer: Self-pay

## 2019-10-17 DIAGNOSIS — K5903 Drug induced constipation: Secondary | ICD-10-CM | POA: Diagnosis not present

## 2019-10-17 DIAGNOSIS — Z8619 Personal history of other infectious and parasitic diseases: Secondary | ICD-10-CM | POA: Insufficient documentation

## 2019-10-17 DIAGNOSIS — K581 Irritable bowel syndrome with constipation: Secondary | ICD-10-CM | POA: Diagnosis not present

## 2019-10-17 DIAGNOSIS — K589 Irritable bowel syndrome without diarrhea: Secondary | ICD-10-CM | POA: Insufficient documentation

## 2019-10-17 DIAGNOSIS — K5900 Constipation, unspecified: Secondary | ICD-10-CM | POA: Diagnosis not present

## 2019-10-17 MED ORDER — LINACLOTIDE 72 MCG PO CAPS: 72.0000 ug | ORAL_CAPSULE | Freq: Every day | ORAL | 3 refills | Status: DC

## 2019-10-17 NOTE — Patient Instructions (Signed)
Start Linzess 72 mcg qday Take prune juice and eat kiwi daily Perform blood workup RTC 3 months

## 2019-10-17 NOTE — Progress Notes (Signed)
[name], M.D. Gastroenterology & Hepatology [facility] [address] [place], [place] [address] Primary Care Physician: Glenda Chroman, MD [address] [place] [address]  Referring MD: self  I will communicate my assessment and recommendations to the referring MD via EMR. Note: Occasional unusual wording and randomly placed punctuation marks may result from the use of speech recognition technology to transcribe this document"  Chief Complaint:  Constipation  History of Present Illness: [name] is a 69 y.o. female with past medical history of hepatitis C status post treatment in remission, anxiety, rheumatoid arthritis, COPD, GERD, hypertension, hypothyroidism, ?SVT,  recurrent diverticulitis s/p partial colectomy, who presents for evaluation of constipation.  Patient reports that at least the last 10 years she has had issues with constipation, which have worsened for the last 6 months. 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 The patient reports that she currently is having a BM every 2-3 days but has to strain significantly when she moves her bowels. Stools are significantly hard. She currently takes a stool softener daily (docusate) and Metamucil 1 cap every day without any improvement. She has had to use manual maneuvers to remove her stool. She has tried taking kiwi and prunes without any improvement. She reports that she is having constant pain her LUQ, which gets worse when she can't have a BM. She also endorses feeling bloating. States having some nausea occasionally. She has lost 9 lb in the last 2 months she does not know why. The patient denies having any vomiting, fever, chills, hematochezia, melena, hematemesis, diarrhea, jaundice, pruritus.  Patient reports that in the past she used to follow with Dr. Britta Mccreedy for history of hepatitis C.  She reports that in 2014 she received a 12 week course of an antiviral for Hep C but  she does not know the name of the medication, likely Harvoni given the time the medication was prescribed.  She also reports a history of recurrent diverticulitis in the past.  She reports that she underwent a partial colectomy in [facility] for this, she believes 6 inches of her bowel removed but she is not sure of this.  Notably, she reports that she takes Tramadol 1-2 tablets a day for pain.  I personally reviewed and interpreted the available lab results which showed: Fax report from 11/26/2018 showed negative HCV RNA.  CMP from same date shows AST of 24, ALT 22 with normal limits, normal alkaline phosphatase 85, normal total bilirubin 0.4, albumin 4.4 normal.  Normal electrolytes and renal function.  The patient also has CBC showing mild thrombocytopenia of 139, normal hemoglobin 12.8 and with a cell count 5.3.  Last EGD: 03/28/2014-small sliding hiatal hernia Last Colonoscopy:03/2011 -presence of moderate diverticulosis in the sigmoid colon.  No other alterations were found.  FHx: neg for any gastrointestinal/liver disease, daughter ovarian cancer, mother breast cancer, brothers lung cancer x2, grandmother uterine cancer Social: neg smoking, alcohol or illicit drug use  Past Medical History: Past Medical History:  Diagnosis Date  . Anxiety   . Arthritis   . COPD (chronic obstructive pulmonary disease) ([place])   . Depression   . Diverticulitis 04/2012   [facility]  . Essential hypertension   . GERD (gastroesophageal reflux disease)   . Hepatitis C    Treated  . History of pneumonia   . Insomnia   . Migraine headache   . Splenomegaly   . Thyroid disease   . Wears glasses  Past Surgical History: Past Surgical History:  Procedure Laterality Date  . ABDOMINAL HYSTERECTOMY    . ANKLE SURGERY Right   . COLON SURGERY     "part of color removed"  . COLONOSCOPY    . ESOPHAGEAL MANOMETRY N/A 04/10/2014   Procedure: ESOPHAGEAL MANOMETRY (EM);  Surgeon: Gatha Mayer, MD;   [facility];  Service: Endoscopy;  Laterality: N/A;  . ESOPHAGOGASTRODUODENOSCOPY    . INCISIONAL HERNIA REPAIR N/A 10/24/2015   Procedure: LAPAROSCOPIC REPAIR INCISIONAL HERNIA WITH MESH;  Surgeon: Georganna Skeans, MD;  Location: [place];  Service: General;  Laterality: N/A;  . INSERTION OF MESH N/A 10/24/2015   Procedure: INSERTION OF MESH;  Surgeon: Georganna Skeans, MD;  Location: [place];  Service: General;  Laterality: N/A;  . JOINT REPLACEMENT    . KNEE SURGERY Right    x4  . [place] IMPEDANCE STUDY N/A 04/10/2014   Procedure: [place] IMPEDANCE STUDY;  Surgeon: Gatha Mayer, MD;  [facility];  Service: Endoscopy;  Laterality: N/A;  . TOTAL SHOULDER ARTHROPLASTY Left 04/22/2018   Procedure: Left total shoulder arthroplasty;  Surgeon: Justice Britain, MD;  [facility];  Service: Orthopedics;  Laterality: Left;  144mn    Family History: Family History  Problem Relation Age of Onset  . Alcohol abuse Father   . Dementia Father   . Dementia Maternal Grandmother   . Bipolar disorder Daughter   . Anxiety disorder Daughter   . Depression Brother   . Drug abuse Brother   . Alcohol abuse Paternal Uncle   . Depression Paternal Uncle   . ADD / ADHD Neg Hx     Social History: Social History   Tobacco Use  Smoking Status Never Smoker  Smokeless Tobacco Never Used   Social History   Substance and Sexual Activity  Alcohol Use Not Currently  . Alcohol/week: 3.0 standard drinks  . Types: 3 Cans of beer per week   Social History   Substance and Sexual Activity  Drug Use No    Allergies: Allergies  Allergen Reactions  . Levothyroxine Hives and Itching  . Tylenol [Acetaminophen] Other (See Comments)    Liver function per MD     Medications: Current Outpatient Medications  Medication Sig Dispense Refill  . albuterol (PROVENTIL HFA;VENTOLIN HFA) 108 (90 Base) MCG/ACT inhaler Inhale 2 puffs into the lungs every 6 (six) hours as needed for wheezing or shortness of  breath.    .Marland Kitchenalbuterol (PROVENTIL) (2.5 MG/3ML) 0.083% nebulizer solution Take 2.5 mg by nebulization every 6 (six) hours as needed for wheezing or shortness of breath.    . ALPRAZolam (XANAX) 1 MG tablet Take 1 tablet (1 mg total) by mouth 4 (four) times daily. 120 tablet 2  . aspirin EC 81 MG tablet Take 81 mg by mouth daily.    . Cyanocobalamin (VITAMIN B-12 IJ) Inject as directed every 30 (thirty) days.    .Mariane BaumgartenCalcium (STOOL SOFTENER PO) Take 2 capsules by mouth daily.     . ergocalciferol (VITAMIN D2) 1.25 MG (50000 UT) capsule Take 50,000 Units by mouth 2 (two) times a week.    .Marland KitchenFLUoxetine (PROZAC) 40 MG capsule Take 1 capsule (40 mg total) by mouth daily. 30 capsule 2  . Fluticasone-Umeclidin-Vilant (TRELEGY ELLIPTA IN) Inhale into the lungs as needed.     . folic acid (FOLVITE) 1 MG tablet Take 2 mg by mouth daily.    .Marland Kitchengabapentin (NEURONTIN) 100 MG capsule Take 300 mg by mouth at  bedtime.     . meloxicam (MOBIC) 15 MG tablet Take 1 tablet (15 mg total) by mouth daily. 30 tablet 1  . methotrexate (RHEUMATREX) 2.5 MG tablet Take 20 mg by mouth once a week.     . metoprolol succinate (TOPROL-XL) 50 MG 24 hr tablet TAKE 1 AND 1/2 TABLETS BY MOUTH TWICE DAILY. 135 tablet 3  . Multiple Vitamin (MULTI-VITAMIN PO) Take 1 tablet by mouth daily.    Marland Kitchen nystatin (MYCOSTATIN) powder Apply 1 g topically as needed (under breast).   2  . polyethylene glycol powder (MIRALAX) powder Take 17 g by mouth daily. (Patient taking differently: Take 17 g by mouth daily as needed for moderate constipation. ) 255 g 0  . Simethicone (GAS RELIEF PO) Take 2 capsules by mouth as needed (flatulence).     . temazepam (RESTORIL) 30 MG capsule Take 1 capsule (30 mg total) by mouth at bedtime as needed. for sleep 30 capsule 3  . traMADol (ULTRAM) 50 MG tablet Take 1 tablet (50 mg total) by mouth every 6 (six) hours as needed. 40 tablet 2  . linaclotide (LINZESS) 72 MCG capsule Take 1 capsule (72 mcg total) by mouth  daily. 30 capsule 3   No current facility-administered medications for this visit.    Review of Systems: GENERAL: negative for malaise, night sweats HEENT: No changes in hearing or vision, no nose bleeds or other nasal problems. NECK: Negative for lumps, goiter, pain and significant neck swelling RESPIRATORY: Negative for cough, wheezing CARDIOVASCULAR: Negative for chest pain, leg swelling, palpitations, orthopnea GI: SEE HPI MUSCULOSKELETAL: Negative for joint pain or swelling, back pain, and muscle pain. SKIN: Negative for lesions, rash PSYCH: Negative for sleep disturbance, mood disorder and recent psychosocial stressors. HEMATOLOGY Negative for prolonged bleeding, bruising easily, and swollen nodes. ENDOCRINE: Negative for cold or heat intolerance, polyuria, polydipsia and goiter. NEURO: negative for tremor, gait imbalance, syncope and seizures. The remainder of the review of systems is noncontributory.   Physical Exam: BP (!) 155/59 (BP Location: Right Arm, Patient Position: Sitting, Cuff Size: Large)   Pulse 61   Temp 97.6 F (36.4 C) (Oral)   Ht 5' 4"  (1.626 m)   Wt 215 lb 9.6 oz (97.8 kg)   BMI 37.01 kg/m  GENERAL: The patient is AO x3, in no acute distress. HEENT: Head is normocephalic and atraumatic. EOMI are intact. Mouth is well hydrated and without lesions. NECK: Supple. No masses LUNGS: Clear to auscultation. No presence of rhonchi/wheezing/rales. Adequate chest expansion HEART: RRR, normal s1 and s2. ABDOMEN: Soft, nontender, no guarding, no peritoneal signs, and nondistended. BS +. No masses. EXTREMITIES: Without any cyanosis, clubbing, rash, lesions or edema. NEUROLOGIC: AOx3, no focal motor deficit. SKIN: no jaundice, no rashes   Imaging/Labs: as above  I personally reviewed and interpreted the available labs, imaging and endoscopic files.  Impression and Plan: BIRD TAILOR is a 69 y.o. female with past medical history of hepatitis C status post  treatment in remission, anxiety, rheumatoid arthritis, COPD, GERD, hypertension, hypothyroidism, ?SVT,  recurrent diverticulitis s/p partial colectomy, who presents for evaluation of constipation.  Regarding her constipation, the patient has a longstanding history of disease which has been treated with over-the-counter medications such as docusate and fiber without significant improvement.  She is currently presenting presence of bloating and discomfort in her abdomen, likely consistent with IBS-C.  However she is also taking opiates on a daily basis which is likely making her situation more complicated.  At this moment,  I recommended her to start taking Linzess 72 mcg every day along with increasing her dietary intake of prunes or kiwi.  There is no need for repeat colonoscopy at this point as she had one less than 10 years ago which was unremarkable for any alterations.  It would be important to clarify the type of surgery and extension of colon removed that she had in the past - we will request these records from [facility].  We will check TSH levels as part of her comprehensive evaluation for chronic constipation.  Finally, the patient had blood testing the last year that showed no presence of hepatitis C virus, no need for further management is warranted at this point.  -Start Linzess 72 mcg qday -Take prune juice and eat kiwi daily -Perform blood workup -RTC 3 months  All questions were answered.      [name], MD Gastroenterology and Hepatology [facility]

## 2019-10-18 ENCOUNTER — Telehealth (INDEPENDENT_AMBULATORY_CARE_PROVIDER_SITE_OTHER): Payer: Self-pay | Admitting: Gastroenterology

## 2019-10-18 ENCOUNTER — Encounter (INDEPENDENT_AMBULATORY_CARE_PROVIDER_SITE_OTHER): Payer: Self-pay | Admitting: Gastroenterology

## 2019-10-18 LAB — TSH: TSH: 3.26 mIU/L (ref 0.40–4.50)

## 2019-10-18 NOTE — Progress Notes (Signed)
Received the surgical history of [name] at [facility].  There was only report of bilateral endoscopic maxillary sinus antrostomy with removal of tissue and balloon sinuplasty, right endoscopic concha bullosa resection.  No colonic surgeries were mentioned.  [name], MD Gastroenterology and Hepatology [facility]

## 2019-10-18 NOTE — Telephone Encounter (Signed)
I spoke with the patient today, told her about the results of her TSH which was normal.  The patient will continue taking her Linzess and with dietary changes are recommended.  [name], MD Gastroenterology and Hepatology [facility]

## 2019-10-18 NOTE — Telephone Encounter (Signed)
patient left voice mail message stating she got the message you left - she would like you to call her back at [phone]

## 2019-10-18 NOTE — Telephone Encounter (Signed)
Called patient to inform her about normal TSH results, did not pick up the phone. Left voice message.  [name], MD Gastroenterology and Hepatology [facility]

## 2019-10-19 ENCOUNTER — Other Ambulatory Visit (HOSPITAL_COMMUNITY): Payer: Self-pay | Admitting: Psychiatry

## 2019-10-19 NOTE — Telephone Encounter (Signed)
Call for appt

## 2019-10-19 NOTE — Telephone Encounter (Signed)
Patient has an appt for 11-01-2019

## 2019-10-21 DIAGNOSIS — Z789 Other specified health status: Secondary | ICD-10-CM | POA: Diagnosis not present

## 2019-10-21 DIAGNOSIS — Z299 Encounter for prophylactic measures, unspecified: Secondary | ICD-10-CM | POA: Diagnosis not present

## 2019-10-21 DIAGNOSIS — I10 Essential (primary) hypertension: Secondary | ICD-10-CM | POA: Diagnosis not present

## 2019-10-21 DIAGNOSIS — J449 Chronic obstructive pulmonary disease, unspecified: Secondary | ICD-10-CM | POA: Diagnosis not present

## 2019-10-21 DIAGNOSIS — M069 Rheumatoid arthritis, unspecified: Secondary | ICD-10-CM | POA: Diagnosis not present

## 2019-10-21 DIAGNOSIS — M545 Low back pain: Secondary | ICD-10-CM | POA: Diagnosis not present

## 2019-10-21 DIAGNOSIS — R319 Hematuria, unspecified: Secondary | ICD-10-CM | POA: Diagnosis not present

## 2019-10-24 DIAGNOSIS — M5136 Other intervertebral disc degeneration, lumbar region: Secondary | ICD-10-CM | POA: Diagnosis not present

## 2019-10-24 DIAGNOSIS — M4316 Spondylolisthesis, lumbar region: Secondary | ICD-10-CM | POA: Diagnosis not present

## 2019-10-24 DIAGNOSIS — M47896 Other spondylosis, lumbar region: Secondary | ICD-10-CM | POA: Diagnosis not present

## 2019-10-24 DIAGNOSIS — M4317 Spondylolisthesis, lumbosacral region: Secondary | ICD-10-CM | POA: Diagnosis not present

## 2019-10-24 DIAGNOSIS — M545 Low back pain: Secondary | ICD-10-CM | POA: Diagnosis not present

## 2019-11-01 ENCOUNTER — Telehealth (INDEPENDENT_AMBULATORY_CARE_PROVIDER_SITE_OTHER): Payer: Medicare Other | Admitting: Psychiatry

## 2019-11-01 ENCOUNTER — Encounter (HOSPITAL_COMMUNITY): Payer: Self-pay | Admitting: Psychiatry

## 2019-11-01 ENCOUNTER — Other Ambulatory Visit: Payer: Self-pay

## 2019-11-01 DIAGNOSIS — F331 Major depressive disorder, recurrent, moderate: Secondary | ICD-10-CM

## 2019-11-01 MED ORDER — TEMAZEPAM 30 MG PO CAPS: 30.0000 mg | ORAL_CAPSULE | Freq: Every evening | ORAL | 3 refills | Status: DC | PRN

## 2019-11-01 MED ORDER — FLUOXETINE HCL 40 MG PO CAPS: 40.0000 mg | ORAL_CAPSULE | Freq: Every day | ORAL | 2 refills | Status: DC

## 2019-11-01 MED ORDER — ALPRAZOLAM 1 MG PO TABS: 1.0000 mg | ORAL_TABLET | Freq: Four times a day (QID) | ORAL | 2 refills | Status: DC

## 2019-11-01 NOTE — Progress Notes (Signed)
Virtual Visit via Telephone Note  I connected with [name] on 11/01/19 at  1:20 PM EDT by telephone and verified that I am speaking with the correct person using two identifiers.   I discussed the limitations, risks, security and privacy concerns of performing an evaluation and management service by telephone and the availability of in person appointments. I also discussed with the patient that there may be a patient responsible charge related to this service. The patient expressed understanding and agreed to proceed    I discussed the assessment and treatment plan with the patient. The patient was provided an opportunity to ask questions and all were answered. The patient agreed with the plan and demonstrated an understanding of the instructions.   The patient was advised to call back or seek an in-person evaluation if the symptoms worsen or if the condition fails to improve as anticipated.  I provided 15 minutes of non-face-to-face time during this encounter. Location: Provider Home, patient home  [name], MD  [facility] MD/PA/NP OP Progress Note  11/01/2019 1:38 PM [name]  MRN:  [id number]  Chief Complaint:  Chief Complaint    Anxiety; Depression; Follow-up     HPI: This patient is a 69 year old widowed white female who lives alone in [place].  Her daughter and brothers live nearby.  She has 1 son in [place].  She is on disability.  The patient returns after 3 months for follow-up regarding her depression and anxiety.  She states for the most part she is doing okay.  She still has been having some problems with PVCs as well as hematuria.  She had a recent UTI.  She also has chronic back pain and degenerative disc disease.  She recently got put on cyclobenzaprine which seems to be helping her back.  She is finally gotten her coronavirus vaccine.  Nevertheless she is not getting out much and mostly staying around home.  The patient states that her mood is stable and she denies  serious depression or suicidal ideation her anxiety is well controlled with Xanax and overall she is sleeping well Visit Diagnosis: major depression  Past Psychiatric History: none  Past Medical History:  Past Medical History:  Diagnosis Date  . Anxiety   . Arthritis   . COPD (chronic obstructive pulmonary disease) ([place])   . Depression   . Diverticulitis 04/2012   [facility]  . Essential hypertension   . GERD (gastroesophageal reflux disease)   . Hepatitis C    Treated  . History of pneumonia   . Insomnia   . Migraine headache   . Splenomegaly   . Thyroid disease   . Wears glasses     Past Surgical History:  Procedure Laterality Date  . ABDOMINAL HYSTERECTOMY    . ANKLE SURGERY Right   . COLON SURGERY     "part of color removed"  . COLONOSCOPY    . ESOPHAGEAL MANOMETRY N/A 04/10/2014   Procedure: ESOPHAGEAL MANOMETRY (EM);  Surgeon: Gatha Mayer, MD;  [facility];  Service: Endoscopy;  Laterality: N/A;  . ESOPHAGOGASTRODUODENOSCOPY    . INCISIONAL HERNIA REPAIR N/A 10/24/2015   Procedure: LAPAROSCOPIC REPAIR INCISIONAL HERNIA WITH MESH;  Surgeon: Georganna Skeans, MD;  Location: [place];  Service: General;  Laterality: N/A;  . INSERTION OF MESH N/A 10/24/2015   Procedure: INSERTION OF MESH;  Surgeon: Georganna Skeans, MD;  Location: [place];  Service: General;  Laterality: N/A;  . JOINT REPLACEMENT    . KNEE  SURGERY Right    x4  . [place] IMPEDANCE STUDY N/A 04/10/2014   Procedure: [place] IMPEDANCE STUDY;  Surgeon: Gatha Mayer, MD;  [facility];  Service: Endoscopy;  Laterality: N/A;  . TOTAL SHOULDER ARTHROPLASTY Left 04/22/2018   Procedure: Left total shoulder arthroplasty;  Surgeon: Justice Britain, MD;  [facility];  Service: Orthopedics;  Laterality: Left;  179mn    Family Psychiatric History: see below  Family History:  Family History  Problem Relation Age of Onset  . Alcohol abuse Father   . Dementia Father   . Dementia Maternal  Grandmother   . Bipolar disorder Daughter   . Anxiety disorder Daughter   . Depression Brother   . Drug abuse Brother   . Alcohol abuse Paternal Uncle   . Depression Paternal Uncle   . ADD / ADHD Neg Hx     Social History:  Social History   Socioeconomic History  . Marital status: Widowed    Spouse name: Not on file  . Number of children: 2  . Years of education: 12th grade  . Highest education level: Not on file  Occupational History  . Occupation: RETIRED  Tobacco Use  . Smoking status: Never Smoker  . Smokeless tobacco: Never Used  Vaping Use  . Vaping Use: Never used  Substance and Sexual Activity  . Alcohol use: Not Currently    Alcohol/week: 3.0 standard drinks    Types: 3 Cans of beer per week  . Drug use: No  . Sexual activity: Never  Other Topics Concern  . Not on file  Social History Narrative  . Not on file   Social Determinants of Health   Financial Resource Strain:   . Difficulty of Paying Living Expenses:   Food Insecurity:   . Worried About RCharity fundraiserin the Last Year:   . RArboriculturistin the Last Year:   Transportation Needs:   . LFilm/video editor(Medical):   .Marland KitchenLack of Transportation (Non-Medical):   Physical Activity:   . Days of Exercise per Week:   . Minutes of Exercise per Session:   Stress:   . Feeling of Stress :   Social Connections:   . Frequency of Communication with Friends and Family:   . Frequency of Social Gatherings with Friends and Family:   . Attends Religious Services:   . Active Member of Clubs or Organizations:   . Attends CArchivistMeetings:   .Marland KitchenMarital Status:     Allergies:  Allergies  Allergen Reactions  . Levothyroxine Hives and Itching  . Tylenol [Acetaminophen] Other (See Comments)    Liver function per MD     Metabolic Disorder Labs: No results found for: HGBA1C, MPG No results found for: PROLACTIN No results found for: CHOL, TRIG, HDL, CHOLHDL, VLDL, LDLCALC Lab Results   Component Value Date   TSH 3.26 10/17/2019   TSH 4.540 (H) 10/28/2007    Therapeutic Level Labs: No results found for: LITHIUM No results found for: VALPROATE No components found for:  CBMZ  Current Medications: Current Outpatient Medications  Medication Sig Dispense Refill  . albuterol (PROVENTIL HFA;VENTOLIN HFA) 108 (90 Base) MCG/ACT inhaler Inhale 2 puffs into the lungs every 6 (six) hours as needed for wheezing or shortness of breath.    .Marland Kitchenalbuterol (PROVENTIL) (2.5 MG/3ML) 0.083% nebulizer solution Take 2.5 mg by nebulization every 6 (six) hours as needed for wheezing or shortness of breath.    . ALPRAZolam (  XANAX) 1 MG tablet Take 1 tablet (1 mg total) by mouth 4 (four) times daily. 120 tablet 2  . aspirin EC 81 MG tablet Take 81 mg by mouth daily.    . Cyanocobalamin (VITAMIN B-12 IJ) Inject as directed every 30 (thirty) days.    . cyclobenzaprine (FLEXERIL) 10 MG tablet Take 10 mg by mouth every 8 (eight) hours as needed.    [name] Calcium (STOOL SOFTENER PO) Take 2 capsules by mouth daily.     . ergocalciferol (VITAMIN D2) 1.25 MG (50000 UT) capsule Take 50,000 Units by mouth 2 (two) times a week.    Marland Kitchen FLUoxetine (PROZAC) 40 MG capsule Take 1 capsule (40 mg total) by mouth daily. 30 capsule 2  . Fluticasone-Umeclidin-Vilant (TRELEGY ELLIPTA IN) Inhale into the lungs as needed.     . folic acid (FOLVITE) 1 MG tablet Take 2 mg by mouth daily.    Marland Kitchen gabapentin (NEURONTIN) 100 MG capsule Take 300 mg by mouth at bedtime.     Marland Kitchen linaclotide (LINZESS) 72 MCG capsule Take 1 capsule (72 mcg total) by mouth daily. 30 capsule 3  . meloxicam (MOBIC) 15 MG tablet Take 1 tablet (15 mg total) by mouth daily. 30 tablet 1  . methotrexate (RHEUMATREX) 2.5 MG tablet Take 20 mg by mouth once a week.     . metoprolol succinate (TOPROL-XL) 50 MG 24 hr tablet TAKE 1 AND 1/2 TABLETS BY MOUTH TWICE DAILY. 135 tablet 3  . Multiple Vitamin (MULTI-VITAMIN PO) Take 1 tablet by mouth daily.    Marland Kitchen  nystatin (MYCOSTATIN) powder Apply 1 g topically as needed (under breast).   2  . polyethylene glycol powder (MIRALAX) powder Take 17 g by mouth daily. (Patient taking differently: Take 17 g by mouth daily as needed for moderate constipation. ) 255 g 0  . Simethicone (GAS RELIEF PO) Take 2 capsules by mouth as needed (flatulence).     . temazepam (RESTORIL) 30 MG capsule Take 1 capsule (30 mg total) by mouth at bedtime as needed. for sleep 30 capsule 3  . traMADol (ULTRAM) 50 MG tablet Take 1 tablet (50 mg total) by mouth every 6 (six) hours as needed. 40 tablet 2   No current facility-administered medications for this visit.     Musculoskeletal: Strength & Muscle Tone: within normal limits Gait & Station: normal Patient leans: N/A  Psychiatric Specialty Exam: Review of Systems  Gastrointestinal: Positive for blood in stool.  Musculoskeletal: Positive for arthralgias, back pain and myalgias.  All other systems reviewed and are negative.   There were no vitals taken for this visit.There is no height or weight on file to calculate BMI.  General Appearance: NA  Eye Contact:  NA  Speech:  Clear and Coherent  Volume:  Normal  Mood:  Anxious  Affect:  NA  Thought Process:  Goal Directed  Orientation:  Full (Time, Place, and Person)  Thought Content: Rumination   Suicidal Thoughts:  No  Homicidal Thoughts:  No  Memory:  Immediate;   Good Recent;   Good Remote;   Fair  Judgement:  Good  Insight:  Fair  Psychomotor Activity:  Decreased  Concentration:  Concentration: Good and Attention Span: Good  Recall:  Good  Fund of Knowledge: Fair  Language: Good  Akathisia:  No  Handed:  Right  AIMS (if indicated): not done  Assets:  Communication Skills Desire for Improvement Resilience Social Support Talents/Skills  ADL's:  Intact  Cognition: WNL  Sleep:  Fair  Screenings:   Assessment and Plan: Patient is a 69 year old female with a history of depression and anxiety.   Overall she is doing fairly well in terms of her depression and anxiety.  She will continue Prozac 40 mg daily for depression, Xanax 1 mg 4 times daily for anxiety and Restoril 30 mg as needed for sleep.  She was warned not to mix her new muscle relaxer with any of her psychiatric drugs.  She will return to see me in 3 months   [name], MD 11/01/2019, 1:38 PM

## 2019-11-04 DIAGNOSIS — Z299 Encounter for prophylactic measures, unspecified: Secondary | ICD-10-CM | POA: Diagnosis not present

## 2019-11-04 DIAGNOSIS — E538 Deficiency of other specified B group vitamins: Secondary | ICD-10-CM | POA: Diagnosis not present

## 2019-11-04 DIAGNOSIS — J449 Chronic obstructive pulmonary disease, unspecified: Secondary | ICD-10-CM | POA: Diagnosis not present

## 2019-11-04 DIAGNOSIS — M069 Rheumatoid arthritis, unspecified: Secondary | ICD-10-CM | POA: Diagnosis not present

## 2019-11-04 DIAGNOSIS — K219 Gastro-esophageal reflux disease without esophagitis: Secondary | ICD-10-CM | POA: Diagnosis not present

## 2019-11-04 DIAGNOSIS — I10 Essential (primary) hypertension: Secondary | ICD-10-CM | POA: Diagnosis not present

## 2019-11-10 DIAGNOSIS — I10 Essential (primary) hypertension: Secondary | ICD-10-CM | POA: Diagnosis not present

## 2019-11-10 DIAGNOSIS — J449 Chronic obstructive pulmonary disease, unspecified: Secondary | ICD-10-CM | POA: Diagnosis not present

## 2019-11-10 DIAGNOSIS — M159 Polyosteoarthritis, unspecified: Secondary | ICD-10-CM | POA: Diagnosis not present

## 2019-11-17 ENCOUNTER — Other Ambulatory Visit: Payer: Self-pay | Admitting: Cardiology

## 2019-11-17 DIAGNOSIS — R079 Chest pain, unspecified: Secondary | ICD-10-CM

## 2019-11-17 DIAGNOSIS — I471 Supraventricular tachycardia: Secondary | ICD-10-CM

## 2019-12-05 DIAGNOSIS — M069 Rheumatoid arthritis, unspecified: Secondary | ICD-10-CM | POA: Diagnosis not present

## 2019-12-09 DIAGNOSIS — Z299 Encounter for prophylactic measures, unspecified: Secondary | ICD-10-CM | POA: Diagnosis not present

## 2019-12-09 DIAGNOSIS — M069 Rheumatoid arthritis, unspecified: Secondary | ICD-10-CM | POA: Diagnosis not present

## 2019-12-09 DIAGNOSIS — D849 Immunodeficiency, unspecified: Secondary | ICD-10-CM | POA: Diagnosis not present

## 2019-12-09 DIAGNOSIS — I10 Essential (primary) hypertension: Secondary | ICD-10-CM | POA: Diagnosis not present

## 2019-12-09 DIAGNOSIS — E538 Deficiency of other specified B group vitamins: Secondary | ICD-10-CM | POA: Diagnosis not present

## 2019-12-15 ENCOUNTER — Other Ambulatory Visit (HOSPITAL_COMMUNITY): Payer: Self-pay | Admitting: Psychiatry

## 2019-12-15 DIAGNOSIS — M159 Polyosteoarthritis, unspecified: Secondary | ICD-10-CM | POA: Diagnosis not present

## 2019-12-15 DIAGNOSIS — I10 Essential (primary) hypertension: Secondary | ICD-10-CM | POA: Diagnosis not present

## 2019-12-15 DIAGNOSIS — J449 Chronic obstructive pulmonary disease, unspecified: Secondary | ICD-10-CM | POA: Diagnosis not present

## 2019-12-16 DIAGNOSIS — Z299 Encounter for prophylactic measures, unspecified: Secondary | ICD-10-CM | POA: Diagnosis not present

## 2019-12-16 DIAGNOSIS — I739 Peripheral vascular disease, unspecified: Secondary | ICD-10-CM | POA: Diagnosis not present

## 2019-12-16 DIAGNOSIS — D696 Thrombocytopenia, unspecified: Secondary | ICD-10-CM | POA: Diagnosis not present

## 2019-12-16 DIAGNOSIS — I10 Essential (primary) hypertension: Secondary | ICD-10-CM | POA: Diagnosis not present

## 2019-12-16 DIAGNOSIS — R5383 Other fatigue: Secondary | ICD-10-CM | POA: Diagnosis not present

## 2019-12-25 ENCOUNTER — Encounter: Payer: Self-pay | Admitting: Cardiology

## 2019-12-25 NOTE — Progress Notes (Signed)
Cardiology Office Note  Date: 12/26/2019   ID: [name], DOB 11-25-1950, MRN [id number]  PCP:  Glenda Chroman, MD  Cardiologist:  Rozann Lesches, MD Electrophysiologist:  None   Chief Complaint  Patient presents with  . Cardiac follow-up    History of Present Illness: [name] is a 69 y.o. female last assessed via telehealth encounter in January.  She presents for a routine visit.  States that she has been noticing more of a sense of palpitations and shortness of breath, no syncope.  She has been compliant with her medications including Toprol XL at 75 mg twice daily.  I personally reviewed her ECG today which shows sinus rhythm with frequent PVCs, upright in the inferior leads, otherwise nonspecific ST changes.  Past Medical History:  Diagnosis Date  . Anxiety   . Arthritis   . COPD (chronic obstructive pulmonary disease) ([place])   . Depression   . Diverticulitis 04/2012   [facility]  . Essential hypertension   . Frequent PVCs    Outflow tract, positive in the inferior leads  . GERD (gastroesophageal reflux disease)   . Hepatitis C    Treated  . History of pneumonia   . Insomnia   . Migraine headache   . Splenomegaly   . Thyroid disease   . Wears glasses     Past Surgical History:  Procedure Laterality Date  . ABDOMINAL HYSTERECTOMY    . ANKLE SURGERY Right   . COLON SURGERY     "part of color removed"  . COLONOSCOPY    . ESOPHAGEAL MANOMETRY N/A 04/10/2014   Procedure: ESOPHAGEAL MANOMETRY (EM);  Surgeon: Gatha Mayer, MD;  [facility];  Service: Endoscopy;  Laterality: N/A;  . ESOPHAGOGASTRODUODENOSCOPY    . INCISIONAL HERNIA REPAIR N/A 10/24/2015   Procedure: LAPAROSCOPIC REPAIR INCISIONAL HERNIA WITH MESH;  Surgeon: Georganna Skeans, MD;  Location: [place];  Service: General;  Laterality: N/A;  . INSERTION OF MESH N/A 10/24/2015   Procedure: INSERTION OF MESH;  Surgeon: Georganna Skeans, MD;  Location: [place];  Service: General;   Laterality: N/A;  . JOINT REPLACEMENT    . KNEE SURGERY Right    x4  . [place] IMPEDANCE STUDY N/A 04/10/2014   Procedure: [place] IMPEDANCE STUDY;  Surgeon: Gatha Mayer, MD;  [facility];  Service: Endoscopy;  Laterality: N/A;  . TOTAL SHOULDER ARTHROPLASTY Left 04/22/2018   Procedure: Left total shoulder arthroplasty;  Surgeon: Justice Britain, MD;  [facility]: [facility];  Service: Orthopedics;  Laterality: Left;  128mn    Current Outpatient Medications  Medication Sig Dispense Refill  . albuterol (PROVENTIL HFA;VENTOLIN HFA) 108 (90 Base) MCG/ACT inhaler Inhale 2 puffs into the lungs every 6 (six) hours as needed for wheezing or shortness of breath.    .Marland Kitchenalbuterol (PROVENTIL) (2.5 MG/3ML) 0.083% nebulizer solution Take 2.5 mg by nebulization every 6 (six) hours as needed for wheezing or shortness of breath.    . ALPRAZolam (XANAX) 1 MG tablet Take 1 tablet (1 mg total) by mouth 4 (four) times daily. 120 tablet 2  . aspirin EC 81 MG tablet Take 81 mg by mouth daily.    . Cyanocobalamin (VITAMIN B-12 IJ) Inject as directed every 30 (thirty) days.    . cyclobenzaprine (FLEXERIL) 10 MG tablet Take 10 mg by mouth every 8 (eight) hours as needed.    .Mariane BaumgartenCalcium (STOOL SOFTENER PO) Take 2 capsules by mouth daily.     .Marland Kitchen  ergocalciferol (VITAMIN D2) 1.25 MG (50000 UT) capsule Take 50,000 Units by mouth 2 (two) times a week.    Marland Kitchen FLUoxetine (PROZAC) 40 MG capsule Take 1 capsule (40 mg total) by mouth daily. 30 capsule 2  . Fluticasone-Umeclidin-Vilant (TRELEGY ELLIPTA IN) Inhale into the lungs as needed.     . folic acid (FOLVITE) 1 MG tablet Take 2 mg by mouth daily.    Marland Kitchen gabapentin (NEURONTIN) 100 MG capsule Take 300 mg by mouth at bedtime.     . meloxicam (MOBIC) 15 MG tablet Take 1 tablet (15 mg total) by mouth daily. 30 tablet 1  . methotrexate (RHEUMATREX) 2.5 MG tablet Take 20 mg by mouth once a week.     . Multiple Vitamin (MULTI-VITAMIN PO) Take 1 tablet by mouth daily.    Marland Kitchen nystatin  (MYCOSTATIN) powder Apply 1 g topically as needed (under breast).   2  . polyethylene glycol powder (MIRALAX) powder Take 17 g by mouth daily. (Patient taking differently: Take 17 g by mouth daily as needed for moderate constipation. ) 255 g 0  . Simethicone (GAS RELIEF PO) Take 2 capsules by mouth as needed (flatulence).     . temazepam (RESTORIL) 30 MG capsule Take 1 capsule (30 mg total) by mouth at bedtime as needed. for sleep 30 capsule 3  . traMADol (ULTRAM) 50 MG tablet Take 1 tablet (50 mg total) by mouth every 6 (six) hours as needed. 40 tablet 2  . linaclotide (LINZESS) 72 MCG capsule Take 1 capsule (72 mcg total) by mouth daily. 30 capsule 3  . metoprolol succinate (TOPROL-XL) 100 MG 24 hr tablet Take 1 tablet (100 mg total) by mouth 2 (two) times daily. Take with or immediately following a meal. 180 tablet 3   No current facility-administered medications for this visit.   Allergies:  Levothyroxine, Tylenol [acetaminophen], and Nabumetone   ROS: Reports trouble with anemia being managed by PCP.  Physical Exam: VS:  BP 138/78   Pulse 70   Ht 5' 5"  (1.651 m)   Wt 220 lb (99.8 kg)   SpO2 96%   BMI 36.61 kg/m , BMI Body mass index is 36.61 kg/m.  Wt Readings from Last 3 Encounters:  12/26/19 220 lb (99.8 kg)  10/17/19 215 lb 9.6 oz (97.8 kg)  03/31/19 218 lb 3.2 oz (99 kg)    General: Patient appears comfortable at rest. HEENT: Conjunctiva and lids normal, wearing a mask. Neck: Supple, no elevated JVP or carotid bruits, no thyromegaly. Lungs: Clear to auscultation, nonlabored breathing at rest. Cardiac: Regular rate and rhythm, no S3 or significant systolic murmur. Extremities: No pitting edema, distal pulses 2+.  ECG:  An ECG dated 02/04/2019 was personally reviewed today and demonstrated:  Sinus rhythm with prolonged PR interval and PVC.  Recent Labwork: 10/17/2019: TSH 3.26   Other Studies Reviewed Today:  Echocardiogram performed on October 5 reported mild LVH with  LVEF 60 to 65%, mild left atrial enlargement, mildly calcified mitral leaflets, trace tricuspid regurgitation, no pericardial effusion.  Zio patch with 11-day and 17-hour analysis time showed predominant rhythm being sinus with heart rate ranging from 52 bpm up to 114 bpm and average heart rate 74 bpm. There were rare PACs and brief episodes of SVT, the longest of which lasted 7 beats. There were frequent PVCs noted however representing 20% of total beats with some bigeminy and trigeminy, but no sustained ventricular events.  Lexiscan Myoview 02/09/2019:  There was no ST segment deviation noted during stress.  Normal myocardial perfusion. Extensive soft tissue attenuation noted.  This is an intermediate risk study, primarily based on reduced LVEF. However, LV function appears grossly normal and the reduced LVEF may be due to gating artifact. If echocardiogram is performed and LV systolic function is found to be normal, this would be considered a low risk study.  Nuclear stress EF: 37%.  Assessment and Plan:  1.  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  No syncope.  Plan to increase Toprol-XL 200 mg twice daily.  She has had ischemic and structural cardiac testing as noted above.  ECG reviewed.  2.  Essential hypertension, systolic in the 438O today.  Keep follow-up with Dr. Woody Seller.  Medication Adjustments/Labs and Tests Ordered: Current medicines are reviewed at length with the patient today.  Concerns regarding medicines are outlined above.   Tests Ordered: Orders Placed This Encounter  Procedures  . EKG 12-Lead    Medication Changes: Meds ordered this encounter  Medications  . metoprolol succinate (TOPROL-XL) 100 MG 24 hr tablet    Sig: Take 1 tablet (100 mg total) by mouth 2 (two) times daily. Take with or immediately following a meal.    Dispense:  180 tablet    Refill:  3    12/26/2019 dose increase    Disposition:  Follow up 6 months in the [place]  office.  Signed, Satira Sark, MD, [facility] 12/26/2019 9:53 AM    [facility] [place], [place], [place] [address] Phone: [phone]; Fax: [phone]

## 2019-12-26 ENCOUNTER — Ambulatory Visit (INDEPENDENT_AMBULATORY_CARE_PROVIDER_SITE_OTHER): Payer: Medicare Other | Admitting: Cardiology

## 2019-12-26 ENCOUNTER — Encounter: Payer: Self-pay | Admitting: Cardiology

## 2019-12-26 VITALS — BP 138/78 | HR 70 | Ht 65.0 in | Wt 220.0 lb

## 2019-12-26 DIAGNOSIS — I10 Essential (primary) hypertension: Secondary | ICD-10-CM | POA: Diagnosis not present

## 2019-12-26 DIAGNOSIS — R079 Chest pain, unspecified: Secondary | ICD-10-CM | POA: Diagnosis not present

## 2019-12-26 DIAGNOSIS — I493 Ventricular premature depolarization: Secondary | ICD-10-CM | POA: Diagnosis not present

## 2019-12-26 DIAGNOSIS — I471 Supraventricular tachycardia: Secondary | ICD-10-CM | POA: Diagnosis not present

## 2019-12-26 MED ORDER — METOPROLOL SUCCINATE ER 100 MG PO TB24
100.0000 mg | ORAL_TABLET | Freq: Two times a day (BID) | ORAL | 3 refills | Status: DC
Start: 2019-12-26 — End: 2020-10-01

## 2019-12-26 NOTE — Patient Instructions (Addendum)
Medication Instructions:   Your physician has recommended you make the following change in your medication:   Increase metoprolol succinate to 100 mg by mouth twice daily. You may take (2) of your 50 mg tablets twice daily until finished  Continue other medications the same  Labwork:  None  Testing/Procedures:  None  Follow-Up:  Your physician recommends that you schedule a follow-up appointment in: 6 months.   Any Other Special Instructions Will Be Listed Below (If Applicable).  If you need a refill on your cardiac medications before your next appointment, please call your pharmacy.

## 2020-01-04 DIAGNOSIS — R5383 Other fatigue: Secondary | ICD-10-CM | POA: Diagnosis not present

## 2020-01-04 DIAGNOSIS — Z299 Encounter for prophylactic measures, unspecified: Secondary | ICD-10-CM | POA: Diagnosis not present

## 2020-01-05 DIAGNOSIS — K7460 Unspecified cirrhosis of liver: Secondary | ICD-10-CM | POA: Diagnosis not present

## 2020-01-05 DIAGNOSIS — D696 Thrombocytopenia, unspecified: Secondary | ICD-10-CM | POA: Diagnosis not present

## 2020-01-05 DIAGNOSIS — K760 Fatty (change of) liver, not elsewhere classified: Secondary | ICD-10-CM | POA: Diagnosis not present

## 2020-01-09 ENCOUNTER — Other Ambulatory Visit (HOSPITAL_COMMUNITY): Payer: Self-pay | Admitting: Psychiatry

## 2020-01-12 DIAGNOSIS — I10 Essential (primary) hypertension: Secondary | ICD-10-CM | POA: Diagnosis not present

## 2020-01-12 DIAGNOSIS — Z299 Encounter for prophylactic measures, unspecified: Secondary | ICD-10-CM | POA: Diagnosis not present

## 2020-01-12 DIAGNOSIS — M069 Rheumatoid arthritis, unspecified: Secondary | ICD-10-CM | POA: Diagnosis not present

## 2020-01-12 DIAGNOSIS — K7460 Unspecified cirrhosis of liver: Secondary | ICD-10-CM | POA: Diagnosis not present

## 2020-01-12 DIAGNOSIS — J449 Chronic obstructive pulmonary disease, unspecified: Secondary | ICD-10-CM | POA: Diagnosis not present

## 2020-01-12 DIAGNOSIS — Z1231 Encounter for screening mammogram for malignant neoplasm of breast: Secondary | ICD-10-CM | POA: Diagnosis not present

## 2020-01-13 DIAGNOSIS — I10 Essential (primary) hypertension: Secondary | ICD-10-CM | POA: Diagnosis not present

## 2020-01-13 DIAGNOSIS — J449 Chronic obstructive pulmonary disease, unspecified: Secondary | ICD-10-CM | POA: Diagnosis not present

## 2020-01-13 DIAGNOSIS — M159 Polyosteoarthritis, unspecified: Secondary | ICD-10-CM | POA: Diagnosis not present

## 2020-01-19 ENCOUNTER — Other Ambulatory Visit: Payer: Self-pay

## 2020-01-19 ENCOUNTER — Encounter (INDEPENDENT_AMBULATORY_CARE_PROVIDER_SITE_OTHER): Payer: Self-pay | Admitting: Gastroenterology

## 2020-01-19 ENCOUNTER — Ambulatory Visit (INDEPENDENT_AMBULATORY_CARE_PROVIDER_SITE_OTHER): Payer: Medicare Other | Admitting: Gastroenterology

## 2020-01-19 VITALS — BP 141/83 | HR 70 | Temp 98.8°F | Ht 65.0 in | Wt 220.9 lb

## 2020-01-19 DIAGNOSIS — E669 Obesity, unspecified: Secondary | ICD-10-CM | POA: Insufficient documentation

## 2020-01-19 DIAGNOSIS — K760 Fatty (change of) liver, not elsewhere classified: Secondary | ICD-10-CM

## 2020-01-19 DIAGNOSIS — K581 Irritable bowel syndrome with constipation: Secondary | ICD-10-CM | POA: Diagnosis not present

## 2020-01-19 NOTE — Progress Notes (Signed)
[name], M.D. Gastroenterology & Hepatology [facility] [address] [place],  [address]  Primary Care Physician: Glenda Chroman, MD [place] [id number]  I will communicate my assessment and recommendations to the referring MD via EMR. "Note: Occasional unusual wording and randomly placed punctuation marks may result from the use of speech recognition technology to transcribe this document"  Problems: 1. IBS-C 2. Drug-induced constipation  History of Present Illness: [name] is a 69 y.o. female with past medical history of hepatitis C status post treatment in remission, anxiety, IBS-C, rheumatoid arthritis, COPD, GERD, hypertension, hypothyroidism, ?SVT,  recurrent diverticulitis s/p partial colectomy, who presents for follow up of constipation  The patient was last seen on 10/17/2019. At that time, the patient was started on Linzess 72 mcg every day.  TSH was checked at that time and was within normal limits.  The patient states that she had persistent constipation with Linzess 72 mcg qday, due to this she has tried to take 2 tablets sometimes which has led to improvement in her bowel movements.  Usually she has a BM every day but sometimes has it every 3 days and has to strain significantly to have a bowel movement.  Especially in this days she takes the 2 tablets of Linzess. Her stools are very hard and feels like "a rock". Has bloating frequently and usually when she is more constipated, which gets better with bowel movements.  States she is pursuing RYGB soon as she has not been able to exercise as in the past due to her medical conditions, will see her bariatric surgeon next week as part of her evaluation.  She is scared as she knows she has a fatty liver and would not like to have complications due to this.  The patient denies having any nausea, vomiting, fever, chills, hematochezia, melena, hematemesis,  abdominal distention, abdominal pain, diarrhea, jaundice, pruritus or weight loss.  Past Medical History: Past Medical History:  Diagnosis Date   Anxiety    Arthritis    COPD (chronic obstructive pulmonary disease) ([place])    Depression    Diverticulitis 04/2012   [facility]   Essential hypertension    Frequent PVCs    Outflow tract, positive in the inferior leads   GERD (gastroesophageal reflux disease)    Hepatitis C    Treated   History of pneumonia    Insomnia    Migraine headache    Splenomegaly    Thyroid disease    Wears glasses     Past Surgical History: Past Surgical History:  Procedure Laterality Date   ABDOMINAL HYSTERECTOMY     ANKLE SURGERY Right    COLON SURGERY     "part of color removed"   COLONOSCOPY     ESOPHAGEAL MANOMETRY N/A 04/10/2014   Procedure: ESOPHAGEAL MANOMETRY (EM);  Surgeon: Gatha Mayer, MD;  [facility];  Service: Endoscopy;  Laterality: N/A;   ESOPHAGOGASTRODUODENOSCOPY     INCISIONAL HERNIA REPAIR N/A 10/24/2015   Procedure: LAPAROSCOPIC REPAIR INCISIONAL HERNIA WITH MESH;  Surgeon: Georganna Skeans, MD;  Location: [place];  Service: General;  Laterality: N/A;   INSERTION OF MESH N/A 10/24/2015   Procedure: INSERTION OF MESH;  Surgeon: Georganna Skeans, MD;  Location: [place];  Service: General;  Laterality: N/A;   JOINT REPLACEMENT     KNEE SURGERY Right    x4   [place] IMPEDANCE STUDY N/A 04/10/2014   Procedure: [place] IMPEDANCE  STUDY;  Surgeon: Gatha Mayer, MD;  Location: [facility];  Service: Endoscopy;  Laterality: N/A;   TOTAL SHOULDER ARTHROPLASTY Left 04/22/2018   Procedure: Left total shoulder arthroplasty;  Surgeon: Justice Britain, MD;  [facility];  Service: Orthopedics;  Laterality: Left;  166mn    Family History: Family History  Problem Relation Age of Onset   Alcohol abuse Father    Dementia Father    Dementia Maternal Grandmother    Bipolar disorder Daughter    Anxiety  disorder Daughter    Depression Brother    Drug abuse Brother    Alcohol abuse Paternal Uncle    Depression Paternal Uncle    ADD / ADHD Neg Hx     Social History: Social History   Tobacco Use  Smoking Status Never Smoker  Smokeless Tobacco Never Used   Social History   Substance and Sexual Activity  Alcohol Use Yes   Alcohol/week: 3.0 standard drinks   Types: 3 Cans of beer per week   Comment: occasionally   Social History   Substance and Sexual Activity  Drug Use No    Allergies: Allergies  Allergen Reactions   Levothyroxine Hives and Itching   Tylenol [Acetaminophen] Other (See Comments)    Liver function per MD    Nabumetone Rash    Medications: Current Outpatient Medications  Medication Sig Dispense Refill   albuterol (PROVENTIL HFA;VENTOLIN HFA) 108 (90 Base) MCG/ACT inhaler Inhale 2 puffs into the lungs every 6 (six) hours as needed for wheezing or shortness of breath.     albuterol (PROVENTIL) (2.5 MG/3ML) 0.083% nebulizer solution Take 2.5 mg by nebulization every 6 (six) hours as needed for wheezing or shortness of breath.     ALPRAZolam (XANAX) 1 MG tablet Take 1 tablet (1 mg total) by mouth 4 (four) times daily. 120 tablet 2   aspirin EC 81 MG tablet Take 81 mg by mouth daily.     Cyanocobalamin (VITAMIN B-12 IJ) Inject as directed every 30 (thirty) days.     cyclobenzaprine (FLEXERIL) 10 MG tablet Take 10 mg by mouth every 8 (eight) hours as needed.     ergocalciferol (VITAMIN D2) 1.25 MG (50000 UT) capsule Take 50,000 Units by mouth 2 (two) times a week.     FLUoxetine (PROZAC) 40 MG capsule TAKE ONE CAPSULE BY MOUTH ONCE DAILY 30 capsule 2   Fluticasone-Umeclidin-Vilant (TRELEGY ELLIPTA IN) Inhale into the lungs as needed.      gabapentin (NEURONTIN) 100 MG capsule Take 300 mg by mouth at bedtime.      linaclotide (LINZESS) 72 MCG capsule Take 72 mcg by mouth daily before breakfast. 1-2 daily     meloxicam (MOBIC) 15 MG tablet  Take 1 tablet (15 mg total) by mouth daily. 30 tablet 1   metoprolol succinate (TOPROL-XL) 100 MG 24 hr tablet Take 1 tablet (100 mg total) by mouth 2 (two) times daily. Take with or immediately following a meal. 180 tablet 3   Multiple Vitamin (MULTI-VITAMIN PO) Take 1 tablet by mouth daily.     nystatin (MYCOSTATIN) powder Apply 1 g topically as needed (under breast).   2   polyethylene glycol powder (MIRALAX) powder Take 17 g by mouth daily. (Patient taking differently: Take 17 g by mouth daily as needed for moderate constipation. ) 255 g 0   Simethicone (GAS RELIEF PO) Take 2 capsules by mouth as needed (flatulence).      temazepam (RESTORIL) 30 MG capsule Take 1 capsule (30 mg total)  by mouth at bedtime as needed. for sleep 30 capsule 3   traMADol (ULTRAM) 50 MG tablet Take 1 tablet (50 mg total) by mouth every 6 (six) hours as needed. 40 tablet 2   Docusate Calcium (STOOL SOFTENER PO) Take 2 capsules by mouth daily.  (Patient not taking: Reported on 01/19/2020)     linaclotide (LINZESS) 72 MCG capsule Take 1 capsule (72 mcg total) by mouth daily. 30 capsule 3   No current facility-administered medications for this visit.    Review of Systems: GENERAL: negative for malaise, night sweats HEENT: No changes in hearing or vision, no nose bleeds or other nasal problems. NECK: Negative for lumps, goiter, pain and significant neck swelling RESPIRATORY: Negative for cough, wheezing CARDIOVASCULAR: Negative for chest pain, leg swelling, palpitations, orthopnea GI: SEE HPI MUSCULOSKELETAL: Negative for joint pain or swelling, back pain, and muscle pain. SKIN: Negative for lesions, rash PSYCH: Negative for sleep disturbance, mood disorder and recent psychosocial stressors. HEMATOLOGY Negative for prolonged bleeding, bruising easily, and swollen nodes. ENDOCRINE: Negative for cold or heat intolerance, polyuria, polydipsia and goiter. NEURO: negative for tremor, gait imbalance, syncope and  seizures. The remainder of the review of systems is noncontributory.   Physical Exam: BP (!) 141/83 (BP Location: Right Arm, Patient Position: Sitting, Cuff Size: Normal)    Pulse 70    Temp 98.8 F (37.1 C) (Oral)    Ht 5' 5"  (1.651 m)    Wt 220 lb 14.4 oz (100.2 kg)    BMI 36.76 kg/m  GENERAL: The patient is AO x3, in no acute distress. Obese. HEENT: Head is normocephalic and atraumatic. EOMI are intact. Mouth is well hydrated and without lesions. NECK: Supple. No masses LUNGS: Clear to auscultation. No presence of rhonchi/wheezing/rales. Adequate chest expansion HEART: RRR, normal s1 and s2. ABDOMEN: Soft, nontender, no guarding, no peritoneal signs, and nondistended. BS +. No masses. EXTREMITIES: Without any cyanosis, clubbing, rash, lesions or edema. NEUROLOGIC: AOx3, no focal motor deficit. SKIN: no jaundice, no rashes  Imaging/Labs: as above  I personally reviewed and interpreted the available labs, imaging and endoscopic files.  Impression and Plan: [name] is a 69 y.o. female with past medical history of hepatitis C status post treatment in remission, anxiety, IBS-C, rheumatoid arthritis, COPD, GERD, hypertension, hypothyroidism, ?SVT,  recurrent diverticulitis s/p partial colectomy, who presents for follow up of constipation.  The patient had very mild improvement while taking Linzess 72 mcg every day but she has presented significant improvement while taking 2 tablets as needed.  I had advised her to increase her dose of Linzess to 2 tablets every day and when she needs a refill of her medication will prescribe 145 mcg of the medication on a daily basis.  Given her history of fatty liver, I would like to check her CMP at this time, but I congratulated the patient on pursuing weight loss surgically which I think is ideal given her BMI.  I advised her about the importance for her liver disease but also for other systems, and weight loss is associated with multiple  comorbidities.  - Increase Linzess to 145 mcg qday - Follow up with bariatric surgery regarding RYGB - Repeat colonoscopy in 2023 - Check CMP - RTC 6 months  All questions were answered.      Harvel Quale, MD Gastroenterology and Hepatology [facility]

## 2020-01-19 NOTE — Patient Instructions (Addendum)
Increase Linzess to 145 mcg qday (take 2 tablets daily) Follow up with bariatric surgery regarding your procedure Repeat colonoscopy in 2023 Perform blood workup

## 2020-01-20 LAB — COMPREHENSIVE METABOLIC PANEL
AG Ratio: 1.8 (calc) (ref 1.0–2.5)
ALT: 28 U/L (ref 6–29)
AST: 31 U/L (ref 10–35)
Albumin: 4.3 g/dL (ref 3.6–5.1)
Alkaline phosphatase (APISO): 80 U/L (ref 37–153)
BUN: 17 mg/dL (ref 7–25)
CO2: 25 mmol/L (ref 20–32)
Calcium: 10 mg/dL (ref 8.6–10.4)
Chloride: 104 mmol/L (ref 98–110)
Creat: 0.75 mg/dL (ref 0.50–0.99)
Globulin: 2.4 g/dL (calc) (ref 1.9–3.7)
Glucose, Bld: 113 mg/dL (ref 65–139)
Potassium: 4.1 mmol/L (ref 3.5–5.3)
Sodium: 139 mmol/L (ref 135–146)
Total Bilirubin: 0.6 mg/dL (ref 0.2–1.2)
Total Protein: 6.7 g/dL (ref 6.1–8.1)

## 2020-01-27 DIAGNOSIS — E039 Hypothyroidism, unspecified: Secondary | ICD-10-CM | POA: Diagnosis not present

## 2020-01-27 DIAGNOSIS — Z299 Encounter for prophylactic measures, unspecified: Secondary | ICD-10-CM | POA: Diagnosis not present

## 2020-01-27 DIAGNOSIS — J449 Chronic obstructive pulmonary disease, unspecified: Secondary | ICD-10-CM | POA: Diagnosis not present

## 2020-01-27 DIAGNOSIS — I10 Essential (primary) hypertension: Secondary | ICD-10-CM | POA: Diagnosis not present

## 2020-01-27 DIAGNOSIS — E538 Deficiency of other specified B group vitamins: Secondary | ICD-10-CM | POA: Diagnosis not present

## 2020-01-31 ENCOUNTER — Other Ambulatory Visit: Payer: Self-pay

## 2020-01-31 ENCOUNTER — Telehealth (INDEPENDENT_AMBULATORY_CARE_PROVIDER_SITE_OTHER): Payer: Medicare Other | Admitting: Psychiatry

## 2020-01-31 ENCOUNTER — Encounter (HOSPITAL_COMMUNITY): Payer: Self-pay | Admitting: Psychiatry

## 2020-01-31 DIAGNOSIS — F331 Major depressive disorder, recurrent, moderate: Secondary | ICD-10-CM

## 2020-01-31 MED ORDER — TEMAZEPAM 30 MG PO CAPS
30.0000 mg | ORAL_CAPSULE | Freq: Every evening | ORAL | 3 refills | Status: DC | PRN
Start: 2020-01-31 — End: 2020-05-03

## 2020-01-31 MED ORDER — ALPRAZOLAM 1 MG PO TABS: 1.0000 mg | ORAL_TABLET | Freq: Four times a day (QID) | ORAL | 2 refills | Status: DC

## 2020-01-31 MED ORDER — FLUOXETINE HCL 40 MG PO CAPS: 40.0000 mg | ORAL_CAPSULE | Freq: Every day | ORAL | 2 refills | Status: DC

## 2020-01-31 NOTE — Progress Notes (Signed)
Virtual Visit via Telephone Note  I connected with [name] on 01/31/20 at 11:00 AM EST by telephone and verified that I am speaking with the correct person using two identifiers.  Location: Patient: home Provider: office   I discussed the limitations, risks, security and privacy concerns of performing an evaluation and management service by telephone and the availability of in person appointments. I also discussed with the patient that there may be a patient responsible charge related to this service. The patient expressed understanding and agreed to proceed.    I discussed the assessment and treatment plan with the patient. The patient was provided an opportunity to ask questions and all were answered. The patient agreed with the plan and demonstrated an understanding of the instructions.   The patient was advised to call back or seek an in-person evaluation if the symptoms worsen or if the condition fails to improve as anticipated.  I provided 15 minutes of non-face-to-face time during this encounter.   [name], MD  [facility] MD/PA/NP OP Progress Note  01/31/2020 11:11 AM [name]  MRN:  [id number]  Chief Complaint:  Chief Complaint    Depression; Anxiety; Follow-up     HPI: This patient is a 69 year old widowed white female lives alone in [place].  Her daughter and brothers live nearby.  She has 1 son who recently moved back from [place].  She is on disability.  The patient returns after 3 months for follow-up regarding her depression and anxiety.  She states that she was very worried that she might have stomach cancer because her stomach got so bloated.  Her primary doctor did an ultrasound and she was found to have a fatty liver.  He really wants her to work on weight loss and she is already tried diet pills and dieting but nothing is working.  She has now been referred for possible bariatric surgery.  This is giving her a lot of help and she is excited about it.  For  now she states that her mood is stable and she denies serious depression or suicidal ideation.  Her anxiety is well controlled with the Xanax and she is sleeping well with the temazepam. Visit Diagnosis:    ICD-10-CM   1. Major depressive disorder, recurrent episode, moderate (HCC)  F33.1     Past Psychiatric History: none  Past Medical History:  Past Medical History:  Diagnosis Date  . Anxiety   . Arthritis   . COPD (chronic obstructive pulmonary disease) ([place])   . Depression   . Diverticulitis 04/2012   [facility]  . Essential hypertension   . Frequent PVCs    Outflow tract, positive in the inferior leads  . GERD (gastroesophageal reflux disease)   . Hepatitis C    Treated  . History of pneumonia   . Insomnia   . Migraine headache   . Splenomegaly   . Thyroid disease   . Wears glasses     Past Surgical History:  Procedure Laterality Date  . ABDOMINAL HYSTERECTOMY    . ANKLE SURGERY Right   . COLON SURGERY     "part of color removed"  . COLONOSCOPY    . ESOPHAGEAL MANOMETRY N/A 04/10/2014   Procedure: ESOPHAGEAL MANOMETRY (EM);  Surgeon: Gatha Mayer, MD;  [facility];  Service: Endoscopy;  Laterality: N/A;  . ESOPHAGOGASTRODUODENOSCOPY    . INCISIONAL HERNIA REPAIR N/A 10/24/2015   Procedure: LAPAROSCOPIC REPAIR INCISIONAL HERNIA WITH MESH;  Surgeon: Georganna Skeans, MD;  Location: [place] [facility];  Service: General;  Laterality: N/A;  . INSERTION OF MESH N/A 10/24/2015   Procedure: INSERTION OF MESH;  Surgeon: Georganna Skeans, MD;  Location: [place];  Service: General;  Laterality: N/A;  . JOINT REPLACEMENT    . KNEE SURGERY Right    x4  . [place] IMPEDANCE STUDY N/A 04/10/2014   Procedure: [place] IMPEDANCE STUDY;  Surgeon: Gatha Mayer, MD;  [facility];  Service: Endoscopy;  Laterality: N/A;  . TOTAL SHOULDER ARTHROPLASTY Left 04/22/2018   Procedure: Left total shoulder arthroplasty;  Surgeon: Justice Britain, MD;  [facility];  Service: Orthopedics;   Laterality: Left;  153mn    Family Psychiatric History: see below  Family History:  Family History  Problem Relation Age of Onset  . Alcohol abuse Father   . Dementia Father   . Dementia Maternal Grandmother   . Bipolar disorder Daughter   . Anxiety disorder Daughter   . Depression Brother   . Drug abuse Brother   . Alcohol abuse Paternal Uncle   . Depression Paternal Uncle   . ADD / ADHD Neg Hx     Social History:  Social History   Socioeconomic History  . Marital status: Widowed    Spouse name: Not on file  . Number of children: 2  . Years of education: 12th grade  . Highest education level: Not on file  Occupational History  . Occupation: RETIRED  Tobacco Use  . Smoking status: Never Smoker  . Smokeless tobacco: Never Used  Vaping Use  . Vaping Use: Never used  Substance and Sexual Activity  . Alcohol use: Yes    Alcohol/week: 3.0 standard drinks    Types: 3 Cans of beer per week    Comment: occasionally  . Drug use: No  . Sexual activity: Never  Other Topics Concern  . Not on file  Social History Narrative  . Not on file   Social Determinants of Health   Financial Resource Strain:   . Difficulty of Paying Living Expenses: Not on file  Food Insecurity:   . Worried About RCharity fundraiserin the Last Year: Not on file  . Ran Out of Food in the Last Year: Not on file  Transportation Needs:   . Lack of Transportation (Medical): Not on file  . Lack of Transportation (Non-Medical): Not on file  Physical Activity:   . Days of Exercise per Week: Not on file  . Minutes of Exercise per Session: Not on file  Stress:   . Feeling of Stress : Not on file  Social Connections:   . Frequency of Communication with Friends and Family: Not on file  . Frequency of Social Gatherings with Friends and Family: Not on file  . Attends Religious Services: Not on file  . Active Member of Clubs or Organizations: Not on file  . Attends CArchivistMeetings: Not on  file  . Marital Status: Not on file    Allergies:  Allergies  Allergen Reactions  . Levothyroxine Hives and Itching  . Tylenol [Acetaminophen] Other (See Comments)    Liver function per MD   . Nabumetone Rash    Metabolic Disorder Labs: No results found for: HGBA1C, MPG No results found for: PROLACTIN No results found for: CHOL, TRIG, HDL, CHOLHDL, VLDL, LDLCALC Lab Results  Component Value Date   TSH 3.26 10/17/2019   TSH 4.540 (H) 10/28/2007    Therapeutic Level Labs: No results found for: LITHIUM No results  found for: VALPROATE No components found for:  CBMZ  Current Medications: Current Outpatient Medications  Medication Sig Dispense Refill  . albuterol (PROVENTIL HFA;VENTOLIN HFA) 108 (90 Base) MCG/ACT inhaler Inhale 2 puffs into the lungs every 6 (six) hours as needed for wheezing or shortness of breath.    Marland Kitchen albuterol (PROVENTIL) (2.5 MG/3ML) 0.083% nebulizer solution Take 2.5 mg by nebulization every 6 (six) hours as needed for wheezing or shortness of breath.    . ALPRAZolam (XANAX) 1 MG tablet Take 1 tablet (1 mg total) by mouth 4 (four) times daily. 120 tablet 2  . aspirin EC 81 MG tablet Take 81 mg by mouth daily.    . Cyanocobalamin (VITAMIN B-12 IJ) Inject as directed every 30 (thirty) days.    . cyclobenzaprine (FLEXERIL) 10 MG tablet Take 10 mg by mouth every 8 (eight) hours as needed.    [name] Calcium (STOOL SOFTENER PO) Take 2 capsules by mouth daily.  (Patient not taking: Reported on 01/19/2020)    . ergocalciferol (VITAMIN D2) 1.25 MG (50000 UT) capsule Take 50,000 Units by mouth 2 (two) times a week.    Marland Kitchen FLUoxetine (PROZAC) 40 MG capsule Take 1 capsule (40 mg total) by mouth daily. 30 capsule 2  . Fluticasone-Umeclidin-Vilant (TRELEGY ELLIPTA IN) Inhale into the lungs as needed.     . gabapentin (NEURONTIN) 100 MG capsule Take 300 mg by mouth at bedtime.     Marland Kitchen linaclotide (LINZESS) 72 MCG capsule Take 1 capsule (72 mcg total) by mouth daily. 30  capsule 3  . linaclotide (LINZESS) 72 MCG capsule Take 72 mcg by mouth daily before breakfast. 1-2 daily    . meloxicam (MOBIC) 15 MG tablet Take 1 tablet (15 mg total) by mouth daily. 30 tablet 1  . metoprolol succinate (TOPROL-XL) 100 MG 24 hr tablet Take 1 tablet (100 mg total) by mouth 2 (two) times daily. Take with or immediately following a meal. 180 tablet 3  . Multiple Vitamin (MULTI-VITAMIN PO) Take 1 tablet by mouth daily.    Marland Kitchen nystatin (MYCOSTATIN) powder Apply 1 g topically as needed (under breast).   2  . polyethylene glycol powder (MIRALAX) powder Take 17 g by mouth daily. (Patient taking differently: Take 17 g by mouth daily as needed for moderate constipation. ) 255 g 0  . Simethicone (GAS RELIEF PO) Take 2 capsules by mouth as needed (flatulence).     . temazepam (RESTORIL) 30 MG capsule Take 1 capsule (30 mg total) by mouth at bedtime as needed. for sleep 30 capsule 3  . traMADol (ULTRAM) 50 MG tablet Take 1 tablet (50 mg total) by mouth every 6 (six) hours as needed. 40 tablet 2   No current facility-administered medications for this visit.     Musculoskeletal: Strength & Muscle Tone: within normal limits Gait & Station: normal Patient leans: N/A  Psychiatric Specialty Exam: Review of Systems  Musculoskeletal: Positive for arthralgias, back pain and joint swelling.  All other systems reviewed and are negative.   There were no vitals taken for this visit.There is no height or weight on file to calculate BMI.  General Appearance: NA  Eye Contact:  NA  Speech:  Clear and Coherent  Volume:  Normal  Mood:  Euthymic  Affect:  NA  Thought Process:  Goal Directed  Orientation:  Full (Time, Place, and Person)  Thought Content: WDL   Suicidal Thoughts:  No  Homicidal Thoughts:  No  Memory:  Immediate;   Good Recent;  Good Remote;   Good  Judgement:  Good  Insight:  Good  Psychomotor Activity:  Decreased  Concentration:  Concentration: Good and Attention Span: Good   Recall:  Good  Fund of Knowledge: Good  Language: Good  Akathisia:  No  Handed:  Right  AIMS (if indicated): not done  Assets:  Communication Skills Desire for Improvement Resilience Social Support Talents/Skills  ADL's:  Intact  Cognition: WNL  Sleep:  Good   Screenings:   Assessment and Plan: This patient is a 69 year old female with a history of depression and anxiety.  She currently seems to be doing well on regards of these diagnoses.  She will continue Prozac 40 mg daily for depression, Xanax 1 mg 4 times daily for anxiety and Restoril 30 mg as needed for sleep.  She will return to see me in 3 months   [name], MD 01/31/2020, 11:11 AM

## 2020-02-13 ENCOUNTER — Other Ambulatory Visit (INDEPENDENT_AMBULATORY_CARE_PROVIDER_SITE_OTHER): Payer: Self-pay

## 2020-02-13 ENCOUNTER — Other Ambulatory Visit (INDEPENDENT_AMBULATORY_CARE_PROVIDER_SITE_OTHER): Payer: Self-pay | Admitting: Gastroenterology

## 2020-02-13 DIAGNOSIS — K581 Irritable bowel syndrome with constipation: Secondary | ICD-10-CM

## 2020-02-13 MED ORDER — LINACLOTIDE 145 MCG PO CAPS: 145.0000 ug | ORAL_CAPSULE | Freq: Every day | ORAL | 1 refills | Status: DC

## 2020-02-14 DIAGNOSIS — K7460 Unspecified cirrhosis of liver: Secondary | ICD-10-CM | POA: Diagnosis not present

## 2020-02-14 DIAGNOSIS — J449 Chronic obstructive pulmonary disease, unspecified: Secondary | ICD-10-CM | POA: Diagnosis not present

## 2020-02-14 DIAGNOSIS — M159 Polyosteoarthritis, unspecified: Secondary | ICD-10-CM | POA: Diagnosis not present

## 2020-02-14 DIAGNOSIS — I10 Essential (primary) hypertension: Secondary | ICD-10-CM | POA: Diagnosis not present

## 2020-02-14 DIAGNOSIS — E039 Hypothyroidism, unspecified: Secondary | ICD-10-CM | POA: Diagnosis not present

## 2020-02-14 DIAGNOSIS — Z299 Encounter for prophylactic measures, unspecified: Secondary | ICD-10-CM | POA: Diagnosis not present

## 2020-03-15 DIAGNOSIS — I10 Essential (primary) hypertension: Secondary | ICD-10-CM | POA: Diagnosis not present

## 2020-03-15 DIAGNOSIS — J449 Chronic obstructive pulmonary disease, unspecified: Secondary | ICD-10-CM | POA: Diagnosis not present

## 2020-03-15 DIAGNOSIS — M159 Polyosteoarthritis, unspecified: Secondary | ICD-10-CM | POA: Diagnosis not present

## 2020-04-17 ENCOUNTER — Other Ambulatory Visit (HOSPITAL_COMMUNITY): Payer: Self-pay | Admitting: Psychiatry

## 2020-04-24 DIAGNOSIS — I10 Essential (primary) hypertension: Secondary | ICD-10-CM | POA: Diagnosis not present

## 2020-04-24 DIAGNOSIS — I471 Supraventricular tachycardia: Secondary | ICD-10-CM | POA: Diagnosis not present

## 2020-04-24 DIAGNOSIS — J449 Chronic obstructive pulmonary disease, unspecified: Secondary | ICD-10-CM | POA: Diagnosis not present

## 2020-04-24 DIAGNOSIS — Z299 Encounter for prophylactic measures, unspecified: Secondary | ICD-10-CM | POA: Diagnosis not present

## 2020-04-24 DIAGNOSIS — M1990 Unspecified osteoarthritis, unspecified site: Secondary | ICD-10-CM | POA: Diagnosis not present

## 2020-04-26 DIAGNOSIS — K219 Gastro-esophageal reflux disease without esophagitis: Secondary | ICD-10-CM | POA: Diagnosis not present

## 2020-04-26 DIAGNOSIS — I10 Essential (primary) hypertension: Secondary | ICD-10-CM | POA: Diagnosis not present

## 2020-04-26 DIAGNOSIS — G4730 Sleep apnea, unspecified: Secondary | ICD-10-CM | POA: Diagnosis not present

## 2020-04-26 DIAGNOSIS — Z713 Dietary counseling and surveillance: Secondary | ICD-10-CM | POA: Diagnosis not present

## 2020-04-26 DIAGNOSIS — Z1321 Encounter for screening for nutritional disorder: Secondary | ICD-10-CM | POA: Diagnosis not present

## 2020-05-01 ENCOUNTER — Other Ambulatory Visit: Payer: Self-pay | Admitting: Surgery

## 2020-05-01 ENCOUNTER — Other Ambulatory Visit (HOSPITAL_COMMUNITY): Payer: Self-pay | Admitting: Surgery

## 2020-05-02 ENCOUNTER — Other Ambulatory Visit (HOSPITAL_COMMUNITY): Payer: Self-pay | Admitting: Surgery

## 2020-05-02 ENCOUNTER — Other Ambulatory Visit: Payer: Self-pay

## 2020-05-02 ENCOUNTER — Telehealth (HOSPITAL_COMMUNITY): Payer: Medicare Other | Admitting: Psychiatry

## 2020-05-02 DIAGNOSIS — I10 Essential (primary) hypertension: Secondary | ICD-10-CM

## 2020-05-03 ENCOUNTER — Other Ambulatory Visit: Payer: Self-pay

## 2020-05-03 ENCOUNTER — Telehealth (INDEPENDENT_AMBULATORY_CARE_PROVIDER_SITE_OTHER): Payer: Medicare Other | Admitting: Psychiatry

## 2020-05-03 ENCOUNTER — Encounter (HOSPITAL_COMMUNITY): Payer: Self-pay | Admitting: Psychiatry

## 2020-05-03 DIAGNOSIS — F331 Major depressive disorder, recurrent, moderate: Secondary | ICD-10-CM | POA: Diagnosis not present

## 2020-05-03 MED ORDER — TEMAZEPAM 30 MG PO CAPS
30.0000 mg | ORAL_CAPSULE | Freq: Every evening | ORAL | 3 refills | Status: DC | PRN
Start: 2020-05-03 — End: 2020-08-27

## 2020-05-03 MED ORDER — ALPRAZOLAM 1 MG PO TABS
1.0000 mg | ORAL_TABLET | Freq: Four times a day (QID) | ORAL | 3 refills | Status: DC
Start: 2020-05-03 — End: 2020-08-15

## 2020-05-03 MED ORDER — FLUOXETINE HCL 40 MG PO CAPS
40.0000 mg | ORAL_CAPSULE | Freq: Every day | ORAL | 3 refills | Status: DC
Start: 2020-05-03 — End: 2020-08-27

## 2020-05-03 NOTE — Progress Notes (Signed)
Virtual Visit via Telephone Note  I connected with [name] on 05/03/20 at  1:20 PM EST by telephone and verified that I am speaking with the correct person using two identifiers.  Location: Patient: home Provider: office   I discussed the limitations, risks, security and privacy concerns of performing an evaluation and management service by telephone and the availability of in person appointments. I also discussed with the patient that there may be a patient responsible charge related to this service. The patient expressed understanding and agreed to proceed.       I discussed the assessment and treatment plan with the patient. The patient was provided an opportunity to ask questions and all were answered. The patient agreed with the plan and demonstrated an understanding of the instructions.   The patient was advised to call back or seek an in-person evaluation if the symptoms worsen or if the condition fails to improve as anticipated.  I provided 15 minutes of non-face-to-face time during this encounter.   [name], MD  [facility] MD/PA/NP OP Progress Note  05/03/2020 1:36 PM [name]  MRN:  [id number]  Chief Complaint:  Chief Complaint    Anxiety; Depression; Follow-up     HPI: This patient is a 70 year old widowed white female lives alone in [place].  Her daughter and brothers live nearby.  She has 1 son who recently moved back from [place].  She is on disability.  The patient returns for follow-up after 3 months.  She states overall she is doing okay.  She is started the process to get evaluated for bariatric surgery.  She has tried numerous diets and has had no luck losing weight.  She is fairly excited about it.  In general her mood has been good.  She denies depression severe anxiety or suicidal ideation.  She is sleeping well. Visit Diagnosis:    ICD-10-CM   1. Major depressive disorder, recurrent episode, moderate (HCC)  F33.1     Past Psychiatric History:  none  Past Medical History:  Past Medical History:  Diagnosis Date  . Anxiety   . Arthritis   . COPD (chronic obstructive pulmonary disease) ([place])   . Depression   . Diverticulitis 04/2012   [facility]  . Essential hypertension   . Frequent PVCs    Outflow tract, positive in the inferior leads  . GERD (gastroesophageal reflux disease)   . Hepatitis C    Treated  . History of pneumonia   . Insomnia   . Migraine headache   . Splenomegaly   . Thyroid disease   . Wears glasses     Past Surgical History:  Procedure Laterality Date  . ABDOMINAL HYSTERECTOMY    . ANKLE SURGERY Right   . COLON SURGERY     "part of color removed"  . COLONOSCOPY    . ESOPHAGEAL MANOMETRY N/A 04/10/2014   Procedure: ESOPHAGEAL MANOMETRY (EM);  Surgeon: Gatha Mayer, MD;  [facility];  Service: Endoscopy;  Laterality: N/A;  . ESOPHAGOGASTRODUODENOSCOPY    . INCISIONAL HERNIA REPAIR N/A 10/24/2015   Procedure: LAPAROSCOPIC REPAIR INCISIONAL HERNIA WITH MESH;  Surgeon: Georganna Skeans, MD;  Location: [place];  Service: General;  Laterality: N/A;  . INSERTION OF MESH N/A 10/24/2015   Procedure: INSERTION OF MESH;  Surgeon: Georganna Skeans, MD;  Location: [place];  Service: General;  Laterality: N/A;  . JOINT REPLACEMENT    . KNEE SURGERY Right    x4  . [place] IMPEDANCE STUDY  N/A 04/10/2014   Procedure: [place] IMPEDANCE STUDY;  Surgeon: Gatha Mayer, MD;  [facility];  Service: Endoscopy;  Laterality: N/A;  . TOTAL SHOULDER ARTHROPLASTY Left 04/22/2018   Procedure: Left total shoulder arthroplasty;  Surgeon: Justice Britain, MD;  [facility];  Service: Orthopedics;  Laterality: Left;  174mn    Family Psychiatric History: see below  Family History:  Family History  Problem Relation Age of Onset  . Alcohol abuse Father   . Dementia Father   . Dementia Maternal Grandmother   . Bipolar disorder Daughter   . Anxiety disorder Daughter   . Depression Brother   . Drug abuse  Brother   . Alcohol abuse Paternal Uncle   . Depression Paternal Uncle   . ADD / ADHD Neg Hx     Social History:  Social History   Socioeconomic History  . Marital status: Widowed    Spouse name: Not on file  . Number of children: 2  . Years of education: 12th grade  . Highest education level: Not on file  Occupational History  . Occupation: RETIRED  Tobacco Use  . Smoking status: Never Smoker  . Smokeless tobacco: Never Used  Vaping Use  . Vaping Use: Never used  Substance and Sexual Activity  . Alcohol use: Yes    Alcohol/week: 3.0 standard drinks    Types: 3 Cans of beer per week    Comment: occasionally  . Drug use: No  . Sexual activity: Never  Other Topics Concern  . Not on file  Social History Narrative  . Not on file   Social Determinants of Health   Financial Resource Strain: Not on file  Food Insecurity: Not on file  Transportation Needs: Not on file  Physical Activity: Not on file  Stress: Not on file  Social Connections: Not on file    Allergies:  Allergies  Allergen Reactions  . Levothyroxine Hives and Itching  . Tylenol [Acetaminophen] Other (See Comments)    Liver function per MD   . Nabumetone Rash    Metabolic Disorder Labs: No results found for: HGBA1C, MPG No results found for: PROLACTIN No results found for: CHOL, TRIG, HDL, CHOLHDL, VLDL, LDLCALC Lab Results  Component Value Date   TSH 3.26 10/17/2019   TSH 4.540 (H) 10/28/2007    Therapeutic Level Labs: No results found for: LITHIUM No results found for: VALPROATE No components found for:  CBMZ  Current Medications: Current Outpatient Medications  Medication Sig Dispense Refill  . albuterol (PROVENTIL HFA;VENTOLIN HFA) 108 (90 Base) MCG/ACT inhaler Inhale 2 puffs into the lungs every 6 (six) hours as needed for wheezing or shortness of breath.    .Marland Kitchenalbuterol (PROVENTIL) (2.5 MG/3ML) 0.083% nebulizer solution Take 2.5 mg by nebulization every 6 (six) hours as needed for  wheezing or shortness of breath.    . ALPRAZolam (XANAX) 1 MG tablet Take 1 tablet (1 mg total) by mouth 4 (four) times daily. 120 tablet 3  . aspirin EC 81 MG tablet Take 81 mg by mouth daily.    . Cyanocobalamin (VITAMIN B-12 IJ) Inject as directed every 30 (thirty) days.    . cyclobenzaprine (FLEXERIL) 10 MG tablet Take 10 mg by mouth every 8 (eight) hours as needed.    .Mariane BaumgartenCalcium (STOOL SOFTENER PO) Take 2 capsules by mouth daily.  (Patient not taking: Reported on 01/19/2020)    . ergocalciferol (VITAMIN D2) 1.25 MG (50000 UT) capsule Take 50,000 Units by mouth 2 (two) times  a week.    Marland Kitchen FLUoxetine (PROZAC) 40 MG capsule Take 1 capsule (40 mg total) by mouth daily. 30 capsule 3  . Fluticasone-Umeclidin-Vilant (TRELEGY ELLIPTA IN) Inhale into the lungs as needed.     . gabapentin (NEURONTIN) 100 MG capsule Take 300 mg by mouth at bedtime.     Marland Kitchen linaclotide (LINZESS) 145 MCG CAPS capsule Take 1 capsule (145 mcg total) by mouth daily. 90 capsule 1  . meloxicam (MOBIC) 15 MG tablet Take 1 tablet (15 mg total) by mouth daily. 30 tablet 1  . metoprolol succinate (TOPROL-XL) 100 MG 24 hr tablet Take 1 tablet (100 mg total) by mouth 2 (two) times daily. Take with or immediately following a meal. 180 tablet 3  . Multiple Vitamin (MULTI-VITAMIN PO) Take 1 tablet by mouth daily.    Marland Kitchen nystatin (MYCOSTATIN) powder Apply 1 g topically as needed (under breast).   2  . polyethylene glycol powder (MIRALAX) powder Take 17 g by mouth daily. (Patient taking differently: Take 17 g by mouth daily as needed for moderate constipation. ) 255 g 0  . Simethicone (GAS RELIEF PO) Take 2 capsules by mouth as needed (flatulence).     . temazepam (RESTORIL) 30 MG capsule Take 1 capsule (30 mg total) by mouth at bedtime as needed. for sleep 30 capsule 3  . traMADol (ULTRAM) 50 MG tablet Take 1 tablet (50 mg total) by mouth every 6 (six) hours as needed. 40 tablet 2   No current facility-administered medications for  this visit.     Musculoskeletal: Strength & Muscle Tone: within normal limits Gait & Station: normal Patient leans: N/A  Psychiatric Specialty Exam: Review of Systems  All other systems reviewed and are negative.   There were no vitals taken for this visit.There is no height or weight on file to calculate BMI.  General Appearance: NA  Eye Contact:  NA  Speech:  Clear and Coherent  Volume:  Normal  Mood:  Euthymic  Affect:  NA  Thought Process:  Goal Directed  Orientation:  Full (Time, Place, and Person)  Thought Content: Rumination   Suicidal Thoughts:  No  Homicidal Thoughts:  No  Memory:  Immediate;   Good Recent;   Good Remote;   Fair  Judgement:  Good  Insight:  Fair  Psychomotor Activity:  Decreased  Concentration:  Concentration: Good and Attention Span: Good  Recall:  Good  Fund of Knowledge: Good  Language: Good  Akathisia:  No  Handed:  Right  AIMS (if indicated): not done  Assets:  Communication Skills Desire for Improvement Resilience Social Support Talents/Skills  ADL's:  Intact  Cognition: WNL  Sleep:  Good   Screenings:   Assessment and Plan: This patient is a 70 year old female with a history of depression and anxiety.  She seems to be doing well on her current regimen.  She will continue Prozac 40 mg daily for depression, Xanax 1 mg 4 times daily for anxiety and Restoril 30 mg at bedtime as needed for sleep.  She will return to see me in 49-month  DLevonne Spiller MD 05/03/2020, 1:36 PM

## 2020-05-09 ENCOUNTER — Ambulatory Visit (HOSPITAL_COMMUNITY)
Admission: RE | Admit: 2020-05-09 | Discharge: 2020-05-09 | Disposition: A | Discharge status: Home / Self Care | Payer: Medicare Other | Source: Ambulatory Visit | Attending: Surgery | Admitting: Surgery

## 2020-05-09 ENCOUNTER — Other Ambulatory Visit: Payer: Self-pay

## 2020-05-09 ENCOUNTER — Ambulatory Visit (INDEPENDENT_AMBULATORY_CARE_PROVIDER_SITE_OTHER): Payer: Medicare Other | Admitting: Cardiology

## 2020-05-09 ENCOUNTER — Encounter: Payer: Self-pay | Admitting: Cardiology

## 2020-05-09 VITALS — BP 142/96 | HR 78 | Ht 63.0 in | Wt 212.4 lb

## 2020-05-09 DIAGNOSIS — I493 Ventricular premature depolarization: Secondary | ICD-10-CM | POA: Insufficient documentation

## 2020-05-09 DIAGNOSIS — I10 Essential (primary) hypertension: Secondary | ICD-10-CM | POA: Insufficient documentation

## 2020-05-09 DIAGNOSIS — Z01810 Encounter for preprocedural cardiovascular examination: Secondary | ICD-10-CM | POA: Insufficient documentation

## 2020-05-09 DIAGNOSIS — R9431 Abnormal electrocardiogram [ECG] [EKG]: Secondary | ICD-10-CM | POA: Insufficient documentation

## 2020-05-09 NOTE — Progress Notes (Signed)
Cardiology Office Note  Date: 05/09/2020   ID: [name], DOB November 20, 1950, MRN [id number]  PCP:  Monique Chroman, MD  Cardiologist:  Monique Lesches, MD Electrophysiologist:  None   Chief Complaint  Patient presents with  . Cardiac follow-up    History of Present Illness: [name] is a 70 y.o. female last seen in October 2021.  She presents for a routine visit.  From a cardiac perspective, she reports stable, intermittent sense of palpitations, no sudden dizziness or syncope, no exertional chest pain.  We have been following her over time for frequent PVCs, likely benign and of outflow tract origin with previously documented normal LVEF.  She was seen recently by Dr. Thermon [name] with [facility] for evaluation of bariatric surgery.  She is referred for preoperative cardiac assessment.  Her last echocardiogram was at [facility] back in 2020, LVEF 60 to 65% at that point.  I reviewed her medications, she continues on high-dose Toprol-XL 100 mg twice daily.  I reviewed her ECG from October 2021.  Past Medical History:  Diagnosis Date  . Anxiety   . Arthritis   . COPD (chronic obstructive pulmonary disease) ([place])   . Depression   . Diverticulitis 04/2012   [facility]  . Essential hypertension   . Frequent PVCs    Outflow tract, positive in the inferior leads  . GERD (gastroesophageal reflux disease)   . Hepatitis C    Treated  . History of pneumonia   . Insomnia   . Migraine headache   . Splenomegaly   . Thyroid disease   . Wears glasses     Past Surgical History:  Procedure Laterality Date  . ABDOMINAL HYSTERECTOMY    . ANKLE SURGERY Right   . COLON SURGERY     "part of color removed"  . COLONOSCOPY    . ESOPHAGEAL MANOMETRY N/A 04/10/2014   Procedure: ESOPHAGEAL MANOMETRY (EM);  Surgeon: Monique Mayer, MD;  [facility];  Service: Endoscopy;  Laterality: N/A;  . ESOPHAGOGASTRODUODENOSCOPY    . INCISIONAL  HERNIA REPAIR N/A 10/24/2015   Procedure: LAPAROSCOPIC REPAIR INCISIONAL HERNIA WITH MESH;  Surgeon: Monique Skeans, MD;  Location: [place];  Service: General;  Laterality: N/A;  . INSERTION OF MESH N/A 10/24/2015   Procedure: INSERTION OF MESH;  Surgeon: Monique Skeans, MD;  Location: [place];  Service: General;  Laterality: N/A;  . JOINT REPLACEMENT    . KNEE SURGERY Right    x4  . [place] IMPEDANCE STUDY N/A 04/10/2014   Procedure: [place] IMPEDANCE STUDY;  Surgeon: Monique Mayer, MD;  [facility];  Service: Endoscopy;  Laterality: N/A;  . TOTAL SHOULDER ARTHROPLASTY Left 04/22/2018   Procedure: Left total shoulder arthroplasty;  Surgeon: Monique Britain, MD;  [facility];  Service: Orthopedics;  Laterality: Left;  176mn    Current Outpatient Medications  Medication Sig Dispense Refill  . albuterol (PROVENTIL HFA;VENTOLIN HFA) 108 (90 Base) MCG/ACT inhaler Inhale 2 puffs into the lungs every 6 (six) hours as needed for wheezing or shortness of breath.    .Monique Kitchenalbuterol (PROVENTIL) (2.5 MG/3ML) 0.083% nebulizer solution Take 2.5 mg by nebulization every 6 (six) hours as needed for wheezing or shortness of breath.    . ALPRAZolam (XANAX) 1 MG tablet Take 1 tablet (1 mg total) by mouth 4 (four) times daily. 120 tablet 3  . aspirin EC 81 MG tablet Take 81 mg by mouth daily.    . Cyanocobalamin (  VITAMIN B-12 IJ) Inject as directed every 30 (thirty) days.    . cyclobenzaprine (FLEXERIL) 10 MG tablet Take 10 mg by mouth every 8 (eight) hours as needed.    [name] Calcium (STOOL SOFTENER PO) Take 2 capsules by mouth daily.    . ergocalciferol (VITAMIN D2) 1.25 MG (50000 UT) capsule Take 50,000 Units by mouth 2 (two) times a week.    [name] FLUoxetine (PROZAC) 40 MG capsule Take 1 capsule (40 mg total) by mouth daily. 30 capsule 3  . Fluticasone-Umeclidin-Vilant (TRELEGY ELLIPTA IN) Inhale into the lungs as needed.     . gabapentin (NEURONTIN) 100 MG capsule Take 300 mg by mouth at bedtime.     [name]  linaclotide (LINZESS) 145 MCG CAPS capsule Take 1 capsule (145 mcg total) by mouth daily. 90 capsule 1  . meloxicam (MOBIC) 15 MG tablet Take 1 tablet (15 mg total) by mouth daily. 30 tablet 1  . metoprolol succinate (TOPROL-XL) 100 MG 24 hr tablet Take 1 tablet (100 mg total) by mouth 2 (two) times daily. Take with or immediately following a meal. 180 tablet 3  . Multiple Vitamin (MULTI-VITAMIN PO) Take 1 tablet by mouth daily.    [name] nystatin (MYCOSTATIN) powder Apply 1 g topically as needed (under breast).   2  . polyethylene glycol powder (MIRALAX) powder Take 17 g by mouth daily. (Patient taking differently: Take 17 g by mouth daily as needed for moderate constipation.) 255 g 0  . Simethicone (GAS RELIEF PO) Take 2 capsules by mouth as needed (flatulence).     . temazepam (RESTORIL) 30 MG capsule Take 1 capsule (30 mg total) by mouth at bedtime as needed. for sleep 30 capsule 3  . traMADol (ULTRAM) 50 MG tablet Take 1 tablet (50 mg total) by mouth every 6 (six) hours as needed. 40 tablet 2   No current facility-administered medications for this visit.   Allergies:  Levothyroxine, Tylenol [acetaminophen], and Nabumetone   ROS: No syncope.  Physical Exam: VS:  BP (!) 142/96   Pulse 78   Ht 5' 3"  (1.6 m)   Wt 212 lb 6.4 oz (96.3 kg)   SpO2 96%   BMI 37.62 kg/m , BMI Body mass index is 37.62 kg/m.  Wt Readings from Last 3 Encounters:  05/09/20 212 lb 6.4 oz (96.3 kg)  01/19/20 220 lb 14.4 oz (100.2 kg)  12/26/19 220 lb (99.8 kg)    General: Patient appears comfortable at rest. HEENT: Conjunctiva and lids normal, wearing a mask. Neck: Supple, no elevated JVP or carotid bruits, no thyromegaly. Lungs: Clear to auscultation, nonlabored breathing at rest. Cardiac: Regular rate and rhythm with occasional ectopic beat, no S3 or significant systolic murmur, no pericardial rub. Extremities: No pitting edema.  ECG:  An ECG dated 12/26/2019 was personally reviewed today and demonstrated:   Sinus rhythm with frequent PVCs, upright in the inferior leads and likely of outflow tract origin.  Recent Labwork: 10/17/2019: TSH 3.26 01/19/2020: ALT 28; AST 31; BUN 17; Creat 0.75; Potassium 4.1; Sodium 139   Other Studies Reviewed Today:  Echocardiogram performed on October 2020 reported mild LVH with LVEF 60 to 65%, mild left atrial enlargement, mildly calcified mitral leaflets, trace tricuspid regurgitation, no pericardial effusion.  Zio patch with 11-day and 17-hour analysis time showed predominant rhythm being sinus with heart rate ranging from 52 bpm up to 114 bpm and average heart rate 74 bpm. There were rare PACs and brief episodes of SVT, the longest of which lasted  7 beats. There were frequent PVCs noted however representing 20% of total beats with some bigeminy and trigeminy, but no sustained ventricular events.  Lexiscan Myoview 02/09/2019:  There was no ST segment deviation noted during stress.  Normal myocardial perfusion. Extensive soft tissue attenuation noted.  This is an intermediate risk study, primarily based on reduced LVEF. However, LV function appears grossly normal and the reduced LVEF may be due to gating artifact. If echocardiogram is performed and LV systolic function is found to be normal, this would be considered a low risk study.  Nuclear stress EF: 37%.  Assessment and Plan:  1.  Preoperative cardiac assessments in a 70 year old woman with history of suspected outflow tract PVCs and normal LVEF, well controlled symptomatically on high-dose beta-blocker, also hypertension.  She is being considered for bariatric surgery as noted above, undergoing evaluation process and not yet scheduled.  We will obtain a follow-up echocardiogram, and if LVEF remains normal, no further cardiac testing is anticipated.  RCRI cardiac risk calculator indicates class II, 0.9% chance of major adverse cardiac event.  2.  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  Continue Toprol-XL at current dose.  Follow-up echocardiogram being obtained to ensure normal LVEF.  3.  Essential hypertension, followed by Dr. Woody Seller.  Blood pressure is elevated today compared to prior assessment.  Keep follow-up with PCP in case further adjustments are needed.  Medication Adjustments/Labs and Tests Ordered: Current medicines are reviewed at length with the patient today.  Concerns regarding medicines are outlined above.   Tests Ordered: Orders Placed This Encounter  Procedures  . ECHOCARDIOGRAM COMPLETE    Medication Changes: No orders of the defined types were placed in this encounter.   Disposition:  Follow up 6 months in the [place] office.  Signed, Satira Sark, MD, [facility] 05/09/2020 10:18 AM    [facility] [place]. [address] [place], [place] [address] Phone: [phone]; Fax: [phone]

## 2020-05-09 NOTE — Patient Instructions (Signed)
Medication Instructions:  Your physician recommends that you continue on your current medications as directed. Please refer to the Current Medication list given to you today.   *If you need a refill on your cardiac medications before your next appointment, please call your pharmacy*   Lab Work: NONE  If you have labs (blood work) drawn today and your tests are completely normal, you will receive your results only by: Marland Kitchen MyChart Message (if you have MyChart) OR . A paper copy in the mail If you have any lab test that is abnormal or we need to change your treatment, we will call you to review the results.   Testing/Procedures: Your physician has requested that you have an echocardiogram. Echocardiography is a painless test that uses sound waves to create images of your heart. It provides your doctor with information about the size and shape of your heart and how well your heart's chambers and valves are working. This procedure takes approximately one hour. There are no restrictions for this procedure.   Follow-Up: At [facility], you and your health needs are our priority.  As part of our continuing mission to provide you with exceptional heart care, we have created designated Provider Care Teams.  These Care Teams include your primary Cardiologist (physician) and Advanced Practice Providers (APPs -  Physician Assistants and Nurse Practitioners) who all work together to provide you with the care you need, when you need it.  We recommend signing up for the patient portal called "MyChart".  Sign up information is provided on this After Visit Summary.  MyChart is used to connect with patients for Virtual Visits (Telemedicine).  Patients are able to view lab/test results, encounter notes, upcoming appointments, etc.  Non-urgent messages can be sent to your provider as well.   To learn more about what you can do with MyChart, go to [email or web address].    Your next appointment:   6  month(s)  The format for your next appointment:   In Person  Provider:   Rozann Lesches, MD   Other Instructions Thank you for choosing [facility]!

## 2020-05-25 ENCOUNTER — Other Ambulatory Visit: Payer: Self-pay

## 2020-05-25 ENCOUNTER — Ambulatory Visit (HOSPITAL_COMMUNITY)
Admission: RE | Admit: 2020-05-25 | Discharge: 2020-05-25 | Disposition: A | Discharge status: Home / Self Care | Payer: Medicare Other | Source: Ambulatory Visit | Attending: Cardiology | Admitting: Cardiology

## 2020-05-25 DIAGNOSIS — I10 Essential (primary) hypertension: Secondary | ICD-10-CM | POA: Diagnosis not present

## 2020-05-25 DIAGNOSIS — K219 Gastro-esophageal reflux disease without esophagitis: Secondary | ICD-10-CM | POA: Insufficient documentation

## 2020-05-25 DIAGNOSIS — J449 Chronic obstructive pulmonary disease, unspecified: Secondary | ICD-10-CM | POA: Diagnosis not present

## 2020-05-25 DIAGNOSIS — I340 Nonrheumatic mitral (valve) insufficiency: Secondary | ICD-10-CM | POA: Diagnosis not present

## 2020-05-25 DIAGNOSIS — I493 Ventricular premature depolarization: Secondary | ICD-10-CM | POA: Diagnosis not present

## 2020-05-25 DIAGNOSIS — I361 Nonrheumatic tricuspid (valve) insufficiency: Secondary | ICD-10-CM | POA: Diagnosis not present

## 2020-05-25 LAB — ECHOCARDIOGRAM COMPLETE
Area-P 1/2: 4.06 cm2
S' Lateral: 3.4 cm

## 2020-05-25 NOTE — Progress Notes (Signed)
*  PRELIMINARY RESULTS* Echocardiogram 2D Echocardiogram has been performed.  [name] 05/25/2020, 1:51 PM

## 2020-06-01 DIAGNOSIS — J449 Chronic obstructive pulmonary disease, unspecified: Secondary | ICD-10-CM | POA: Diagnosis not present

## 2020-06-01 DIAGNOSIS — I639 Cerebral infarction, unspecified: Secondary | ICD-10-CM | POA: Diagnosis not present

## 2020-06-01 DIAGNOSIS — K7460 Unspecified cirrhosis of liver: Secondary | ICD-10-CM | POA: Diagnosis not present

## 2020-06-01 DIAGNOSIS — I10 Essential (primary) hypertension: Secondary | ICD-10-CM | POA: Diagnosis not present

## 2020-06-01 DIAGNOSIS — Z299 Encounter for prophylactic measures, unspecified: Secondary | ICD-10-CM | POA: Diagnosis not present

## 2020-06-07 ENCOUNTER — Ambulatory Visit (INDEPENDENT_AMBULATORY_CARE_PROVIDER_SITE_OTHER): Payer: Medicare Other | Admitting: Gastroenterology

## 2020-06-07 ENCOUNTER — Encounter (INDEPENDENT_AMBULATORY_CARE_PROVIDER_SITE_OTHER): Payer: Self-pay | Admitting: Gastroenterology

## 2020-06-07 ENCOUNTER — Other Ambulatory Visit: Payer: Self-pay

## 2020-06-07 ENCOUNTER — Encounter (INDEPENDENT_AMBULATORY_CARE_PROVIDER_SITE_OTHER): Payer: Self-pay | Admitting: *Deleted

## 2020-06-07 VITALS — BP 130/75 | HR 71 | Temp 98.6°F | Ht 63.0 in | Wt 216.0 lb

## 2020-06-07 DIAGNOSIS — K581 Irritable bowel syndrome with constipation: Secondary | ICD-10-CM | POA: Diagnosis not present

## 2020-06-07 DIAGNOSIS — K7581 Nonalcoholic steatohepatitis (NASH): Secondary | ICD-10-CM | POA: Diagnosis not present

## 2020-06-07 DIAGNOSIS — R932 Abnormal findings on diagnostic imaging of liver and biliary tract: Secondary | ICD-10-CM | POA: Diagnosis not present

## 2020-06-07 NOTE — Patient Instructions (Addendum)
Schedule liver elastography Perform blood workup Continue Linzess 145 mcg qday

## 2020-06-07 NOTE — Progress Notes (Signed)
[name], M.D. Gastroenterology & Hepatology [facility] [address] [place], [place] [address]  Primary Care Physician: Glenda Chroman, MD [place] [id number]  I will communicate my assessment and recommendations to the referring MD via EMR.  Problems: 1. Fatty liver 2. Abnormal liver imaging 3. IBS-C 4. Drug-induced constipation  History of Present Illness: [name] a 70 y.o.femalewith past medical history ofhepatitis C status post treatment in remission, anxiety, IBS-C, rheumatoid arthritis, COPD, GERD, opioid-induced constipation hypertension, hypothyroidism,?SVT,recurrent diverticulitis s/p partial colectomy and fatty liver, who presents for follow up of fatty liver and after being found to have abnormal liver imaging recently.  The patient was last seen on 01/19/2020. At that time, the patient was counseled to increase her Linzess to 145 mcg every day and to have a CMP checked as she was told recently that she had fatty liver.  The patient brought today her most recent result of her liver ultrasound which was performed on 01/05/2020 which reported fatty liver with surface nodularity suggestive of liver cirrhosis with borderline splenomegaly with max size of 14 cm.  The patient states that she was going to pursue a Roux-en-Y gastric bypass, for which she was undergoing psychiatric and nutritional evaluation but she was advised by her family to follow-up at the gastroenterology clinic before making the decision given the presence of the cirrhotic changes in imaging.  Upon review of her most recent blood testing, the CMP showed mild inversion of her AST to ALT ratio, with AST of 31 and ALT of 28, her total bilirubin was normal at 0.6 and her alkaline phosphatase was 80, her creatinine was 0.25.  Notably, the patient had presence of chronic thrombocytopenia at least since 2015 which has oscillated between  109 and 130,000 on most recent blood testing on 06/07/2020.  The patient denies having any complaints at the moment.  He reported she has a BM every time she takes Linzess, but does not take it on a daily basis and she feels fine with this. The patient denies having any nausea, vomiting, fever, chills, hematochezia, melena, hematemesis, abdominal distention, abdominal pain, diarrhea, jaundice, pruritus or weight loss.  Past Medical History: Past Medical History:  Diagnosis Date   Anxiety    Arthritis    COPD (chronic obstructive pulmonary disease) ([place])    Depression    Diverticulitis 04/2012   [facility]   Essential hypertension    Frequent PVCs    Outflow tract, positive in the inferior leads   GERD (gastroesophageal reflux disease)    Hepatitis C    Treated   History of pneumonia    Insomnia    Migraine headache    Splenomegaly    Thyroid disease    Wears glasses     Past Surgical History: Past Surgical History:  Procedure Laterality Date   ABDOMINAL HYSTERECTOMY     ANKLE SURGERY Right    COLON SURGERY     "part of color removed"   COLONOSCOPY     ESOPHAGEAL MANOMETRY N/A 04/10/2014   Procedure: ESOPHAGEAL MANOMETRY (EM);  Surgeon: Gatha Mayer, MD;  [facility];  Service: Endoscopy;  Laterality: N/A;   ESOPHAGOGASTRODUODENOSCOPY     INCISIONAL HERNIA REPAIR N/A 10/24/2015   Procedure: LAPAROSCOPIC REPAIR INCISIONAL HERNIA WITH MESH;  Surgeon: Georganna Skeans, MD;  Location: [place];  Service: General;  Laterality: N/A;   INSERTION OF MESH N/A 10/24/2015   Procedure: INSERTION OF MESH;  Surgeon: Georganna Skeans, MD;  Location: [place];  Service: General;  Laterality: N/A;   JOINT REPLACEMENT     KNEE SURGERY Right    x4   [place] IMPEDANCE STUDY N/A 04/10/2014   Procedure: [place] IMPEDANCE STUDY;  Surgeon: Gatha Mayer, MD;  [facility];  Service: Endoscopy;  Laterality: N/A;   TOTAL SHOULDER ARTHROPLASTY Left 04/22/2018    Procedure: Left total shoulder arthroplasty;  Surgeon: Justice Britain, MD;  [facility];  Service: Orthopedics;  Laterality: Left;  189mn    Family History: Family History  Problem Relation Age of Onset   Alcohol abuse Father    Dementia Father    Dementia Maternal Grandmother    Bipolar disorder Daughter    Anxiety disorder Daughter    Depression Brother    Drug abuse Brother    Alcohol abuse Paternal Uncle    Depression Paternal Uncle    ADD / ADHD Neg Hx     Social History: Social History   Tobacco Use  Smoking Status Never Smoker  Smokeless Tobacco Never Used   Social History   Substance and Sexual Activity  Alcohol Use Yes   Alcohol/week: 3.0 standard drinks   Types: 3 Cans of beer per week   Comment: occasionally   Social History   Substance and Sexual Activity  Drug Use No    Allergies: Allergies  Allergen Reactions   Levothyroxine Hives and Itching   Tylenol [Acetaminophen] Other (See Comments)    Liver function per MD    Nabumetone Rash    Medications: Current Outpatient Medications  Medication Sig Dispense Refill   albuterol (PROVENTIL HFA;VENTOLIN HFA) 108 (90 Base) MCG/ACT inhaler Inhale 2 puffs into the lungs every 6 (six) hours as needed for wheezing or shortness of breath.     albuterol (PROVENTIL) (2.5 MG/3ML) 0.083% nebulizer solution Take 2.5 mg by nebulization every 6 (six) hours as needed for wheezing or shortness of breath.     ALPRAZolam (XANAX) 1 MG tablet Take 1 tablet (1 mg total) by mouth 4 (four) times daily. 120 tablet 3   aspirin EC 81 MG tablet Take 81 mg by mouth daily.     cyclobenzaprine (FLEXERIL) 10 MG tablet Take 10 mg by mouth every 8 (eight) hours as needed.     Docusate Calcium (STOOL SOFTENER PO) Take 2 capsules by mouth daily.     ergocalciferol (VITAMIN D2) 1.25 MG (50000 UT) capsule Take 50,000 Units by mouth 2 (two) times a week.     FLUoxetine (PROZAC) 40 MG capsule Take 1 capsule (40 mg  total) by mouth daily. 30 capsule 3   Fluticasone-Umeclidin-Vilant (TRELEGY ELLIPTA IN) Inhale into the lungs as needed.      gabapentin (NEURONTIN) 100 MG capsule Take 300 mg by mouth at bedtime.      linaclotide (LINZESS) 145 MCG CAPS capsule Take 1 capsule (145 mcg total) by mouth daily. 90 capsule 1   meloxicam (MOBIC) 15 MG tablet Take 1 tablet (15 mg total) by mouth daily. 30 tablet 1   metoprolol succinate (TOPROL-XL) 100 MG 24 hr tablet Take 1 tablet (100 mg total) by mouth 2 (two) times daily. Take with or immediately following a meal. 180 tablet 3   Multiple Vitamin (MULTI-VITAMIN PO) Take 1 tablet by mouth daily.     nystatin (MYCOSTATIN) powder Apply 1 g topically as needed (under breast).   2   polyethylene glycol powder (MIRALAX) powder Take 17 g by mouth daily. (Patient taking differently: Take  17 g by mouth daily as needed for moderate constipation.) 255 g 0   Simethicone (GAS RELIEF PO) Take 2 capsules by mouth as needed (flatulence).      temazepam (RESTORIL) 30 MG capsule Take 1 capsule (30 mg total) by mouth at bedtime as needed. for sleep 30 capsule 3   traMADol (ULTRAM) 50 MG tablet Take 1 tablet (50 mg total) by mouth every 6 (six) hours as needed. 40 tablet 2   Cyanocobalamin (VITAMIN B-12 IJ) Inject as directed every 30 (thirty) days. (Patient not taking: Reported on 06/07/2020)     No current facility-administered medications for this visit.    Review of Systems: GENERAL: negative for malaise, night sweats HEENT: No changes in hearing or vision, no nose bleeds or other nasal problems. NECK: Negative for lumps, goiter, pain and significant neck swelling RESPIRATORY: Negative for cough, wheezing CARDIOVASCULAR: Negative for chest pain, leg swelling, palpitations, orthopnea GI: SEE HPI MUSCULOSKELETAL: Negative for joint pain or swelling, back pain, and muscle pain. SKIN: Negative for lesions, rash PSYCH: Negative for sleep disturbance, mood disorder and  recent psychosocial stressors. HEMATOLOGY Negative for prolonged bleeding, bruising easily, and swollen nodes. ENDOCRINE: Negative for cold or heat intolerance, polyuria, polydipsia and goiter. NEURO: negative for tremor, gait imbalance, syncope and seizures. The remainder of the review of systems is noncontributory.   Physical Exam: BP 130/75 (BP Location: Left Arm, Patient Position: Sitting, Cuff Size: Large)    Pulse 71    Temp 98.6 F (37 C) (Oral)    Ht 5' 3"  (1.6 m)    Wt 216 lb (98 kg)    BMI 38.26 kg/m  GENERAL: The patient is AO x3, in no acute distress. Obese. HEENT: Head is normocephalic and atraumatic. EOMI are intact. Mouth is well hydrated and without lesions. NECK: Supple. No masses LUNGS: Clear to auscultation. No presence of rhonchi/wheezing/rales. Adequate chest expansion HEART: RRR, normal s1 and s2. ABDOMEN: Soft, nontender, no guarding, no peritoneal signs, and nondistended. BS +. No masses. EXTREMITIES: Without any cyanosis, clubbing, rash, lesions or edema. NEUROLOGIC: AOx3, no focal motor deficit. SKIN: no jaundice, no rashes  Imaging/Labs: as above  I personally reviewed and interpreted the available labs, imaging and endoscopic files.  Impression and Plan: [name] a 70 y.o.femalewith past medical history ofhepatitis C status post treatment in remission, anxiety, IBS-C, rheumatoid arthritis, COPD, GERD, opioid-induced constipation hypertension, hypothyroidism,?SVT,recurrent diverticulitis s/p partial colectomy and fatty liver, who presents for follow up of fatty liver and after being found to have abnormal liver imaging recently.  The patient had presence of nodal controlling her most recent liver ultrasound along with an inversion in her AST to ALT ratio and presence of chronic thrombocytopenia.  These findings could be suggestive of liver cirrhosis which I consider could be related to NASH given her multiple comorbidities but also it is important  to note that she had hepatitis C which could have led to liver fibrosis.  She achieved SVR in the past and currently has no persistent elevation of her aminotransferases.  I will check for other causes such as viral, autoimmune and metabolic etiologies for cirrhosis but also will confirm this diagnosis with a liver elastography, will also check AFP and INR today.  I had a thorough discussion with the patient regarding the safety and benefits of performing a Roux-en-Y gastric bypass in the setting of compensated liver cirrhosis.  Even though some studies have shown some benefits at the metabolic level in this population, overall, there has  been a report of higher morbidity and mortality compared to noncirrhotic patients.  We will reconfirm if she has liver cirrhosis, but if so, I will recommend having an evaluation by a tertiary center with expertise in these procedures to avoid any potential complications in the future.  - Schedule liver elastography - Check hepatitis A/B serologies, iron panel, ANA, AMA, ASMA, IgG - Continue Linzess 145 mcg qday  All questions were answered.      Harvel Quale, MD Gastroenterology and Hepatology [facility]

## 2020-06-12 LAB — HCV RNA,QUANTITATIVE REAL TIME PCR
HCV Quantitative Log: <1.18 Log IU/mL
HCV RNA, PCR, QN: <15 IU/mL

## 2020-06-12 LAB — HEPATITIS PANEL, ACUTE
Hep A IgM: NONREACTIVE
Hep B C IgM: NONREACTIVE
Hepatitis B Surface Ag: NONREACTIVE
Hepatitis C Ab: REACTIVE — AB
SIGNAL TO CUT-OFF: 22.9 — ABNORMAL HIGH (ref <1.00)

## 2020-06-12 LAB — CBC WITH DIFFERENTIAL/PLATELET
Absolute Monocytes: 640 cells/uL (ref 200–950)
Basophils Absolute: 59 cells/uL (ref 0–200)
Basophils Relative: 0.9 %
Eosinophils Absolute: 264 cells/uL (ref 15–500)
Eosinophils Relative: 4 %
HCT: 41.3 % (ref 35.0–45.0)
Hemoglobin: 14.1 g/dL (ref 11.7–15.5)
Lymphs Abs: 1676 cells/uL (ref 850–3900)
MCH: 33.3 pg — ABNORMAL HIGH (ref 27.0–33.0)
MCHC: 34.1 g/dL (ref 32.0–36.0)
MCV: 97.6 fL (ref 80.0–100.0)
MPV: 11.5 fL (ref 7.5–12.5)
Monocytes Relative: 9.7 %
Neutro Abs: 3960 cells/uL (ref 1500–7800)
Neutrophils Relative %: 60 %
Platelets: 130 10*3/uL — ABNORMAL LOW (ref 140–400)
RBC: 4.23 10*6/uL (ref 3.80–5.10)
RDW: 14.4 % (ref 11.0–15.0)
Total Lymphocyte: 25.4 %
WBC: 6.6 10*3/uL (ref 3.8–10.8)

## 2020-06-12 LAB — IRON, TOTAL/TOTAL IRON BINDING CAP
%SAT: 40 % (calc) (ref 16–45)
Iron: 157 ug/dL (ref 45–160)
TIBC: 392 mcg/dL (calc) (ref 250–450)

## 2020-06-12 LAB — AFP TUMOR MARKER: AFP-Tumor Marker: 8.1 ng/mL — ABNORMAL HIGH

## 2020-06-12 LAB — MITOCHONDRIAL ANTIBODIES: Mitochondrial M2 Ab, IgG: <=20 U

## 2020-06-12 LAB — FERRITIN: Ferritin: 141 ng/mL (ref 16–288)

## 2020-06-12 LAB — IGG: IgG (Immunoglobin G), Serum: 1055 mg/dL (ref 600–1540)

## 2020-06-12 LAB — HEPATITIS B SURFACE ANTIBODY,QUALITATIVE: Hep B S Ab: NONREACTIVE

## 2020-06-12 LAB — HEPATITIS A ANTIBODY, TOTAL: Hepatitis A AB,Total: NONREACTIVE

## 2020-06-12 LAB — PROTIME-INR
INR: 1
Prothrombin Time: 10.1 s (ref 9.0–11.5)

## 2020-06-12 LAB — ANTI-SMOOTH MUSCLE ANTIBODY, IGG: Actin (Smooth Muscle) Antibody (IGG): <20 U (ref <20)

## 2020-06-15 ENCOUNTER — Ambulatory Visit (HOSPITAL_COMMUNITY)
Admission: RE | Admit: 2020-06-15 | Discharge: 2020-06-15 | Disposition: A | Discharge status: Home / Self Care | Payer: Medicare Other | Source: Ambulatory Visit | Attending: Gastroenterology | Admitting: Gastroenterology

## 2020-06-15 DIAGNOSIS — K760 Fatty (change of) liver, not elsewhere classified: Secondary | ICD-10-CM | POA: Diagnosis not present

## 2020-06-15 DIAGNOSIS — K7581 Nonalcoholic steatohepatitis (NASH): Secondary | ICD-10-CM | POA: Insufficient documentation

## 2020-06-15 DIAGNOSIS — R932 Abnormal findings on diagnostic imaging of liver and biliary tract: Secondary | ICD-10-CM | POA: Insufficient documentation

## 2020-06-18 ENCOUNTER — Encounter: Payer: Self-pay | Admitting: Skilled Nursing Facility1

## 2020-06-18 ENCOUNTER — Encounter: Payer: Medicare Other | Attending: Surgery | Admitting: Skilled Nursing Facility1

## 2020-06-18 ENCOUNTER — Other Ambulatory Visit: Payer: Self-pay

## 2020-06-18 NOTE — Progress Notes (Signed)
Nutrition Assessment for Bariatric Surgery Medical Nutrition Therapy Appt Start Time: 10:30 End Time: 11:42  Patient was seen on 06/18/2020 for Pre-Operative Nutrition Assessment. Letter of approval faxed to [facility] Surgery bariatric surgery program coordinator on 06/18/2020  Referral stated Supervised Weight Loss (SWL) visits needed: 6  Planned surgery: RYGB Pt expectation of surgery: to lose weight Pt expectation of dietitian: to teach how to control diet and work with me each step of the way; a cheerleader     NUTRITION ASSESSMENT   Anthropometrics  Start weight at [facility]: 216.7 lbs (date: 06/18/2020)  Height: 63 in BMI: 38.39 kg/m2     Clinical  Medical hx: GERD, COPD Medications: see list; laxative Labs:  Notable signs/symptoms: shortness of breath, knee pain Any previous deficiencies? No  Micronutrient Nutrition Focused Physical Exam: Hair: No issues observed Eyes: No issues observed Mouth: No issues observed Neck: No issues observed Nails: No issues observed Skin: No issues observed  Lifestyle & Dietary Hx  Pt states she lives alone and would like the dietitian to be her cheerleader.  Pt states she does eat after going to bed about 3-4 times a week.  Pt states she would like to find a job but has had trouble finding one.  Pt states she takes a laxative due to constipation. Pt state she has started using an air fryer rather than cooking on grease.  Pt states her brothers and children are supportive.   24-Hr Dietary Recall First Meal: skipped or frozen breakfast sandwich Snack:  Second Meal: sandwich + chips Snack: cookies Third Meal: roast + mashed potatoes + pinto beans + biscuits  Snack: cookies Beverages: soda, water, flavored water, beer, milk, lemonade   Estimated Energy Needs Calories: 1600  NUTRITION DIAGNOSIS  Overweight/obesity ([place]-3.3) related to past poor dietary habits and physical inactivity as evidenced by patient w/ planned RYGB  surgery following dietary guidelines for continued weight loss.    NUTRITION INTERVENTION  Nutrition counseling (C-1) and education (E-2) to facilitate bariatric surgery goals.   Pre-Op Goals Reviewed with the Patient . Track food and beverage intake (pen and paper, MyFitness Pal, Baritastic app, etc.) . Make healthy food choices while monitoring portion sizes: eat non starchy vegetables 7 daysa  Week  . Consume 3 meals per day or try to eat every 3-5 hours . Avoid concentrated sugars and fried foods . Keep sugar & fat in the single digits per serving on food labels . Practice CHEWING your food (aim for applesauce consistency): tunr off tv, sit at the table, put fork down in between each bite . Practice not drinking 15 minutes before, during, and 30 minutes after each meal and snack . Avoid all carbonated beverages (ex: soda, sparkling beverages)  . Limit caffeinated beverages (ex: coffee, tea, energy drinks) . Avoid all sugar-sweetened beverages (ex: regular soda, sports drinks)  . Avoid alcohol  . Aim for 64-100 ounces of FLUID daily (with at least half of fluid intake being plain water)  . Aim for at least 60-80 grams of PROTEIN daily . Look for a liquid protein source that contains ?15 g protein and ?5 g carbohydrate (ex: shakes, drinks, shots) . Make a list of non-food related activities . Physical activity is an important part of a healthy lifestyle so keep it moving! The goal is to reach 150 minutes of exercise per week, including cardiovascular and weight baring activity.  *Goals that are bolded indicate the pt would like to start working towards these  Handouts Provided Include  .  Bariatric Surgery handouts (Nutrition Visits, Pre-Op Goals, Protein Shakes, Vitamins & Minerals)  Learning Style & Readiness for Change Teaching method utilized: Visual & Auditory  Demonstrated degree of understanding via: Teach Back  Readiness Level: pre contemplative  Barriers to  learning/adherence to lifestyle change:   RD's Notes for Next Visit . Assess pts adherence to chosen goals      MONITORING & EVALUATION Dietary intake, weekly physical activity, body weight, and pre-op goals reached at next nutrition visit.    Next Steps  Patient is to follow up at [place] for Pre-Op Class >2 weeks before surgery for further nutrition education.  Next SWL visit

## 2020-06-19 ENCOUNTER — Telehealth (INDEPENDENT_AMBULATORY_CARE_PROVIDER_SITE_OTHER): Payer: Self-pay | Admitting: Gastroenterology

## 2020-06-19 NOTE — Telephone Encounter (Signed)
Patient called regarding lab results - please advise - ph# [phone]

## 2020-06-19 NOTE — Telephone Encounter (Signed)
I spoke with the patient regarding the results of the blood testing which are consistent with NASH, even though some of her labs are slightly abnormal and may suggest portal hypertension, the liver elastography showed a low likelihood of being concerning for cirrhosis.  Even if she has some degree of fibrosis, her [place] score for a gastric bypass surgery risk is extremely low with 30-day mortality of 0.1%, negative mortality of 0.5%, and 180-day mortality of 0.8%.  Disease I think it is adequate for her to undergo a Roux-en-Y gastric bypass at [facility] as she needs larger volume presented to perform this type of procedure.  Gastric bypass will help her lose weight and will also help her improve her NASH.  We will need to repeat a liver elastography in 1 year.  The patient understood and agreed.

## 2020-07-16 ENCOUNTER — Encounter: Payer: Medicare Other | Attending: Surgery | Admitting: Skilled Nursing Facility1

## 2020-07-16 ENCOUNTER — Other Ambulatory Visit: Payer: Self-pay

## 2020-07-16 DIAGNOSIS — N189 Chronic kidney disease, unspecified: Secondary | ICD-10-CM | POA: Insufficient documentation

## 2020-07-16 DIAGNOSIS — Z713 Dietary counseling and surveillance: Secondary | ICD-10-CM | POA: Insufficient documentation

## 2020-07-16 DIAGNOSIS — K219 Gastro-esophageal reflux disease without esophagitis: Secondary | ICD-10-CM | POA: Insufficient documentation

## 2020-07-16 DIAGNOSIS — Z6838 Body mass index (BMI) 38.0-38.9, adult: Secondary | ICD-10-CM | POA: Insufficient documentation

## 2020-07-16 DIAGNOSIS — J449 Chronic obstructive pulmonary disease, unspecified: Secondary | ICD-10-CM | POA: Insufficient documentation

## 2020-07-16 NOTE — Progress Notes (Signed)
Supervised Weight Loss Visit Bariatric Nutrition Education  Referral stated Supervised Weight Loss (SWL) visits needed: 6  1 out of 6 SWL visits   Planned surgery: RYGB Pt expectation of surgery: to lose weight Pt expectation of dietitian: to teach how to control diet and work with me each step of the way; a cheerleader   NUTRITION ASSESSMENT   Anthropometrics  Start weight at [facility]: 216.7 lbs (date: 06/18/2020)  Weight: 215 pounds Height: 63 in BMI: 38.09 kg/m2     Clinical  Medical hx: GERD, COPD, CKD, diverticulosis  Medications: see list; laxative, vitamin D supplement, B12 supplement  Labs:  Notable signs/symptoms: shortness of breath, knee pain Any previous deficiencies? Vitamin D, vitamin b12  Lifestyle & Dietary Hx  Pt states this past month has been challenging: using the air fryer, reducing mayonnaise and butter. Pt states she has been working on drinking more water and using low sugar drink products. Pt states drinking more water has made her feel more full. Pt states she has always known when she needs sugar feeling like she is faint.   Estimated daily fluid intake: 64+ oz Supplements: b12 and vitamin D Current average weekly physical activity: ADL's  24-Hr Dietary Recall First Meal: sausage and egg Snack: Second Meal: tomato sandwich Snack:  Third Meal: steak + potato Snack: peanut butter chocolate bar (after having gone to bed) Beverages: 4-5 bottles water + sugar free flavorings, sugar free green tea watermelon   Estimated Energy Needs Calories: 1600   NUTRITION DIAGNOSIS  Overweight/obesity ([place]-3.3) related to past poor dietary habits and physical inactivity as evidenced by patient w/ planned RYGB surgery following dietary guidelines for continued weight loss.   NUTRITION INTERVENTION  Nutrition counseling (C-1) and education (E-2) to facilitate bariatric surgery goals.  Pre-Op Goals Progress & New Goals -Continue: Make healthy food choices while  monitoring portion sizes: eat non starchy vegetables 7 days a Week  -Practice CHEWING your food (aim for applesauce consistency): tunr off tv, sit at the table, put fork down in between each bite NEW: Aim for 2 bottles of water plain NEW: start at [employer] 1-2 days a week    Handouts Provided Include   Meal Ideas  Learning Style & Readiness for Change Teaching method utilized: Visual & Auditory  Demonstrated degree of understanding via: Teach Back  Readiness Level: Action Barriers to learning/adherence to lifestyle change: none identified   RD's Notes for next Visit  . Assess pts adherence to chosen goals   MONITORING & EVALUATION Dietary intake, weekly physical activity, body weight, and pre-op goals in 1 month.   Next Steps  Patient is to return to [facility] in 1 month

## 2020-07-20 DIAGNOSIS — M1990 Unspecified osteoarthritis, unspecified site: Secondary | ICD-10-CM | POA: Diagnosis not present

## 2020-07-20 DIAGNOSIS — I471 Supraventricular tachycardia: Secondary | ICD-10-CM | POA: Diagnosis not present

## 2020-07-20 DIAGNOSIS — J449 Chronic obstructive pulmonary disease, unspecified: Secondary | ICD-10-CM | POA: Diagnosis not present

## 2020-07-20 DIAGNOSIS — I10 Essential (primary) hypertension: Secondary | ICD-10-CM | POA: Diagnosis not present

## 2020-07-20 DIAGNOSIS — Z299 Encounter for prophylactic measures, unspecified: Secondary | ICD-10-CM | POA: Diagnosis not present

## 2020-07-30 ENCOUNTER — Telehealth: Payer: Medicare Other | Admitting: Emergency Medicine

## 2020-07-30 DIAGNOSIS — J449 Chronic obstructive pulmonary disease, unspecified: Secondary | ICD-10-CM | POA: Diagnosis not present

## 2020-07-30 DIAGNOSIS — N390 Urinary tract infection, site not specified: Secondary | ICD-10-CM | POA: Diagnosis not present

## 2020-07-30 DIAGNOSIS — I10 Essential (primary) hypertension: Secondary | ICD-10-CM | POA: Diagnosis not present

## 2020-07-30 DIAGNOSIS — Z299 Encounter for prophylactic measures, unspecified: Secondary | ICD-10-CM | POA: Diagnosis not present

## 2020-07-30 NOTE — Progress Notes (Signed)
Unable to make contact with patient via [employer] after multiple attempts. Was able to speak briefly on phone with patient before she had to hang up to answer a different phone call.  Pt states she will try to call back later.

## 2020-08-13 DIAGNOSIS — I10 Essential (primary) hypertension: Secondary | ICD-10-CM | POA: Diagnosis not present

## 2020-08-13 DIAGNOSIS — J449 Chronic obstructive pulmonary disease, unspecified: Secondary | ICD-10-CM | POA: Diagnosis not present

## 2020-08-15 ENCOUNTER — Other Ambulatory Visit (HOSPITAL_COMMUNITY): Payer: Self-pay | Admitting: Psychiatry

## 2020-08-15 DIAGNOSIS — E039 Hypothyroidism, unspecified: Secondary | ICD-10-CM | POA: Diagnosis not present

## 2020-08-15 DIAGNOSIS — R5383 Other fatigue: Secondary | ICD-10-CM | POA: Diagnosis not present

## 2020-08-15 DIAGNOSIS — B356 Tinea cruris: Secondary | ICD-10-CM | POA: Diagnosis not present

## 2020-08-15 DIAGNOSIS — Z0000 Encounter for general adult medical examination without abnormal findings: Secondary | ICD-10-CM | POA: Diagnosis not present

## 2020-08-15 DIAGNOSIS — Z7189 Other specified counseling: Secondary | ICD-10-CM | POA: Diagnosis not present

## 2020-08-15 DIAGNOSIS — Z299 Encounter for prophylactic measures, unspecified: Secondary | ICD-10-CM | POA: Diagnosis not present

## 2020-08-15 DIAGNOSIS — I10 Essential (primary) hypertension: Secondary | ICD-10-CM | POA: Diagnosis not present

## 2020-08-15 DIAGNOSIS — E7800 Pure hypercholesterolemia, unspecified: Secondary | ICD-10-CM | POA: Diagnosis not present

## 2020-08-15 DIAGNOSIS — Z79899 Other long term (current) drug therapy: Secondary | ICD-10-CM | POA: Diagnosis not present

## 2020-08-15 DIAGNOSIS — R109 Unspecified abdominal pain: Secondary | ICD-10-CM | POA: Diagnosis not present

## 2020-08-20 ENCOUNTER — Ambulatory Visit: Payer: Medicare Other | Admitting: Skilled Nursing Facility1

## 2020-08-27 ENCOUNTER — Telehealth (INDEPENDENT_AMBULATORY_CARE_PROVIDER_SITE_OTHER): Payer: Medicare Other | Admitting: Psychiatry

## 2020-08-27 ENCOUNTER — Other Ambulatory Visit: Payer: Self-pay

## 2020-08-27 ENCOUNTER — Encounter (HOSPITAL_COMMUNITY): Payer: Self-pay | Admitting: Psychiatry

## 2020-08-27 DIAGNOSIS — F331 Major depressive disorder, recurrent, moderate: Secondary | ICD-10-CM | POA: Diagnosis not present

## 2020-08-27 MED ORDER — ALPRAZOLAM 1 MG PO TABS: 1.0000 mg | ORAL_TABLET | Freq: Four times a day (QID) | ORAL | 3 refills | Status: DC

## 2020-08-27 MED ORDER — TEMAZEPAM 30 MG PO CAPS: 30.0000 mg | ORAL_CAPSULE | Freq: Every evening | ORAL | 3 refills | Status: DC | PRN

## 2020-08-27 MED ORDER — FLUOXETINE HCL 40 MG PO CAPS: 40.0000 mg | ORAL_CAPSULE | Freq: Every day | ORAL | 3 refills | Status: DC

## 2020-08-27 NOTE — Progress Notes (Signed)
Virtual Visit via Telephone Note  I connected with [name] on 08/27/20 at  1:00 PM EDT by telephone and verified that I am speaking with the correct person using two identifiers.  Location: Patient: home Provider:home office   I discussed the limitations, risks, security and privacy concerns of performing an evaluation and management service by telephone and the availability of in person appointments. I also discussed with the patient that there may be a patient responsible charge related to this service. The patient expressed understanding and agreed to proceed.      I discussed the assessment and treatment plan with the patient. The patient was provided an opportunity to ask questions and all were answered. The patient agreed with the plan and demonstrated an understanding of the instructions.   The patient was advised to call back or seek an in-person evaluation if the symptoms worsen or if the condition fails to improve as anticipated.  I provided 15 minutes of non-face-to-face time during this encounter.   [name], MD  [facility] MD/PA/NP OP Progress Note  08/27/2020 1:15 PM [name]  MRN:  [id number]  Chief Complaint:  Chief Complaint   Anxiety; Depression; Follow-up    HPI: This patient is a 70 year old widowed white female lives alone in [place].  Her daughter and brothers live nearby.  She has 1 son who recently moved back from [place].  She is on disability.  The patient returns for follow-up after 4 months.  She states that in general she is doing okay.  She still has a lot of anxiety about various things such as repair is needed for her house.  She is still getting nutritional help and has to do this for 6 months before she can be approved for bariatric surgery.  She is lost a few pounds.  She denies serious depression or thoughts of suicide.  She is sleeping fairly well most of the time.   Visit Diagnosis:    ICD-10-CM   1. Major depressive disorder,  recurrent episode, moderate (HCC)  F33.1       Past Psychiatric History: none  Past Medical History:  Past Medical History:  Diagnosis Date   Anxiety    Arthritis    COPD (chronic obstructive pulmonary disease) ([place])    Depression    Diverticulitis 04/2012   [facility]   Essential hypertension    Frequent PVCs    Outflow tract, positive in the inferior leads   GERD (gastroesophageal reflux disease)    Hepatitis C    Treated   History of pneumonia    Insomnia    Migraine headache    Splenomegaly    Thyroid disease    Wears glasses     Past Surgical History:  Procedure Laterality Date   ABDOMINAL HYSTERECTOMY     ANKLE SURGERY Right    COLON SURGERY     "part of color removed"   COLONOSCOPY     ESOPHAGEAL MANOMETRY N/A 04/10/2014   Procedure: ESOPHAGEAL MANOMETRY (EM);  Surgeon: Gatha Mayer, MD;  Location: [facility];  Service: Endoscopy;  Laterality: N/A;   ESOPHAGOGASTRODUODENOSCOPY     INCISIONAL HERNIA REPAIR N/A 10/24/2015   Procedure: LAPAROSCOPIC REPAIR INCISIONAL HERNIA WITH MESH;  Surgeon: Georganna Skeans, MD;  Location: [place];  Service: General;  Laterality: N/A;   INSERTION OF MESH N/A 10/24/2015   Procedure: INSERTION OF MESH;  Surgeon: Georganna Skeans, MD;  Location: [place];  Service: General;  Laterality: N/A;   JOINT  REPLACEMENT     KNEE SURGERY Right    x4   PH IMPEDANCE STUDY N/A 04/10/2014   Procedure: [place] IMPEDANCE STUDY;  Surgeon: Gatha Mayer, MD;  Location: [facility];  Service: Endoscopy;  Laterality: N/A;   TOTAL SHOULDER ARTHROPLASTY Left 04/22/2018   Procedure: Left total shoulder arthroplasty;  Surgeon: Justice Britain, MD;  Location: [facility];  Service: Orthopedics;  Laterality: Left;  145mn    Family Psychiatric History: see below  Family History:  Family History  Problem Relation Age of Onset   Alcohol abuse Father    Dementia Father    Dementia Maternal Grandmother    Bipolar disorder Daughter    Anxiety disorder Daughter     Depression Brother    Drug abuse Brother    Alcohol abuse Paternal Uncle    Depression Paternal Uncle    ADD / ADHD Neg Hx     Social History:  Social History   Socioeconomic History   Marital status: Widowed    Spouse name: Not on file   Number of children: 2   Years of education: 12th grade   Highest education level: Not on file  Occupational History   Occupation: RETIRED  Tobacco Use   Smoking status: Never   Smokeless tobacco: Never  Vaping Use   Vaping Use: Never used  Substance and Sexual Activity   Alcohol use: Yes    Alcohol/week: 3.0 standard drinks    Types: 3 Cans of beer per week    Comment: occasionally   Drug use: No   Sexual activity: Never  Other Topics Concern   Not on file  Social History Narrative   Not on file   Social Determinants of Health   Financial Resource Strain: Not on file  Food Insecurity: Not on file  Transportation Needs: Not on file  Physical Activity: Not on file  Stress: Not on file  Social Connections: Not on file    Allergies:  Allergies  Allergen Reactions   Levothyroxine Hives and Itching   Tylenol [Acetaminophen] Other (See Comments)    Liver function per MD    Nabumetone Rash    Metabolic Disorder Labs: No results found for: HGBA1C, MPG No results found for: PROLACTIN No results found for: CHOL, TRIG, HDL, CHOLHDL, VLDL, LDLCALC Lab Results  Component Value Date   TSH 3.26 10/17/2019   TSH 4.540 (H) 10/28/2007    Therapeutic Level Labs: No results found for: LITHIUM No results found for: VALPROATE No components found for:  CBMZ  Current Medications: Current Outpatient Medications  Medication Sig Dispense Refill   albuterol (PROVENTIL HFA;VENTOLIN HFA) 108 (90 Base) MCG/ACT inhaler Inhale 2 puffs into the lungs every 6 (six) hours as needed for wheezing or shortness of breath.     albuterol (PROVENTIL) (2.5 MG/3ML) 0.083% nebulizer solution Take 2.5 mg by nebulization every 6 (six) hours as needed for  wheezing or shortness of breath.     ALPRAZolam (XANAX) 1 MG tablet Take 1 tablet (1 mg total) by mouth 4 (four) times daily. 120 tablet 3   aspirin EC 81 MG tablet Take 81 mg by mouth daily.     Cyanocobalamin (VITAMIN B-12 IJ) Inject as directed every 30 (thirty) days. (Patient not taking: Reported on 06/07/2020)     cyclobenzaprine (FLEXERIL) 10 MG tablet Take 10 mg by mouth every 8 (eight) hours as needed.     Docusate Calcium (STOOL SOFTENER PO) Take 2 capsules by mouth daily.     ergocalciferol (VITAMIN  D2) 1.25 MG (50000 UT) capsule Take 50,000 Units by mouth 2 (two) times a week.     FLUoxetine (PROZAC) 40 MG capsule Take 1 capsule (40 mg total) by mouth daily. 30 capsule 3   Fluticasone-Umeclidin-Vilant (TRELEGY ELLIPTA IN) Inhale into the lungs as needed.      gabapentin (NEURONTIN) 100 MG capsule Take 300 mg by mouth at bedtime.      linaclotide (LINZESS) 145 MCG CAPS capsule Take 1 capsule (145 mcg total) by mouth daily. 90 capsule 1   meloxicam (MOBIC) 15 MG tablet Take 1 tablet (15 mg total) by mouth daily. 30 tablet 1   metoprolol succinate (TOPROL-XL) 100 MG 24 hr tablet Take 1 tablet (100 mg total) by mouth 2 (two) times daily. Take with or immediately following a meal. 180 tablet 3   Multiple Vitamin (MULTI-VITAMIN PO) Take 1 tablet by mouth daily.     nystatin (MYCOSTATIN) powder Apply 1 g topically as needed (under breast).   2   polyethylene glycol powder (MIRALAX) powder Take 17 g by mouth daily. (Patient taking differently: Take 17 g by mouth daily as needed for moderate constipation.) 255 g 0   Simethicone (GAS RELIEF PO) Take 2 capsules by mouth as needed (flatulence).      temazepam (RESTORIL) 30 MG capsule Take 1 capsule (30 mg total) by mouth at bedtime as needed. for sleep 30 capsule 3   traMADol (ULTRAM) 50 MG tablet Take 1 tablet (50 mg total) by mouth every 6 (six) hours as needed. 40 tablet 2   No current facility-administered medications for this visit.      Musculoskeletal: Strength & Muscle Tone: within normal limits Gait & Station: normal Patient leans: N/A  Psychiatric Specialty Exam: Review of Systems  Musculoskeletal:  Positive for arthralgias.  Psychiatric/Behavioral:  The patient is nervous/anxious.   All other systems reviewed and are negative.  There were no vitals taken for this visit.There is no height or weight on file to calculate BMI.  General Appearance: NA  Eye Contact:  NA  Speech:  Clear and Coherent  Volume:  Normal  Mood:  Anxious and Euthymic  Affect:  NA  Thought Process:  Goal Directed  Orientation:  Full (Time, Place, and Person)  Thought Content: Rumination   Suicidal Thoughts:  No  Homicidal Thoughts:  No  Memory:  Immediate;   Good Recent;   Good Remote;   NA  Judgement:  Good  Insight:  Fair  Psychomotor Activity:  Decreased  Concentration:  Concentration: Good and Attention Span: Good  Recall:  Good  Fund of Knowledge: Good  Language: Good  Akathisia:  No  Handed:  Right  AIMS (if indicated): not done  Assets:  Communication Skills Desire for Improvement Resilience Social Support  ADL's:  Intact  Cognition: WNL  Sleep:  Fair   Screenings: PHQ2-9    Flowsheet Row Video Visit from 08/27/2020 in [place] [facility] from [facility]/18/2020 [facility]  PHQ-2 Total Score 1 0      Flowsheet Row Video Visit from 08/27/2020 in [facility] No Risk        Assessment and Plan: This patient is a 70 year old female with a history of depression and anxiety.  She continues to do well on her current regimen.  She will continue Prozac 40 mg daily for depression, Xanax 1 mg 4 times daily for anxiety and Restoril 30 mg at bedtime as  needed for sleep.  She will return to see me in 4 months   [name], MD 08/27/2020, 1:15 PM

## 2020-08-29 ENCOUNTER — Other Ambulatory Visit: Payer: Self-pay

## 2020-08-29 ENCOUNTER — Encounter: Payer: Medicare Other | Attending: Surgery | Admitting: Skilled Nursing Facility1

## 2020-08-29 DIAGNOSIS — K219 Gastro-esophageal reflux disease without esophagitis: Secondary | ICD-10-CM | POA: Diagnosis not present

## 2020-08-29 DIAGNOSIS — J449 Chronic obstructive pulmonary disease, unspecified: Secondary | ICD-10-CM | POA: Diagnosis not present

## 2020-08-29 DIAGNOSIS — Z713 Dietary counseling and surveillance: Secondary | ICD-10-CM | POA: Insufficient documentation

## 2020-08-29 DIAGNOSIS — N189 Chronic kidney disease, unspecified: Secondary | ICD-10-CM | POA: Insufficient documentation

## 2020-08-29 DIAGNOSIS — E669 Obesity, unspecified: Secondary | ICD-10-CM
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
## 2020-09-25 ENCOUNTER — Encounter (HOSPITAL_COMMUNITY): Payer: Self-pay | Admitting: Emergency Medicine

## 2020-09-25 ENCOUNTER — Emergency Department (HOSPITAL_COMMUNITY): Payer: Medicare Other

## 2020-09-25 ENCOUNTER — Emergency Department (HOSPITAL_COMMUNITY)
Admission: EM | Admit: 2020-09-25 | Discharge: 2020-09-25 | Disposition: A | Discharge status: Home / Self Care | Payer: Medicare Other | Attending: Emergency Medicine | Admitting: Emergency Medicine

## 2020-09-25 ENCOUNTER — Encounter (INDEPENDENT_AMBULATORY_CARE_PROVIDER_SITE_OTHER): Payer: Self-pay

## 2020-09-25 ENCOUNTER — Other Ambulatory Visit: Payer: Self-pay

## 2020-09-25 DIAGNOSIS — R109 Unspecified abdominal pain: Secondary | ICD-10-CM | POA: Diagnosis present

## 2020-09-25 DIAGNOSIS — K760 Fatty (change of) liver, not elsewhere classified: Secondary | ICD-10-CM | POA: Diagnosis not present

## 2020-09-25 DIAGNOSIS — Z96651 Presence of right artificial knee joint: Secondary | ICD-10-CM | POA: Diagnosis not present

## 2020-09-25 DIAGNOSIS — R161 Splenomegaly, not elsewhere classified: Secondary | ICD-10-CM | POA: Insufficient documentation

## 2020-09-25 DIAGNOSIS — Z8619 Personal history of other infectious and parasitic diseases: Secondary | ICD-10-CM | POA: Insufficient documentation

## 2020-09-25 DIAGNOSIS — Z8719 Personal history of other diseases of the digestive system: Secondary | ICD-10-CM | POA: Insufficient documentation

## 2020-09-25 DIAGNOSIS — J449 Chronic obstructive pulmonary disease, unspecified: Secondary | ICD-10-CM | POA: Insufficient documentation

## 2020-09-25 DIAGNOSIS — I10 Essential (primary) hypertension: Secondary | ICD-10-CM | POA: Diagnosis not present

## 2020-09-25 DIAGNOSIS — R1032 Left lower quadrant pain: Secondary | ICD-10-CM | POA: Diagnosis not present

## 2020-09-25 DIAGNOSIS — Z79899 Other long term (current) drug therapy: Secondary | ICD-10-CM | POA: Diagnosis not present

## 2020-09-25 DIAGNOSIS — Z7982 Long term (current) use of aspirin: Secondary | ICD-10-CM | POA: Insufficient documentation

## 2020-09-25 DIAGNOSIS — R1012 Left upper quadrant pain: Secondary | ICD-10-CM

## 2020-09-25 DIAGNOSIS — Z96612 Presence of left artificial shoulder joint: Secondary | ICD-10-CM | POA: Diagnosis not present

## 2020-09-25 LAB — CBC WITH DIFFERENTIAL/PLATELET
Abs Immature Granulocytes: 0.02 10*3/uL (ref 0.00–0.07)
Basophils Absolute: 0 10*3/uL (ref 0.0–0.1)
Basophils Relative: 1 %
Eosinophils Absolute: 0.2 10*3/uL (ref 0.0–0.5)
Eosinophils Relative: 3 %
HCT: 38.5 % (ref 36.0–46.0)
Hemoglobin: 13.2 g/dL (ref 12.0–15.0)
Immature Granulocytes: 0 %
Lymphocytes Relative: 23 %
Lymphs Abs: 1.3 10*3/uL (ref 0.7–4.0)
MCH: 35 pg — ABNORMAL HIGH (ref 26.0–34.0)
MCHC: 34.3 g/dL (ref 30.0–36.0)
MCV: 102.1 fL — ABNORMAL HIGH (ref 80.0–100.0)
Monocytes Absolute: 0.5 10*3/uL (ref 0.1–1.0)
Monocytes Relative: 9 %
Neutro Abs: 3.6 10*3/uL (ref 1.7–7.7)
Neutrophils Relative %: 64 %
Platelets: 99 10*3/uL — ABNORMAL LOW (ref 150–400)
RBC: 3.77 MIL/uL — ABNORMAL LOW (ref 3.87–5.11)
RDW: 13.8 % (ref 11.5–15.5)
WBC: 5.5 10*3/uL (ref 4.0–10.5)
nRBC: 0 % (ref 0.0–0.2)

## 2020-09-25 LAB — COMPREHENSIVE METABOLIC PANEL
ALT: 47 U/L — ABNORMAL HIGH (ref 0–44)
AST: 65 U/L — ABNORMAL HIGH (ref 15–41)
Albumin: 4 g/dL (ref 3.5–5.0)
Alkaline Phosphatase: 86 U/L (ref 38–126)
Anion gap: 8 (ref 5–15)
BUN: 15 mg/dL (ref 8–23)
CO2: 25 mmol/L (ref 22–32)
Calcium: 9.6 mg/dL (ref 8.9–10.3)
Chloride: 104 mmol/L (ref 98–111)
Creatinine, Ser: 0.58 mg/dL (ref 0.44–1.00)
GFR, Estimated: >60 mL/min (ref >60)
Glucose, Bld: 102 mg/dL — ABNORMAL HIGH (ref 70–99)
Potassium: 3.9 mmol/L (ref 3.5–5.1)
Sodium: 137 mmol/L (ref 135–145)
Total Bilirubin: 0.5 mg/dL (ref 0.3–1.2)
Total Protein: 7.1 g/dL (ref 6.5–8.1)

## 2020-09-25 LAB — LIPASE, BLOOD: Lipase: 23 U/L (ref 11–51)

## 2020-09-25 LAB — URINALYSIS, ROUTINE W REFLEX MICROSCOPIC
Bacteria, UA: NONE SEEN
Bilirubin Urine: NEGATIVE
Glucose, UA: NEGATIVE mg/dL
Hgb urine dipstick: NEGATIVE
Ketones, ur: NEGATIVE mg/dL
Nitrite: NEGATIVE
Protein, ur: NEGATIVE mg/dL
Specific Gravity, Urine: 1.018 (ref 1.005–1.030)
pH: 6 (ref 5.0–8.0)

## 2020-09-25 MED ORDER — OXYCODONE HCL 5 MG PO TABS: 5.0000 mg | ORAL_TABLET | Freq: Four times a day (QID) | ORAL | 0 refills | Status: DC | PRN

## 2020-09-25 MED ORDER — OXYCODONE HCL 5 MG PO TABS
5.0000 mg | ORAL_TABLET | Freq: Once | ORAL | Status: AC
Start: 2020-09-25 — End: 2020-09-25
  Administered 2020-09-25: 5 mg via ORAL
  Filled 2020-09-25: qty 1

## 2020-09-25 MED ORDER — IOHEXOL 300 MG/ML  SOLN
100.0000 mL | Freq: Once | INTRAMUSCULAR | Status: AC | PRN
  Administered 2020-09-25: 100 mL via INTRAVENOUS

## 2020-09-25 NOTE — ED Provider Notes (Signed)
[facility] EMERGENCY DEPARTMENT Provider Note   CSN: [id number] Arrival date & time: 09/25/20  1435     History Chief Complaint  Patient presents with   Abdominal Pain    [name] is a 70 y.o. female.  HPI     70 year old comes in with chief complaint of abdominal pain.  Patient has history of diverticulitis.  She reports that she is having left-sided abdominal pain that started yesterday.  Pain is intensified.  It is constant described as burning pain.  No history of similar pain.  Review of system is negative for any nausea, vomiting, fevers, chills.  She has had diverticulitis long time ago and it required surgical repair, the pain intensity is similar in nature.  Past Medical History:  Diagnosis Date   Anxiety    Arthritis    COPD (chronic obstructive pulmonary disease) ([place])    Depression    Diverticulitis 04/2012   [facility]   Essential hypertension    Frequent PVCs    Outflow tract, positive in the inferior leads   GERD (gastroesophageal reflux disease)    Hepatitis C    Treated   History of pneumonia    Insomnia    Migraine headache    Splenomegaly    Thyroid disease    Wears glasses     Patient Active Problem List   Diagnosis Date Noted   NASH (nonalcoholic steatohepatitis) 06/07/2020   Abnormal liver ultrasound 06/07/2020   Obesity, morbid ([place]) 01/19/2020   Fatty liver 01/19/2020   IBS (irritable bowel syndrome) 10/17/2019   History of hepatitis C 10/17/2019   Status post total shoulder arthroplasty, left 04/22/2018   S/P laparoscopic hernia repair 10/24/2015   Gastroesophageal reflux disease without esophagitis    Cough 02/01/2014   AP (abdominal pain) 02/01/2014   Left-sided chest wall pain 02/01/2014   Chronic cough 11/25/2013   DYSPNEA 11/29/2008   WEIGHT GAIN 10/28/2007   ORAL APHTHAE 05/26/2007   FRACTURE, ANKLE, RIGHT 04/26/2007   KNEE PAIN, RIGHT, CHRONIC 02/25/2007   Chest pain, unspecified 02/25/2007   ARTHRITIS,  GENERALIZED 01/18/2007   HYPOMAGNESEMIA 04/16/2006   DERMATITIS, OTHER ATOPIC 04/16/2006   INSOMNIA 04/16/2006   RENAL INSUFFICIENCY, ACUTE 04/09/2006   TRANSAMINASES, SERUM, ELEVATED 04/09/2006   TOTAL KNEE REPLACEMENT, RIGHT, HX OF 04/09/2006   ANEMIA-NOS 04/08/2006   THROMBOCYTOPENIC PURPURA, 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   ANXIETY 04/08/2006   DEPRESSION 04/08/2006   HYPERTENSION 04/08/2006   GERD 04/08/2006   DEGENERATIVE JOINT DISEASE, KNEE 04/08/2006   OSTEOMYELITIS, ACUTE 03/24/2006    Past Surgical History:  Procedure Laterality Date   ABDOMINAL HYSTERECTOMY     ANKLE SURGERY Right    COLON SURGERY     "part of color removed"   COLONOSCOPY     ESOPHAGEAL MANOMETRY N/A 04/10/2014   Procedure: ESOPHAGEAL MANOMETRY (EM);  Surgeon: Gatha Mayer, MD;  Location: [facility];  Service: Endoscopy;  Laterality: N/A;   ESOPHAGOGASTRODUODENOSCOPY     INCISIONAL HERNIA REPAIR N/A 10/24/2015   Procedure: LAPAROSCOPIC REPAIR INCISIONAL HERNIA WITH MESH;  Surgeon: Georganna Skeans, MD;  Location: [place];  Service: General;  Laterality: N/A;   INSERTION OF MESH N/A 10/24/2015   Procedure: INSERTION OF MESH;  Surgeon: Georganna Skeans, MD;  Location: [place];  Service: General;  Laterality: N/A;   JOINT REPLACEMENT     KNEE SURGERY Right    x4   [place] IMPEDANCE STUDY N/A 04/10/2014   Procedure: [place] IMPEDANCE STUDY;  Surgeon: Gatha Mayer, MD;  Location: [facility];  Service: Endoscopy;  Laterality: N/A;   TOTAL SHOULDER ARTHROPLASTY Left 04/22/2018   Procedure: Left total shoulder arthroplasty;  Surgeon: Justice Britain, MD;  Location: [facility];  Service: Orthopedics;  Laterality: Left;  169mn     OB History   No obstetric history on file.     Family History  Problem Relation Age of Onset   Alcohol abuse Father    Dementia Father    Dementia Maternal Grandmother    Bipolar disorder Daughter    Anxiety disorder Daughter    Depression Brother    Drug abuse Brother    Alcohol abuse Paternal Uncle     Depression Paternal Uncle    ADD / ADHD Neg Hx     Social History   Tobacco Use   Smoking status: Never   Smokeless tobacco: Never  Vaping Use   Vaping Use: Never used  Substance Use Topics   Alcohol use: Yes    Alcohol/week: 3.0 standard drinks    Types: 3 Cans of beer per week    Comment: occasionally   Drug use: No    Home Medications Prior to Admission medications   Medication Sig Start Date End Date Taking? Authorizing Provider  oxyCODONE (OXY IR/ROXICODONE) 5 MG immediate release tablet Take 1 tablet (5 mg total) by mouth every 6 (six) hours as needed for severe pain. 09/25/20  Yes NVarney Biles MD  albuterol (PROVENTIL HFA;VENTOLIN HFA) 108 (90 Base) MCG/ACT inhaler Inhale 2 puffs into the lungs every 6 (six) hours as needed for wheezing or shortness of breath.    [provider]  albuterol (PROVENTIL) (2.5 MG/3ML) 0.083% nebulizer solution Take 2.5 mg by nebulization every 6 (six) hours as needed for wheezing or shortness of breath.    [provider]  ALPRAZolam (Duanne Moron 1 MG tablet Take 1 tablet (1 mg total) by mouth 4 (four) times daily. 08/27/20   RCloria Spring MD  aspirin EC 81 MG tablet Take 81 mg by mouth daily.    [provider]  Cyanocobalamin (VITAMIN B-12 IJ) Inject as directed every 30 (thirty) days. Patient not taking: Reported on 06/07/2020    [provider]  cyclobenzaprine (FLEXERIL) 10 MG tablet Take 10 mg by mouth every 8 (eight) hours as needed. 10/21/19   [provider]  Docusate Calcium (STOOL SOFTENER PO) Take 2 capsules by mouth daily.    [provider]  ergocalciferol (VITAMIN D2) 1.25 MG (50000 UT) capsule Take 50,000 Units by mouth 2 (two) times a week.    [provider]  FLUoxetine (PROZAC) 40 MG capsule Take 1 capsule (40 mg total) by mouth daily. 08/27/20   RCloria Spring MD  Fluticasone-Umeclidin-Vilant (TRELEGY ELLIPTA IN) Inhale into the lungs as needed.     [provider]  gabapentin (NEURONTIN) 100 MG capsule Take 300 mg by mouth at bedtime.     [provider]  linaclotide (LINZESS) 145 MCG CAPS capsule Take 1 capsule (145 mcg total) by mouth daily. 02/13/20   CHarvel Quale MD  meloxicam (MOBIC) 15 MG tablet Take 1 tablet (15 mg total) by mouth daily. 04/23/18   Shuford, TOlivia Mackie PA-C  metoprolol succinate (TOPROL-XL) 100 MG 24 hr tablet Take 1 tablet (100 mg total) by mouth 2 (two) times daily. Take with or immediately following a meal. 12/26/19 03/25/20  MSatira Sark MD  Multiple Vitamin (MULTI-VITAMIN PO) Take 1 tablet by mouth daily.    [provider]  nystatin (  MYCOSTATIN) powder Apply 1 g topically as needed (under breast).  03/16/14   [provider]  polyethylene glycol powder (MIRALAX) powder Take 17 g by mouth daily. Patient taking differently: Take 17 g by mouth daily as needed for moderate constipation. 06/20/14   Gatha Mayer, MD  Simethicone (GAS RELIEF PO) Take 2 capsules by mouth as needed (flatulence).     [provider]  temazepam (RESTORIL) 30 MG capsule Take 1 capsule (30 mg total) by mouth at bedtime as needed. for sleep 08/27/20   Cloria Spring, MD    Allergies    Levothyroxine, Tylenol [acetaminophen], and Nabumetone  Review of Systems   Review of Systems  Constitutional:  Positive for activity change.  Gastrointestinal:  Positive for abdominal pain.  All other systems reviewed and are negative.  Physical Exam Updated Vital Signs BP (!) 160/107   Pulse 71   Temp 97.9 F (36.6 C) (Oral)   Resp 18   Ht 5' 3"  (1.6 m)   Wt 98 kg   SpO2 96%   BMI 38.26 kg/m   Physical Exam Vitals and nursing note reviewed.  Constitutional:      Appearance: She is well-developed.  HENT:     Head: Atraumatic.  Cardiovascular:     Rate and Rhythm: Normal rate.  Pulmonary:     Effort: Pulmonary effort is normal.  Abdominal:     Tenderness: There is abdominal tenderness in  the left upper quadrant and left lower quadrant. There is guarding. There is no rebound.  Musculoskeletal:     Cervical back: Normal range of motion and neck supple.  Skin:    General: Skin is warm and dry.  Neurological:     Mental Status: She is alert and oriented to person, place, and time.    ED Results / Procedures / Treatments   Labs (all labs ordered are listed, but only abnormal results are displayed) Labs Reviewed  COMPREHENSIVE METABOLIC PANEL - Abnormal; Notable for the following components:      Result Value   Glucose, Bld 102 (*)    AST 65 (*)    ALT 47 (*)    All other components within normal limits  CBC WITH DIFFERENTIAL/PLATELET - Abnormal; Notable for the following components:   RBC 3.77 (*)    MCV 102.1 (*)    MCH 35.0 (*)    Platelets 99 (*)    All other components within normal limits  URINALYSIS, ROUTINE W REFLEX MICROSCOPIC - Abnormal; Notable for the following components:   Leukocytes,Ua TRACE (*)    All other components within normal limits  LIPASE, BLOOD    EKG None  Radiology CT ABDOMEN PELVIS W CONTRAST  Result Date: 09/25/2020 CLINICAL DATA:  Two days of left lower quadrant abdominal pain, diverticulitis suspected. EXAM: CT ABDOMEN AND PELVIS WITH CONTRAST TECHNIQUE: Multidetector CT imaging of the abdomen and pelvis was performed using the standard protocol following bolus administration of intravenous contrast. CONTRAST:  14m OMNIPAQUE IOHEXOL 300 MG/ML  SOLN COMPARISON:  Ultrasound June 15, 2020 and CT November 03, 2015 FINDINGS: Lower chest: No acute abnormality. Hepatobiliary: Hepatic steatosis with cirrhotic hepatic morphology. No suspicious hepatic lesions visualized. Patent portal, splenic and superior mesenteric veins. Gallbladder is unremarkable. No biliary ductal dilation. Pancreas: Within normal limits. Spleen: Splenomegaly measuring 16.7 cm in maximum craniocaudal dimension. Adrenals/Urinary Tract: Bilateral adrenal glands are  unremarkable. No hydronephrosis. Subcentimeter hypodense lesion lower pole of the right kidney, too small to accurately characterize but statistically likely  represent cysts. No solid enhancing renal masses. Urinary bladder is within normal limits for degree of distension. Stomach/Bowel: Stomach is decompressed limiting evaluation. Normal positioning of the duodenum/ligament of [place]. No pathologic dilation of small bowel. The appendix and terminal ileum are grossly unremarkable. Sigmoid colon anastomotic sutures. A few scattered left-sided colonic diverticula without findings of acute diverticulitis. Vascular/Lymphatic: Aortic and branch vessel atherosclerosis without aneurysmal dilation. No pathologically enlarged abdominal or pelvic lymph nodes. Reproductive: Status post hysterectomy. No adnexal masses. Other: No pneumoperitoneum.  No abdominopelvic ascites. Musculoskeletal: Multilevel degenerative change of the spine. No acute osseous abnormality. IMPRESSION: 1. No acute findings in the abdomen or pelvis. 2. Mild left-sided colonic diverticulosis without evidence of acute diverticulitis. 3. Hepatic steatosis with cirrhotic hepatic morphology and sequela of portal hypertension including splenomegaly. No ascites. 4.  Aortic Atherosclerosis (ICD10-I70.0). Electronically Signed   By: Dahlia Bailiff MD   On: 09/25/2020 19:07    Procedures Procedures   Medications Ordered in ED Medications  iohexol (OMNIPAQUE) 300 MG/ML solution 100 mL (100 mLs Intravenous Contrast Given 09/25/20 1753)  oxyCODONE (Oxy IR/ROXICODONE) immediate release tablet 5 mg (5 mg Oral Given 09/25/20 2003)    ED Course  I have reviewed the triage vital signs and the nursing notes.  Pertinent labs & imaging results that were available during my care of the patient were reviewed by me and considered in my medical decision making (see chart for details).    MDM Rules/Calculators/A&P                           70 year old female  comes in with chief complaint of left-sided abdominal pain.  She is supposed to follow-up with GI doctor later this week, but the pain was severe and she was advised to come to the ER.  She has known history of hepatitis.  She also has history of diverticulitis that has required surgical resection.  On exam patient is noted to have left upper quadrant pain and left lower quadrant pain.  The pain in the upper quadrant is worse.  On exam, I did not appreciate splenomegaly and there was no flank tenderness.  CT scan was ordered for this atypical pain, and it revealed evidence of splenomegaly.  Suspicion is high that her pain is result of splenomegaly.  I discussed with the patient that the CAT scan is showing concerns for splenomegaly that is likely secondary to her Karlene Lineman, and that the liver is showing some cirrhotic changes.  For now, we will have to focus on symptom management.  Patient has a GI follow-up coming up this week, so we will proceed with her following up with the GI team for further management of the symptoms.  Final Clinical Impression(s) / ED Diagnoses Final diagnoses:  Splenomegaly  Left upper quadrant abdominal pain    Rx / DC Orders ED Discharge Orders          Ordered    oxyCODONE (OXY IR/ROXICODONE) 5 MG immediate release tablet  Every 6 hours PRN        09/25/20 2032             Varney Biles, MD 09/26/20 2350

## 2020-09-25 NOTE — ED Provider Notes (Signed)
Emergency Medicine Provider Triage Evaluation Note  [name] , a 70 y.o. female with past medical history of hepatitis C, COPD, hypertension, and GERD was evaluated in triage.  Pt complains of 2-day history of left mid to lower abdominal pain.  She describes the pain as constant and burning in quality.  Pain is nonradiating.  Abdominal pain is been associated with mild nausea and decreased appetite.  No vomiting, fever, chills, or diarrhea.  Had normal bowel movement this morning.  No flank pain or dysuria.  No chest pain or shortness of breath.  Review of Systems  Positive: Abdominal pain Negative: Fever, chills, dysuria, vomiting or diarrhea  Physical Exam  BP (!) 136/107 (BP Location: Right Arm)   Pulse 72   Temp 97.9 F (36.6 C) (Oral)   Resp 18   Ht 5' 3"  (1.6 m)   Wt 98 kg   SpO2 98%   BMI 38.26 kg/m  Gen:   Awake, no distress   Resp:  Normal effort  MSK:   Moves extremities without difficulty  Other:  Left mid to lower abdominal tenderness, abdomen soft.  No rebound  Medical Decision Making  Medically screening exam initiated at 3:36 PM.  Appropriate orders placed.  Briscoe Deutscher was informed that the remainder of the evaluation will be completed by another provider, this initial triage assessment does not replace that evaluation, and the importance of remaining in the ED until their evaluation is complete.  Patient here with left-sided abdominal pain x2 days.  Has history of diverticulitis "years ago" no fever or nausea vomiting.  No chest pain or shortness of breath.  Patient will need further evaluation in the emergency department.  She is agreeable to plan   Kem Parkinson, PA-C 09/25/20 1541    Varney Biles, MD 09/25/20 1643

## 2020-09-25 NOTE — ED Triage Notes (Signed)
Pt c/o burning abd pain on the right side. Pt states the area is swollen. Pt states hx of diverticulitis.

## 2020-09-25 NOTE — Discharge Instructions (Addendum)
You have enlarged spleen, which is likely causing pain.  Return to the ER if you start having fevers, chills, severe abdominal pain that is worsening.  Avoid trauma to the left side, as this pain can rupture and cause severe bleeding.  Discontinue tramadol, take oxycodone instead for severe pain.  Follow-up with a GI doctor as planned, please advise them to review your CAT scan with you.  If they do not think your enlarged spleen is because of liver, then you will need to see your primary care doctor for further testing.

## 2020-09-27 ENCOUNTER — Ambulatory Visit (INDEPENDENT_AMBULATORY_CARE_PROVIDER_SITE_OTHER): Payer: Medicare Other | Admitting: Gastroenterology

## 2020-09-27 ENCOUNTER — Other Ambulatory Visit: Payer: Self-pay

## 2020-09-27 ENCOUNTER — Other Ambulatory Visit (INDEPENDENT_AMBULATORY_CARE_PROVIDER_SITE_OTHER): Payer: Self-pay

## 2020-09-27 ENCOUNTER — Encounter (INDEPENDENT_AMBULATORY_CARE_PROVIDER_SITE_OTHER): Payer: Self-pay

## 2020-09-27 ENCOUNTER — Encounter (INDEPENDENT_AMBULATORY_CARE_PROVIDER_SITE_OTHER): Payer: Self-pay | Admitting: Gastroenterology

## 2020-09-27 VITALS — BP 133/84 | HR 72 | Temp 97.8°F | Ht 64.0 in | Wt 214.6 lb

## 2020-09-27 DIAGNOSIS — K581 Irritable bowel syndrome with constipation: Secondary | ICD-10-CM

## 2020-09-27 DIAGNOSIS — K7400 Hepatic fibrosis, unspecified: Secondary | ICD-10-CM | POA: Insufficient documentation

## 2020-09-27 DIAGNOSIS — K7460 Unspecified cirrhosis of liver: Secondary | ICD-10-CM | POA: Insufficient documentation

## 2020-09-27 DIAGNOSIS — K7469 Other cirrhosis of liver: Secondary | ICD-10-CM | POA: Diagnosis not present

## 2020-09-27 DIAGNOSIS — R1010 Upper abdominal pain, unspecified: Secondary | ICD-10-CM

## 2020-09-27 DIAGNOSIS — K7581 Nonalcoholic steatohepatitis (NASH): Secondary | ICD-10-CM | POA: Diagnosis not present

## 2020-09-27 DIAGNOSIS — Z8619 Personal history of other infectious and parasitic diseases: Secondary | ICD-10-CM | POA: Diagnosis not present

## 2020-09-27 DIAGNOSIS — Z6836 Body mass index (BMI) 36.0-36.9, adult: Secondary | ICD-10-CM

## 2020-09-27 MED ORDER — HYOSCYAMINE SULFATE 0.125 MG PO TABS: 0.1250 mg | ORAL_TABLET | Freq: Three times a day (TID) | ORAL | 2 refills | Status: DC | PRN

## 2020-09-27 NOTE — Patient Instructions (Addendum)
Perform blood workup Take Levsin as needed for abdominal pain Increase Linzess to 290 mcg (2 tablets) every day Schedule EGD Referral to obesity medicine specialist Follow up with nutritionist Try to avoid taking oxycodone Can take Tylenol max of 2 g per day (650 mg q8h) for pain Avoid NSAIDs for pain Avoid eating raw oysters or shellfish Ensure every night before going to sleep

## 2020-09-27 NOTE — H&P (View-Only) (Signed)
vsiDaniel Jenetta Downer, M.D. Gastroenterology & Hepatology [facility] [address] [place], [place] [address]  Primary Care Physician: Glenda Chroman, MD [place] [address]  I will communicate my assessment and recommendations to the referring MD via EMR.  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 IBS-C 4. Drug-induced constipation  History of Present Illness: [name] is a 70 y.o. 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  The patient was last seen on 05/18/2020. At that time, the patient had a liver elastography performed which was normal.  She was continued on Linzess 145 mcg every day.Had blood testing performed which showed normal IgG, INR, CBC with platelets 130 with rest of cell lines within normal limits, AFP 8.1, negative ASMA, AMA and normal iron stores with undetectable HCVRNA.  The patient comes today to the clinic as she has presented since last Saturday worsening new onset of abdominal burning pain in her LUQ. The pain occaisonallyt radiates to her back. She reports that the pain has been constant and significant, slightly better than before but not controlled yet. She has presented some nausea and dry heaves.The patient reports that she takes Linzess 145 mcg qday, has a BM at least every other day. No melena or hematochezia.  Due to the symptoms, the patient decided to go to the hospital on 09/25/2020.  She had blood testing at that time that showed a CMP with mild ovation of the AST 65, ALT was 47, lipase was normal 23, total bili  was normal 0.5, alkaline phosphatase 86, normal electrolytes and renal function, CBC showed thrombocytopenia with platelets of 99, the WBC was 5.5 and hemoglobin was 13.2.  CT of the abdomen and pelvis with IV contrast showed presence of mild left-sided diverticulosis, there was presence of steatosis and cirrhotic morphology of the liver with presence of worsening splenomegaly with 16 cm in size.the patient was discharged home and she was prescribed oxycodone, has only taken a few pills and has avoided oxycodone.  The patient denies having any fever, chills, hematochezia, melena, hematemesis, abdominal distention, diarrhea, jaundice, pruritus. Has lost a couple of lb since last appointment.  Last EGD: 2016 - hiatal hernia Last Colonoscopy: 2013 - diverticulosis with presence of narrowing due to prior diverticulitis  Past Medical History: Past Medical History:  Diagnosis Date   Anxiety    Arthritis    COPD (chronic obstructive pulmonary disease) ([place])    Depression    Diverticulitis 04/2012   [facility]   Essential hypertension    Frequent PVCs    Outflow tract, positive in the inferior leads   GERD (gastroesophageal reflux disease)    Hepatitis C    Treated   History of pneumonia    Insomnia    Migraine headache    Splenomegaly    Thyroid disease    Wears glasses     Past Surgical History: Past Surgical History:  Procedure Laterality Date   ABDOMINAL HYSTERECTOMY     ANKLE SURGERY Right    COLON SURGERY     "part of color removed"   COLONOSCOPY     ESOPHAGEAL MANOMETRY N/A 04/10/2014   Procedure: ESOPHAGEAL  MANOMETRY (EM);  Surgeon: Gatha Mayer, MD;  Location: [facility];  Service: Endoscopy;  Laterality: N/A;   ESOPHAGOGASTRODUODENOSCOPY     INCISIONAL HERNIA REPAIR N/A 10/24/2015   Procedure: LAPAROSCOPIC REPAIR INCISIONAL HERNIA WITH MESH;  Surgeon: Georganna Skeans, MD;  Location: [place];  Service: General;  Laterality: N/A;   INSERTION OF MESH N/A 10/24/2015    Procedure: INSERTION OF MESH;  Surgeon: Georganna Skeans, MD;  Location: [place];  Service: General;  Laterality: N/A;   JOINT REPLACEMENT     KNEE SURGERY Right    x4   [place] IMPEDANCE STUDY N/A 04/10/2014   Procedure: [place] IMPEDANCE STUDY;  Surgeon: Gatha Mayer, MD;  [facility];  Service: Endoscopy;  Laterality: N/A;   TOTAL SHOULDER ARTHROPLASTY Left 04/22/2018   Procedure: Left total shoulder arthroplasty;  Surgeon: Justice Britain, MD;  Location: [facility];  Service: Orthopedics;  Laterality: Left;  142mn    Family History: Family History  Problem Relation Age of Onset   Alcohol abuse Father    Dementia Father    Dementia Maternal Grandmother    Bipolar disorder Daughter    Anxiety disorder Daughter    Depression Brother    Drug abuse Brother    Alcohol abuse Paternal Uncle    Depression Paternal Uncle    ADD / ADHD Neg Hx     Social History: Social History   Tobacco Use  Smoking Status Never  Smokeless Tobacco Never   Social History   Substance and Sexual Activity  Alcohol Use Yes   Alcohol/week: 3.0 standard drinks   Types: 3 Cans of beer per week   Comment: occasionally   Social History   Substance and Sexual Activity  Drug Use No    Allergies: Allergies  Allergen Reactions   Levothyroxine Hives and Itching   Tylenol [Acetaminophen] Other (See Comments)    Liver function per MD    Nabumetone Rash    Medications: Current Outpatient Medications  Medication Sig Dispense Refill   albuterol (PROVENTIL HFA;VENTOLIN HFA) 108 (90 Base) MCG/ACT inhaler Inhale 2 puffs into the lungs every 6 (six) hours as needed for wheezing or shortness of breath.     albuterol (PROVENTIL) (2.5 MG/3ML) 0.083% nebulizer solution Take 2.5 mg by nebulization every 6 (six) hours as needed for wheezing or shortness of breath.     ALPRAZolam (XANAX) 1 MG tablet Take 1 tablet (1 mg total) by mouth 4 (four) times daily. 120 tablet 3   aspirin EC 81 MG tablet Take 81 mg by mouth  daily.     cyclobenzaprine (FLEXERIL) 10 MG tablet Take 10 mg by mouth every 8 (eight) hours as needed.     Docusate Calcium (STOOL SOFTENER PO) Take 2 capsules by mouth daily.     ergocalciferol (VITAMIN D2) 1.25 MG (50000 UT) capsule Take 50,000 Units by mouth 2 (two) times a week.     FLUoxetine (PROZAC) 40 MG capsule Take 1 capsule (40 mg total) by mouth daily. 30 capsule 3   Fluticasone-Umeclidin-Vilant (TRELEGY ELLIPTA IN) Inhale into the lungs as needed.      gabapentin (NEURONTIN) 100 MG capsule Take 300 mg by mouth at bedtime.      linaclotide (LINZESS) 145 MCG CAPS capsule Take 1 capsule (145 mcg total) by mouth daily. 90 capsule 1   meloxicam (MOBIC) 15 MG tablet Take 1 tablet (15 mg total) by mouth daily. 30 tablet 1   metoprolol succinate (TOPROL-XL) 100 MG 24 hr tablet Take 1 tablet (  100 mg total) by mouth 2 (two) times daily. Take with or immediately following a meal. 180 tablet 3   Multiple Vitamin (MULTI-VITAMIN PO) Take 1 tablet by mouth daily.     nystatin (MYCOSTATIN) powder Apply 1 g topically as needed (under breast).   2   oxyCODONE (OXY IR/ROXICODONE) 5 MG immediate release tablet Take 1 tablet (5 mg total) by mouth every 6 (six) hours as needed for severe pain. 10 tablet 0   polyethylene glycol powder (MIRALAX) powder Take 17 g by mouth daily. (Patient taking differently: Take 17 g by mouth daily as needed for moderate constipation.) 255 g 0   Simethicone (GAS RELIEF PO) Take 2 capsules by mouth as needed (flatulence).      temazepam (RESTORIL) 30 MG capsule Take 1 capsule (30 mg total) by mouth at bedtime as needed. for sleep 30 capsule 3   No current facility-administered medications for this visit.    Review of Systems: GENERAL: negative for malaise, night sweats HEENT: No changes in hearing or vision, no nose bleeds or other nasal problems. NECK: Negative for lumps, goiter, pain and significant neck swelling RESPIRATORY: Negative for cough,  wheezing CARDIOVASCULAR: Negative for chest pain, leg swelling, palpitations, orthopnea GI: SEE HPI MUSCULOSKELETAL: Negative for joint pain or swelling, back pain, and muscle pain. SKIN: Negative for lesions, rash PSYCH: Negative for sleep disturbance, mood disorder and recent psychosocial stressors. HEMATOLOGY Negative for prolonged bleeding, bruising easily, and swollen nodes. ENDOCRINE: Negative for cold or heat intolerance, polyuria, polydipsia and goiter. NEURO: negative for tremor, gait imbalance, syncope and seizures. The remainder of the review of systems is noncontributory.   Physical Exam: BP 133/84 (BP Location: Right Arm, Patient Position: Sitting, Cuff Size: Large)   Pulse 72   Temp 97.8 F (36.6 C) (Oral)   Ht 5' 4"  (1.626 m)   Wt 214 lb 9.6 oz (97.3 kg)   BMI 36.84 kg/m  GENERAL: The patient is AO x3, in no acute distress. Obese. HEENT: Head is normocephalic and atraumatic. EOMI are intact. Mouth is well hydrated and without lesions. NECK: Supple. No masses LUNGS: Clear to auscultation. No presence of rhonchi/wheezing/rales. Adequate chest expansion HEART: RRR, normal s1 and s2. ABDOMEN: tender to palpation in upper abdomen diffusely, no guarding, no peritoneal signs, and nondistended. BS +. No masses. EXTREMITIES: Without any cyanosis, clubbing, rash, lesions or edema. NEUROLOGIC: AOx3, no focal motor deficit. SKIN: no jaundice, no rashes  Imaging/Labs: as above  I personally reviewed and interpreted the available labs, imaging and endoscopic files.  Impression and Plan: ERLEAN MEALOR is a 70 y.o. 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 Patient  presented new onset left upper quadrant abdominal pain of unclear etiology.  It is unclear to me if this symptoms are related to a primary gastric/duodenal etiologies such as peptic ulcer disease, which will need to be further investigated with an EGD.  Is also possible that her IBS-C has presented a flare, for which she would benefit from increasing her dose of Linzess to 290 mcg every day.  I advised the patient to avoid taking any opiates as these may worsen her current symptoms.  For now, she would benefit from taking Levsin as needed to improve her abdominal pain. On  the other hand, the patient was being evaluated for previous abdominal imaging findings that were concerning for possible cirrhosis, although elastography was not consistent with this.  She has presented worsening splenomegaly which reports portal hypertension.  I considered that she has compensated liver cirrhosis with some subclinical portal hypertension.  I explained this to the patient thoroughly.  We will need to start [facility] screening with AFP testing and will check INR to complete her MELD labs.  The esophagogastroduodenospy will also help [place] evaluate for the presence of esophageal varices as her platelet count is less than 150.  To manage her NASH cirrhosis, she will benefit from losing weight to avoid further worsening of her fibrosis.  She is not longer interested in pursuing bariatric surgery.  I will refer her to obesity specialist but I also encouraged her to follow with the nutrition appointment she has scheduled soon.  The patient understood and agreed.  - INR and AFP testing - Take Levsin as needed for abdominal pain - Increase Linzess to 290 mcg every day - Schedule EGD - Referral to obesity medicine specialist - Follow up with nutritionist - Try to avoid taking oxycodone - Can take Tylenol max of 2 g per day (650 mg q8h) for pain - Avoid NSAIDs for pain - Avoid eating raw oysters or shellfish - Ensure every night before  going to sleep  All questions were answered.      Monique Quale, MD Gastroenterology and Hepatology [facility]

## 2020-09-27 NOTE — Progress Notes (Signed)
vsiDaniel Jenetta Downer, M.D. Gastroenterology & Hepatology [facility] [address] [place], [place] [address]  Primary Care Physician: Glenda Chroman, MD [place] [id number]  I will communicate my assessment and recommendations to the referring MD via EMR.  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 IBS-C 4. Drug-induced constipation  History of Present Illness: [name] is a 70 y.o. 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  The patient was last seen on 05/18/2020. At that time, the patient had a liver elastography performed which was normal.  She was continued on Linzess 145 mcg every day.Had blood testing performed which showed normal IgG, INR, CBC with platelets 130 with rest of cell lines within normal limits, AFP 8.1, negative ASMA, AMA and normal iron stores with undetectable HCVRNA.  The patient comes today to the clinic as she has presented since last Saturday worsening new onset of abdominal burning pain in her LUQ. The pain occaisonallyt radiates to her back. She reports that the pain has been constant and significant, slightly better than before but not controlled yet. She has presented some nausea and dry heaves.The patient reports that she takes Linzess 145 mcg qday, has a BM at least every other day. No melena or hematochezia.  Due to the symptoms, the patient decided to go to the hospital on 09/25/2020.  She had blood testing at that time that showed a CMP with mild ovation of the AST 65, ALT was 47, lipase was normal 23, total bili  was normal 0.5, alkaline phosphatase 86, normal electrolytes and renal function, CBC showed thrombocytopenia with platelets of 99, the WBC was 5.5 and hemoglobin was 13.2.  CT of the abdomen and pelvis with IV contrast showed presence of mild left-sided diverticulosis, there was presence of steatosis and cirrhotic morphology of the liver with presence of worsening splenomegaly with 16 cm in size.the patient was discharged home and she was prescribed oxycodone, has only taken a few pills and has avoided oxycodone.  The patient denies having any fever, chills, hematochezia, melena, hematemesis, abdominal distention, diarrhea, jaundice, pruritus. Has lost a couple of lb since last appointment.  Last EGD: 2016 - hiatal hernia Last Colonoscopy: 2013 - diverticulosis with presence of narrowing due to prior diverticulitis  Past Medical History: Past Medical History:  Diagnosis Date   Anxiety    Arthritis    COPD (chronic obstructive pulmonary disease) ([place])    Depression    Diverticulitis 04/2012   [facility]   Essential hypertension    Frequent PVCs    Outflow tract, positive in the inferior leads   GERD (gastroesophageal reflux disease)    Hepatitis C    Treated   History of pneumonia    Insomnia    Migraine headache    Splenomegaly    Thyroid disease    Wears glasses     Past Surgical History: Past Surgical History:  Procedure Laterality Date   ABDOMINAL HYSTERECTOMY     ANKLE SURGERY Right    COLON SURGERY     "part of color removed"   COLONOSCOPY     ESOPHAGEAL MANOMETRY N/A 04/10/2014   Procedure: ESOPHAGEAL  MANOMETRY (EM);  Surgeon: Gatha Mayer, MD;  [facility];  Service: Endoscopy;  Laterality: N/A;   ESOPHAGOGASTRODUODENOSCOPY     INCISIONAL HERNIA REPAIR N/A 10/24/2015   Procedure: LAPAROSCOPIC REPAIR INCISIONAL HERNIA WITH MESH;  Surgeon: Georganna Skeans, MD;  Location: [place];  Service: General;  Laterality: N/A;   INSERTION OF MESH N/A 10/24/2015    Procedure: INSERTION OF MESH;  Surgeon: Georganna Skeans, MD;  Location: [place];  Service: General;  Laterality: N/A;   JOINT REPLACEMENT     KNEE SURGERY Right    x4   [place] IMPEDANCE STUDY N/A 04/10/2014   Procedure: [place] IMPEDANCE STUDY;  Surgeon: Gatha Mayer, MD;  [facility];  Service: Endoscopy;  Laterality: N/A;   TOTAL SHOULDER ARTHROPLASTY Left 04/22/2018   Procedure: Left total shoulder arthroplasty;  Surgeon: Justice Britain, MD;  [facility];  Service: Orthopedics;  Laterality: Left;  138mn    Family History: Family History  Problem Relation Age of Onset   Alcohol abuse Father    Dementia Father    Dementia Maternal Grandmother    Bipolar disorder Daughter    Anxiety disorder Daughter    Depression Brother    Drug abuse Brother    Alcohol abuse Paternal Uncle    Depression Paternal Uncle    ADD / ADHD Neg Hx     Social History: Social History   Tobacco Use  Smoking Status Never  Smokeless Tobacco Never   Social History   Substance and Sexual Activity  Alcohol Use Yes   Alcohol/week: 3.0 standard drinks   Types: 3 Cans of beer per week   Comment: occasionally   Social History   Substance and Sexual Activity  Drug Use No    Allergies: Allergies  Allergen Reactions   Levothyroxine Hives and Itching   Tylenol [Acetaminophen] Other (See Comments)    Liver function per MD    Nabumetone Rash    Medications: Current Outpatient Medications  Medication Sig Dispense Refill   albuterol (PROVENTIL HFA;VENTOLIN HFA) 108 (90 Base) MCG/ACT inhaler Inhale 2 puffs into the lungs every 6 (six) hours as needed for wheezing or shortness of breath.     albuterol (PROVENTIL) (2.5 MG/3ML) 0.083% nebulizer solution Take 2.5 mg by nebulization every 6 (six) hours as needed for wheezing or shortness of breath.     ALPRAZolam (XANAX) 1 MG tablet Take 1 tablet (1 mg total) by mouth 4 (four) times daily. 120 tablet 3   aspirin EC 81 MG tablet Take 81 mg by mouth  daily.     cyclobenzaprine (FLEXERIL) 10 MG tablet Take 10 mg by mouth every 8 (eight) hours as needed.     Docusate Calcium (STOOL SOFTENER PO) Take 2 capsules by mouth daily.     ergocalciferol (VITAMIN D2) 1.25 MG (50000 UT) capsule Take 50,000 Units by mouth 2 (two) times a week.     FLUoxetine (PROZAC) 40 MG capsule Take 1 capsule (40 mg total) by mouth daily. 30 capsule 3   Fluticasone-Umeclidin-Vilant (TRELEGY ELLIPTA IN) Inhale into the lungs as needed.      gabapentin (NEURONTIN) 100 MG capsule Take 300 mg by mouth at bedtime.      linaclotide (LINZESS) 145 MCG CAPS capsule Take 1 capsule (145 mcg total) by mouth daily. 90 capsule 1   meloxicam (MOBIC) 15 MG tablet Take 1 tablet (15 mg total) by mouth daily. 30 tablet 1   metoprolol succinate (TOPROL-XL) 100 MG 24 hr tablet Take 1 tablet (  100 mg total) by mouth 2 (two) times daily. Take with or immediately following a meal. 180 tablet 3   Multiple Vitamin (MULTI-VITAMIN PO) Take 1 tablet by mouth daily.     nystatin (MYCOSTATIN) powder Apply 1 g topically as needed (under breast).   2   oxyCODONE (OXY IR/ROXICODONE) 5 MG immediate release tablet Take 1 tablet (5 mg total) by mouth every 6 (six) hours as needed for severe pain. 10 tablet 0   polyethylene glycol powder (MIRALAX) powder Take 17 g by mouth daily. (Patient taking differently: Take 17 g by mouth daily as needed for moderate constipation.) 255 g 0   Simethicone (GAS RELIEF PO) Take 2 capsules by mouth as needed (flatulence).      temazepam (RESTORIL) 30 MG capsule Take 1 capsule (30 mg total) by mouth at bedtime as needed. for sleep 30 capsule 3   No current facility-administered medications for this visit.    Review of Systems: GENERAL: negative for malaise, night sweats HEENT: No changes in hearing or vision, no nose bleeds or other nasal problems. NECK: Negative for lumps, goiter, pain and significant neck swelling RESPIRATORY: Negative for cough,  wheezing CARDIOVASCULAR: Negative for chest pain, leg swelling, palpitations, orthopnea GI: SEE HPI MUSCULOSKELETAL: Negative for joint pain or swelling, back pain, and muscle pain. SKIN: Negative for lesions, rash PSYCH: Negative for sleep disturbance, mood disorder and recent psychosocial stressors. HEMATOLOGY Negative for prolonged bleeding, bruising easily, and swollen nodes. ENDOCRINE: Negative for cold or heat intolerance, polyuria, polydipsia and goiter. NEURO: negative for tremor, gait imbalance, syncope and seizures. The remainder of the review of systems is noncontributory.   Physical Exam: BP 133/84 (BP Location: Right Arm, Patient Position: Sitting, Cuff Size: Large)   Pulse 72   Temp 97.8 F (36.6 C) (Oral)   Ht 5' 4"  (1.626 m)   Wt 214 lb 9.6 oz (97.3 kg)   BMI 36.84 kg/m  GENERAL: The patient is AO x3, in no acute distress. Obese. HEENT: Head is normocephalic and atraumatic. EOMI are intact. Mouth is well hydrated and without lesions. NECK: Supple. No masses LUNGS: Clear to auscultation. No presence of rhonchi/wheezing/rales. Adequate chest expansion HEART: RRR, normal s1 and s2. ABDOMEN: tender to palpation in upper abdomen diffusely, no guarding, no peritoneal signs, and nondistended. BS +. No masses. EXTREMITIES: Without any cyanosis, clubbing, rash, lesions or edema. NEUROLOGIC: AOx3, no focal motor deficit. SKIN: no jaundice, no rashes  Imaging/Labs: as above  I personally reviewed and interpreted the available labs, imaging and endoscopic files.  Impression and Plan: [name] is a 70 y.o. 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 Patient  presented new onset left upper quadrant abdominal pain of unclear etiology.  It is unclear to me if this symptoms are related to a primary gastric/duodenal etiologies such as peptic ulcer disease, which will need to be further investigated with an EGD.  Is also possible that her IBS-C has presented a flare, for which she would benefit from increasing her dose of Linzess to 290 mcg every day.  I advised the patient to avoid taking any opiates as these may worsen her current symptoms.  For now, she would benefit from taking Levsin as needed to improve her abdominal pain. On  the other hand, the patient was being evaluated for previous abdominal imaging findings that were concerning for possible cirrhosis, although elastography was not consistent with this.  She has presented worsening splenomegaly which reports portal hypertension.  I considered that she has compensated liver cirrhosis with some subclinical portal hypertension.  I explained this to the patient thoroughly.  We will need to start [facility] screening with AFP testing and will check INR to complete her MELD labs.  The esophagogastroduodenospy will also help [place] evaluate for the presence of esophageal varices as her platelet count is less than 150.  To manage her NASH cirrhosis, she will benefit from losing weight to avoid further worsening of her fibrosis.  She is not longer interested in pursuing bariatric surgery.  I will refer her to obesity specialist but I also encouraged her to follow with the nutrition appointment she has scheduled soon.  The patient understood and agreed.  - INR and AFP testing - Take Levsin as needed for abdominal pain - Increase Linzess to 290 mcg every day - Schedule EGD - Referral to obesity medicine specialist - Follow up with nutritionist - Try to avoid taking oxycodone - Can take Tylenol max of 2 g per day (650 mg q8h) for pain - Avoid NSAIDs for pain - Avoid eating raw oysters or shellfish - Ensure every night before  going to sleep  All questions were answered.      Harvel Quale, MD Gastroenterology and Hepatology [facility]

## 2020-09-28 ENCOUNTER — Encounter (INDEPENDENT_AMBULATORY_CARE_PROVIDER_SITE_OTHER): Payer: Self-pay

## 2020-09-28 LAB — PROTIME-INR
INR: 1
Prothrombin Time: 10.3 s (ref 9.0–11.5)

## 2020-09-28 LAB — AFP TUMOR MARKER: AFP-Tumor Marker: 8 ng/mL — ABNORMAL HIGH

## 2020-09-28 LAB — LIPASE: Lipase: 8 U/L (ref 7–60)

## 2020-09-28 NOTE — Patient Instructions (Signed)
[name]  09/28/2020     @PREFPERIOPPHARMACY @   Your procedure is scheduled on  10/05/2020.   Report to [facility] at  1115  A.M.   Call this number if you have problems the morning of surgery:  [phone]   Remember:  Follow the diet instructions given to you by the office.    Take these medicines the morning of surgery with A SIP OF WATER           Xanax( if needed), flexeril, prozac, mobic (if needed), metoprolol.     Do not wear jewelry, make-up or nail polish.  Do not wear lotions, powders, or perfumes, or deodorant.  Do not shave 48 hours prior to surgery.  Men may shave face and neck.  Do not bring valuables to the hospital.  [facility] is not responsible for any belongings or valuables.  Contacts, dentures or bridgework may not be worn into surgery.  Leave your suitcase in the car.  After surgery it may be brought to your room.  For patients admitted to the hospital, discharge time will be determined by your treatment team.  Patients discharged the day of surgery will not be allowed to drive home and must have someone with you for 24 hours.    Special instructions:      DO NOT smoke tobacco or smoke for 24 hours before your procedure.  Please read over the following fact sheets that you were given. Anesthesia Post-op Instructions and Care and Recovery After Surgery      Upper Endoscopy, Adult, Care After This sheet gives you information about how to care for yourself after your procedure. Your health care provider may also give you more specific instructions. If you have problems or questions, contact your health careprovider. What can I expect after the procedure? After the procedure, it is common to have: A sore throat. Mild stomach pain or discomfort. Bloating. Nausea. Follow these instructions at home:  Follow instructions from your health care provider about what to eat or drink after your procedure. Return to your normal activities as  told by your health care provider. Ask your health care provider what activities are safe for you. Take over-the-counter and prescription medicines only as told by your health care provider. If you were given a sedative during the procedure, it can affect you for several hours. Do not drive or operate machinery until your health care provider says that it is safe. Keep all follow-up visits as told by your health care provider. This is important. Contact a health care provider if you have: A sore throat that lasts longer than one day. Trouble swallowing. Get help right away if: You vomit blood or your vomit looks like coffee grounds. You have: A fever. Bloody, black, or tarry stools. A severe sore throat or you cannot swallow. Difficulty breathing. Severe pain in your chest or abdomen. Summary After the procedure, it is common to have a sore throat, mild stomach discomfort, bloating, and nausea. If you were given a sedative during the procedure, it can affect you for several hours. Do not drive or operate machinery until your health care provider says that it is safe. Follow instructions from your health care provider about what to eat or drink after your procedure. Return to your normal activities as told by your health care provider. This information is not intended to replace advice given to you by your health care provider. Make sure you discuss any  questions you have with your healthcare provider. Document Revised: 03/01/2019 Document Reviewed: 08/03/2017 Elsevier Patient Education  2022 [place] After This sheet gives you information about how to care for yourself after your procedure. Your health care provider may also give you more specific instructions. If you have problems or questions, contact your health careprovider. What can I expect after the procedure? After the procedure, it is common to have: Tiredness. Forgetfulness about what  happened after the procedure. Impaired judgment for important decisions. Nausea or vomiting. Some difficulty with balance. Follow these instructions at home: For the time period you were told by your health care provider:     Rest as needed. Do not participate in activities where you could fall or become injured. Do not drive or use machinery. Do not drink alcohol. Do not take sleeping pills or medicines that cause drowsiness. Do not make important decisions or sign legal documents. Do not take care of children on your own. Eating and drinking Follow the diet that is recommended by your health care provider. Drink enough fluid to keep your urine pale yellow. If you vomit: Drink water, juice, or soup when you can drink without vomiting. Make sure you have little or no nausea before eating solid foods. General instructions Have a responsible adult stay with you for the time you are told. It is important to have someone help care for you until you are awake and alert. Take over-the-counter and prescription medicines only as told by your health care provider. If you have sleep apnea, surgery and certain medicines can increase your risk for breathing problems. Follow instructions from your health care provider about wearing your sleep device: Anytime you are sleeping, including during daytime naps. While taking prescription pain medicines, sleeping medicines, or medicines that make you drowsy. Avoid smoking. Keep all follow-up visits as told by your health care provider. This is important. Contact a health care provider if: You keep feeling nauseous or you keep vomiting. You feel light-headed. You are still sleepy or having trouble with balance after 24 hours. You develop a rash. You have a fever. You have redness or swelling around the IV site. Get help right away if: You have trouble breathing. You have new-onset confusion at home. Summary For several hours after your procedure,  you may feel tired. You may also be forgetful and have poor judgment. Have a responsible adult stay with you for the time you are told. It is important to have someone help care for you until you are awake and alert. Rest as told. Do not drive or operate machinery. Do not drink alcohol or take sleeping pills. Get help right away if you have trouble breathing, or if you suddenly become confused. This information is not intended to replace advice given to you by your health care provider. Make sure you discuss any questions you have with your healthcare provider. Document Revised: 11/17/2019 Document Reviewed: 02/03/2019 Elsevier Patient Education  2022 [employer].

## 2020-10-01 DIAGNOSIS — I10 Essential (primary) hypertension: Secondary | ICD-10-CM | POA: Diagnosis not present

## 2020-10-01 DIAGNOSIS — K7460 Unspecified cirrhosis of liver: Secondary | ICD-10-CM | POA: Diagnosis not present

## 2020-10-01 DIAGNOSIS — Z299 Encounter for prophylactic measures, unspecified: Secondary | ICD-10-CM | POA: Diagnosis not present

## 2020-10-01 DIAGNOSIS — J449 Chronic obstructive pulmonary disease, unspecified: Secondary | ICD-10-CM | POA: Diagnosis not present

## 2020-10-02 ENCOUNTER — Encounter (HOSPITAL_COMMUNITY): Admission: RE | Admit: 2020-10-02 | Payer: Medicare Other | Source: Ambulatory Visit

## 2020-10-03 NOTE — Patient Instructions (Signed)
[name]  10/03/2020     @PREFPERIOPPHARMACY @   Your procedure is scheduled on  10/05/2020.   Report to [facility] at  1115  A.M.   Call this number if you have problems the morning of surgery:  [phone]   Remember:  Follow the diet instructions given to you by the office.    Take these medicines the morning of surgery with A SIP OF WATER                   xanax (if needed), flexeril or mobic or tramadol (if needed), prozac, metoprolol, protonix.  Use your nebulizer and your inhalers before you come and bring your rescue inhaler with you.     Do not wear jewelry, make-up or nail polish.  Do not wear lotions, powders, or perfumes, or deodorant.  Do not shave 48 hours prior to surgery.  Men may shave face and neck.  Do not bring valuables to the hospital.  [facility] is not responsible for any belongings or valuables.  Contacts, dentures or bridgework may not be worn into surgery.  Leave your suitcase in the car.  After surgery it may be brought to your room.  For patients admitted to the hospital, discharge time will be determined by your treatment team.  Patients discharged the day of surgery will not be allowed to drive home and must have someone with them for 24 hours.    Special instructions:     DO NOT smoke tobacco or vape for 24 hours before your procedure.  Please read over the following fact sheets that you were given. Anesthesia Post-op Instructions and Care and Recovery After Surgery      Upper Endoscopy, Adult, Care After This sheet gives you information about how to care for yourself after your procedure. Your health care provider may also give you more specific instructions. If you have problems or questions, contact your health careprovider. What can I expect after the procedure? After the procedure, it is common to have: A sore throat. Mild stomach pain or discomfort. Bloating. Nausea. Follow these instructions at home:  Follow  instructions from your health care provider about what to eat or drink after your procedure. Return to your normal activities as told by your health care provider. Ask your health care provider what activities are safe for you. Take over-the-counter and prescription medicines only as told by your health care provider. If you were given a sedative during the procedure, it can affect you for several hours. Do not drive or operate machinery until your health care provider says that it is safe. Keep all follow-up visits as told by your health care provider. This is important. Contact a health care provider if you have: A sore throat that lasts longer than one day. Trouble swallowing. Get help right away if: You vomit blood or your vomit looks like coffee grounds. You have: A fever. Bloody, black, or tarry stools. A severe sore throat or you cannot swallow. Difficulty breathing. Severe pain in your chest or abdomen. Summary After the procedure, it is common to have a sore throat, mild stomach discomfort, bloating, and nausea. If you were given a sedative during the procedure, it can affect you for several hours. Do not drive or operate machinery until your health care provider says that it is safe. Follow instructions from your health care provider about what to eat or drink after your procedure. Return to your normal activities  as told by your health care provider. This information is not intended to replace advice given to you by your health care provider. Make sure you discuss any questions you have with your healthcare provider. Document Revised: 03/01/2019 Document Reviewed: 08/03/2017 Elsevier Patient Education  2022 [place] After This sheet gives you information about how to care for yourself after your procedure. Your health care provider may also give you more specific instructions. If you have problems or questions, contact your health  careprovider. What can I expect after the procedure? After the procedure, it is common to have: Tiredness. Forgetfulness about what happened after the procedure. Impaired judgment for important decisions. Nausea or vomiting. Some difficulty with balance. Follow these instructions at home: For the time period you were told by your health care provider:     Rest as needed. Do not participate in activities where you could fall or become injured. Do not drive or use machinery. Do not drink alcohol. Do not take sleeping pills or medicines that cause drowsiness. Do not make important decisions or sign legal documents. Do not take care of children on your own. Eating and drinking Follow the diet that is recommended by your health care provider. Drink enough fluid to keep your urine pale yellow. If you vomit: Drink water, juice, or soup when you can drink without vomiting. Make sure you have little or no nausea before eating solid foods. General instructions Have a responsible adult stay with you for the time you are told. It is important to have someone help care for you until you are awake and alert. Take over-the-counter and prescription medicines only as told by your health care provider. If you have sleep apnea, surgery and certain medicines can increase your risk for breathing problems. Follow instructions from your health care provider about wearing your sleep device: Anytime you are sleeping, including during daytime naps. While taking prescription pain medicines, sleeping medicines, or medicines that make you drowsy. Avoid smoking. Keep all follow-up visits as told by your health care provider. This is important. Contact a health care provider if: You keep feeling nauseous or you keep vomiting. You feel light-headed. You are still sleepy or having trouble with balance after 24 hours. You develop a rash. You have a fever. You have redness or swelling around the IV site. Get  help right away if: You have trouble breathing. You have new-onset confusion at home. Summary For several hours after your procedure, you may feel tired. You may also be forgetful and have poor judgment. Have a responsible adult stay with you for the time you are told. It is important to have someone help care for you until you are awake and alert. Rest as told. Do not drive or operate machinery. Do not drink alcohol or take sleeping pills. Get help right away if you have trouble breathing, or if you suddenly become confused. This information is not intended to replace advice given to you by your health care provider. Make sure you discuss any questions you have with your healthcare provider. Document Revised: 11/17/2019 Document Reviewed: 02/03/2019 Elsevier Patient Education  2022 [employer].

## 2020-10-04 ENCOUNTER — Encounter (HOSPITAL_COMMUNITY)
Admission: RE | Admit: 2020-10-04 | Discharge: 2020-10-04 | Disposition: A | Discharge status: Home / Self Care | Payer: Medicare Other | Source: Ambulatory Visit | Attending: Gastroenterology | Admitting: Gastroenterology

## 2020-10-04 ENCOUNTER — Encounter: Payer: Medicare Other | Attending: Surgery | Admitting: Skilled Nursing Facility1

## 2020-10-04 ENCOUNTER — Encounter (HOSPITAL_COMMUNITY): Payer: Self-pay

## 2020-10-04 ENCOUNTER — Other Ambulatory Visit: Payer: Self-pay

## 2020-10-04 DIAGNOSIS — K219 Gastro-esophageal reflux disease without esophagitis: Secondary | ICD-10-CM | POA: Diagnosis not present

## 2020-10-04 DIAGNOSIS — N189 Chronic kidney disease, unspecified: Secondary | ICD-10-CM | POA: Diagnosis not present

## 2020-10-04 DIAGNOSIS — Z713 Dietary counseling and surveillance: Secondary | ICD-10-CM | POA: Insufficient documentation

## 2020-10-04 DIAGNOSIS — Z6837 Body mass index (BMI) 37.0-37.9, adult: Secondary | ICD-10-CM | POA: Diagnosis not present

## 2020-10-04 DIAGNOSIS — Z6836 Body mass index (BMI) 36.0-36.9, adult: Secondary | ICD-10-CM | POA: Insufficient documentation

## 2020-10-04 DIAGNOSIS — K7460 Unspecified cirrhosis of liver: Secondary | ICD-10-CM | POA: Diagnosis not present

## 2020-10-04 DIAGNOSIS — J449 Chronic obstructive pulmonary disease, unspecified: Secondary | ICD-10-CM | POA: Insufficient documentation

## 2020-10-04 DIAGNOSIS — E66812 Obesity, class 2: Secondary | ICD-10-CM

## 2020-10-04 HISTORY — DX: Sleep apnea, unspecified: G47.30

## 2020-10-04 NOTE — Progress Notes (Signed)
Supervised Weight Loss Visit Bariatric Nutrition Education  Referral stated Supervised Weight Loss (SWL) visits needed: 6  3 out of 6 SWL visits   Planned surgery: RYGB Pt expectation of surgery: to lose weight Pt expectation of dietitian: to teach how to control diet and work with me each step of the way; a cheerleader   NUTRITION ASSESSMENT   Anthropometrics  Start weight at [facility]: 216.7 lbs (date: 06/18/2020)  Weight: 213 pounds Height: 63 in BMI: 37.7 kg/m2     Clinical  Medical hx: GERD, COPD, CKD, diverticulosis, liver cirrhosis  Medications: see list; laxative, vitamin D supplement, B12 supplement  Labs:  Notable signs/symptoms: shortness of breath, knee pain Any previous deficiencies? Vitamin D, vitamin b12  Lifestyle & Dietary Hx   Pt states she has been having abdominal pain so she went to he ED and was diagnosed with fatty liver and enlarged spleen. Pt states her abdomen has been hurting since then.  Pt states her family really does not want her to have the bariatric surgery which is putting doubts in her head.  Pt states she is frustrated because she does not have any answers for her health.  Pt states she is taking her linzess 2 times a day which is helping with her bowel movements.  Pt states she sits with a 70 year old women who needs butter.  Pt states she was advised to drink an ensure if she does not want to eat.   Pt states she is really looking forward to her procedure tomorrow and is hoping to get more answers and get a treatment for her liver and spleen.   Estimated daily fluid intake: 64+ oz Supplements: b12 and vitamin D Current average weekly physical activity: ADL's  24-Hr Dietary Recall First Meal: sausage and egg Snack: Second Meal: meatloaf + green beans with butter + mashed potatoes with butter Snack:  Third Meal: ham and tomato sandwich Snack: peanut butter chocolate bar (after having gone to bed) Beverages: 4-5 bottles water + sugar free  flavorings, sugar free green tea watermelon   Estimated Energy Needs Calories: 1600   NUTRITION DIAGNOSIS  Overweight/obesity ([place]-3.3) related to past poor dietary habits and physical inactivity as evidenced by patient w/ planned RYGB surgery following dietary guidelines for continued weight loss.   NUTRITION INTERVENTION  Nutrition counseling (C-1) and education (E-2) to facilitate bariatric surgery goals.  Pre-Op Goals Progress & New Goals -Continue: Make healthy food choices while monitoring portion sizes: eat non starchy vegetables 7 days a Week  -Practice CHEWING your food (aim for applesauce consistency): tunr off tv, sit at the table, put fork down in between each bite continue: Aim for 2 bottles of water plain NEW/continue: start at [employer] 1-2 days a week  NEW: avoid having extra bread or biscuit with meals  NEW: avoid using butter NEW: get the average protein ensure not high protein   Handouts Preeviously Provided Include  Meal Ideas  Learning Style & Readiness for Change Teaching method utilized: Visual & Auditory  Demonstrated degree of understanding via: Teach Back  Readiness Level: Action Barriers to learning/adherence to lifestyle change: none identified   RD's Notes for next Visit  Assess pts adherence to chosen goals   MONITORING & EVALUATION Dietary intake, weekly physical activity, body weight, and pre-op goals in 1 month.   Next Steps  Patient is to return to [facility] in 1 month

## 2020-10-05 ENCOUNTER — Other Ambulatory Visit: Payer: Self-pay

## 2020-10-05 ENCOUNTER — Ambulatory Visit (HOSPITAL_COMMUNITY): Payer: Medicare Other | Admitting: Anesthesiology

## 2020-10-05 ENCOUNTER — Ambulatory Visit (HOSPITAL_COMMUNITY)
Admission: RE | Admit: 2020-10-05 | Discharge: 2020-10-05 | Disposition: A | Discharge status: Home / Self Care | Payer: Medicare Other | Attending: Gastroenterology | Admitting: Gastroenterology

## 2020-10-05 ENCOUNTER — Encounter (HOSPITAL_COMMUNITY): Payer: Self-pay | Admitting: Gastroenterology

## 2020-10-05 ENCOUNTER — Encounter (HOSPITAL_COMMUNITY)
Admission: RE | Disposition: A | Discharge status: Home / Self Care | Payer: Self-pay | Source: Home / Self Care | Attending: Gastroenterology

## 2020-10-05 DIAGNOSIS — R1010 Upper abdominal pain, unspecified: Secondary | ICD-10-CM

## 2020-10-05 DIAGNOSIS — R1012 Left upper quadrant pain: Secondary | ICD-10-CM | POA: Diagnosis not present

## 2020-10-05 DIAGNOSIS — Z8619 Personal history of other infectious and parasitic diseases: Secondary | ICD-10-CM | POA: Insufficient documentation

## 2020-10-05 DIAGNOSIS — J449 Chronic obstructive pulmonary disease, unspecified: Secondary | ICD-10-CM | POA: Diagnosis not present

## 2020-10-05 DIAGNOSIS — R160 Hepatomegaly, not elsewhere classified: Secondary | ICD-10-CM | POA: Diagnosis not present

## 2020-10-05 DIAGNOSIS — K449 Diaphragmatic hernia without obstruction or gangrene: Secondary | ICD-10-CM | POA: Insufficient documentation

## 2020-10-05 DIAGNOSIS — I10 Essential (primary) hypertension: Secondary | ICD-10-CM | POA: Insufficient documentation

## 2020-10-05 DIAGNOSIS — Z7982 Long term (current) use of aspirin: Secondary | ICD-10-CM | POA: Diagnosis not present

## 2020-10-05 DIAGNOSIS — K5909 Other constipation: Secondary | ICD-10-CM | POA: Insufficient documentation

## 2020-10-05 DIAGNOSIS — Z6836 Body mass index (BMI) 36.0-36.9, adult: Secondary | ICD-10-CM | POA: Diagnosis not present

## 2020-10-05 DIAGNOSIS — Z13810 Encounter for screening for upper gastrointestinal disorder: Secondary | ICD-10-CM | POA: Diagnosis not present

## 2020-10-05 DIAGNOSIS — Z79899 Other long term (current) drug therapy: Secondary | ICD-10-CM | POA: Insufficient documentation

## 2020-10-05 DIAGNOSIS — K5730 Diverticulosis of large intestine without perforation or abscess without bleeding: Secondary | ICD-10-CM | POA: Diagnosis not present

## 2020-10-05 DIAGNOSIS — K2270 Barrett's esophagus without dysplasia: Secondary | ICD-10-CM

## 2020-10-05 DIAGNOSIS — K7460 Unspecified cirrhosis of liver: Secondary | ICD-10-CM

## 2020-10-05 DIAGNOSIS — K581 Irritable bowel syndrome with constipation: Secondary | ICD-10-CM

## 2020-10-05 DIAGNOSIS — K219 Gastro-esophageal reflux disease without esophagitis: Secondary | ICD-10-CM | POA: Diagnosis not present

## 2020-10-05 DIAGNOSIS — E669 Obesity, unspecified: Secondary | ICD-10-CM | POA: Insufficient documentation

## 2020-10-05 DIAGNOSIS — K31A0 Gastric intestinal metaplasia, unspecified: Secondary | ICD-10-CM | POA: Insufficient documentation

## 2020-10-05 DIAGNOSIS — Z791 Long term (current) use of non-steroidal anti-inflammatories (NSAID): Secondary | ICD-10-CM | POA: Insufficient documentation

## 2020-10-05 DIAGNOSIS — K7581 Nonalcoholic steatohepatitis (NASH): Secondary | ICD-10-CM | POA: Diagnosis not present

## 2020-10-05 DIAGNOSIS — K2289 Other specified disease of esophagus: Secondary | ICD-10-CM | POA: Diagnosis not present

## 2020-10-05 DIAGNOSIS — Z7951 Long term (current) use of inhaled steroids: Secondary | ICD-10-CM | POA: Insufficient documentation

## 2020-10-05 HISTORY — PX: BIOPSY: SHX5522

## 2020-10-05 HISTORY — PX: ESOPHAGOGASTRODUODENOSCOPY (EGD) WITH PROPOFOL: SHX5813

## 2020-10-05 SURGERY — ESOPHAGOGASTRODUODENOSCOPY (EGD) WITH PROPOFOL
Anesthesia: General

## 2020-10-05 MED ORDER — LINACLOTIDE 145 MCG PO CAPS: 145.0000 ug | ORAL_CAPSULE | Freq: Two times a day (BID) | ORAL | 3 refills | Status: DC

## 2020-10-05 MED ORDER — STERILE WATER FOR IRRIGATION IR SOLN
Status: DC | PRN
  Administered 2020-10-05: 100 mL

## 2020-10-05 MED ORDER — LACTATED RINGERS IV SOLN: INTRAVENOUS | Status: DC

## 2020-10-05 MED ORDER — PROPOFOL 10 MG/ML IV BOLUS
INTRAVENOUS | Status: DC | PRN
  Administered 2020-10-05: 100 mg via INTRAVENOUS
  Administered 2020-10-05: 50 mg via INTRAVENOUS
  Administered 2020-10-05: 100 mg via INTRAVENOUS

## 2020-10-05 NOTE — Anesthesia Preprocedure Evaluation (Addendum)
Anesthesia Evaluation  Patient identified by MRN, date of birth, ID band Patient awake    Reviewed: Allergy & Precautions, NPO status , Patient's Chart, lab work & pertinent test results, reviewed documented beta blocker date and time   History of Anesthesia Complications Negative for: history of anesthetic complications  Airway Mallampati: III  TM Distance: >3 FB Neck ROM: Full    Dental  (+) Dental Advisory Given, Upper Dentures   Pulmonary shortness of breath and with exertion, sleep apnea , COPD,    Pulmonary exam normal breath sounds clear to auscultation       Cardiovascular Exercise Tolerance: Poor hypertension, Pt. on home beta blockers and Pt. on medications + dysrhythmias (frequent PVCs) + Valvular Problems/Murmurs MR  Rhythm:Irregular Rate:Abnormal     Neuro/Psych  Headaches, PSYCHIATRIC DISORDERS Anxiety Depression    GI/Hepatic GERD  Medicated and Poorly Controlled,(+) Cirrhosis       , Hepatitis -, C  Endo/Other  negative endocrine ROS  Renal/GU Renal disease     Musculoskeletal  (+) Arthritis ,   Abdominal   Peds negative pediatric ROS (+)  Hematology  (+) Blood dyscrasia (thrombocytopenia ), anemia ,   Anesthesia Other Findings Results reviewed. LVEF remains normal range at 55 to 60%, also normal RV contraction. Mild to moderate mitral regurgitation with severely dilated left atrium. In light of no change in LVEF with history of PVCs, no further cardiac testing planned in terms of preoperative assessment prior to bariatric surgery.See recent office note.       Reproductive/Obstetrics                           Anesthesia Physical Anesthesia Plan  ASA: 3  Anesthesia Plan: General   Post-op Pain Management:    Induction: Intravenous  PONV Risk Score and Plan: Propofol infusion  Airway Management Planned: Nasal Cannula and Natural Airway  Additional  Equipment:   Intra-op Plan:   Post-operative Plan:   Informed Consent: I have reviewed the patients History and Physical, chart, labs and discussed the procedure including the risks, benefits and alternatives for the proposed anesthesia with the patient or authorized representative who has indicated his/her understanding and acceptance.     Dental advisory given  Plan Discussed with: CRNA and Surgeon  Anesthesia Plan Comments:         Anesthesia Quick Evaluation

## 2020-10-05 NOTE — Interval H&P Note (Signed)
History and Physical Interval Note:  10/05/2020 11:28 AM [name] is a 70 y.o. female with past medical history of hepatitis C status post treatment in remission, anxiety, IBS-C, rheumatoid arthritis, COPD, GERD, opioid-induced constipation hypertension, hypothyroidism, ?SVT,  recurrent diverticulitis s/p partial colectomy and liver cirrhosis secondary to NASH and hep C, who presents for  evaluation of left upper quadrant pain.  The patient states that she has presented persistent pain in her left upper quadrant that goes to the right upper quadrant.  Denies any nausea, vomiting, fever, chills.  States that she has not improved with the use of laxatives.   GENERAL: The patient is AO x3, in no acute distress. Obese  HEENT: Head is normocephalic and atraumatic. EOMI are intact. Mouth is well hydrated and without lesions. NECK: Supple. No masses LUNGS: Clear to auscultation. No presence of rhonchi/wheezing/rales. Adequate chest expansion HEART: RRR, normal s1 and s2. ABDOMEN: Soft, nontender, no guarding, no peritoneal signs, and nondistended. BS +. No masses. EXTREMITIES: Without any cyanosis, clubbing, rash, lesions or edema. NEUROLOGIC: AOx3, no focal motor deficit. SKIN: no jaundice, no rashes   [name]  has presented today for surgery, with the diagnosis of Upper Abdominal Pain Englarged Liver.  The various methods of treatment have been discussed with the patient and family. After consideration of risks, benefits and other options for treatment, the patient has consented to  Procedure(s) with comments: ESOPHAGOGASTRODUODENOSCOPY (EGD) WITH PROPOFOL (N/A) - 1:15 as a surgical intervention.  The patient's history has been reviewed, patient examined, no change in status, stable for surgery.  I have reviewed the patient's chart and labs.  Questions were answered to the patient's satisfaction.     [name]

## 2020-10-05 NOTE — Anesthesia Postprocedure Evaluation (Signed)
Anesthesia Post Note  Patient: [name]  Procedure(s) Performed: ESOPHAGOGASTRODUODENOSCOPY (EGD) WITH PROPOFOL BIOPSY  Patient location during evaluation: Phase II Anesthesia Type: General Level of consciousness: awake and alert and oriented Pain management: pain level controlled Vital Signs Assessment: post-procedure vital signs reviewed and stable Respiratory status: spontaneous breathing and respiratory function stable Cardiovascular status: blood pressure returned to baseline and stable Postop Assessment: no apparent nausea or vomiting Anesthetic complications: no   No notable events documented.   Last Vitals:  Vitals:   10/05/20 1329 10/05/20 1331  BP: (!) 104/37 (!) 114/49  Pulse: 68   Resp: (!) 21   Temp: 36.6 C   SpO2: 97%     Last Pain:  Vitals:   10/05/20 1329  TempSrc: Oral  PainSc: 0-No pain                 Courtny Bennison C Casin Federici

## 2020-10-05 NOTE — Transfer of Care (Signed)
Immediate Anesthesia Transfer of Care Note  Patient: [name]  Procedure(s) Performed: ESOPHAGOGASTRODUODENOSCOPY (EGD) WITH PROPOFOL BIOPSY  Patient Location: Short Stay  Anesthesia Type:General  Level of Consciousness: awake and patient cooperative  Airway & Oxygen Therapy: Patient Spontanous Breathing  Post-op Assessment: Report given to RN and Post -op Vital signs reviewed and stable  Post vital signs: Reviewed and stable  Last Vitals:  Vitals Value Taken Time  BP    Temp    Pulse    Resp    SpO2      Last Pain:  Vitals:   10/05/20 1309  TempSrc:   PainSc: 6       Patients Stated Pain Goal: 8 (16/10/96 0454)  Complications: No notable events documented.

## 2020-10-05 NOTE — Anesthesia Procedure Notes (Signed)
Date/Time: 10/05/2020 1:09 PM Performed by: Vista Deck, CRNA Pre-anesthesia Checklist: Patient identified, Emergency Drugs available, Suction available, Timeout performed and Patient being monitored Patient Re-evaluated:Patient Re-evaluated prior to induction Oxygen Delivery Method: Nasal Cannula

## 2020-10-05 NOTE — Op Note (Addendum)
[facility] Patient Name: [name] Procedure Date: 10/05/2020 1:04 PM MRN: [id number] Date of Birth: 1950-11-29 Attending MD: [name] ,  CSN: [id number] Age: 70 Admit Type: Outpatient Procedure:                Upper GI endoscopy Indications:              Upper abdominal pain, Cirrhosis rule out esophageal                            varices Providers:                [name], Lambert Mody, Janeece Riggers,                            RN, Wynonia Musty Tech, Technician, Kristine L.                            Risa Grill, Technician Referring MD:              Medicines:                Monitored Anesthesia Care Complications:            No immediate complications. Estimated Blood Loss:     Estimated blood loss: none. Procedure:                Pre-Anesthesia Assessment:                           - Prior to the procedure, a History and Physical                            was performed, and patient medications, allergies                            and sensitivities were reviewed. The patient's                            tolerance of previous anesthesia was reviewed.                           - The risks and benefits of the procedure and the                            sedation options and risks were discussed with the                            patient. All questions were answered and informed                            consent was obtained.                           - ASA Grade Assessment: III - A patient with severe                            systemic disease.  After obtaining informed consent, the endoscope was                            passed under direct vision. Throughout the                            procedure, the patient's blood pressure, pulse, and                            oxygen saturations were monitored continuously. The                            GIF-H190 ([id number]) scope was introduced through the                             mouth, and advanced to the second part of duodenum.                            The upper GI endoscopy was accomplished without                            difficulty. The patient tolerated the procedure                            well. Scope In: 1:15:47 PM Scope Out: 1:22:39 PM Total Procedure Duration: 0 hours 6 minutes 52 seconds  Findings:      There were esophageal mucosal changes suspicious for short-segment       Barrett's esophagus, classified as Barrett's stage C0-M1 per [place]       criteria present in the lower third of the esophagus. The maximum       longitudinal extent of these mucosal changes was 1 cm in length. This       was biopsied with a cold forceps for histology.      A 1 cm hiatal hernia was present.      The entire examined stomach was normal. Imaging was performed using       white light and narrow band imaging to visualize the mucosa.      The examined duodenum was normal. Biopsies were taken with a cold       forceps for histology. Impression:               - Esophageal mucosal changes suspicious for                            short-segment Barrett's esophagus, classified as                            Barrett's stage C0-M1 per [place] criteria. Biopsied.                           - 1 cm hiatal hernia.                           - Normal stomach.                           -  Normal examined duodenum. Biopsied. Moderate Sedation:      Per Anesthesia Care Recommendation:           - Discharge patient to home (ambulatory).                           - Start a low FODMAP diet - can ask for a copy at                            the GI clinic                           - Await pathology results.                           - Continue Levsin as needed for abdominal pain                            episodes.                           - Continue pantoprazole 40 mg qday.                           - Repeat upper endoscopy in 2 years for                             surveillance.                           - Schedule gastric emptying study - need to stop                            tramadol 7 days prior to procedure. Procedure Code(s):        --- Professional ---                           [phone], Esophagogastroduodenoscopy, flexible,                            transoral; with biopsy, single or multiple Diagnosis Code(s):        --- Professional ---                           K22.70, Barrett's esophagus without dysplasia                           K44.9, Diaphragmatic hernia without obstruction or                            gangrene                           R10.10, Upper abdominal pain, unspecified                           K74.60, Unspecified cirrhosis of liver CPT copyright 2019 American Medical Association.  All rights reserved. The codes documented in this report are preliminary and upon coder review may  be revised to meet current compliance requirements. [name], MD [name],  10/05/2020 1:29:38 PM This report has been signed electronically. Number of Addenda: 0

## 2020-10-05 NOTE — Discharge Instructions (Addendum)
You are being discharged to home.  We are waiting for your pathology results.  Your physician has recommended a repeat upper endoscopy in two years for surveillance.  Continue pantoprazole 40 mg qday. Schedule gastric emptying study - need to stop tramadol 7 days prior to procedure.

## 2020-10-08 ENCOUNTER — Other Ambulatory Visit (INDEPENDENT_AMBULATORY_CARE_PROVIDER_SITE_OTHER): Payer: Self-pay | Admitting: Gastroenterology

## 2020-10-08 ENCOUNTER — Encounter (INDEPENDENT_AMBULATORY_CARE_PROVIDER_SITE_OTHER): Payer: Self-pay

## 2020-10-08 ENCOUNTER — Telehealth (INDEPENDENT_AMBULATORY_CARE_PROVIDER_SITE_OTHER): Payer: Self-pay | Admitting: *Deleted

## 2020-10-08 DIAGNOSIS — K581 Irritable bowel syndrome with constipation: Secondary | ICD-10-CM | POA: Insufficient documentation

## 2020-10-08 LAB — SURGICAL PATHOLOGY

## 2020-10-08 MED ORDER — LINACLOTIDE 290 MCG PO CAPS: 290.0000 ug | ORAL_CAPSULE | Freq: Every day | ORAL | 3 refills | Status: DC

## 2020-10-08 NOTE — Telephone Encounter (Signed)
Per EGD op note - patient need to be scheduled for gastric emptying study - need to stop tramadol 7 days prior to procedure

## 2020-10-09 ENCOUNTER — Other Ambulatory Visit (INDEPENDENT_AMBULATORY_CARE_PROVIDER_SITE_OTHER): Payer: Self-pay

## 2020-10-09 DIAGNOSIS — R1010 Upper abdominal pain, unspecified: Secondary | ICD-10-CM

## 2020-10-10 ENCOUNTER — Encounter (HOSPITAL_COMMUNITY): Payer: Self-pay | Admitting: Gastroenterology

## 2020-10-14 DIAGNOSIS — J449 Chronic obstructive pulmonary disease, unspecified: Secondary | ICD-10-CM | POA: Diagnosis not present

## 2020-10-14 DIAGNOSIS — I10 Essential (primary) hypertension: Secondary | ICD-10-CM | POA: Diagnosis not present

## 2020-10-16 ENCOUNTER — Encounter (HOSPITAL_COMMUNITY)
Admission: RE | Admit: 2020-10-16 | Discharge: 2020-10-16 | Disposition: A | Discharge status: Home / Self Care | Payer: Medicare Other | Source: Ambulatory Visit | Attending: Gastroenterology | Admitting: Gastroenterology

## 2020-10-16 ENCOUNTER — Other Ambulatory Visit: Payer: Self-pay

## 2020-10-16 DIAGNOSIS — R109 Unspecified abdominal pain: Secondary | ICD-10-CM | POA: Diagnosis not present

## 2020-10-16 DIAGNOSIS — R1010 Upper abdominal pain, unspecified: Secondary | ICD-10-CM | POA: Diagnosis not present

## 2020-10-16 DIAGNOSIS — R140 Abdominal distension (gaseous): Secondary | ICD-10-CM | POA: Diagnosis not present

## 2020-10-16 MED ORDER — TECHNETIUM TC 99M SULFUR COLLOID
2.0000 | Freq: Once | INTRAVENOUS | Status: AC | PRN
  Administered 2020-10-16: 2.2 via ORAL

## 2020-10-17 ENCOUNTER — Other Ambulatory Visit (INDEPENDENT_AMBULATORY_CARE_PROVIDER_SITE_OTHER): Payer: Self-pay | Admitting: Gastroenterology

## 2020-10-17 DIAGNOSIS — R109 Unspecified abdominal pain: Secondary | ICD-10-CM

## 2020-10-17 MED ORDER — AMITRIPTYLINE HCL 10 MG PO TABS: 10.0000 mg | ORAL_TABLET | Freq: Every day | ORAL | 3 refills | Status: DC

## 2020-10-17 NOTE — Progress Notes (Signed)
Patient stopped taking tramadol

## 2020-10-22 ENCOUNTER — Telehealth (HOSPITAL_COMMUNITY): Payer: Medicare Other | Admitting: Psychiatry

## 2020-10-30 ENCOUNTER — Other Ambulatory Visit (INDEPENDENT_AMBULATORY_CARE_PROVIDER_SITE_OTHER): Payer: Self-pay

## 2020-10-30 DIAGNOSIS — K7469 Other cirrhosis of liver: Secondary | ICD-10-CM

## 2020-11-01 ENCOUNTER — Other Ambulatory Visit (INDEPENDENT_AMBULATORY_CARE_PROVIDER_SITE_OTHER): Payer: Self-pay

## 2020-11-01 ENCOUNTER — Encounter (INDEPENDENT_AMBULATORY_CARE_PROVIDER_SITE_OTHER): Payer: Self-pay

## 2020-11-01 ENCOUNTER — Telehealth (INDEPENDENT_AMBULATORY_CARE_PROVIDER_SITE_OTHER): Payer: Self-pay

## 2020-11-01 DIAGNOSIS — R1010 Upper abdominal pain, unspecified: Secondary | ICD-10-CM

## 2020-11-01 MED ORDER — PEG 3350-KCL-NA BICARB-NACL 420 G PO SOLR: 4000.0000 mL | ORAL | 0 refills | Status: DC

## 2020-11-01 NOTE — Telephone Encounter (Signed)
LeighAnn Jackalynn Art, CMA  

## 2020-11-05 ENCOUNTER — Other Ambulatory Visit: Payer: Self-pay | Admitting: Radiology

## 2020-11-06 ENCOUNTER — Other Ambulatory Visit: Payer: Self-pay

## 2020-11-06 ENCOUNTER — Encounter (HOSPITAL_COMMUNITY): Payer: Self-pay

## 2020-11-06 ENCOUNTER — Encounter: Payer: Medicare Other | Attending: Surgery | Admitting: Skilled Nursing Facility1

## 2020-11-06 ENCOUNTER — Ambulatory Visit (HOSPITAL_COMMUNITY)
Admission: RE | Admit: 2020-11-06 | Discharge: 2020-11-06 | Disposition: A | Discharge status: Home / Self Care | Payer: Medicare Other | Source: Ambulatory Visit | Attending: Gastroenterology | Admitting: Gastroenterology

## 2020-11-06 DIAGNOSIS — K7469 Other cirrhosis of liver: Secondary | ICD-10-CM | POA: Diagnosis not present

## 2020-11-06 DIAGNOSIS — Z6836 Body mass index (BMI) 36.0-36.9, adult: Secondary | ICD-10-CM

## 2020-11-06 DIAGNOSIS — Z7982 Long term (current) use of aspirin: Secondary | ICD-10-CM | POA: Diagnosis not present

## 2020-11-06 DIAGNOSIS — Z6837 Body mass index (BMI) 37.0-37.9, adult: Secondary | ICD-10-CM | POA: Diagnosis not present

## 2020-11-06 DIAGNOSIS — Z8619 Personal history of other infectious and parasitic diseases: Secondary | ICD-10-CM | POA: Diagnosis not present

## 2020-11-06 DIAGNOSIS — K7460 Unspecified cirrhosis of liver: Secondary | ICD-10-CM | POA: Insufficient documentation

## 2020-11-06 DIAGNOSIS — N189 Chronic kidney disease, unspecified: Secondary | ICD-10-CM

## 2020-11-06 DIAGNOSIS — R7989 Other specified abnormal findings of blood chemistry: Secondary | ICD-10-CM | POA: Diagnosis not present

## 2020-11-06 DIAGNOSIS — Z713 Dietary counseling and surveillance: Secondary | ICD-10-CM | POA: Diagnosis not present

## 2020-11-06 DIAGNOSIS — I10 Essential (primary) hypertension: Secondary | ICD-10-CM | POA: Insufficient documentation

## 2020-11-06 DIAGNOSIS — J449 Chronic obstructive pulmonary disease, unspecified: Secondary | ICD-10-CM | POA: Diagnosis not present

## 2020-11-06 DIAGNOSIS — K219 Gastro-esophageal reflux disease without esophagitis: Secondary | ICD-10-CM | POA: Insufficient documentation

## 2020-11-06 DIAGNOSIS — Z7951 Long term (current) use of inhaled steroids: Secondary | ICD-10-CM | POA: Diagnosis not present

## 2020-11-06 DIAGNOSIS — Z79899 Other long term (current) drug therapy: Secondary | ICD-10-CM | POA: Insufficient documentation

## 2020-11-06 DIAGNOSIS — K7581 Nonalcoholic steatohepatitis (NASH): Secondary | ICD-10-CM | POA: Insufficient documentation

## 2020-11-06 DIAGNOSIS — K5790 Diverticulosis of intestine, part unspecified, without perforation or abscess without bleeding: Secondary | ICD-10-CM | POA: Insufficient documentation

## 2020-11-06 DIAGNOSIS — K7402 Hepatic fibrosis, advanced fibrosis: Secondary | ICD-10-CM | POA: Diagnosis not present

## 2020-11-06 LAB — CBC
HCT: 34.6 % — ABNORMAL LOW (ref 36.0–46.0)
Hemoglobin: 12.1 g/dL (ref 12.0–15.0)
MCH: 34.8 pg — ABNORMAL HIGH (ref 26.0–34.0)
MCHC: 35 g/dL (ref 30.0–36.0)
MCV: 99.4 fL (ref 80.0–100.0)
Platelets: 75 10*3/uL — ABNORMAL LOW (ref 150–400)
RBC: 3.48 MIL/uL — ABNORMAL LOW (ref 3.87–5.11)
RDW: 13.5 % (ref 11.5–15.5)
WBC: 4.1 10*3/uL (ref 4.0–10.5)
nRBC: 0 % (ref 0.0–0.2)

## 2020-11-06 LAB — PROTIME-INR
INR: 1.2 (ref 0.8–1.2)
Prothrombin Time: 15.3 seconds — ABNORMAL HIGH (ref 11.4–15.2)

## 2020-11-06 MED ORDER — FENTANYL CITRATE (PF) 100 MCG/2ML IJ SOLN
INTRAMUSCULAR | Status: AC | PRN
  Administered 2020-11-06: 50 ug via INTRAVENOUS
  Administered 2020-11-06: 25 ug via INTRAVENOUS

## 2020-11-06 MED ORDER — LIDOCAINE HCL (PF) 1 % IJ SOLN
INTRAMUSCULAR | Status: AC
  Filled 2020-11-06: qty 30

## 2020-11-06 MED ORDER — FENTANYL CITRATE (PF) 100 MCG/2ML IJ SOLN
INTRAMUSCULAR | Status: AC
  Filled 2020-11-06: qty 2

## 2020-11-06 MED ORDER — MIDAZOLAM HCL 2 MG/2ML IJ SOLN
INTRAMUSCULAR | Status: AC | PRN
  Administered 2020-11-06: 1 mg via INTRAVENOUS
  Administered 2020-11-06: 0.5 mg via INTRAVENOUS

## 2020-11-06 MED ORDER — GELATIN ABSORBABLE 12-7 MM EX MISC
CUTANEOUS | Status: AC
  Filled 2020-11-06: qty 1

## 2020-11-06 MED ORDER — MIDAZOLAM HCL 2 MG/2ML IJ SOLN
INTRAMUSCULAR | Status: AC
  Filled 2020-11-06: qty 2

## 2020-11-06 MED ORDER — SODIUM CHLORIDE 0.9 % IV SOLN: INTRAVENOUS | Status: DC

## 2020-11-06 NOTE — H&P (Signed)
Chief Complaint: Patient was seen in consultation today for liver core biopsy at the request of Harvel Quale  Referring Physician(s): Harvel Quale  Supervising Physician: Jacqulynn Cadet  Patient Status: [facility] - Out-pt  History of Present Illness: [name] is a 70 y.o. female   Hx Hep C NASH Cirrhosis IBS; COPD; GERD; HTN; diverticulitis  Abd pain and constipation for months Elevated LFTs  CT 09/25/20: IMPRESSION: 1. No acute findings in the abdomen or pelvis. 2. Mild left-sided colonic diverticulosis without evidence of acute diverticulitis. 3. Hepatic steatosis with cirrhotic hepatic morphology and sequela of portal hypertension including splenomegaly. No ascites.  Scheduled for liver core biopsy per Dr Jenetta Downer  Past Medical History:  Diagnosis Date   Anxiety    Arthritis    COPD (chronic obstructive pulmonary disease) ([place])    Depression    Diverticulitis 04/2012   [facility]   Essential hypertension    Frequent PVCs    Outflow tract, positive in the inferior leads   GERD (gastroesophageal reflux disease)    Hepatitis C    Treated   History of pneumonia    Insomnia    Migraine headache    Sleep apnea    Splenomegaly    Thyroid disease    Wears glasses     Past Surgical History:  Procedure Laterality Date   ABDOMINAL HYSTERECTOMY     ANKLE SURGERY Right    BIOPSY  10/05/2020   Procedure: BIOPSY;  Surgeon: Harvel Quale, MD;  Location: [facility];  Service: Gastroenterology;;   COLON SURGERY     "part of color removed"   COLONOSCOPY     ESOPHAGEAL MANOMETRY N/A 04/10/2014   Procedure: ESOPHAGEAL MANOMETRY (EM);  Surgeon: Gatha Mayer, MD;  Location: [facility];  Service: Endoscopy;  Laterality: N/A;   ESOPHAGOGASTRODUODENOSCOPY     ESOPHAGOGASTRODUODENOSCOPY (EGD) WITH PROPOFOL N/A 10/05/2020   Procedure: ESOPHAGOGASTRODUODENOSCOPY (EGD) WITH PROPOFOL;  Surgeon: Harvel Quale, MD;  Location: [facility];  Service: Gastroenterology;  Laterality: N/A;  1:15   INCISIONAL HERNIA REPAIR N/A 10/24/2015   Procedure: LAPAROSCOPIC REPAIR INCISIONAL HERNIA WITH MESH;  Surgeon: Georganna Skeans, MD;  Location: [place];  Service: General;  Laterality: N/A;   INSERTION OF MESH N/A 10/24/2015   Procedure: INSERTION OF MESH;  Surgeon: Georganna Skeans, MD;  Location: [place];  Service: General;  Laterality: N/A;   JOINT REPLACEMENT     KNEE SURGERY Right    x4   [place] IMPEDANCE STUDY N/A 04/10/2014   Procedure: [place] IMPEDANCE STUDY;  Surgeon: Gatha Mayer, MD;  Location: [facility];  Service: Endoscopy;  Laterality: N/A;   TOTAL SHOULDER ARTHROPLASTY Left 04/22/2018   Procedure: Left total shoulder arthroplasty;  Surgeon: Justice Britain, MD;  Location: [facility];  Service: Orthopedics;  Laterality: Left;  121mn    Allergies: Levothyroxine, Tylenol [acetaminophen], and Nabumetone  Medications: Prior to Admission medications   Medication Sig Start Date End Date Taking? Authorizing Provider  albuterol (PROVENTIL HFA;VENTOLIN HFA) 108 (90 Base) MCG/ACT inhaler Inhale 2 puffs into the lungs every 6 (six) hours as needed for wheezing or shortness of breath.   Yes [provider]  ALPRAZolam (Duanne Moron 1 MG tablet Take 1 tablet (1 mg total) by mouth 4 (four) times daily. 08/27/20  Yes RCloria Spring MD  amitriptyline (ELAVIL) 10 MG tablet Take 1 tablet (10 mg total) by mouth at bedtime. 10/17/20  Yes CHarvel Quale MD  Ascorbic Acid (VITAMIN C) 1000  MG tablet Take 1,000 mg by mouth daily.   Yes [provider]  cyanocobalamin 1000 MCG tablet Take 2,000 mcg by mouth daily.   Yes [provider]  cyclobenzaprine (FLEXERIL) 10 MG tablet Take 10 mg by mouth every 8 (eight) hours as needed for muscle spasms. 10/21/19  Yes [provider]  cycloSPORINE (RESTASIS) 0.05 % ophthalmic emulsion Place 1 drop into both eyes 2 (two) times daily as needed (dry eyes).    Yes [provider]  ELDERBERRY PO Take 1 each by mouth daily.   Yes [provider]  ergocalciferol (VITAMIN D2) 1.25 MG (50000 UT) capsule Take 50,000 Units by mouth 2 (two) times a week.   Yes [provider]  FLUoxetine (PROZAC) 40 MG capsule Take 1 capsule (40 mg total) by mouth daily. 08/27/20  Yes Cloria Spring, MD  Fluticasone-Umeclidin-Vilant 100-62.5-25 MCG/INH AEPB Inhale 1 puff into the lungs daily as needed (asthma).   Yes [provider]  gabapentin (NEURONTIN) 100 MG capsule Take 300 mg by mouth at bedtime.    Yes [provider]  hyoscyamine (LEVSIN) 0.125 MG tablet Take 1 tablet (0.125 mg total) by mouth every 8 (eight) hours as needed (abdominal pain). 09/27/20  Yes Harvel Quale, MD  linaclotide [facility]) 290 MCG CAPS capsule Take 1 capsule (290 mcg total) by mouth daily before breakfast. 10/08/20  Yes Montez Morita, Quillian Quince, MD  meloxicam (MOBIC) 15 MG tablet Take 1 tablet (15 mg total) by mouth daily. Patient taking differently: Take 15 mg by mouth at bedtime. 04/23/18  Yes Shuford, Olivia Mackie, PA-C  metoprolol succinate (TOPROL-XL) 100 MG 24 hr tablet Take 100 mg by mouth in the morning and at bedtime. Take with or immediately following a meal.   Yes [provider]  Multiple Vitamin (MULTI-VITAMIN PO) Take 1 tablet by mouth daily.   Yes [provider]  nystatin (MYCOSTATIN) powder Apply 1 g topically daily. 03/16/14  Yes [provider]  pantoprazole (PROTONIX) 40 MG tablet Take 40 mg by mouth daily. 09/11/20  Yes [provider]  polyethylene glycol-electrolytes (TRILYTE) 420 g solution Take 4,000 mLs by mouth as directed. 11/01/20  Yes Harvel Quale, MD  rosuvastatin (CRESTOR) 10 MG tablet Take 10 mg by mouth daily.   Yes [provider]  Simethicone (GAS RELIEF PO) Take 2 capsules by mouth as needed (flatulence).    Yes [provider]  temazepam (RESTORIL)  30 MG capsule Take 1 capsule (30 mg total) by mouth at bedtime as needed. for sleep Patient taking differently: Take 30 mg by mouth at bedtime. for sleep 08/27/20  Yes Cloria Spring, MD  traMADol (ULTRAM) 50 MG tablet Take 50 mg by mouth 3 (three) times daily as needed for severe pain.   Yes [provider]  albuterol (PROVENTIL) (2.5 MG/3ML) 0.083% nebulizer solution Take 2.5 mg by nebulization every 6 (six) hours as needed for wheezing or shortness of breath.    [provider]  aspirin EC 81 MG tablet Take 81 mg by mouth daily.    [provider]  Omega-3 Fatty Acids (FISH OIL) 1200 MG CAPS Take 2,400 mg by mouth daily.    [provider]     Family History  Problem Relation Age of Onset   Alcohol abuse Father    Dementia Father    Dementia Maternal Grandmother    Bipolar disorder Daughter    Anxiety disorder Daughter    Depression Brother    Drug  abuse Brother    Alcohol abuse Paternal Uncle    Depression Paternal Uncle    ADD / ADHD Neg Hx     Social History   Socioeconomic History   Marital status: Widowed    Spouse name: Not on file   Number of children: 2   Years of education: 12th grade   Highest education level: Not on file  Occupational History   Occupation: RETIRED  Tobacco Use   Smoking status: Never   Smokeless tobacco: Never  Vaping Use   Vaping Use: Never used  Substance and Sexual Activity   Alcohol use: Yes    Alcohol/week: 3.0 standard drinks    Types: 3 Cans of beer per week    Comment: occasionally   Drug use: No   Sexual activity: Never  Other Topics Concern   Not on file  Social History Narrative   Not on file   Social Determinants of Health   Financial Resource Strain: Not on file  Food Insecurity: Not on file  Transportation Needs: Not on file  Physical Activity: Not on file  Stress: Not on file  Social Connections: Not on file     Review of Systems: A 12 point ROS discussed and pertinent  positives are indicated in the HPI above.  All other systems are negative.  Review of Systems  Constitutional:  Negative for activity change, fatigue, fever and unexpected weight change.  Respiratory:  Negative for cough and shortness of breath.   Gastrointestinal:  Positive for abdominal pain, constipation and nausea.  Skin:  Negative for color change.  Psychiatric/Behavioral:  Negative for behavioral problems and confusion.    Vital Signs: BP (!) 164/101   Pulse 77   Temp 98.8 F (37.1 C) (Oral)   Ht 5' 3"  (1.6 m)   Wt 214 lb (97.1 kg)   SpO2 98%   BMI 37.91 kg/m   Physical Exam Vitals reviewed.  HENT:     Mouth/Throat:     Mouth: Mucous membranes are moist.  Eyes:     Extraocular Movements: Extraocular movements intact.  Cardiovascular:     Rate and Rhythm: Normal rate and regular rhythm.     Heart sounds: Normal heart sounds.  Pulmonary:     Effort: Pulmonary effort is normal.     Breath sounds: Normal breath sounds.  Abdominal:     Palpations: Abdomen is soft.     Tenderness: There is no abdominal tenderness.  Musculoskeletal:        General: Normal range of motion.  Skin:    General: Skin is warm.  Neurological:     Mental Status: She is alert and oriented to person, place, and time.  Psychiatric:        Behavior: Behavior normal.    Imaging: NM Gastric Emptying  Result Date: 10/17/2020 CLINICAL DATA:  Bloating, gas and abdominal pain x6 months, concern for gastroparesis EXAM: NUCLEAR MEDICINE GASTRIC EMPTYING SCAN TECHNIQUE: After oral ingestion of radiolabeled meal, sequential abdominal images were obtained for 4 hours. Percentage of activity emptying the stomach was calculated at 1 hour, 2 hour, 3 hour, and 4 hours. RADIOPHARMACEUTICALS:  2.2 mCi Tc-25msulfur colloid in standardized meal COMPARISON:  CT September 25, 2020 FINDINGS: Expected location of the stomach in the left upper quadrant. Ingested meal empties the stomach gradually over the course of the study.  50% emptied at 1 hr ( normal >= 10%) 69% emptied at 2 hr ( normal >= 40%) 89% emptied at 3 hr (  normal >= 70%) 98% emptied at 4 hr ( normal >= 90%) IMPRESSION: Normal gastric emptying study. Electronically Signed   By: Dahlia Bailiff MD   On: 10/17/2020 09:42    Labs:  CBC: Recent Labs    06/07/20 1631 09/25/20 1551  WBC 6.6 5.5  HGB 14.1 13.2  HCT 41.3 38.5  PLT 130* 99*    COAGS: Recent Labs    06/07/20 1631 09/27/20 1051  INR 1.0 1.0    BMP: Recent Labs    01/19/20 1129 09/25/20 1551  NA 139 137  K 4.1 3.9  CL 104 104  CO2 25 25  GLUCOSE 113 102*  BUN 17 15  CALCIUM 10.0 9.6  CREATININE 0.75 0.58  GFRNONAA  --  >60    LIVER FUNCTION TESTS: Recent Labs    01/19/20 1129 09/25/20 1551  BILITOT 0.6 0.5  AST 31 65*  ALT 28 47*  ALKPHOS  --  86  PROT 6.7 7.1  ALBUMIN  --  4.0    TUMOR MARKERS: Recent Labs    06/07/20 1631 09/27/20 1051  AFPTM 8.1* 8.0*    Assessment and Plan:  NASH Cirrhosis Hep C Hx Elevation in LFTs Scheduled for liver core biopsy Risks and benefits of liver core biopsy was discussed with the patient and/or patient's family including, but not limited to bleeding, infection, damage to adjacent structures or low yield requiring additional tests.  All of the questions were answered and there is agreement to proceed. Consent signed and in chart.    Thank you for this interesting consult.  I greatly enjoyed meeting [name] and look forward to participating in their care.  A copy of this report was sent to the requesting provider on this date.  Electronically Signed: Lavonia Drafts, PA-C 11/06/2020, 11:56 AM   I spent a total of  30 Minutes   in face to face in clinical consultation, greater than 50% of which was counseling/coordinating care for liver core biopsy

## 2020-11-06 NOTE — Sedation Documentation (Signed)
Patient is resting comfortably. VSS °

## 2020-11-06 NOTE — Sedation Documentation (Signed)
Patient is resting comfortably. Tolerated procedure well.  Totals Time: 10 mins Fentanyl 62mg Versed 1.541m

## 2020-11-06 NOTE — Procedures (Signed)
Interventional Radiology Procedure Note  Procedure: US guided random liver biopsy  Complications: None  Estimated Blood Loss: None  Recommendations: - Bedrest x 2 hrs - DC Home   Signed,  Criselda Peaches, MD

## 2020-11-06 NOTE — Sedation Documentation (Signed)
Awaiting orders to transfer pt to ss

## 2020-11-06 NOTE — Patient Instructions (Signed)
   Your procedure is scheduled on: 11/13/2020  Report to [facility] at    8:50 AM.  Call this number if you have problems the morning of surgery: [phone]   Remember:              Follow Directions on the letter you received from Your Physician's office regarding the Bowel Prep              No Smoking the day of Procedure :   Take these medicines the morning of surgery with A SIP OF WATER: xanax, Elavil, Prozac, metoprolol, pantoprazole    Gabapentin and tramadol if needed   Do not wear jewelry, make-up or nail polish.    Do not bring valuables to the hospital.  Contacts, dentures or bridgework may not be worn into surgery.  .   Patients discharged the day of surgery will not be allowed to drive home.     Colonoscopy, Adult, Care After This sheet gives you information about how to care for yourself after your procedure. Your health care provider may also give you more specific instructions. If you have problems or questions, contact your health care provider. What can I expect after the procedure? After the procedure, it is common to have: A small amount of blood in your stool for 24 hours after the procedure. Some gas. Mild abdominal cramping or bloating.  Follow these instructions at home: General instructions  For the first 24 hours after the procedure: Do not drive or use machinery. Do not sign important documents. Do not drink alcohol. Do your regular daily activities at a slower pace than normal. Eat soft, easy-to-digest foods. Rest often. Take over-the-counter or prescription medicines only as told by your health care provider. It is up to you to get the results of your procedure. Ask your health care provider, or the department performing the procedure, when your results will be ready. Relieving cramping and bloating Try walking around when you have cramps or feel bloated. Apply heat to your abdomen as told by your health care provider. Use a heat source that  your health care provider recommends, such as a moist heat pack or a heating pad. Place a towel between your skin and the heat source. Leave the heat on for 20-30 minutes. Remove the heat if your skin turns bright red. This is especially important if you are unable to feel pain, heat, or cold. You may have a greater risk of getting burned. Eating and drinking Drink enough fluid to keep your urine clear or pale yellow. Resume your normal diet as instructed by your health care provider. Avoid heavy or fried foods that are hard to digest. Avoid drinking alcohol for as long as instructed by your health care provider. Contact a health care provider if: You have blood in your stool 2-3 days after the procedure. Get help right away if: You have more than a small spotting of blood in your stool. You pass large blood clots in your stool. Your abdomen is swollen. You have nausea or vomiting. You have a fever. You have increasing abdominal pain that is not relieved with medicine. This information is not intended to replace advice given to you by your health care provider. Make sure you discuss any questions you have with your health care provider. Document Released: 10/16/2003 Document Revised: 11/26/2015 Document Reviewed: 05/15/2015 Elsevier Interactive Patient Education  [employer].

## 2020-11-06 NOTE — Progress Notes (Signed)
Supervised Weight Loss Visit Bariatric Nutrition Education  Referral stated Supervised Weight Loss (SWL) visits needed: 6  4 out of 6 SWL visits   Planned surgery: RYGB Pt expectation of surgery: to lose weight Pt expectation of dietitian: to teach how to control diet and work with me each step of the way; a cheerleader   NUTRITION ASSESSMENT   Anthropometrics  Start weight at [facility]: 216.7 lbs (date: 06/18/2020)  Weight: 213 pounds Height: 63 in BMI: 37.7 kg/m2     Clinical  Medical hx: GERD, COPD, CKD, diverticulosis, liver cirrhosis  Medications: see list; laxative, vitamin D supplement, B12 supplement  Labs:  Notable signs/symptoms: shortness of breath, knee pain Any previous deficiencies? Vitamin D, vitamin b12  Lifestyle & Dietary Hx   (Previous) Pt states she has been having abdominal pain so she went to he ED and was diagnosed with fatty liver and enlarged spleen. Pt states her abdomen has been hurting since then.  Pt states her family really does not want her to have the bariatric surgery which is putting doubts in her head.  Pt states she is frustrated because she does not have any answers for her health.  Pt states she is taking her linzess 2 times a day which is helping with her bowel movements.  Pt states she sits with a 35 year old women who needs butter.  Pt states she was advised to drink an ensure if she does not want to eat.    Pt states she has not been able to be active due to all of the procedures and test for her spleen and liver. Pt states she gets a biopsy today. Pt states due to being worried/nervous her stomach has not been feeling very well.  Pt states she has been feeling bloated and uncomfortable.   Pt states her mind is really not on how she eats or making changes to her diet/lifestyle for fear of a diagnosis of cancer, stating the bariatric surgery is not her priority currently.   Estimated daily fluid intake: 64+ oz Supplements: b12 and vitamin  D Current average weekly physical activity: ADL's  24-Hr Dietary Recall First Meal: sausage and egg Snack: Second Meal: meatloaf + green beans with butter + mashed potatoes with butter Snack:  Third Meal: ham and tomato sandwich Snack: peanut butter chocolate bar (after having gone to bed) Beverages: 4-5 bottles water + sugar free flavorings, sugar free green tea watermelon   Estimated Energy Needs Calories: 1600   NUTRITION DIAGNOSIS  Overweight/obesity ([place]-3.3) related to past poor dietary habits and physical inactivity as evidenced by patient w/ planned RYGB surgery following dietary guidelines for continued weight loss.   NUTRITION INTERVENTION  Nutrition counseling (C-1) and education (E-2) to facilitate bariatric surgery goals.  Pre-Op Goals Progress & New Goals -Continue: Make healthy food choices while monitoring portion sizes: eat non starchy vegetables 7 days a Week  -Practice CHEWING your food (aim for applesauce consistency): tunr off tv, sit at the table, put fork down in between each bite continue: Aim for 2 bottles of water plain continue: start at [employer] 1-2 days a week (unable to accomplish due to procedures) NEW/continue: avoid having extra bread or biscuit with meals  NEW/continue avoid using butter NEW/continue: get the average protein ensure not high protein  NEW: preform stress relief activities   Handouts Preeviously Provided Include  Meal Ideas  Learning Style & Readiness for Change Teaching method utilized: Visual & Auditory  Demonstrated degree of understanding via: Teach  Back  Readiness Level: Action Barriers to learning/adherence to lifestyle change: none identified   RD's Notes for next Visit  Assess pts adherence to chosen goals   MONITORING & EVALUATION Dietary intake, weekly physical activity, body weight, and pre-op goals in 1 month.   Next Steps  Patient is to return to [facility] in 1 month

## 2020-11-06 NOTE — Sedation Documentation (Signed)
Provided pt with drink. Tolerating well. Complains of dull pain at biopsy site. VSS. Report called to [facility] RN Anisha, pt placed in transport

## 2020-11-07 LAB — SURGICAL PATHOLOGY

## 2020-11-08 ENCOUNTER — Encounter (HOSPITAL_COMMUNITY)
Admission: RE | Admit: 2020-11-08 | Discharge: 2020-11-08 | Disposition: A | Discharge status: Home / Self Care | Payer: Medicare Other | Source: Ambulatory Visit | Attending: Gastroenterology | Admitting: Gastroenterology

## 2020-11-08 ENCOUNTER — Encounter (HOSPITAL_COMMUNITY): Payer: Self-pay

## 2020-11-08 ENCOUNTER — Other Ambulatory Visit: Payer: Self-pay

## 2020-11-08 ENCOUNTER — Other Ambulatory Visit: Payer: Self-pay | Admitting: Cardiology

## 2020-11-08 DIAGNOSIS — R1010 Upper abdominal pain, unspecified: Secondary | ICD-10-CM

## 2020-11-13 ENCOUNTER — Encounter (HOSPITAL_COMMUNITY): Payer: Self-pay | Admitting: Gastroenterology

## 2020-11-13 ENCOUNTER — Encounter (HOSPITAL_COMMUNITY)
Admission: RE | Disposition: A | Discharge status: Home / Self Care | Payer: Self-pay | Source: Home / Self Care | Attending: Gastroenterology

## 2020-11-13 ENCOUNTER — Ambulatory Visit (HOSPITAL_COMMUNITY): Payer: Medicare Other | Admitting: Anesthesiology

## 2020-11-13 ENCOUNTER — Ambulatory Visit (HOSPITAL_COMMUNITY)
Admission: RE | Admit: 2020-11-13 | Discharge: 2020-11-13 | Disposition: A | Discharge status: Home / Self Care | Payer: Medicare Other | Attending: Gastroenterology | Admitting: Gastroenterology

## 2020-11-13 DIAGNOSIS — K5520 Angiodysplasia of colon without hemorrhage: Secondary | ICD-10-CM | POA: Diagnosis not present

## 2020-11-13 DIAGNOSIS — I340 Nonrheumatic mitral (valve) insufficiency: Secondary | ICD-10-CM | POA: Diagnosis not present

## 2020-11-13 DIAGNOSIS — K648 Other hemorrhoids: Secondary | ICD-10-CM | POA: Diagnosis not present

## 2020-11-13 DIAGNOSIS — K581 Irritable bowel syndrome with constipation: Secondary | ICD-10-CM | POA: Diagnosis not present

## 2020-11-13 DIAGNOSIS — R1012 Left upper quadrant pain: Secondary | ICD-10-CM | POA: Diagnosis not present

## 2020-11-13 DIAGNOSIS — E039 Hypothyroidism, unspecified: Secondary | ICD-10-CM | POA: Diagnosis not present

## 2020-11-13 DIAGNOSIS — Z7951 Long term (current) use of inhaled steroids: Secondary | ICD-10-CM | POA: Insufficient documentation

## 2020-11-13 DIAGNOSIS — Z7982 Long term (current) use of aspirin: Secondary | ICD-10-CM | POA: Insufficient documentation

## 2020-11-13 DIAGNOSIS — M069 Rheumatoid arthritis, unspecified: Secondary | ICD-10-CM | POA: Diagnosis not present

## 2020-11-13 DIAGNOSIS — K5730 Diverticulosis of large intestine without perforation or abscess without bleeding: Secondary | ICD-10-CM | POA: Insufficient documentation

## 2020-11-13 DIAGNOSIS — D696 Thrombocytopenia, unspecified: Secondary | ICD-10-CM | POA: Insufficient documentation

## 2020-11-13 DIAGNOSIS — J449 Chronic obstructive pulmonary disease, unspecified: Secondary | ICD-10-CM | POA: Diagnosis not present

## 2020-11-13 DIAGNOSIS — K7402 Hepatic fibrosis, advanced fibrosis: Secondary | ICD-10-CM | POA: Diagnosis not present

## 2020-11-13 DIAGNOSIS — K219 Gastro-esophageal reflux disease without esophagitis: Secondary | ICD-10-CM | POA: Insufficient documentation

## 2020-11-13 DIAGNOSIS — Z9049 Acquired absence of other specified parts of digestive tract: Secondary | ICD-10-CM | POA: Diagnosis not present

## 2020-11-13 DIAGNOSIS — Z791 Long term (current) use of non-steroidal anti-inflammatories (NSAID): Secondary | ICD-10-CM | POA: Insufficient documentation

## 2020-11-13 DIAGNOSIS — I10 Essential (primary) hypertension: Secondary | ICD-10-CM | POA: Diagnosis not present

## 2020-11-13 DIAGNOSIS — Z886 Allergy status to analgesic agent status: Secondary | ICD-10-CM | POA: Diagnosis not present

## 2020-11-13 DIAGNOSIS — K644 Residual hemorrhoidal skin tags: Secondary | ICD-10-CM | POA: Insufficient documentation

## 2020-11-13 DIAGNOSIS — Z79899 Other long term (current) drug therapy: Secondary | ICD-10-CM | POA: Insufficient documentation

## 2020-11-13 DIAGNOSIS — K7581 Nonalcoholic steatohepatitis (NASH): Secondary | ICD-10-CM | POA: Diagnosis not present

## 2020-11-13 DIAGNOSIS — Z888 Allergy status to other drugs, medicaments and biological substances status: Secondary | ICD-10-CM | POA: Insufficient documentation

## 2020-11-13 DIAGNOSIS — Z8619 Personal history of other infectious and parasitic diseases: Secondary | ICD-10-CM | POA: Insufficient documentation

## 2020-11-13 DIAGNOSIS — R1010 Upper abdominal pain, unspecified: Secondary | ICD-10-CM

## 2020-11-13 HISTORY — PX: COLONOSCOPY WITH PROPOFOL: SHX5780

## 2020-11-13 LAB — HM COLONOSCOPY

## 2020-11-13 SURGERY — COLONOSCOPY WITH PROPOFOL
Anesthesia: General

## 2020-11-13 MED ORDER — PROPOFOL 500 MG/50ML IV EMUL
INTRAVENOUS | Status: DC | PRN
  Administered 2020-11-13: 150 ug/kg/min via INTRAVENOUS

## 2020-11-13 MED ORDER — PROPOFOL 10 MG/ML IV BOLUS
INTRAVENOUS | Status: AC
  Filled 2020-11-13: qty 40

## 2020-11-13 MED ORDER — PROPOFOL 10 MG/ML IV BOLUS
INTRAVENOUS | Status: DC | PRN
  Administered 2020-11-13 (×3): 50 mg via INTRAVENOUS
  Administered 2020-11-13 (×2): 20 mg via INTRAVENOUS

## 2020-11-13 MED ORDER — LACTATED RINGERS IV SOLN: INTRAVENOUS | Status: DC

## 2020-11-13 NOTE — Discharge Instructions (Addendum)
You are being discharged to home.  Resume your previous diet.  Your physician has recommended a repeat colonoscopy in 10 years for screening purposes.   PATIENT INSTRUCTIONS POST-ANESTHESIA  IMMEDIATELY FOLLOWING SURGERY:  Do not drive or operate machinery for the first twenty four hours after surgery.  Do not make any important decisions for twenty four hours after surgery or while taking narcotic pain medications or sedatives.  If you develop intractable nausea and vomiting or a severe headache please notify your doctor immediately.  FOLLOW-UP:  Please make an appointment with your surgeon as instructed. You do not need to follow up with anesthesia unless specifically instructed to do so.  WOUND CARE INSTRUCTIONS (if applicable):  Keep a dry clean dressing on the anesthesia/puncture wound site if there is drainage.  Once the wound has quit draining you may leave it open to air.  Generally you should leave the bandage intact for twenty four hours unless there is drainage.  If the epidural site drains for more than 36-48 hours please call the anesthesia department.  QUESTIONS?:  Please feel free to call your physician or the hospital operator if you have any questions, and they will be happy to assist you.

## 2020-11-13 NOTE — Transfer of Care (Signed)
Immediate Anesthesia Transfer of Care Note  Patient: [name]  Procedure(s) Performed: COLONOSCOPY WITH PROPOFOL  Patient Location: Short Stay  Anesthesia Type:General  Level of Consciousness: awake  Airway & Oxygen Therapy: Patient Spontanous Breathing  Post-op Assessment: Report given to RN  Post vital signs: Reviewed and stable  Last Vitals:  Vitals Value Taken Time  BP    Temp    Pulse    Resp    SpO2      Last Pain:  Vitals:   11/13/20 0830  TempSrc: Oral  PainSc: 0-No pain      Patients Stated Pain Goal: 5 (97/98/92 1194)  Complications: No notable events documented.

## 2020-11-13 NOTE — H&P (Signed)
[name] is an 70 y.o. female.   Chief Complaint: Abdominal pain HPI: [name] is a 70 y.o. 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  The patient has presented persistent episodes of left lower quadrant abdominal pain.  She has undergone multiple investigations including An EGD on 10/05/2020 which showed was not in her hernia and changes suspicious for short segment Barrett's esophagus (biopsies consistent with Barrett's esophagus).  Stomach and small bowel were within normal limits.  Small bowel biopsies were normal.  She also underwent a CT of the abdomen and pelvis with IV contrast on 09/25/2020 which was only remarkable for diverticulosis.  He had a negative gastric emptying on 10/16/2020  Past Medical History:  Diagnosis Date   Anxiety    Arthritis    COPD (chronic obstructive pulmonary disease) ([place])    Depression    Diverticulitis 04/2012   [facility]   Essential hypertension    Frequent PVCs    Outflow tract, positive in the inferior leads   GERD (gastroesophageal reflux disease)    Hepatitis C    Treated   History of pneumonia    Insomnia    Migraine headache    Sleep apnea    Splenomegaly    Thyroid disease    Wears glasses     Past Surgical History:  Procedure Laterality Date   ABDOMINAL HYSTERECTOMY     ANKLE SURGERY Right    BIOPSY  10/05/2020   Procedure: BIOPSY;  Surgeon: Harvel Quale, MD;  Location: [facility];  Service: Gastroenterology;;   COLON SURGERY     "part of color removed"   COLONOSCOPY     ESOPHAGEAL MANOMETRY N/A 04/10/2014   Procedure: ESOPHAGEAL MANOMETRY (EM);  Surgeon: Gatha Mayer,  MD;  Location: [facility];  Service: Endoscopy;  Laterality: N/A;   ESOPHAGOGASTRODUODENOSCOPY     ESOPHAGOGASTRODUODENOSCOPY (EGD) WITH PROPOFOL N/A 10/05/2020   Procedure: ESOPHAGOGASTRODUODENOSCOPY (EGD) WITH PROPOFOL;  Surgeon: Harvel Quale, MD;  [facility]: [facility];  Service: Gastroenterology;  Laterality: N/A;  1:15   INCISIONAL HERNIA REPAIR N/A 10/24/2015   Procedure: LAPAROSCOPIC REPAIR INCISIONAL HERNIA WITH MESH;  Surgeon: Georganna Skeans, MD;  Location: [place];  Service: General;  Laterality: N/A;   INSERTION OF MESH N/A 10/24/2015   Procedure: INSERTION OF MESH;  Surgeon: Georganna Skeans, MD;  Location: [place];  Service: General;  Laterality: N/A;   JOINT REPLACEMENT     KNEE SURGERY Right    x4   [place] IMPEDANCE STUDY N/A 04/10/2014   Procedure: [place] IMPEDANCE STUDY;  Surgeon: Gatha Mayer, MD;  Location: [facility];  Service: Endoscopy;  Laterality: N/A;   TOTAL SHOULDER ARTHROPLASTY Left 04/22/2018   Procedure: Left total shoulder arthroplasty;  Surgeon: Justice Britain, MD;  Location: [facility];  Service: Orthopedics;  Laterality: Left;  191mn    Family History  Problem Relation Age of Onset   Alcohol abuse Father    Dementia Father    Dementia Maternal Grandmother    Bipolar disorder Daughter    Anxiety disorder Daughter    Depression Brother    Drug abuse Brother    Alcohol abuse Paternal Uncle    Depression Paternal Uncle  ADD / ADHD Neg Hx    Social History:  reports that she has never smoked. She has never used smokeless tobacco. She reports current alcohol use of about 3.0 standard drinks per week. She reports that she does not use drugs.  Allergies:  Allergies  Allergen Reactions   Levothyroxine Hives and Itching   Tylenol [Acetaminophen] Other (See Comments)    Liver function per MD    Nabumetone Rash    Medications Prior to Admission  Medication Sig Dispense Refill   albuterol (PROVENTIL HFA;VENTOLIN HFA) 108 (90 Base) MCG/ACT inhaler Inhale 2  puffs into the lungs every 6 (six) hours as needed for wheezing or shortness of breath.     albuterol (PROVENTIL) (2.5 MG/3ML) 0.083% nebulizer solution Take 2.5 mg by nebulization every 6 (six) hours as needed for wheezing or shortness of breath.     ALPRAZolam (XANAX) 1 MG tablet Take 1 tablet (1 mg total) by mouth 4 (four) times daily. 120 tablet 3   amitriptyline (ELAVIL) 10 MG tablet Take 1 tablet (10 mg total) by mouth at bedtime. 90 tablet 3   Ascorbic Acid (VITAMIN C) 1000 MG tablet Take 1,000 mg by mouth daily.     aspirin EC 81 MG tablet Take 81 mg by mouth daily.     cyanocobalamin 1000 MCG tablet Take 2,000 mcg by mouth daily.     cyclobenzaprine (FLEXERIL) 10 MG tablet Take 10 mg by mouth every 8 (eight) hours as needed for muscle spasms.     cycloSPORINE (RESTASIS) 0.05 % ophthalmic emulsion Place 1 drop into both eyes 2 (two) times daily as needed (dry eyes).     ELDERBERRY PO Take 1 each by mouth daily.     ergocalciferol (VITAMIN D2) 1.25 MG (50000 UT) capsule Take 50,000 Units by mouth 2 (two) times a week.     FLUoxetine (PROZAC) 40 MG capsule Take 1 capsule (40 mg total) by mouth daily. 30 capsule 3   Fluticasone-Umeclidin-Vilant 100-62.5-25 MCG/INH AEPB Inhale 1 puff into the lungs daily as needed (asthma).     gabapentin (NEURONTIN) 100 MG capsule Take 300 mg by mouth at bedtime.      hyoscyamine (LEVSIN) 0.125 MG tablet Take 1 tablet (0.125 mg total) by mouth every 8 (eight) hours as needed (abdominal pain). 30 tablet 2   linaclotide (LINZESS) 290 MCG CAPS capsule Take 1 capsule (290 mcg total) by mouth daily before breakfast. 90 capsule 3   meloxicam (MOBIC) 15 MG tablet Take 1 tablet (15 mg total) by mouth daily. (Patient taking differently: Take 15 mg by mouth at bedtime.) 30 tablet 1   metoprolol succinate (TOPROL-XL) 100 MG 24 hr tablet TAKE ONE TABLET BY MOUTH TWICE DAILY. TAKE WITH OR IMMEDIATELY FOLLOWING A MEAL. (DOSE INCREASE) 180 tablet 1   Multiple Vitamin  (MULTI-VITAMIN PO) Take 1 tablet by mouth daily.     nystatin (MYCOSTATIN) powder Apply 1 g topically daily.  2   Omega-3 Fatty Acids (FISH OIL) 1200 MG CAPS Take 2,400 mg by mouth daily.     pantoprazole (PROTONIX) 40 MG tablet Take 40 mg by mouth daily.     polyethylene glycol-electrolytes (TRILYTE) 420 g solution Take 4,000 mLs by mouth as directed. 4000 mL 0   rosuvastatin (CRESTOR) 10 MG tablet Take 10 mg by mouth daily.     Simethicone (GAS RELIEF PO) Take 2 capsules by mouth as needed (flatulence).      temazepam (RESTORIL) 30 MG capsule Take 1 capsule (30 mg total) by  mouth at bedtime as needed. for sleep (Patient taking differently: Take 30 mg by mouth at bedtime. for sleep) 30 capsule 3   traMADol (ULTRAM) 50 MG tablet Take 50 mg by mouth 3 (three) times daily as needed for severe pain.      No results found for this or any previous visit (from the past 48 hour(s)). No results found.  Review of Systems  Constitutional: Negative.   HENT: Negative.    Eyes: Negative.   Respiratory: Negative.    Cardiovascular: Negative.   Gastrointestinal:  Positive for abdominal pain.  Endocrine: Negative.   Genitourinary: Negative.   Musculoskeletal: Negative.   Skin: Negative.   Allergic/Immunologic: Negative.   Neurological: Negative.   Hematological: Negative.   Psychiatric/Behavioral: Negative.     Blood pressure (!) 142/98, pulse 71, temperature 97.7 F (36.5 C), temperature source Oral, resp. rate 14, SpO2 97 %. Physical Exam  GENERAL: The patient is AO x3, in no acute distress. HEENT: Head is normocephalic and atraumatic. EOMI are intact. Mouth is well hydrated and without lesions. NECK: Supple. No masses LUNGS: Clear to auscultation. No presence of rhonchi/wheezing/rales. Adequate chest expansion HEART: RRR, normal s1 and s2. ABDOMEN: tender in LUQ, no guarding, no peritoneal signs, and nondistended. BS +. No masses. EXTREMITIES: Without any cyanosis, clubbing, rash, lesions  or edema. NEUROLOGIC: AOx3, no focal motor deficit. SKIN: no jaundice, no rashes  Assessment/Plan VALENA IVANOV is a 70 y.o. 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  We will proceed with colonoscopy today.  Harvel Quale, MD 11/13/2020, 8:57 AM

## 2020-11-13 NOTE — Anesthesia Preprocedure Evaluation (Signed)
Anesthesia Evaluation  Patient identified by MRN, date of birth, ID band Patient awake    Reviewed: Allergy & Precautions, NPO status , Patient's Chart, lab work & pertinent test results, reviewed documented beta blocker date and time   History of Anesthesia Complications Negative for: history of anesthetic complications  Airway Mallampati: III  TM Distance: >3 FB Neck ROM: Full    Dental  (+) Dental Advisory Given, Upper Dentures, Missing   Pulmonary shortness of breath and with exertion, sleep apnea , COPD,    Pulmonary exam normal breath sounds clear to auscultation       Cardiovascular Exercise Tolerance: Poor hypertension, Pt. on home beta blockers and Pt. on medications + dysrhythmias (frequent PVCs) + Valvular Problems/Murmurs MR  Rhythm:Irregular Rate:Abnormal     Neuro/Psych  Headaches, PSYCHIATRIC DISORDERS Anxiety Depression    GI/Hepatic GERD  Medicated and Poorly Controlled,(+) Cirrhosis       , Hepatitis -, C  Endo/Other  negative endocrine ROS  Renal/GU Renal disease     Musculoskeletal  (+) Arthritis ,   Abdominal   Peds negative pediatric ROS (+)  Hematology  (+) Blood dyscrasia (thrombocytopenia ), anemia ,   Anesthesia Other Findings Results reviewed. LVEF remains normal range at 55 to 60%, also normal RV contraction. Mild to moderate mitral regurgitation with severely dilated left atrium. In light of no change in LVEF with history of PVCs, no further cardiac testing planned in terms of preoperative assessment prior to bariatric surgery.See recent office note.       Reproductive/Obstetrics                            Anesthesia Physical  Anesthesia Plan  ASA: 3  Anesthesia Plan: General   Post-op Pain Management:    Induction: Intravenous  PONV Risk Score and Plan: Propofol infusion  Airway Management Planned: Nasal Cannula and Natural  Airway  Additional Equipment:   Intra-op Plan:   Post-operative Plan:   Informed Consent: I have reviewed the patients History and Physical, chart, labs and discussed the procedure including the risks, benefits and alternatives for the proposed anesthesia with the patient or authorized representative who has indicated his/her understanding and acceptance.     Dental advisory given  Plan Discussed with: CRNA and Surgeon  Anesthesia Plan Comments:         Anesthesia Quick Evaluation

## 2020-11-13 NOTE — Anesthesia Postprocedure Evaluation (Signed)
Anesthesia Post Note  Patient: AURIEL KIST  Procedure(s) Performed: COLONOSCOPY WITH PROPOFOL  Patient location during evaluation: Short Stay Anesthesia Type: General Level of consciousness: awake and alert Pain management: pain level controlled Vital Signs Assessment: post-procedure vital signs reviewed and stable Respiratory status: spontaneous breathing Cardiovascular status: blood pressure returned to baseline and stable Postop Assessment: no apparent nausea or vomiting Anesthetic complications: no   No notable events documented.   Last Vitals:  Vitals:   11/13/20 0830  BP: (!) 142/98  Pulse: 71  Resp: 14  Temp: 36.5 C  SpO2: 97%    Last Pain:  Vitals:   11/13/20 0830  TempSrc: Oral  PainSc: 0-No pain                 Michiah Masse

## 2020-11-13 NOTE — Op Note (Signed)
[facility] Patient Name: [name] Procedure Date: 11/13/2020 8:48 AM MRN: [id number] Date of Birth: 1951-03-03 Attending MD: [name] ,  CSN: [id number] Age: 70 Admit Type: Outpatient Procedure:                Colonoscopy Indications:              Abdominal pain in the left upper quadrant Providers:                [name], Janeece Riggers, RN, Raphael Gibney,                            Technician Referring MD:              Medicines:                Monitored Anesthesia Care Complications:            No immediate complications. Estimated Blood Loss:     Estimated blood loss: none. Procedure:                Pre-Anesthesia Assessment:                           - Prior to the procedure, a History and Physical                            was performed, and patient medications, allergies                            and sensitivities were reviewed. The patient's                            tolerance of previous anesthesia was reviewed.                           - The risks and benefits of the procedure and the                            sedation options and risks were discussed with the                            patient. All questions were answered and informed                            consent was obtained.                           - ASA Grade Assessment: III - A patient with severe                            systemic disease.                           After obtaining informed consent, the colonoscope                            was passed under direct vision. Throughout the  procedure, the patient's blood pressure, pulse, and                            oxygen saturations were monitored continuously. The                            [id number]-HQ190L ([id number]) scope was introduced through                            the anus and advanced to the the cecum, identified                            by appendiceal orifice and ileocecal valve. The                             colonoscopy was performed without difficulty. The                            patient tolerated the procedure well. The quality                            of the bowel preparation was excellent. Scope In: 9:07:06 AM Scope Out: 9:22:21 AM Scope Withdrawal Time: 0 hours 12 minutes 41 seconds  Total Procedure Duration: 0 hours 15 minutes 15 seconds  Findings:      Hemorrhoids were found on perianal exam.      A single large localized angiodysplastic lesion without bleeding was       found in the proximal ascending colon.      A single medium-sized localized angiodysplastic lesion without bleeding       was found in the descending colon.      A few medium-mouthed diverticula were found in the sigmoid colon.      Non-bleeding external internal hemorrhoids were found during       retroflexion. The hemorrhoids were small. Impression:               - Hemorrhoids found on perianal exam.                           - A single non-bleeding colonic angiodysplastic                            lesion.                           - A single non-bleeding colonic angiodysplastic                            lesion.                           - Diverticulosis in the sigmoid colon.                           - Non-bleeding external internal hemorrhoids.                           -  No specimens collected. Moderate Sedation:      Per Anesthesia Care Recommendation:           - Discharge patient to home (ambulatory).                           - Resume previous diet.                           - Repeat colonoscopy in 10 years for screening                            purposes. Procedure Code(s):        --- Professional ---                           [phone], Colonoscopy, flexible; diagnostic, including                            collection of specimen(s) by brushing or washing,                            when performed (separate procedure) Diagnosis Code(s):        --- Professional ---                            K64.4, Residual hemorrhoidal skin tags                           K64.8, Other hemorrhoids                           K55.20, Angiodysplasia of colon without hemorrhage                           R10.12, Left upper quadrant pain                           K57.30, Diverticulosis of large intestine without                            perforation or abscess without bleeding CPT copyright 2019 [employer]. All rights reserved. The codes documented in this report are preliminary and upon coder review may  be revised to meet current compliance requirements. [name], MD [name],  11/13/2020 9:28:56 AM This report has been signed electronically. Number of Addenda: 0

## 2020-11-14 ENCOUNTER — Encounter (INDEPENDENT_AMBULATORY_CARE_PROVIDER_SITE_OTHER): Payer: Self-pay | Admitting: *Deleted

## 2020-11-14 DIAGNOSIS — I10 Essential (primary) hypertension: Secondary | ICD-10-CM | POA: Diagnosis not present

## 2020-11-14 DIAGNOSIS — J449 Chronic obstructive pulmonary disease, unspecified: Secondary | ICD-10-CM | POA: Diagnosis not present

## 2020-11-21 ENCOUNTER — Encounter (HOSPITAL_COMMUNITY): Payer: Self-pay | Admitting: Gastroenterology

## 2020-11-22 ENCOUNTER — Ambulatory Visit: Payer: Medicare Other | Admitting: Cardiology

## 2020-12-07 DIAGNOSIS — I10 Essential (primary) hypertension: Secondary | ICD-10-CM | POA: Diagnosis not present

## 2020-12-07 DIAGNOSIS — Z299 Encounter for prophylactic measures, unspecified: Secondary | ICD-10-CM | POA: Diagnosis not present

## 2020-12-07 DIAGNOSIS — J449 Chronic obstructive pulmonary disease, unspecified: Secondary | ICD-10-CM | POA: Diagnosis not present

## 2020-12-07 DIAGNOSIS — D849 Immunodeficiency, unspecified: Secondary | ICD-10-CM | POA: Diagnosis not present

## 2020-12-07 DIAGNOSIS — M1990 Unspecified osteoarthritis, unspecified site: Secondary | ICD-10-CM | POA: Diagnosis not present

## 2020-12-11 ENCOUNTER — Other Ambulatory Visit (HOSPITAL_COMMUNITY): Payer: Self-pay | Admitting: Psychiatry

## 2020-12-11 NOTE — Telephone Encounter (Signed)
Call for appt

## 2020-12-12 ENCOUNTER — Ambulatory Visit: Payer: Medicare Other | Admitting: Skilled Nursing Facility1

## 2020-12-12 ENCOUNTER — Encounter: Payer: Medicare Other | Attending: Surgery | Admitting: Skilled Nursing Facility1

## 2020-12-12 DIAGNOSIS — Z6837 Body mass index (BMI) 37.0-37.9, adult: Secondary | ICD-10-CM | POA: Insufficient documentation

## 2020-12-12 DIAGNOSIS — N189 Chronic kidney disease, unspecified: Secondary | ICD-10-CM | POA: Insufficient documentation

## 2020-12-12 DIAGNOSIS — Z713 Dietary counseling and surveillance: Secondary | ICD-10-CM | POA: Insufficient documentation

## 2020-12-12 DIAGNOSIS — J449 Chronic obstructive pulmonary disease, unspecified: Secondary | ICD-10-CM | POA: Insufficient documentation

## 2020-12-12 DIAGNOSIS — K7460 Unspecified cirrhosis of liver: Secondary | ICD-10-CM | POA: Insufficient documentation

## 2020-12-12 DIAGNOSIS — K219 Gastro-esophageal reflux disease without esophagitis: Secondary | ICD-10-CM | POA: Insufficient documentation

## 2020-12-12 DIAGNOSIS — K5790 Diverticulosis of intestine, part unspecified, without perforation or abscess without bleeding: Secondary | ICD-10-CM | POA: Insufficient documentation

## 2020-12-13 ENCOUNTER — Ambulatory Visit: Payer: Medicare Other | Admitting: Skilled Nursing Facility1

## 2020-12-14 ENCOUNTER — Other Ambulatory Visit (HOSPITAL_COMMUNITY): Payer: Self-pay | Admitting: Psychiatry

## 2020-12-17 NOTE — Telephone Encounter (Signed)
Call for appt

## 2020-12-18 ENCOUNTER — Ambulatory Visit (INDEPENDENT_AMBULATORY_CARE_PROVIDER_SITE_OTHER): Payer: Medicare Other | Admitting: Gastroenterology

## 2020-12-20 ENCOUNTER — Encounter: Payer: Medicare Other | Attending: Surgery | Admitting: Skilled Nursing Facility1

## 2020-12-20 ENCOUNTER — Other Ambulatory Visit: Payer: Self-pay

## 2020-12-20 ENCOUNTER — Ambulatory Visit (INDEPENDENT_AMBULATORY_CARE_PROVIDER_SITE_OTHER): Payer: Medicare Other | Admitting: Gastroenterology

## 2020-12-20 DIAGNOSIS — Z6836 Body mass index (BMI) 36.0-36.9, adult: Secondary | ICD-10-CM | POA: Insufficient documentation

## 2020-12-20 DIAGNOSIS — N189 Chronic kidney disease, unspecified: Secondary | ICD-10-CM | POA: Insufficient documentation

## 2020-12-20 NOTE — Progress Notes (Signed)
Supervised Weight Loss Visit Bariatric Nutrition Education  Referral stated Supervised Weight Loss (SWL) visits needed: 6  5 out of 6 SWL visits   Planned surgery: RYGB Pt expectation of surgery: to lose weight Pt expectation of dietitian: to teach how to control diet and work with me each step of the way; a cheerleader   NUTRITION ASSESSMENT   Anthropometrics  Start weight at [facility]: 216.7 lbs (date: 06/18/2020)  Weight: 212.6 pounds Height: 63 in BMI: 37.66 kg/m2     Clinical  Medical hx: GERD, COPD, CKD, diverticulosis, liver cirrhosis  Medications: see list; laxative, vitamin D supplement, B12 supplement  Labs:  Notable signs/symptoms: shortness of breath, knee pain Any previous deficiencies? Vitamin D, vitamin b12  Lifestyle & Dietary Hx  Pt state since her colonoscopy she has felt better GI wise.  Pt states she will take this last month to focus in on her foods and making healthy decisions.   Estimated daily fluid intake: 64+ oz Supplements: b12 and vitamin D Current average weekly physical activity: ADL's  24-Hr Dietary Recall First Meal: sausage and egg or skipped Snack: Second Meal: meatloaf + green beans with butter + mashed potatoes with butter Snack:  Third Meal: ham and tomato sandwich or chicken wings and ranch Snack: peanut butter chocolate bar (after having gone to bed) Beverages: 4-5 bottles water + sugar free flavorings, sugar free green tea watermelon   Estimated Energy Needs Calories: 1600   NUTRITION DIAGNOSIS  Overweight/obesity ([place]-3.3) related to past poor dietary habits and physical inactivity as evidenced by patient w/ planned RYGB surgery following dietary guidelines for continued weight loss.   NUTRITION INTERVENTION  Nutrition counseling (C-1) and education (E-2) to facilitate bariatric surgery goals.  Pre-Op Goals Progress & New Goals -Continue: Make healthy food choices while monitoring portion sizes: eat non starchy vegetables 7  days a Week  -Practice CHEWING your food (aim for applesauce consistency): tunr off tv, sit at the table, put fork down in between each bite continue: Aim for 2 bottles of water plain continue: start at [employer] 1-2 days a week (unable to accomplish due to procedures) continue: avoid having extra bread or biscuit with meals  continue avoid using butter continue: get the average protein ensure not high protein  continue: preform stress relief activities   Handouts Preeviously Provided Include  Meal Ideas  Learning Style & Readiness for Change Teaching method utilized: Visual & Auditory  Demonstrated degree of understanding via: Teach Back  Readiness Level: Action Barriers to learning/adherence to lifestyle change: poor historian/communication  RD's Notes for next Visit  Assess pts adherence to chosen goals   MONITORING & EVALUATION Dietary intake, weekly physical activity, body weight, and pre-op goals in 1 month.   Next Steps  Patient is to return to [facility] in 1 month

## 2020-12-31 ENCOUNTER — Other Ambulatory Visit: Payer: Self-pay

## 2020-12-31 ENCOUNTER — Telehealth (INDEPENDENT_AMBULATORY_CARE_PROVIDER_SITE_OTHER): Payer: Medicare Other | Admitting: Psychiatry

## 2020-12-31 ENCOUNTER — Encounter (HOSPITAL_COMMUNITY): Payer: Self-pay | Admitting: Psychiatry

## 2020-12-31 DIAGNOSIS — F331 Major depressive disorder, recurrent, moderate: Secondary | ICD-10-CM | POA: Diagnosis not present

## 2020-12-31 MED ORDER — ALPRAZOLAM 1 MG PO TABS: 1.0000 mg | ORAL_TABLET | Freq: Four times a day (QID) | ORAL | 3 refills | Status: DC

## 2020-12-31 MED ORDER — FLUOXETINE HCL 40 MG PO CAPS: 40.0000 mg | ORAL_CAPSULE | Freq: Every day | ORAL | 3 refills | Status: DC

## 2020-12-31 MED ORDER — TEMAZEPAM 30 MG PO CAPS: 30.0000 mg | ORAL_CAPSULE | Freq: Every evening | ORAL | 3 refills | Status: DC | PRN

## 2020-12-31 NOTE — Progress Notes (Signed)
Virtual Visit via Telephone Note  I connected with Briscoe Deutscher on 12/31/20 at 11:20 AM EDT by telephone and verified that I am speaking with the correct person using two identifiers.  Location: Patient: home Provider: home office   I discussed the limitations, risks, security and privacy concerns of performing an evaluation and management service by telephone and the availability of in person appointments. I also discussed with the patient that there may be a patient responsible charge related to this service. The patient expressed understanding and agreed to proceed.     I discussed the assessment and treatment plan with the patient. The patient was provided an opportunity to ask questions and all were answered. The patient agreed with the plan and demonstrated an understanding of the instructions.   The patient was advised to call back or seek an in-person evaluation if the symptoms worsen or if the condition fails to improve as anticipated.  I provided 12 minutes of non-face-to-face time during this encounter.   [name], MD  [facility] MD/PA/NP OP Progress Note  12/31/2020 11:31 AM Briscoe Deutscher  MRN:  [id number]  Chief Complaint:  Chief Complaint   Depression; Anxiety; Follow-up    HPI: This patient is a 70 year old widowed white female lives alone in [place].  Her daughter and brothers live nearby.  She has 1 son who recently moved back from [place].  She is on disability.  The patient returns for follow-up after 4 months.  She has had a lot of abdominal studies including colonoscopy endoscopy and gastric emptying study.  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  She states that she was having severe constipation but it is much better now.  She was on some sort of narcotic pain medication for her back which causes severe constipation.  Now that she stopped this she is feeling much better.  In terms of mood she states that she has been stable.  She got depressed when her  abdominal pain got severe but now that she is better she is getting out and doing things with family.  She is sleeping well.  She feels that her depression and anxiety are under good control.  She denies thoughts of self-harm or suicidal ideation Visit Diagnosis:    ICD-10-CM   1. Major depressive disorder, recurrent episode, moderate (HCC)  F33.1       Past Psychiatric History: none  Past Medical History:  Past Medical History:  Diagnosis Date   Anxiety    Arthritis    COPD (chronic obstructive pulmonary disease) ([place])    Depression    Diverticulitis 04/2012   [facility]   Essential hypertension    Frequent PVCs    Outflow tract, positive in the inferior leads   GERD (gastroesophageal reflux disease)    Hepatitis C    Treated   History of pneumonia    Insomnia    Migraine headache    Sleep apnea    Splenomegaly    Thyroid disease    Wears glasses     Past Surgical History:  Procedure Laterality Date   ABDOMINAL HYSTERECTOMY     ANKLE SURGERY Right    BIOPSY  10/05/2020   Procedure: BIOPSY;  Surgeon: Harvel Quale, MD;  [facility];  Service: Gastroenterology;;   COLON SURGERY     "part of color removed"   COLONOSCOPY     COLONOSCOPY WITH PROPOFOL N/A 11/13/2020   Procedure: COLONOSCOPY WITH PROPOFOL;  Surgeon: Harvel Quale, MD;  [facility];  Service: Gastroenterology;  Laterality: N/A;  10;15   ESOPHAGEAL MANOMETRY N/A 04/10/2014   Procedure: ESOPHAGEAL MANOMETRY (EM);  Surgeon: Gatha Mayer, MD;  [facility];  Service: Endoscopy;  Laterality: N/A;   ESOPHAGOGASTRODUODENOSCOPY     ESOPHAGOGASTRODUODENOSCOPY (EGD) WITH PROPOFOL N/A 10/05/2020   Procedure: ESOPHAGOGASTRODUODENOSCOPY (EGD) WITH PROPOFOL;  Surgeon: Harvel Quale, MD;  [facility];  Service: Gastroenterology;  Laterality: N/A;  1:15   INCISIONAL HERNIA REPAIR N/A 10/24/2015   Procedure: LAPAROSCOPIC REPAIR  INCISIONAL HERNIA WITH MESH;  Surgeon: Georganna Skeans, MD;  Location: [place];  Service: General;  Laterality: N/A;   INSERTION OF MESH N/A 10/24/2015   Procedure: INSERTION OF MESH;  Surgeon: Georganna Skeans, MD;  Location: [place];  Service: General;  Laterality: N/A;   JOINT REPLACEMENT     KNEE SURGERY Right    x4   [place] IMPEDANCE STUDY N/A 04/10/2014   Procedure: [place] IMPEDANCE STUDY;  Surgeon: Gatha Mayer, MD;  [facility];  Service: Endoscopy;  Laterality: N/A;   TOTAL SHOULDER ARTHROPLASTY Left 04/22/2018   Procedure: Left total shoulder arthroplasty;  Surgeon: Justice Britain, MD;  [facility];  Service: Orthopedics;  Laterality: Left;  166mn    Family Psychiatric History: see below  Family History:  Family History  Problem Relation Age of Onset   Alcohol abuse Father    Dementia Father    Dementia Maternal Grandmother    Bipolar disorder Daughter    Anxiety disorder Daughter    Depression Brother    Drug abuse Brother    Alcohol abuse Paternal Uncle    Depression Paternal Uncle    ADD / ADHD Neg Hx     Social History:  Social History   Socioeconomic History   Marital status: Widowed    Spouse name: Not on file   Number of children: 2   Years of education: 12th grade   Highest education level: Not on file  Occupational History   Occupation: RETIRED  Tobacco Use   Smoking status: Never   Smokeless tobacco: Never  Vaping Use   Vaping Use: Never used  Substance and Sexual Activity   Alcohol use: Yes    Alcohol/week: 3.0 standard drinks    Types: 3 Cans of beer per week    Comment: occasionally   Drug use: No   Sexual activity: Never  Other Topics Concern   Not on file  Social History Narrative   Not on file   Social Determinants of Health   Financial Resource Strain: Not on file  Food Insecurity: Not on file  Transportation Needs: Not on file  Physical Activity: Not on file  Stress: Not on file  Social Connections: Not on file    Allergies:   Allergies  Allergen Reactions   Levothyroxine Hives and Itching   Tylenol [Acetaminophen] Other (See Comments)    Liver function per MD    Nabumetone Rash    Metabolic Disorder Labs: No results found for: HGBA1C, MPG No results found for: PROLACTIN No results found for: CHOL, TRIG, HDL, CHOLHDL, VLDL, LDLCALC Lab Results  Component Value Date   TSH 3.26 10/17/2019   TSH 4.540 (H) 10/28/2007    Therapeutic Level Labs: No results found for: LITHIUM No results found for: VALPROATE No components found for:  CBMZ  Current Medications: Current Outpatient Medications  Medication Sig Dispense Refill   albuterol (PROVENTIL HFA;VENTOLIN HFA) 108 (90 Base) MCG/ACT inhaler Inhale 2 puffs into  the lungs every 6 (six) hours as needed for wheezing or shortness of breath.     albuterol (PROVENTIL) (2.5 MG/3ML) 0.083% nebulizer solution Take 2.5 mg by nebulization every 6 (six) hours as needed for wheezing or shortness of breath.     ALPRAZolam (XANAX) 1 MG tablet Take 1 tablet (1 mg total) by mouth 4 (four) times daily. 120 tablet 3   amitriptyline (ELAVIL) 10 MG tablet Take 1 tablet (10 mg total) by mouth at bedtime. 90 tablet 3   Ascorbic Acid (VITAMIN C) 1000 MG tablet Take 1,000 mg by mouth daily.     aspirin EC 81 MG tablet Take 81 mg by mouth daily.     cyanocobalamin 1000 MCG tablet Take 2,000 mcg by mouth daily.     cyclobenzaprine (FLEXERIL) 10 MG tablet Take 10 mg by mouth every 8 (eight) hours as needed for muscle spasms.     cycloSPORINE (RESTASIS) 0.05 % ophthalmic emulsion Place 1 drop into both eyes 2 (two) times daily as needed (dry eyes).     ELDERBERRY PO Take 1 each by mouth daily.     ergocalciferol (VITAMIN D2) 1.25 MG (50000 UT) capsule Take 50,000 Units by mouth 2 (two) times a week.     FLUoxetine (PROZAC) 40 MG capsule Take 1 capsule (40 mg total) by mouth daily. 30 capsule 3   Fluticasone-Umeclidin-Vilant 100-62.5-25 MCG/INH AEPB Inhale 1 puff into the lungs daily as  needed (asthma).     gabapentin (NEURONTIN) 100 MG capsule Take 300 mg by mouth at bedtime.      hyoscyamine (LEVSIN) 0.125 MG tablet Take 1 tablet (0.125 mg total) by mouth every 8 (eight) hours as needed (abdominal pain). 30 tablet 2   linaclotide (LINZESS) 290 MCG CAPS capsule Take 1 capsule (290 mcg total) by mouth daily before breakfast. 90 capsule 3   meloxicam (MOBIC) 15 MG tablet Take 1 tablet (15 mg total) by mouth daily. (Patient taking differently: Take 15 mg by mouth at bedtime.) 30 tablet 1   metoprolol succinate (TOPROL-XL) 100 MG 24 hr tablet TAKE ONE TABLET BY MOUTH TWICE DAILY. TAKE WITH OR IMMEDIATELY FOLLOWING A MEAL. (DOSE INCREASE) 180 tablet 1   Multiple Vitamin (MULTI-VITAMIN PO) Take 1 tablet by mouth daily.     nystatin (MYCOSTATIN) powder Apply 1 g topically daily.  2   Omega-3 Fatty Acids (FISH OIL) 1200 MG CAPS Take 2,400 mg by mouth daily.     pantoprazole (PROTONIX) 40 MG tablet Take 40 mg by mouth daily.     rosuvastatin (CRESTOR) 10 MG tablet Take 10 mg by mouth daily.     Simethicone (GAS RELIEF PO) Take 2 capsules by mouth as needed (flatulence).      temazepam (RESTORIL) 30 MG capsule Take 1 capsule (30 mg total) by mouth at bedtime as needed. for sleep 30 capsule 3   traMADol (ULTRAM) 50 MG tablet Take 50 mg by mouth 3 (three) times daily as needed for severe pain.     No current facility-administered medications for this visit.     Musculoskeletal: Strength & Muscle Tone: na Gait & Station: na Patient leans: N/A  Psychiatric Specialty Exam: Review of Systems  Musculoskeletal:  Positive for back pain.  All other systems reviewed and are negative.  There were no vitals taken for this visit.There is no height or weight on file to calculate BMI.  General Appearance: NA  Eye Contact:  NA  Speech:  Clear and Coherent  Volume:  Normal  Mood:  Euthymic  Affect:  NA  Thought Process:  Goal Directed  Orientation:  Full (Time, Place, and Person)  Thought  Content: WDL   Suicidal Thoughts:  No  Homicidal Thoughts:  No  Memory:  Immediate;   Good Recent;   Good Remote;   Good  Judgement:  Good  Insight:  Fair  Psychomotor Activity:  Decreased  Concentration:  Concentration: Good and Attention Span: Good  Recall:  Good  Fund of Knowledge: Good  Language: Good  Akathisia:  No  Handed:  Right  AIMS (if indicated): not done  Assets:  Communication Skills Desire for Improvement Resilience Social Support Talents/Skills  ADL's:  Intact  Cognition: WNL  Sleep:  Good   Screenings: PHQ2-9    Flowsheet Row Video Visit from 12/31/2020 in [facility] Video Visit from 08/27/2020 in [facility] from 06/18/2020 in [facility]  PHQ-2 Total Score 0 1 0      Flowsheet Row Video Visit from 12/31/2020 in [facility] Admission (Discharged) from 11/13/2020 in [facility] [facility] from 11/08/2020 in [place] No Risk No Risk No Risk        Assessment and Plan: This patient is a 70 year old female with a history of depression and anxiety.  Despite her recent medical issues she is doing well on her current regimen.  She will continue Prozac 40 mg daily for depression, Xanax 1 mg 4 times daily for anxiety and Restoril 30 mg at bedtime as needed for sleep.  She will return to see me in 4 months   [name], MD 12/31/2020, 11:31 AM

## 2021-01-07 NOTE — Progress Notes (Signed)
Cardiology Office Note  Date: 01/08/2021   ID: [name], DOB 01-16-1951, MRN [id number]  PCP:  Glenda Chroman, MD  Cardiologist:  Rozann Lesches, MD Electrophysiologist:  None   Chief Complaint  Patient presents with   Cardiac follow-up    History of Present Illness: [name] is a 70 y.o. female last seen in February.  She is here for a follow-up visit.  Reports occasional sense of palpitations and lightheadedness, no chest pain or syncope.  She has continued on Toprol-XL as noted below.  She is following with Dr. Jenetta Downer, history of NASH cirrhosis, considering possible bariatric surgery.  I also talked with her about the PREP exercise program through [facility].  Echocardiogram from March of this year revealed normal LVEF at 55 to 60% with moderate diastolic dysfunction, mild to moderate mitral regurgitation, and sclerotic aortic valve without stenosis.  Past Medical History:  Diagnosis Date   Anxiety    Arthritis    COPD (chronic obstructive pulmonary disease) ([place])    Depression    Diverticulitis 04/2012   [facility]   Essential hypertension    Frequent PVCs    Outflow tract, positive in the inferior leads   GERD (gastroesophageal reflux disease)    Hepatitis C    Treated   History of pneumonia    Insomnia    Migraine headache    NASH (nonalcoholic steatohepatitis)    Sleep apnea    Splenomegaly    Thyroid disease    Wears glasses     Past Surgical History:  Procedure Laterality Date   ABDOMINAL HYSTERECTOMY     ANKLE SURGERY Right    BIOPSY  10/05/2020   Procedure: BIOPSY;  Surgeon: Harvel Quale, MD;  [facility];  Service: Gastroenterology;;   COLON SURGERY     "part of color removed"   COLONOSCOPY     COLONOSCOPY WITH PROPOFOL N/A 11/13/2020   Procedure: COLONOSCOPY WITH PROPOFOL;  Surgeon: Harvel Quale, MD;  [facility];  Service: Gastroenterology;  Laterality: N/A;  10;15    ESOPHAGEAL MANOMETRY N/A 04/10/2014   Procedure: ESOPHAGEAL MANOMETRY (EM);  Surgeon: Gatha Mayer, MD;  [facility];  Service: Endoscopy;  Laterality: N/A;   ESOPHAGOGASTRODUODENOSCOPY     ESOPHAGOGASTRODUODENOSCOPY (EGD) WITH PROPOFOL N/A 10/05/2020   Procedure: ESOPHAGOGASTRODUODENOSCOPY (EGD) WITH PROPOFOL;  Surgeon: Harvel Quale, MD;  [facility];  Service: Gastroenterology;  Laterality: N/A;  1:15   INCISIONAL HERNIA REPAIR N/A 10/24/2015   Procedure: LAPAROSCOPIC REPAIR INCISIONAL HERNIA WITH MESH;  Surgeon: Georganna Skeans, MD;  Location: [place];  Service: General;  Laterality: N/A;   INSERTION OF MESH N/A 10/24/2015   Procedure: INSERTION OF MESH;  Surgeon: Georganna Skeans, MD;  Location: [place];  Service: General;  Laterality: N/A;   JOINT REPLACEMENT     KNEE SURGERY Right    x4   [place] IMPEDANCE STUDY N/A 04/10/2014   Procedure: [place] IMPEDANCE STUDY;  Surgeon: Gatha Mayer, MD;  [facility];  Service: Endoscopy;  Laterality: N/A;   TOTAL SHOULDER ARTHROPLASTY Left 04/22/2018   Procedure: Left total shoulder arthroplasty;  Surgeon: Justice Britain, MD;  [facility];  Service: Orthopedics;  Laterality: Left;  11mn    Current Outpatient Medications  Medication Sig Dispense Refill   albuterol (PROVENTIL HFA;VENTOLIN HFA) 108 (90 Base) MCG/ACT inhaler Inhale 2 puffs into the lungs every 6 (six) hours as needed for wheezing or shortness of breath.  albuterol (PROVENTIL) (2.5 MG/3ML) 0.083% nebulizer solution Take 2.5 mg by nebulization every 6 (six) hours as needed for wheezing or shortness of breath.     ALPRAZolam (XANAX) 1 MG tablet Take 1 tablet (1 mg total) by mouth 4 (four) times daily. 120 tablet 3   amitriptyline (ELAVIL) 10 MG tablet Take 1 tablet (10 mg total) by mouth at bedtime. 90 tablet 3   Ascorbic Acid (VITAMIN C) 1000 MG tablet Take 1,000 mg by mouth daily.     aspirin EC 81 MG tablet Take 81 mg by mouth daily.      cyanocobalamin 1000 MCG tablet Take 2,000 mcg by mouth daily.     cyclobenzaprine (FLEXERIL) 10 MG tablet Take 10 mg by mouth every 8 (eight) hours as needed for muscle spasms.     cycloSPORINE (RESTASIS) 0.05 % ophthalmic emulsion Place 1 drop into both eyes 2 (two) times daily as needed (dry eyes).     ELDERBERRY PO Take 1 each by mouth daily.     ergocalciferol (VITAMIN D2) 1.25 MG (50000 UT) capsule Take 50,000 Units by mouth 2 (two) times a week.     FLUoxetine (PROZAC) 40 MG capsule Take 1 capsule (40 mg total) by mouth daily. 30 capsule 3   Fluticasone-Umeclidin-Vilant 100-62.5-25 MCG/INH AEPB Inhale 1 puff into the lungs daily as needed (asthma).     gabapentin (NEURONTIN) 100 MG capsule Take 300 mg by mouth at bedtime.      hyoscyamine (LEVSIN) 0.125 MG tablet Take 1 tablet (0.125 mg total) by mouth every 8 (eight) hours as needed (abdominal pain). 30 tablet 2   linaclotide (LINZESS) 290 MCG CAPS capsule Take 1 capsule (290 mcg total) by mouth daily before breakfast. 90 capsule 3   meloxicam (MOBIC) 15 MG tablet Take 1 tablet (15 mg total) by mouth daily. (Patient taking differently: Take 15 mg by mouth at bedtime.) 30 tablet 1   metoprolol succinate (TOPROL-XL) 100 MG 24 hr tablet TAKE ONE TABLET BY MOUTH TWICE DAILY. TAKE WITH OR IMMEDIATELY FOLLOWING A MEAL. (DOSE INCREASE) 180 tablet 1   Multiple Vitamin (MULTI-VITAMIN PO) Take 1 tablet by mouth daily.     nystatin (MYCOSTATIN) powder Apply 1 g topically daily.  2   Omega-3 Fatty Acids (FISH OIL) 1200 MG CAPS Take 2,400 mg by mouth daily.     pantoprazole (PROTONIX) 40 MG tablet Take 40 mg by mouth daily.     rosuvastatin (CRESTOR) 10 MG tablet Take 10 mg by mouth daily.     Simethicone (GAS RELIEF PO) Take 2 capsules by mouth as needed (flatulence).      temazepam (RESTORIL) 30 MG capsule Take 1 capsule (30 mg total) by mouth at bedtime as needed. for sleep 30 capsule 3   traMADol (ULTRAM) 50 MG tablet Take 50 mg by mouth 3 (three)  times daily as needed for severe pain.     No current facility-administered medications for this visit.   Allergies:  Levothyroxine, Tylenol [acetaminophen], and Nabumetone   ROS: No syncope.  Physical Exam: VS:  BP 126/82 (BP Location: Left Arm, Patient Position: Sitting, Cuff Size: Large)   Pulse 73   Ht 5' 3"  (1.6 m)   Wt 217 lb 6.4 oz (98.6 kg)   SpO2 98%   BMI 38.51 kg/m , BMI Body mass index is 38.51 kg/m.  Wt Readings from Last 3 Encounters:  01/08/21 217 lb 6.4 oz (98.6 kg)  12/20/20 212 lb 9.6 oz (96.4 kg)  11/08/20 214 lb (97.1  kg)    General: Patient appears comfortable at rest. HEENT: Conjunctiva and lids normal, wearing a mask. Neck: Supple, no elevated JVP or carotid bruits, no thyromegaly. Lungs: Clear to auscultation, nonlabored breathing at rest. Cardiac: Regular rate and rhythm with occasional ectopy, no S3, 2/6 systolic murmur, no pericardial rub. Extremities: No pitting edema.  ECG:  An ECG dated  05/09/2020 was personally reviewed today and demonstrated:  Sinus rhythm with frequent PVCs, upright in the inferior leads.  Recent Labwork: 09/25/2020: ALT 47; AST 65; BUN 15; Creatinine, Ser 0.58; Potassium 3.9; Sodium 137 11/06/2020: Hemoglobin 12.1; Platelets 75   Other Studies Reviewed Today:  Lexiscan Myoview 02/09/2019: There was no ST segment deviation noted during stress. Normal myocardial perfusion. Extensive soft tissue attenuation noted. This is an intermediate risk study, primarily based on reduced LVEF. However, LV function appears grossly normal and the reduced LVEF may be due to gating artifact. If echocardiogram is performed and LV systolic function is found to be normal, this would be considered a low risk study. Nuclear stress EF: 37%.  Echocardiogram 05/25/2020:  1. Left ventricular ejection fraction, by estimation, is 55 to 60%. The  left ventricle has normal function. The left ventricle has no regional  wall motion abnormalities. Left  ventricular diastolic parameters are  consistent with Grade II diastolic  dysfunction (pseudonormalization).   2. Right ventricular systolic function is normal. The right ventricular  size is normal. There is normal pulmonary artery systolic pressure. The  estimated right ventricular systolic pressure is 78.9 mmHg.   3. Left atrial size was severely dilated.   4. The mitral valve is grossly normal. Mild to moderate mitral valve  regurgitation.   5. The aortic valve is tricuspid. There is mild calcification of the  aortic valve. Aortic valve regurgitation is not visualized. Mild aortic  valve sclerosis is present, with no evidence of aortic valve stenosis.   6. The inferior vena cava is normal in size with greater than 50%  respiratory variability, suggesting right atrial pressure of 3 mmHg.   Assessment and Plan:  1.  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  No associated syncope.  LVEF normal.  Continue Toprol-XL and observation.  2.  Essential hypertension, blood pressure is reasonable today.  She is on Toprol-XL at this time and continues to follow with Dr. Woody Seller.  3.  NASH cirrhosis, followed by Dr. Jenetta Downer and undergoing work-up for potential bariatric surgery.  I also made referral for the PREP exercise program through [facility].  Medication Adjustments/Labs and Tests Ordered: Current medicines are reviewed at length with the patient today.  Concerns regarding medicines are outlined above.   Tests Ordered: Orders Placed This Encounter  Procedures   Amb Referral To Provider Referral Exercise Program (P.R.E.P)     Medication Changes: No orders of the defined types were placed in this encounter.   Disposition:  Follow up  6 months.  Signed, Satira Sark, MD, [facility] 01/08/2021 9:03 AM    [place] [facility] [place], [place], [place] [address] Phone: [phone]; Fax: [phone]

## 2021-01-08 ENCOUNTER — Encounter: Payer: Self-pay | Admitting: Cardiology

## 2021-01-08 ENCOUNTER — Ambulatory Visit (INDEPENDENT_AMBULATORY_CARE_PROVIDER_SITE_OTHER): Payer: Medicare Other | Admitting: Cardiology

## 2021-01-08 VITALS — BP 126/82 | HR 73 | Ht 63.0 in | Wt 217.4 lb

## 2021-01-08 DIAGNOSIS — I493 Ventricular premature depolarization: Secondary | ICD-10-CM | POA: Diagnosis not present

## 2021-01-08 DIAGNOSIS — I10 Essential (primary) hypertension: Secondary | ICD-10-CM | POA: Diagnosis not present

## 2021-01-08 NOTE — Patient Instructions (Addendum)

## 2021-01-09 ENCOUNTER — Telehealth: Payer: Self-pay

## 2021-01-09 NOTE — Telephone Encounter (Signed)
VMT pt requesting call back to discuss referral to PREP have class in [place] she could participate in.

## 2021-01-16 ENCOUNTER — Other Ambulatory Visit: Payer: Self-pay

## 2021-01-16 ENCOUNTER — Encounter: Payer: Medicare Other | Attending: Surgery | Admitting: Skilled Nursing Facility1

## 2021-01-16 DIAGNOSIS — Z6837 Body mass index (BMI) 37.0-37.9, adult: Secondary | ICD-10-CM | POA: Diagnosis not present

## 2021-01-16 DIAGNOSIS — Z713 Dietary counseling and surveillance: Secondary | ICD-10-CM | POA: Insufficient documentation

## 2021-01-16 DIAGNOSIS — N189 Chronic kidney disease, unspecified: Secondary | ICD-10-CM | POA: Diagnosis not present

## 2021-01-16 DIAGNOSIS — J449 Chronic obstructive pulmonary disease, unspecified: Secondary | ICD-10-CM | POA: Diagnosis not present

## 2021-01-16 DIAGNOSIS — Z6836 Body mass index (BMI) 36.0-36.9, adult: Secondary | ICD-10-CM | POA: Insufficient documentation

## 2021-01-16 DIAGNOSIS — K7460 Unspecified cirrhosis of liver: Secondary | ICD-10-CM | POA: Insufficient documentation

## 2021-01-16 NOTE — Progress Notes (Signed)
Supervised Weight Loss Visit Bariatric Nutrition Education  Referral stated Supervised Weight Loss (SWL) visits needed: 6  Pt completed visits.   Pt has cleared nutrition requirements.      6 out of 6 SWL visits   Planned surgery: RYGB Pt expectation of surgery: to lose weight Pt expectation of dietitian: to teach how to control diet and work with me each step of the way; a cheerleader   NUTRITION ASSESSMENT   Anthropometrics  Start weight at [facility]: 216.7 lbs (date: 06/18/2020)  Weight: 212.6 pounds Height: 63 in BMI: 37.66 kg/m2     Clinical  Medical hx: GERD, COPD, CKD, diverticulosis, liver cirrhosis  Medications: see list; laxative, vitamin D supplement, B12 supplement  Labs: (11/06/20) RBC 3.48, HCT 34.6, MCH 34.8 Notable signs/symptoms: shortness of breath, knee pain Any previous deficiencies? Vitamin D, vitamin b12  Lifestyle & Dietary Hx  Pt states in the last 6 months she has learned to watch the intake of her fatty foods, how to cook her foods, what to et and not to eat, a healthier way to cook her foods, and to drink more water.   Pt states she will have to really focus in on her food intake and her exeresis long term after surgery.   Estimated daily fluid intake: 64+ oz Supplements: b12 and vitamin D Current average weekly physical activity: walking 3 days a week  24-Hr Dietary Recall First Meal: sausage and egg or skipped Snack: Second Meal: meatloaf + green beans with butter + mashed potatoes with butter Snack:  Third Meal: ham and tomato sandwich or chicken wings and ranch Snack: peanut butter chocolate bar (after having gone to bed) Beverages: 4-5 bottles water + sugar free flavorings, sugar free green tea watermelon   Estimated Energy Needs Calories: 1600   NUTRITION DIAGNOSIS  Overweight/obesity ([place]-3.3) related to past poor dietary habits and physical inactivity as evidenced by patient w/ planned RYGB surgery following dietary guidelines for  continued weight loss.   NUTRITION INTERVENTION  Nutrition counseling (C-1) and education (E-2) to facilitate bariatric surgery goals. Using the teach back method pt was appropriately quizzed for proper adherence to the current and future recommendations   Pre-Op Goals Progress & New Goals -Continue: Make healthy food choices while monitoring portion sizes: eat non starchy vegetables 7 days a Week  -Practice CHEWING your food (aim for applesauce consistency): tunr off tv, sit at the table, put fork down in between each bite continue: Aim for 2 bottles of water plain continue: start at [employer] 1-2 days a week (unable to accomplish due to procedures) continue: avoid having extra bread or biscuit with meals  continue avoid using butter continue: get the average protein ensure not high protein  continue: preform stress relief activities   Handouts Preeviously Provided Include  Meal Ideas  Learning Style & Readiness for Change Teaching method utilized: Visual & Auditory  Demonstrated degree of understanding via: Teach Back  Readiness Level: Action Barriers to learning/adherence to lifestyle change: poor historian  RD's Notes for next Visit  Assess pts adherence to chosen goals   MONITORING & EVALUATION Dietary intake, weekly physical activity, body weight, and pre-op goals   Next Steps  Patient is to return to [facility] for pre-op class Pt has completed visits. No further supervised visits required/recomended

## 2021-01-24 ENCOUNTER — Other Ambulatory Visit: Payer: Self-pay

## 2021-01-24 ENCOUNTER — Encounter (INDEPENDENT_AMBULATORY_CARE_PROVIDER_SITE_OTHER): Payer: Self-pay | Admitting: Gastroenterology

## 2021-01-24 ENCOUNTER — Ambulatory Visit (INDEPENDENT_AMBULATORY_CARE_PROVIDER_SITE_OTHER): Payer: Medicare Other | Admitting: Gastroenterology

## 2021-01-24 VITALS — BP 131/72 | HR 70 | Temp 98.4°F | Ht 63.0 in | Wt 210.4 lb

## 2021-01-24 DIAGNOSIS — K7581 Nonalcoholic steatohepatitis (NASH): Secondary | ICD-10-CM

## 2021-01-24 DIAGNOSIS — K2270 Barrett's esophagus without dysplasia: Secondary | ICD-10-CM

## 2021-01-24 DIAGNOSIS — K5900 Constipation, unspecified: Secondary | ICD-10-CM | POA: Diagnosis not present

## 2021-01-24 MED ORDER — PANTOPRAZOLE SODIUM 40 MG PO TBEC: 40.0000 mg | DELAYED_RELEASE_TABLET | Freq: Two times a day (BID) | ORAL | 5 refills | Status: DC

## 2021-01-24 NOTE — Patient Instructions (Addendum)
We will update your liver Olevia Perches will call to get you scheduled for this. You will be due for labs in January 2023, we can mail these to you then or you can come by and pick them up.   - Reduce salt intake to <2 g per day - Can take Tylenol max of 2 g per day (650 mg q8h) for pain - Avoid NSAIDs for pain - Avoid eating raw oysters/shellfish -continue weight management/loss with diet and exercise -increase protonix 48m to twice a day, refill sent -continue linzess 290 daily for constipation

## 2021-01-24 NOTE — Progress Notes (Signed)
Referring Provider: Glenda Chroman, MD Primary Care Physician:  Glenda Chroman, MD Primary GI Physician: Jenetta Downer  Chief Complaint  Patient presents with   Follow-up    2 month follow up on liver biopsy and colonoscopy. Taking linzess and states it is doing well for her.    HPI:   [name] is a 70 y.o. female with past medical history of Hep C s/p treatment, in remission, anxiety, IBS-C, RA, COPD, GERD, opioid induced constipation, HTN, hypothyroidism, SVT, recurrent diverticulitis s/p partial colectomy and NASH and Hep C.  Patient presenting today for follow up after colonoscopy and liver biopsy in August.  Last seen 09/27/20 with new onset LUQ pain with nausea and dry heaves, started on levsin at that time for abdominal pain, EGD thereafter, findings as below.   Patient had previous blood work on 7/12 during hospital visit for her LUQ pain that revealed AST 65, ALT 47, Alk phos 86, plt 99. CT A/P with contrast at that time showed presence of mild left sided diverticulosis, steatosis and cirrhotic morphology of liver with worsening splenomegaly with 16cm in size. Notably, 05/18/20, she had normal elastography, normal IgG, INR, CBC with plt 130, AFP 8.1, neg ASMA, AMA and normal iron stores with undetectable HCVRNA.   INR and AFP checked at last visit in July.  AFP 8 and INR 1,  Patient underwent liver biopsy in august 2022 for further evaluation that revealed mildly active steatohepatitis with presence of advanced fibrosis stage 3/4, recommended patient adhere to mediterranean diet and try losing weight with close following every 6 months.   She reports that LUQ pain and bloating has resolved since having colonoscopy in august.  IBS-C: currently on linzess 290 mcg, doing well on this. She reports that as long she does not skip a dose, this works well for her, she is having 1-2 BMs per day without any episodes of diarrhea.  Barrett's esophagus/reflux: currently on protonix 80m daily,  she reports that she will have breakthrough acid reflux if she eats something spicy, occasionally she will take tums or double up on her protonix dose, reports maybe a couple of episodes of reflux per week. Denies dysphagia, odyonophagia, early satiety.   She states that she was previously going to have weight loss surgery, she not to due to the risks and her age, [facility]. Initially she weighed 224 when she started the weight loss surgery process, she did complete her nutrition class that she needed for preparation of the weight loss surgery. She states that she has lost 14 lbs in the past 6 months. She is doing a well balanced diet high in lean meats and fruits and vegetables.   No red flag symptoms. Patient denies melena, hematochezia, nausea, vomiting, diarrhea, constipation, dysphagia, odyonophagia, early satiety or weight loss.   She does drink alcohol on occasion, uses ibuprofen occasionally.  Gastric emptying study August 2022: normal Last Colonoscopy:11/13/20- A single non-bleeding colonic angiodysplastic lesion. - Diverticulosis in the sigmoid colon. - Non-bleeding external internal hemorrhoids. - No specimens collected.  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 (Short segment barretts) - 1 cm hiatal hernia. - Normal stomach. - Normal examined duodenum. Biopsied-normal No presence of varices  Recommendations:  Repeat EGD in 2 years for surveillance of varices/5 years for barrett's Repeat colonoscopy 10 years  Past Medical History:  Diagnosis Date   Anxiety    Arthritis    COPD (chronic obstructive pulmonary disease) ([place])    Depression    Diverticulitis 04/2012   [facility]   Essential hypertension    Frequent PVCs    Outflow tract, positive in the inferior leads   GERD (gastroesophageal  reflux disease)    Hepatitis C    Treated   History of pneumonia    Insomnia    Migraine headache    NASH (nonalcoholic steatohepatitis)    Sleep apnea    Splenomegaly    Thyroid disease    Wears glasses     Past Surgical History:  Procedure Laterality Date   ABDOMINAL HYSTERECTOMY     ANKLE SURGERY Right    BIOPSY  10/05/2020   Procedure: BIOPSY;  Surgeon: Harvel Quale, MD;  [facility];  Service: Gastroenterology;;   COLON SURGERY     "part of color removed"   COLONOSCOPY     COLONOSCOPY WITH PROPOFOL N/A 11/13/2020   Procedure: COLONOSCOPY WITH PROPOFOL;  Surgeon: Harvel Quale, MD;  [facility];  Service: Gastroenterology;  Laterality: N/A;  10;15   ESOPHAGEAL MANOMETRY N/A 04/10/2014   Procedure: ESOPHAGEAL MANOMETRY (EM);  Surgeon: Gatha Mayer, MD;  [facility]: [facility];  Service: Endoscopy;  Laterality: N/A;   ESOPHAGOGASTRODUODENOSCOPY     ESOPHAGOGASTRODUODENOSCOPY (EGD) WITH PROPOFOL N/A 10/05/2020   Procedure: ESOPHAGOGASTRODUODENOSCOPY (EGD) WITH PROPOFOL;  Surgeon: Harvel Quale, MD;  [facility];  Service: Gastroenterology;  Laterality: N/A;  1:15   INCISIONAL HERNIA REPAIR N/A 10/24/2015   Procedure: LAPAROSCOPIC REPAIR INCISIONAL HERNIA WITH MESH;  Surgeon: Georganna Skeans, MD;  Location: [place];  Service: General;  Laterality: N/A;   INSERTION OF MESH N/A 10/24/2015   Procedure: INSERTION OF MESH;  Surgeon: Georganna Skeans, MD;  Location: [place];  Service: General;  Laterality: N/A;   JOINT REPLACEMENT     KNEE SURGERY Right    x4   [place] IMPEDANCE STUDY N/A 04/10/2014   Procedure: [place] IMPEDANCE STUDY;  Surgeon: Gatha Mayer, MD;  [facility];  Service: Endoscopy;  Laterality: N/A;   TOTAL SHOULDER ARTHROPLASTY Left 04/22/2018   Procedure: Left total shoulder arthroplasty;  Surgeon: Justice Britain, MD;  Location: [facility];  Service: Orthopedics;  Laterality: Left;  11mn    Current  Outpatient Medications  Medication Sig Dispense Refill   albuterol (PROVENTIL HFA;VENTOLIN HFA) 108 (90 Base) MCG/ACT inhaler Inhale 2 puffs into the lungs every 6 (six) hours as needed for wheezing or shortness of breath.     albuterol (PROVENTIL) (2.5 MG/3ML) 0.083% nebulizer solution Take 2.5 mg by nebulization every 6 (six) hours as needed for wheezing or shortness of breath.     ALPRAZolam (XANAX) 1 MG tablet Take 1 tablet (1 mg total) by mouth 4 (four) times daily. 120 tablet 3   amitriptyline (ELAVIL) 10 MG tablet Take 1 tablet (10 mg total) by mouth at bedtime. 90 tablet 3   Ascorbic Acid (VITAMIN C) 1000 MG tablet Take 1,000 mg by mouth daily.     aspirin EC 81 MG tablet Take 81 mg by mouth daily.     cyanocobalamin 1000 MCG tablet Take 2,000 mcg by mouth daily.     cyclobenzaprine (FLEXERIL) 10 MG tablet Take 10 mg by mouth every 8 (eight) hours as needed for muscle spasms.     cycloSPORINE (RESTASIS) 0.05 % ophthalmic emulsion Place 1 drop into both eyes 2 (two)  times daily as needed (dry eyes).     ELDERBERRY PO Take 1 each by mouth daily.     ergocalciferol (VITAMIN D2) 1.25 MG (50000 UT) capsule Take 50,000 Units by mouth 2 (two) times a week.     FLUoxetine (PROZAC) 40 MG capsule Take 1 capsule (40 mg total) by mouth daily. 30 capsule 3   Fluticasone-Umeclidin-Vilant 100-62.5-25 MCG/INH AEPB Inhale 1 puff into the lungs daily as needed (asthma).     gabapentin (NEURONTIN) 100 MG capsule Take 300 mg by mouth at bedtime.      hyoscyamine (LEVSIN) 0.125 MG tablet Take 1 tablet (0.125 mg total) by mouth every 8 (eight) hours as needed (abdominal pain). 30 tablet 2   linaclotide (LINZESS) 290 MCG CAPS capsule Take 1 capsule (290 mcg total) by mouth daily before breakfast. 90 capsule 3   meloxicam (MOBIC) 15 MG tablet Take 1 tablet (15 mg total) by mouth daily. (Patient taking differently: Take 15 mg by mouth at bedtime.) 30 tablet 1   metoprolol succinate (TOPROL-XL) 100 MG 24 hr tablet  TAKE ONE TABLET BY MOUTH TWICE DAILY. TAKE WITH OR IMMEDIATELY FOLLOWING A MEAL. (DOSE INCREASE) 180 tablet 1   Multiple Vitamin (MULTI-VITAMIN PO) Take 1 tablet by mouth daily.     nystatin (MYCOSTATIN) powder Apply 1 g topically daily.  2   Omega-3 Fatty Acids (FISH OIL) 1200 MG CAPS Take 2,400 mg by mouth daily.     pantoprazole (PROTONIX) 40 MG tablet Take 40 mg by mouth daily.     rosuvastatin (CRESTOR) 10 MG tablet Take 10 mg by mouth daily.     Simethicone (GAS RELIEF PO) Take 2 capsules by mouth as needed (flatulence).      temazepam (RESTORIL) 30 MG capsule Take 1 capsule (30 mg total) by mouth at bedtime as needed. for sleep 30 capsule 3   traMADol (ULTRAM) 50 MG tablet Take 50 mg by mouth 3 (three) times daily as needed for severe pain.     No current facility-administered medications for this visit.    Allergies as of 01/24/2021 - Review Complete 01/24/2021  Allergen Reaction Noted   Levothyroxine Hives and Itching 11/25/2013   Tylenol [acetaminophen] Other (See Comments) 10/18/2015   Nabumetone Rash 10/11/2019    Family History  Problem Relation Age of Onset   Alcohol abuse Father    Dementia Father    Dementia Maternal Grandmother    Bipolar disorder Daughter    Anxiety disorder Daughter    Depression Brother    Drug abuse Brother    Alcohol abuse Paternal Uncle    Depression Paternal Uncle    ADD / ADHD Neg Hx     Social History   Socioeconomic History   Marital status: Widowed    Spouse name: Not on file   Number of children: 2   Years of education: 12th grade   Highest education level: Not on file  Occupational History   Occupation: RETIRED  Tobacco Use   Smoking status: Never   Smokeless tobacco: Never  Vaping Use   Vaping Use: Never used  Substance and Sexual Activity   Alcohol use: Yes    Alcohol/week: 3.0 standard drinks    Types: 3 Cans of beer per week    Comment: occasionally   Drug use: No   Sexual activity: Never  Other Topics Concern    Not on file  Social History Narrative   Not on file   Social Determinants of Health   Financial Resource  Strain: Not on file  Food Insecurity: Not on file  Transportation Needs: Not on file  Physical Activity: Not on file  Stress: Not on file  Social Connections: Not on file   Review of systems General: negative for malaise, night sweats, fever, chills, weight loss Neck: Negative for lumps, goiter, pain and significant neck swelling Resp: Negative for cough, wheezing, dyspnea at rest CV: Negative for chest pain, leg swelling, palpitations, orthopnea GI: denies melena, hematochezia, nausea, vomiting, diarrhea, constipation, dysphagia, odyonophagia, early satiety or unintentional weight loss.  MSK: Negative for joint pain or swelling, back pain, and muscle pain. Derm: Negative for itching or rash Psych: Denies depression, anxiety, memory loss, confusion. No homicidal or suicidal ideation.  Heme: Negative for prolonged bleeding, bruising easily, and swollen nodes. Endocrine: Negative for cold or heat intolerance, polyuria, polydipsia and goiter. Neuro: negative for tremor, gait imbalance, syncope and seizures. The remainder of the review of systems is noncontributory.  Physical Exam: BP 131/72 (BP Location: Right Arm, Patient Position: Sitting, Cuff Size: Large)   Pulse 70   Temp 98.4 F (36.9 C) (Oral)   Ht 5' 3"  (1.6 m)   Wt 210 lb 6.4 oz (95.4 kg)   BMI 37.27 kg/m  General:   Alert and oriented. No distress noted. Pleasant and cooperative.  Head:  Normocephalic and atraumatic. Eyes:  Conjuctiva clear without scleral icterus. Mouth:  Oral mucosa pink and moist. Good dentition. No lesions. Heart: Normal rate and rhythm, s1 and s2 heart sounds present.  Lungs: Clear lung sounds in all lobes. Respirations equal and unlabored. Abdomen:  +BS, soft, non-tender and non-distended. No rebound or guarding. No HSM or masses noted. Derm: No palmar erythema or jaundice Msk:   Symmetrical without gross deformities. Normal posture. Extremities:  Without edema. Neurologic:  Alert and  oriented x4 Psych:  Alert and cooperative. Normal mood and affect.  Invalid input(s): 6 MONTHS   ASSESSMENT: [name] is a 69 y.o. female presenting today for follow up after EGD/Colonoscopy and liver biopsy.  Patient presented previously in July 2022 for LUQ pain and bloating, she had EGD in July that revealed barrett's esophagus. She was started on protonix 78m once daily, however, continues to have breakthrough reflux a few times per week. We will increase PPI to BID. LUQ pain and bloating have subsided.  Constipation is well controlled on Linzess 290 mcg daily, having 1-2 soft but formed BMs/ per day. Will continue with current dosing.   Liver biopsy in August revealed advanced fibrosis stage 3 of 4 patient was counseled on importance of diet and exercise by Dr. CJenetta Downerwhen results of biopsy were discussed. Patient previously being worked up to have weight loss surgery, however, she decided against this due to risks and her age. [facility] that [facility] needed for weight loss surgery she was planning to have. She is eating a well balanced diet high in protein/lean meats, fruits and veggies. She has lost 14 lbs over the past 6 months. I congratulated on her on this. We discussed that if she is willing to stick to a healthy eating plan with routine exercise in order to better manage her weight, this will be helpful in preventing further liver damage. She reports occasional alcohol as well as occasional ibuprofen use, she was counseled on importance of cessation of both given her current liver disease. She is aware of the importance of 6 month routine monitoring of  her liver. We will update her Liver US as last was in April 2022. She will be due for MELD labs and AFP in January, we will update INR at that time as well to  keep all liver surveillance labs on track with 6 month timing.   No red flag symptoms. Patient denies melena, hematochezia, nausea, vomiting, diarrhea, constipation, dysphagia, odyonophagia, early satiety or weight loss.   MELD in July 2022: 6  PLAN:  -Due for AFP/INR/HFP/CBC in Jan 2023-ordered -Update RUQ [place] - Reduce salt intake to <2 g per day - Can take Tylenol max of 2 g per day (650 mg q8h) for pain - Avoid NSAIDs for pain - Avoid eating raw oysters/shellfish -counseled on alcohol cessation -continue weight management -continue to incorporate regular exercise -increase protonix 67m to BID, refill sent -continue linzess 2953m daily   Follow Up: 6 months  Vikkie Goeden L. CaAlver SorrowMSN, APRN, AGNP-C Adult-Gerontology Nurse Practitioner [facility]

## 2021-01-25 ENCOUNTER — Telehealth: Payer: Self-pay

## 2021-01-25 ENCOUNTER — Other Ambulatory Visit (INDEPENDENT_AMBULATORY_CARE_PROVIDER_SITE_OTHER): Payer: Self-pay | Admitting: *Deleted

## 2021-01-25 NOTE — Telephone Encounter (Signed)
Call to pt.  Explained PREP to pt.  Interested in participating. Needs an AM class.  Lives in [place], can come to [place] uses transportation services available thru her insurance  Explained will call her back as soon as we have an am class starting.

## 2021-01-31 ENCOUNTER — Ambulatory Visit (HOSPITAL_COMMUNITY)
Admission: RE | Admit: 2021-01-31 | Discharge: 2021-01-31 | Disposition: A | Discharge status: Home / Self Care | Payer: Medicare Other | Source: Ambulatory Visit | Attending: Gastroenterology | Admitting: Gastroenterology

## 2021-01-31 ENCOUNTER — Other Ambulatory Visit: Payer: Self-pay

## 2021-01-31 DIAGNOSIS — K7581 Nonalcoholic steatohepatitis (NASH): Secondary | ICD-10-CM | POA: Insufficient documentation

## 2021-03-13 ENCOUNTER — Telehealth (INDEPENDENT_AMBULATORY_CARE_PROVIDER_SITE_OTHER): Payer: Self-pay | Admitting: *Deleted

## 2021-03-13 ENCOUNTER — Other Ambulatory Visit (INDEPENDENT_AMBULATORY_CARE_PROVIDER_SITE_OTHER): Payer: Self-pay | Admitting: *Deleted

## 2021-03-13 DIAGNOSIS — M1990 Unspecified osteoarthritis, unspecified site: Secondary | ICD-10-CM | POA: Diagnosis not present

## 2021-03-13 DIAGNOSIS — I10 Essential (primary) hypertension: Secondary | ICD-10-CM | POA: Diagnosis not present

## 2021-03-13 DIAGNOSIS — G629 Polyneuropathy, unspecified: Secondary | ICD-10-CM | POA: Diagnosis not present

## 2021-03-13 DIAGNOSIS — K7581 Nonalcoholic steatohepatitis (NASH): Secondary | ICD-10-CM

## 2021-03-13 DIAGNOSIS — J449 Chronic obstructive pulmonary disease, unspecified: Secondary | ICD-10-CM | POA: Diagnosis not present

## 2021-03-13 DIAGNOSIS — I700 Atherosclerosis of aorta: Secondary | ICD-10-CM | POA: Diagnosis not present

## 2021-03-13 DIAGNOSIS — Z299 Encounter for prophylactic measures, unspecified: Secondary | ICD-10-CM | POA: Diagnosis not present

## 2021-03-13 NOTE — Telephone Encounter (Signed)
Chelsea, did you want to refer patient to an obesity clinic. [facility]

## 2021-03-13 NOTE — Telephone Encounter (Signed)
As far as I know, we don't make referrals to [facility]

## 2021-03-13 NOTE — Telephone Encounter (Signed)
Patient calling to check on status of referral for  [facility] that she said was discussed at her last visit in November.

## 2021-03-15 NOTE — Telephone Encounter (Signed)
Left message to return call 

## 2021-03-20 NOTE — Telephone Encounter (Signed)
Left message to return call 

## 2021-03-22 NOTE — Telephone Encounter (Signed)
Discussed with pt and she is going to [facility]

## 2021-03-22 NOTE — Telephone Encounter (Signed)
Left message to return call 

## 2021-03-29 ENCOUNTER — Encounter (INDEPENDENT_AMBULATORY_CARE_PROVIDER_SITE_OTHER): Payer: Self-pay

## 2021-03-29 DIAGNOSIS — K7460 Unspecified cirrhosis of liver: Secondary | ICD-10-CM | POA: Diagnosis not present

## 2021-03-29 DIAGNOSIS — K7581 Nonalcoholic steatohepatitis (NASH): Secondary | ICD-10-CM | POA: Diagnosis not present

## 2021-04-01 LAB — COMPREHENSIVE METABOLIC PANEL
AG Ratio: 1.3 (calc) (ref 1.0–2.5)
ALT: 32 U/L — ABNORMAL HIGH (ref 6–29)
AST: 35 U/L (ref 10–35)
Albumin: 4 g/dL (ref 3.6–5.1)
Alkaline phosphatase (APISO): 96 U/L (ref 37–153)
BUN: 20 mg/dL (ref 7–25)
CO2: 25 mmol/L (ref 20–32)
Calcium: 9.9 mg/dL (ref 8.6–10.4)
Chloride: 106 mmol/L (ref 98–110)
Creat: 0.72 mg/dL (ref 0.60–1.00)
Globulin: 3.1 g/dL (calc) (ref 1.9–3.7)
Glucose, Bld: 111 mg/dL — ABNORMAL HIGH (ref 65–99)
Potassium: 4.2 mmol/L (ref 3.5–5.3)
Sodium: 141 mmol/L (ref 135–146)
Total Bilirubin: 0.4 mg/dL (ref 0.2–1.2)
Total Protein: 7.1 g/dL (ref 6.1–8.1)

## 2021-04-01 LAB — CBC
HCT: 40.7 % (ref 35.0–45.0)
Hemoglobin: 14.1 g/dL (ref 11.7–15.5)
MCH: 34.6 pg — ABNORMAL HIGH (ref 27.0–33.0)
MCHC: 34.6 g/dL (ref 32.0–36.0)
MCV: 100 fL (ref 80.0–100.0)
MPV: 11.9 fL (ref 7.5–12.5)
Platelets: 103 10*3/uL — ABNORMAL LOW (ref 140–400)
RBC: 4.07 10*6/uL (ref 3.80–5.10)
RDW: 13.3 % (ref 11.0–15.0)
WBC: 5 10*3/uL (ref 3.8–10.8)

## 2021-04-01 LAB — PROTIME-INR
INR: 1
Prothrombin Time: 10.4 s (ref 9.0–11.5)

## 2021-04-01 LAB — AFP TUMOR MARKER: AFP-Tumor Marker: 9.3 ng/mL — ABNORMAL HIGH

## 2021-04-01 IMAGING — US US ABDOMEN LIMITED W/ ELASTOGRAPHY
1 series · 12 of 18 positions shown · non-contrast
Comparison: None.

CLINICAL DATA: Evaluate cirrhosis

EXAM:
US ABDOMEN LIMITED - RIGHT UPPER QUADRANT
ULTRASOUND HEPATIC ELASTOGRAPHY
TECHNIQUE: Sonography of the right upper quadrant was performed. In addition,
ultrasound elastography evaluation of the liver was performed. A
region of interest was placed within the right lobe of the liver.
Following application of a compressive sonographic pulse, tissue
compressibility was assessed. Multiple assessments were performed at
the selected site. Median tissue compressibility was determined.
Previously, hepatic stiffness was assessed by shear wave velocity.
Based on recently published Society of Radiologists in Ultrasound
consensus article, reporting is now recommended to be performed in
the SI units of pressure (kiloPascals) representing hepatic
stiffness/elasticity. The obtained result is compared to the
published reference standards. (cACLD = compensated Advanced Chronic
Liver Disease)

[Series 1: abdomen us · 12 of 18 slices shown]
[im 1/18]
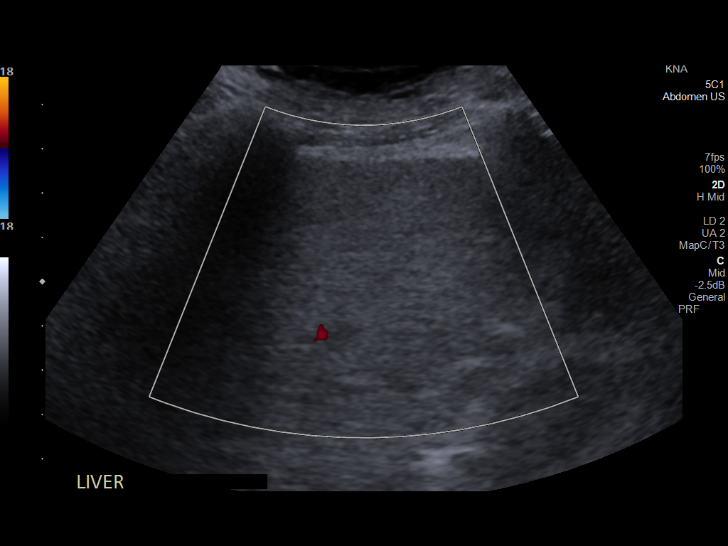
[im 3/18]
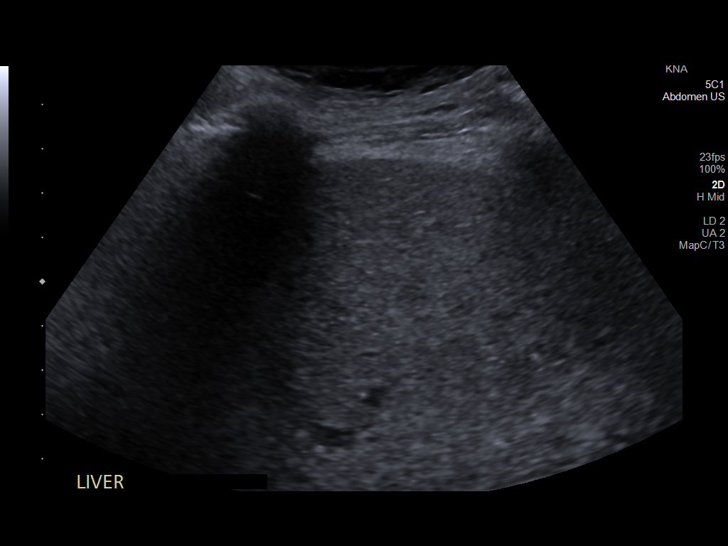
[im 4/18]
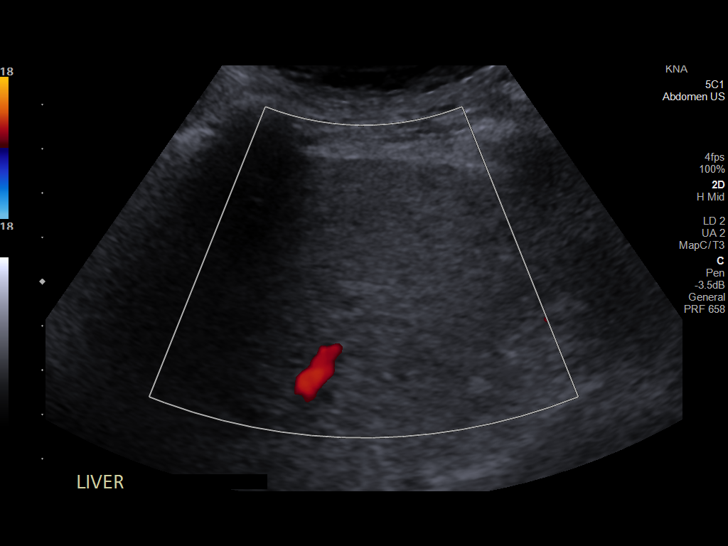
[im 6/18]
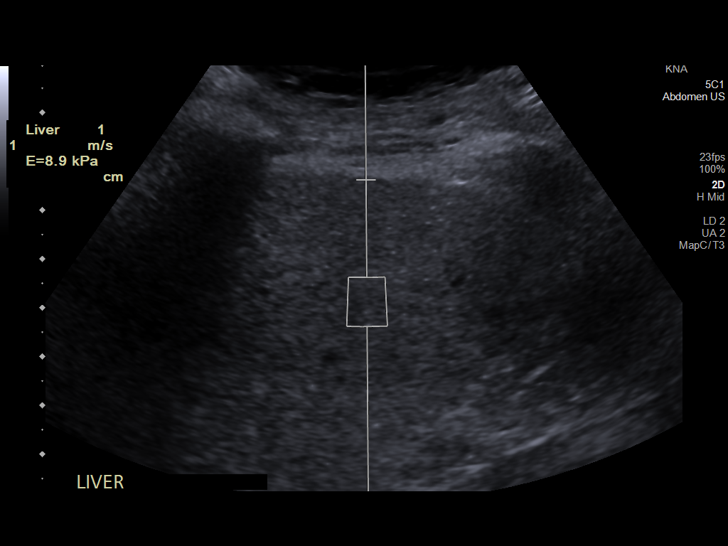
[im 7/18]
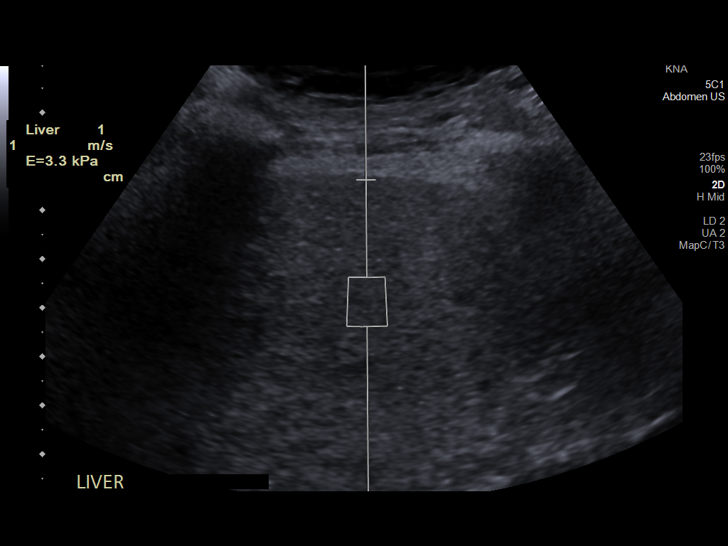
[im 9/18]
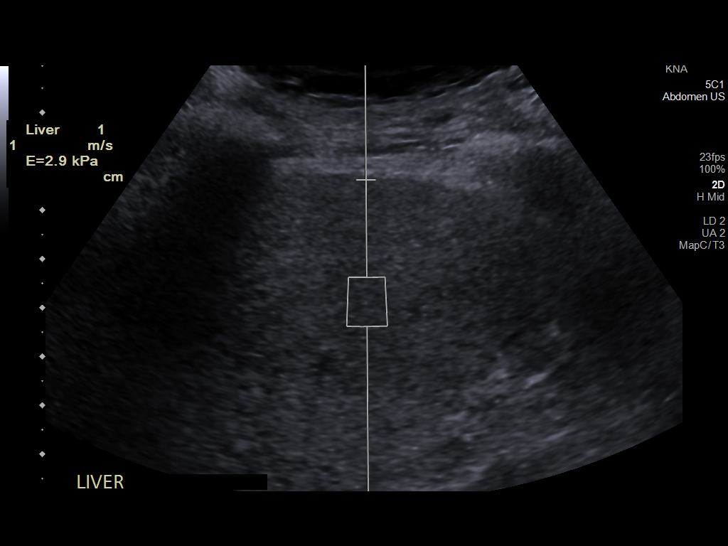
[im 10/18]
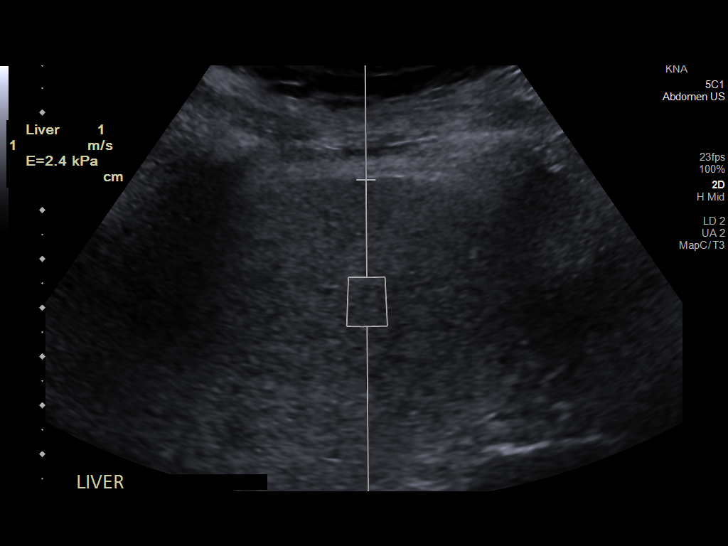
[im 12/18]
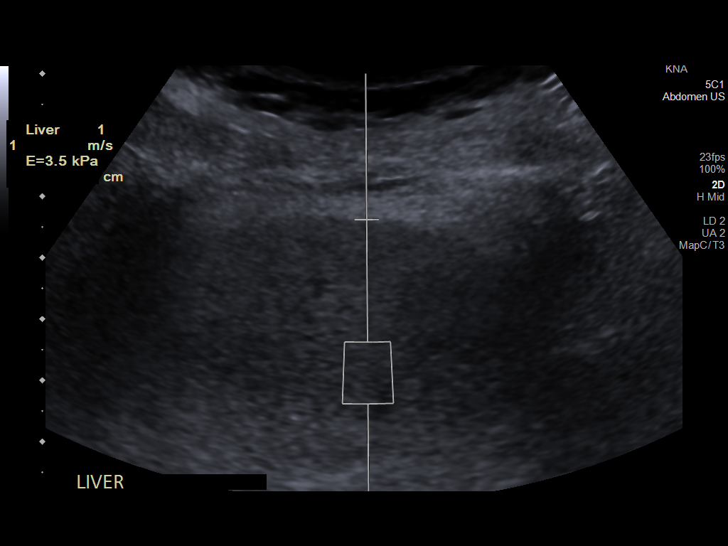
[im 13/18]
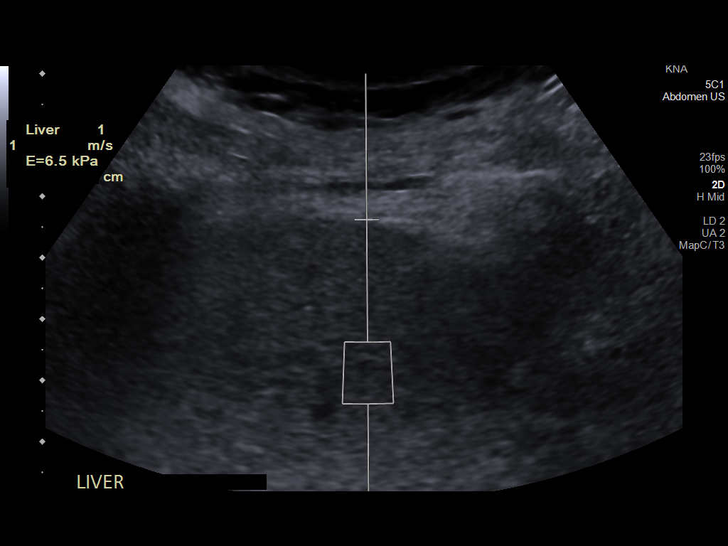
[im 15/18]
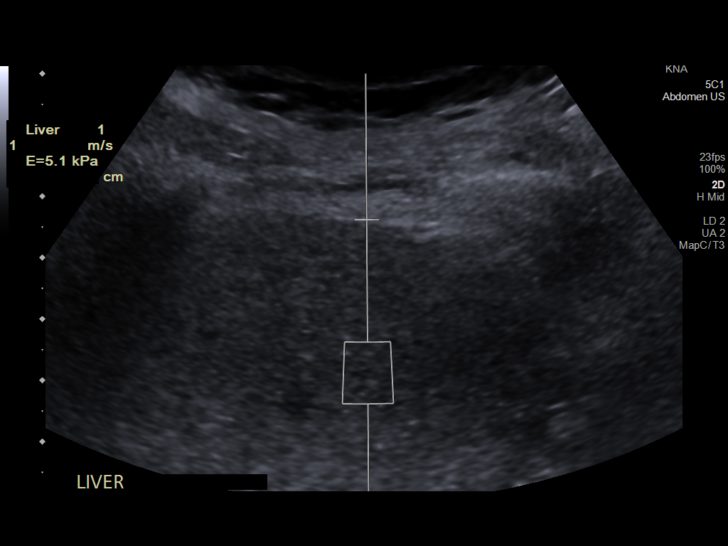
[im 16/18]
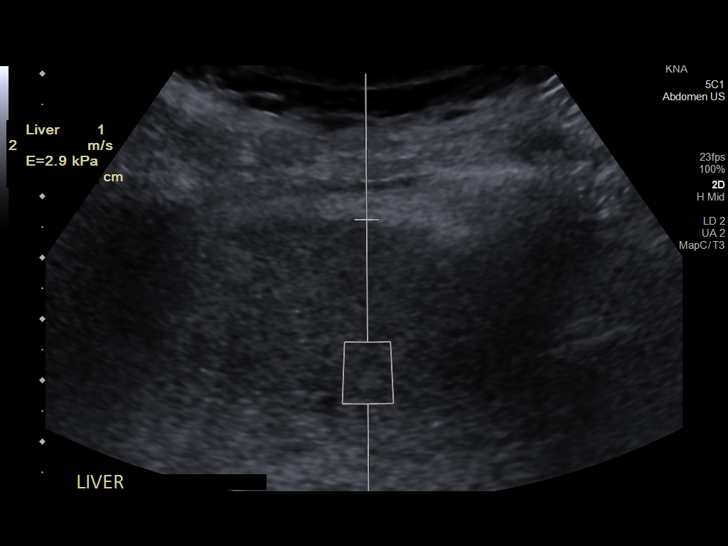
[im 18/18]
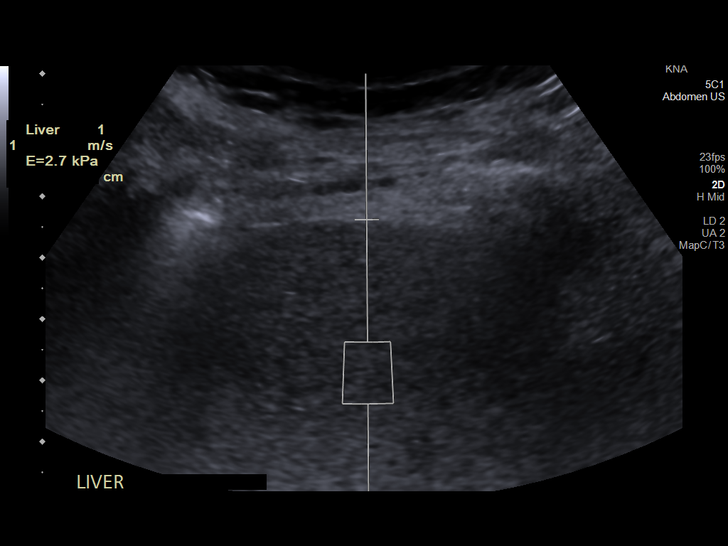

[12 of 18 positions shown; findings below may reference images not displayed]

FINDINGS: ULTRASOUND ABDOMEN LIMITED RIGHT UPPER QUADRANT

Gallbladder:

No gallstones or wall thickening visualized. No sonographic Murphy
sign noted.

Common bile duct:

Diameter: 6 mm

Liver:

No focal lesion identified. Subtly nodular contour of the liver.
Increased parenchymal echogenicity. Portal vein is patent on color
Doppler imaging with normal direction of blood flow towards the
liver.

ULTRASOUND HEPATIC ELASTOGRAPHY

Device: Siemens Helix VTQ

Patient position: Supine

Transducer 5C1

Number of measurements: 10

Hepatic segment:  8

Median kPa:

IQR:

IQR/Median kPa ratio:

Data quality:  Good

Diagnostic category:  < or = 5 kPa: high probability of being normal

The use of hepatic elastography is applicable to patients with viral
hepatitis and non-alcoholic fatty liver disease. At this time, there
is insufficient data for the referenced cut-off values and use in
other causes of liver disease, including alcoholic liver disease.
Patients, however, may be assessed by elastography and serve as
their own reference standard/baseline.

In patients with non-alcoholic liver disease, the values suggesting
compensated advanced chronic liver disease (cACLD) may be lower, and
patients may need additional testing with elasticity results of [DATE]
kPa.

Please note that abnormal hepatic elasticity and shear wave
velocities may also be identified in clinical settings other than
with hepatic fibrosis, such as: acute hepatitis, elevated right
heart and central venous pressures including use of beta blockers,
Xarakterli disease ([place]), infiltrative processes such as
mastocytosis/amyloidosis/infiltrative tumor/lymphoma, extrahepatic
cholestasis, with hyperemia in the post-prandial state, and with
liver transplantation. Correlation with patient history, laboratory
data, and clinical condition recommended.

Diagnostic Categories:

< or =5 kPa: high probability of being normal

< or =9 kPa: in the absence of other known clinical signs, rules [DATE] kPa and ?13 kPa: suggestive of cACLD, but needs further testing

>13 kPa: highly suggestive of cACLD

> or =17 kPa: highly suggestive of cACLD with an increased
probability of clinically significant portal hypertension
IMPRESSION: ULTRASOUND RUQ:

1.  Hepatic steatosis.

2. Subtly nodular contour of the liver, morphology suggestive of
cirrhosis.

ULTRASOUND HEPATIC ELASTOGRAPHY:

Median kPa:

Diagnostic category:  < or = 5 kPa: high probability of being normal

## 2021-04-05 ENCOUNTER — Telehealth: Payer: Self-pay

## 2021-04-05 NOTE — Telephone Encounter (Signed)
LVMT pt requesting call back to discuss referral to PREP at [facility]

## 2021-04-16 DIAGNOSIS — E782 Mixed hyperlipidemia: Secondary | ICD-10-CM | POA: Diagnosis not present

## 2021-04-16 DIAGNOSIS — I10 Essential (primary) hypertension: Secondary | ICD-10-CM | POA: Diagnosis not present

## 2021-05-03 ENCOUNTER — Telehealth (HOSPITAL_COMMUNITY): Payer: Medicare Other | Admitting: Psychiatry

## 2021-05-03 ENCOUNTER — Other Ambulatory Visit: Payer: Self-pay

## 2021-05-03 DIAGNOSIS — I739 Peripheral vascular disease, unspecified: Secondary | ICD-10-CM | POA: Diagnosis not present

## 2021-05-03 DIAGNOSIS — M069 Rheumatoid arthritis, unspecified: Secondary | ICD-10-CM | POA: Diagnosis not present

## 2021-05-03 DIAGNOSIS — I10 Essential (primary) hypertension: Secondary | ICD-10-CM | POA: Diagnosis not present

## 2021-05-03 DIAGNOSIS — Z299 Encounter for prophylactic measures, unspecified: Secondary | ICD-10-CM | POA: Diagnosis not present

## 2021-05-03 DIAGNOSIS — I471 Supraventricular tachycardia: Secondary | ICD-10-CM | POA: Diagnosis not present

## 2021-05-03 DIAGNOSIS — J449 Chronic obstructive pulmonary disease, unspecified: Secondary | ICD-10-CM | POA: Diagnosis not present

## 2021-05-07 ENCOUNTER — Other Ambulatory Visit: Payer: Self-pay

## 2021-05-07 ENCOUNTER — Telehealth (INDEPENDENT_AMBULATORY_CARE_PROVIDER_SITE_OTHER): Payer: Medicare Other | Admitting: Psychiatry

## 2021-05-07 ENCOUNTER — Encounter (HOSPITAL_COMMUNITY): Payer: Self-pay | Admitting: Psychiatry

## 2021-05-07 DIAGNOSIS — F331 Major depressive disorder, recurrent, moderate: Secondary | ICD-10-CM

## 2021-05-07 MED ORDER — ALPRAZOLAM 1 MG PO TABS: 1.0000 mg | ORAL_TABLET | Freq: Four times a day (QID) | ORAL | 3 refills | Status: DC

## 2021-05-07 MED ORDER — FLUOXETINE HCL 40 MG PO CAPS: 40.0000 mg | ORAL_CAPSULE | Freq: Every day | ORAL | 3 refills | Status: DC

## 2021-05-07 MED ORDER — TEMAZEPAM 30 MG PO CAPS: 30.0000 mg | ORAL_CAPSULE | Freq: Every evening | ORAL | 3 refills | Status: DC | PRN

## 2021-05-07 NOTE — Progress Notes (Signed)
Virtual Visit via Telephone Note  I connected with [name] on 05/07/21 at  1:40 PM EST by telephone and verified that I am speaking with the correct person using two identifiers.  Location: Patient: home Provider: office   I discussed the limitations, risks, security and privacy concerns of performing an evaluation and management service by telephone and the availability of in person appointments. I also discussed with the patient that there may be a patient responsible charge related to this service. The patient expressed understanding and agreed to proceed.       I discussed the assessment and treatment plan with the patient. The patient was provided an opportunity to ask questions and all were answered. The patient agreed with the plan and demonstrated an understanding of the instructions.   The patient was advised to call back or seek an in-person evaluation if the symptoms worsen or if the condition fails to improve as anticipated.  I provided 12 minutes of non-face-to-face time during this encounter.   Monique Spiller, MD  [facility] MD/PA/NP OP Progress Note  05/07/2021 1:57 PM [name]  MRN:  [id number]  Chief Complaint:  Chief Complaint  Patient presents with   Depression   Anxiety   Follow-up   HPI: This patient is a 71 year old widowed white female lives alone in [place].  Her daughter and brothers live nearby.  She has 1 son who recently moved back from [place].  She is on disability  The patient returns for follow-up after 3 months.  For the most part she states she has been doing well.  She is still trying to lose weight.  It was suggested she had bariatric surgery but she is scared to do the surgery so she is trying to lose the weight on her own.  So far it is going fairly well.  She is not having as many abdominal problems and she started amitriptyline at bedtime.  It also helps her sleep along with the Restoril.  The patient denies significant depression or  anxiety and feels her current medications are working well for her.  She denies thoughts of self-harm or suicidal ideation Visit Diagnosis:    ICD-10-CM   1. Major depressive disorder, recurrent episode, moderate (HCC)  F33.1       Past Psychiatric History: none  Past Medical History:  Past Medical History:  Diagnosis Date   Anxiety    Arthritis    COPD (chronic obstructive pulmonary disease) ([place])    Depression    Diverticulitis 04/2012   [facility]   Essential hypertension    Frequent PVCs    Outflow tract, positive in the inferior leads   GERD (gastroesophageal reflux disease)    Hepatitis C    Treated   History of pneumonia    Insomnia    Migraine headache    NASH (nonalcoholic steatohepatitis)    Sleep apnea    Splenomegaly    Thyroid disease    Wears glasses     Past Surgical History:  Procedure Laterality Date   ABDOMINAL HYSTERECTOMY     ANKLE SURGERY Right    BIOPSY  10/05/2020   Procedure: BIOPSY;  Surgeon: Harvel Quale, MD;  [facility];  Service: Gastroenterology;;   COLON SURGERY     "part of color removed"   COLONOSCOPY     COLONOSCOPY WITH PROPOFOL N/A 11/13/2020   Castaneda:single non bleeding colonic angiodysplastic lesion, diverticulosis in sigmoid, non bleeding external hemorrhoids, no specimens  ESOPHAGEAL MANOMETRY N/A 04/10/2014   Procedure: ESOPHAGEAL MANOMETRY (EM);  Surgeon: Gatha Mayer, MD;  [facility];  Service: Endoscopy;  Laterality: N/A;   ESOPHAGOGASTRODUODENOSCOPY     ESOPHAGOGASTRODUODENOSCOPY (EGD) WITH PROPOFOL N/A 10/05/2020   Castaneda: barrett's esophagus, short segment, 1cm hh, normal stomach, normal duodenum, biopsied, normal, no varices   INCISIONAL HERNIA REPAIR N/A 10/24/2015   Procedure: LAPAROSCOPIC REPAIR INCISIONAL HERNIA WITH MESH;  Surgeon: Georganna Skeans, MD;  Location: [place];  Service: General;  Laterality: N/A;   INSERTION OF MESH N/A 10/24/2015   Procedure:  INSERTION OF MESH;  Surgeon: Georganna Skeans, MD;  Location: [place];  Service: General;  Laterality: N/A;   JOINT REPLACEMENT     KNEE SURGERY Right    x4   [place] IMPEDANCE STUDY N/A 04/10/2014   Procedure: [place] IMPEDANCE STUDY;  Surgeon: Gatha Mayer, MD;  [facility];  Service: Endoscopy;  Laterality: N/A;   TOTAL SHOULDER ARTHROPLASTY Left 04/22/2018   Procedure: Left total shoulder arthroplasty;  Surgeon: Justice Britain, MD;  [facility];  Service: Orthopedics;  Laterality: Left;  123mn    Family Psychiatric History: see below  Family History:  Family History  Problem Relation Age of Onset   Alcohol abuse Father    Dementia Father    Dementia Maternal Grandmother    Bipolar disorder Daughter    Anxiety disorder Daughter    Depression Brother    Drug abuse Brother    Alcohol abuse Paternal Uncle    Depression Paternal Uncle    ADD / ADHD Neg Hx     Social History:  Social History   Socioeconomic History   Marital status: Widowed    Spouse name: Not on file   Number of children: 2   Years of education: 12th grade   Highest education level: Not on file  Occupational History   Occupation: RETIRED  Tobacco Use   Smoking status: Never   Smokeless tobacco: Never  Vaping Use   Vaping Use: Never used  Substance and Sexual Activity   Alcohol use: Yes    Alcohol/week: 3.0 standard drinks    Types: 3 Cans of beer per week    Comment: occasionally   Drug use: No   Sexual activity: Never  Other Topics Concern   Not on file  Social History Narrative   Not on file   Social Determinants of Health   Financial Resource Strain: Not on file  Food Insecurity: Not on file  Transportation Needs: Not on file  Physical Activity: Not on file  Stress: Not on file  Social Connections: Not on file    Allergies:  Allergies  Allergen Reactions   Levothyroxine Hives and Itching   Tylenol [Acetaminophen] Other (See Comments)    Liver function per MD    Nabumetone  Rash    Metabolic Disorder Labs: No results found for: HGBA1C, MPG No results found for: PROLACTIN No results found for: CHOL, TRIG, HDL, CHOLHDL, VLDL, LDLCALC Lab Results  Component Value Date   TSH 3.26 10/17/2019   TSH 4.540 (H) 10/28/2007    Therapeutic Level Labs: No results found for: LITHIUM No results found for: VALPROATE No components found for:  CBMZ  Current Medications: Current Outpatient Medications  Medication Sig Dispense Refill   albuterol (PROVENTIL HFA;VENTOLIN HFA) 108 (90 Base) MCG/ACT inhaler Inhale 2 puffs into the lungs every 6 (six) hours as needed for wheezing or shortness of breath.     albuterol (PROVENTIL) (2.5 MG/3ML) 0.083%  nebulizer solution Take 2.5 mg by nebulization every 6 (six) hours as needed for wheezing or shortness of breath.     ALPRAZolam (XANAX) 1 MG tablet Take 1 tablet (1 mg total) by mouth 4 (four) times daily. 120 tablet 3   amitriptyline (ELAVIL) 10 MG tablet Take 1 tablet (10 mg total) by mouth at bedtime. 90 tablet 3   Ascorbic Acid (VITAMIN C) 1000 MG tablet Take 1,000 mg by mouth daily.     aspirin EC 81 MG tablet Take 81 mg by mouth daily.     cyanocobalamin 1000 MCG tablet Take 2,000 mcg by mouth daily.     cyclobenzaprine (FLEXERIL) 10 MG tablet Take 10 mg by mouth every 8 (eight) hours as needed for muscle spasms.     cycloSPORINE (RESTASIS) 0.05 % ophthalmic emulsion Place 1 drop into both eyes 2 (two) times daily as needed (dry eyes).     ELDERBERRY PO Take 1 each by mouth daily.     ergocalciferol (VITAMIN D2) 1.25 MG (50000 UT) capsule Take 50,000 Units by mouth 2 (two) times a week.     FLUoxetine (PROZAC) 40 MG capsule Take 1 capsule (40 mg total) by mouth daily. 30 capsule 3   Fluticasone-Umeclidin-Vilant 100-62.5-25 MCG/INH AEPB Inhale 1 puff into the lungs daily as needed (asthma).     gabapentin (NEURONTIN) 100 MG capsule Take 300 mg by mouth at bedtime.      hyoscyamine (LEVSIN) 0.125 MG tablet Take 1 tablet (0.125  mg total) by mouth every 8 (eight) hours as needed (abdominal pain). 30 tablet 2   linaclotide (LINZESS) 290 MCG CAPS capsule Take 1 capsule (290 mcg total) by mouth daily before breakfast. 90 capsule 3   meloxicam (MOBIC) 15 MG tablet Take 1 tablet (15 mg total) by mouth daily. (Patient taking differently: Take 15 mg by mouth at bedtime.) 30 tablet 1   metoprolol succinate (TOPROL-XL) 100 MG 24 hr tablet TAKE ONE TABLET BY MOUTH TWICE DAILY. TAKE WITH OR IMMEDIATELY FOLLOWING A MEAL. (DOSE INCREASE) 180 tablet 1   Multiple Vitamin (MULTI-VITAMIN PO) Take 1 tablet by mouth daily.     nystatin (MYCOSTATIN) powder Apply 1 g topically daily.  2   Omega-3 Fatty Acids (FISH OIL) 1200 MG CAPS Take 2,400 mg by mouth daily.     pantoprazole (PROTONIX) 40 MG tablet Take 1 tablet (40 mg total) by mouth 2 (two) times daily. 120 tablet 5   rosuvastatin (CRESTOR) 10 MG tablet Take 10 mg by mouth daily.     Simethicone (GAS RELIEF PO) Take 2 capsules by mouth as needed (flatulence).      temazepam (RESTORIL) 30 MG capsule Take 1 capsule (30 mg total) by mouth at bedtime as needed. for sleep 30 capsule 3   traMADol (ULTRAM) 50 MG tablet Take 50 mg by mouth 3 (three) times daily as needed for severe pain.     No current facility-administered medications for this visit.     Musculoskeletal: Strength & Muscle Tone: na Gait & Station: na Patient leans: N/A  Psychiatric Specialty Exam: Review of Systems  Musculoskeletal:  Positive for arthralgias and joint swelling.  All other systems reviewed and are negative.  There were no vitals taken for this visit.There is no height or weight on file to calculate BMI.  General Appearance: NA  Eye Contact:  NA  Speech:  Clear and Coherent  Volume:  Normal  Mood:  Euthymic  Affect:  NA  Thought Process:  Goal Directed  Orientation:  Full (Time, Place, and Person)  Thought Content: WDL   Suicidal Thoughts:  No  Homicidal Thoughts:  No  Memory:  Immediate;    Good Recent;   Good Remote;   Fair  Judgement:  Good  Insight:  Fair  Psychomotor Activity:  Decreased  Concentration:  Concentration: Good and Attention Span: Good  Recall:  Good  Fund of Knowledge: Good  Language: Good  Akathisia:  No  Handed:  Right  AIMS (if indicated): not done  Assets:  Communication Skills Desire for Improvement Resilience Social Support Talents/Skills  ADL's:  Intact  Cognition: WNL  Sleep:  Good   Screenings: PHQ2-9    Flowsheet Row Video Visit from 05/07/2021 in [place] Video Visit from 12/31/2020 in [facility] Video Visit from 08/27/2020 in [facility] Nutrition from 06/18/2020 in [facility]  PHQ-2 Total Score 1 0 1 0      Flowsheet Row Video Visit from 05/07/2021 in [facility] Video Visit from 12/31/2020 in [facility] Admission (Discharged) from 11/13/2020 in [place] No Risk No Risk No Risk        Assessment and Plan: This patient is a 71 year old female with a history of depression and anxiety.  She continues to do well on her current regimen.  She will continue Prozac 40 mg daily for depression, Xanax 1 mg 4 times daily for anxiety and Restoril 30 mg at bedtime for sleep.  She will return to see me in 3 months  Collaboration of Care: Collaboration of Care: Primary Care Provider AEB chart notes will be made available to PCP at patient's request  Patient/Guardian was advised Release of Information must be obtained prior to any record release in order to collaborate their care with an outside provider. Patient/Guardian was advised if they have not already done so to contact the registration department to sign all necessary forms in order for [place] to release information  regarding their care.   Consent: Patient/Guardian gives verbal consent for treatment and assignment of benefits for services provided during this visit. Patient/Guardian expressed understanding and agreed to proceed.    Monique Spiller, MD 05/07/2021, 1:57 PM

## 2021-05-09 ENCOUNTER — Other Ambulatory Visit: Payer: Self-pay | Admitting: Cardiology

## 2021-05-29 ENCOUNTER — Other Ambulatory Visit: Payer: Self-pay | Admitting: Internal Medicine

## 2021-05-29 DIAGNOSIS — Z1231 Encounter for screening mammogram for malignant neoplasm of breast: Secondary | ICD-10-CM

## 2021-06-11 DIAGNOSIS — I10 Essential (primary) hypertension: Secondary | ICD-10-CM | POA: Diagnosis not present

## 2021-06-11 DIAGNOSIS — M1990 Unspecified osteoarthritis, unspecified site: Secondary | ICD-10-CM | POA: Diagnosis not present

## 2021-06-11 DIAGNOSIS — Z7189 Other specified counseling: Secondary | ICD-10-CM | POA: Diagnosis not present

## 2021-06-11 DIAGNOSIS — Z0000 Encounter for general adult medical examination without abnormal findings: Secondary | ICD-10-CM | POA: Diagnosis not present

## 2021-06-11 DIAGNOSIS — Z299 Encounter for prophylactic measures, unspecified: Secondary | ICD-10-CM | POA: Diagnosis not present

## 2021-06-13 DIAGNOSIS — I10 Essential (primary) hypertension: Secondary | ICD-10-CM | POA: Diagnosis not present

## 2021-06-13 DIAGNOSIS — J449 Chronic obstructive pulmonary disease, unspecified: Secondary | ICD-10-CM | POA: Diagnosis not present

## 2021-06-13 DIAGNOSIS — H2513 Age-related nuclear cataract, bilateral: Secondary | ICD-10-CM | POA: Diagnosis not present

## 2021-07-14 DIAGNOSIS — E782 Mixed hyperlipidemia: Secondary | ICD-10-CM | POA: Diagnosis not present

## 2021-07-14 DIAGNOSIS — I10 Essential (primary) hypertension: Secondary | ICD-10-CM | POA: Diagnosis not present

## 2021-07-25 ENCOUNTER — Ambulatory Visit (INDEPENDENT_AMBULATORY_CARE_PROVIDER_SITE_OTHER): Payer: Medicare Other | Admitting: Gastroenterology

## 2021-07-29 ENCOUNTER — Telehealth (INDEPENDENT_AMBULATORY_CARE_PROVIDER_SITE_OTHER): Payer: Medicare Other | Admitting: Psychiatry

## 2021-07-29 ENCOUNTER — Encounter (HOSPITAL_COMMUNITY): Payer: Self-pay | Admitting: Psychiatry

## 2021-07-29 DIAGNOSIS — F331 Major depressive disorder, recurrent, moderate: Secondary | ICD-10-CM

## 2021-07-29 MED ORDER — FLUOXETINE HCL 40 MG PO CAPS: 40.0000 mg | ORAL_CAPSULE | Freq: Every day | ORAL | 3 refills | Status: DC

## 2021-07-29 MED ORDER — ALPRAZOLAM 1 MG PO TABS: 1.0000 mg | ORAL_TABLET | Freq: Four times a day (QID) | ORAL | 3 refills | Status: DC

## 2021-07-29 MED ORDER — TEMAZEPAM 30 MG PO CAPS: 30.0000 mg | ORAL_CAPSULE | Freq: Every evening | ORAL | 3 refills | Status: DC | PRN

## 2021-07-29 NOTE — Progress Notes (Signed)
Virtual Visit via Telephone Note ? ?I connected with [name] on 07/29/21 at  2:00 PM EDT by telephone and verified that I am speaking with the correct person using two identifiers. ? ?Location: ?Patient: home ?Provider: office ?  ?I discussed the limitations, risks, security and privacy concerns of performing an evaluation and management service by telephone and the availability of in person appointments. I also discussed with the patient that there may be a patient responsible charge related to this service. The patient expressed understanding and agreed to proceed. ? ? ? ?  ?I discussed the assessment and treatment plan with the patient. The patient was provided an opportunity to ask questions and all were answered. The patient agreed with the plan and demonstrated an understanding of the instructions. ?  ?The patient was advised to call back or seek an in-person evaluation if the symptoms worsen or if the condition fails to improve as anticipated. ? ?I provided 15 minutes of non-face-to-face time during this encounter. ? ? ?[name], MD ? ?[facility] MD/PA/NP OP Progress Note ? ?07/29/2021 2:15 PM ?[name]  ?MRN:  [id number] ? ?Chief Complaint:  ?Chief Complaint  ?Patient presents with  ? Depression  ? Anxiety  ? Follow-up  ? ?HPI: This patient is a 71 year old widowed white female who lives alone in [place].  Her daughter and brothers live nearby.  She has 1 son who recently moved back from [place].  She is on disability. ? ?The patient returns for follow-up after 3 months.  She states that her primary doctor diagnosed her with iron deficiency anemia.  She states that she is tired all the time and her fingers are peeling.  However labs in our system do not show diminished treatment of hemoglobin or hematocrit or low ferritin or iron.  Nevertheless she is going to be seeing a GI specialist here next week. ? ?In terms of mood she states she is doing pretty well.  She denies significant depression although she  is tired of not feeling well.  She denies any thoughts of self-harm or suicidal ideation or anxiety.  She states that she is sleeping well.  She thinks her medications are working well for her condition. ?Visit Diagnosis:  ?  ICD-10-CM   ?1. Major depressive disorder, recurrent episode, moderate (HCC)  F33.1   ?  ? ? ?Past Psychiatric History: none ? ?Past Medical History:  ?Past Medical History:  ?Diagnosis Date  ? Anxiety   ? Arthritis   ? COPD (chronic obstructive pulmonary disease) ([place])   ? Depression   ? Diverticulitis 04/2012  ? [facility]  ? Essential hypertension   ? Frequent PVCs   ? Outflow tract, positive in the inferior leads  ? GERD (gastroesophageal reflux disease)   ? Hepatitis C   ? Treated  ? History of pneumonia   ? Insomnia   ? Migraine headache   ? NASH (nonalcoholic steatohepatitis)   ? Sleep apnea   ? Splenomegaly   ? Thyroid disease   ? Wears glasses   ?  ?Past Surgical History:  ?Procedure Laterality Date  ? ABDOMINAL HYSTERECTOMY    ? ANKLE SURGERY Right   ? BIOPSY  10/05/2020  ? Procedure: BIOPSY;  Surgeon: Harvel Quale, MD;  [facility];  Service: Gastroenterology;;  ? COLON SURGERY    ? "part of color removed"  ? COLONOSCOPY    ? COLONOSCOPY WITH PROPOFOL N/A 11/13/2020  ? Castaneda:single non bleeding colonic angiodysplastic lesion,  diverticulosis in sigmoid, non bleeding external hemorrhoids, no specimens  ? ESOPHAGEAL MANOMETRY N/A 04/10/2014  ? Procedure: ESOPHAGEAL MANOMETRY (EM);  Surgeon: Gatha Mayer, MD;  [facility];  Service: Endoscopy;  Laterality: N/A;  ? ESOPHAGOGASTRODUODENOSCOPY    ? ESOPHAGOGASTRODUODENOSCOPY (EGD) WITH PROPOFOL N/A 10/05/2020  ? Castaneda: barrett's esophagus, short segment, 1cm hh, normal stomach, normal duodenum, biopsied, normal, no varices  ? INCISIONAL HERNIA REPAIR N/A 10/24/2015  ? Procedure: LAPAROSCOPIC REPAIR INCISIONAL HERNIA WITH MESH;  Surgeon: Georganna Skeans, MD;  Location: [place];  Service:  General;  Laterality: N/A;  ? INSERTION OF MESH N/A 10/24/2015  ? Procedure: INSERTION OF MESH;  Surgeon: Georganna Skeans, MD;  Location: [place];  Service: General;  Laterality: N/A;  ? JOINT REPLACEMENT    ? KNEE SURGERY Right   ? x4  ? [place] IMPEDANCE STUDY N/A 04/10/2014  ? Procedure: [place] IMPEDANCE STUDY;  Surgeon: Gatha Mayer, MD;  [facility];  Service: Endoscopy;  Laterality: N/A;  ? TOTAL SHOULDER ARTHROPLASTY Left 04/22/2018  ? Procedure: Left total shoulder arthroplasty;  Surgeon: Justice Britain, MD;  [facility];  Service: Orthopedics;  Laterality: Left;  14mn  ? ? ?Family Psychiatric History: see below ? ?Family History:  ?Family History  ?Problem Relation Age of Onset  ? Alcohol abuse Father   ? Dementia Father   ? Dementia Maternal Grandmother   ? Bipolar disorder Daughter   ? Anxiety disorder Daughter   ? Depression Brother   ? Drug abuse Brother   ? Alcohol abuse Paternal Uncle   ? Depression Paternal Uncle   ? ADD / ADHD Neg Hx   ? ? ?Social History:  ?Social History  ? ?Socioeconomic History  ? Marital status: Widowed  ?  Spouse name: Not on file  ? Number of children: 2  ? Years of education: 12th grade  ? Highest education level: Not on file  ?Occupational History  ? Occupation: RETIRED  ?Tobacco Use  ? Smoking status: Never  ? Smokeless tobacco: Never  ?Vaping Use  ? Vaping Use: Never used  ?Substance and Sexual Activity  ? Alcohol use: Yes  ?  Alcohol/week: 3.0 standard drinks  ?  Types: 3 Cans of beer per week  ?  Comment: occasionally  ? Drug use: No  ? Sexual activity: Never  ?Other Topics Concern  ? Not on file  ?Social History Narrative  ? Not on file  ? ?Social Determinants of Health  ? ?Financial Resource Strain: Not on file  ?Food Insecurity: Not on file  ?Transportation Needs: Not on file  ?Physical Activity: Not on file  ?Stress: Not on file  ?Social Connections: Not on file  ? ? ?Allergies:  ?Allergies  ?Allergen Reactions  ? Levothyroxine Hives and Itching  ? Tylenol  [Acetaminophen] Other (See Comments)  ?  Liver function per MD   ? Nabumetone Rash  ? ? ?Metabolic Disorder Labs: ?No results found for: HGBA1C, MPG ?No results found for: PROLACTIN ?No results found for: CHOL, TRIG, HDL, CHOLHDL, VLDL, LDLCALC ?Lab Results  ?Component Value Date  ? TSH 3.26 10/17/2019  ? TSH 4.540 (H) 10/28/2007  ? ? ?Therapeutic Level Labs: ?No results found for: LITHIUM ?No results found for: VALPROATE ?No components found for:  CBMZ ? ?Current Medications: ?Current Outpatient Medications  ?Medication Sig Dispense Refill  ? albuterol (PROVENTIL HFA;VENTOLIN HFA) 108 (90 Base) MCG/ACT inhaler Inhale 2 puffs into the lungs every 6 (six) hours as needed for wheezing or shortness  of breath.    ? albuterol (PROVENTIL) (2.5 MG/3ML) 0.083% nebulizer solution Take 2.5 mg by nebulization every 6 (six) hours as needed for wheezing or shortness of breath.    ? ALPRAZolam (XANAX) 1 MG tablet Take 1 tablet (1 mg total) by mouth 4 (four) times daily. 120 tablet 3  ? amitriptyline (ELAVIL) 10 MG tablet Take 1 tablet (10 mg total) by mouth at bedtime. 90 tablet 3  ? Ascorbic Acid (VITAMIN C) 1000 MG tablet Take 1,000 mg by mouth daily.    ? aspirin EC 81 MG tablet Take 81 mg by mouth daily.    ? cyanocobalamin 1000 MCG tablet Take 2,000 mcg by mouth daily.    ? cyclobenzaprine (FLEXERIL) 10 MG tablet Take 10 mg by mouth every 8 (eight) hours as needed for muscle spasms.    ? cycloSPORINE (RESTASIS) 0.05 % ophthalmic emulsion Place 1 drop into both eyes 2 (two) times daily as needed (dry eyes).    ? ELDERBERRY PO Take 1 each by mouth daily.    ? ergocalciferol (VITAMIN D2) 1.25 MG (50000 UT) capsule Take 50,000 Units by mouth 2 (two) times a week.    ? FLUoxetine (PROZAC) 40 MG capsule Take 1 capsule (40 mg total) by mouth daily. 30 capsule 3  ? Fluticasone-Umeclidin-Vilant 100-62.5-25 MCG/INH AEPB Inhale 1 puff into the lungs daily as needed (asthma).    ? gabapentin (NEURONTIN) 100 MG capsule Take 300 mg by  mouth at bedtime.     ? hyoscyamine (LEVSIN) 0.125 MG tablet Take 1 tablet (0.125 mg total) by mouth every 8 (eight) hours as needed (abdominal pain). 30 tablet 2  ? linaclotide (LINZESS) 290 MCG CAPS ca

## 2021-07-31 ENCOUNTER — Ambulatory Visit (INDEPENDENT_AMBULATORY_CARE_PROVIDER_SITE_OTHER): Payer: Medicare Other

## 2021-07-31 ENCOUNTER — Encounter: Payer: Self-pay | Admitting: Cardiology

## 2021-07-31 ENCOUNTER — Other Ambulatory Visit: Payer: Self-pay | Admitting: Cardiology

## 2021-07-31 ENCOUNTER — Ambulatory Visit (INDEPENDENT_AMBULATORY_CARE_PROVIDER_SITE_OTHER): Payer: Medicare Other | Admitting: Cardiology

## 2021-07-31 VITALS — BP 126/80 | HR 69 | Ht 63.0 in | Wt 219.6 lb

## 2021-07-31 DIAGNOSIS — I493 Ventricular premature depolarization: Secondary | ICD-10-CM

## 2021-07-31 DIAGNOSIS — I10 Essential (primary) hypertension: Secondary | ICD-10-CM

## 2021-07-31 DIAGNOSIS — E782 Mixed hyperlipidemia: Secondary | ICD-10-CM | POA: Diagnosis not present

## 2021-07-31 NOTE — Patient Instructions (Addendum)
Medication Instructions:  ?Your physician recommends that you continue on your current medications as directed. Please refer to the Current Medication list given to you today. ? ?Labwork: ?none ? ?Testing/Procedures: ?Your physician has recommended that you wear a Zio monitor.  ? ?This monitor is a medical device that records the heart?s electrical activity. Doctors most often use these monitors to diagnose arrhythmias. Arrhythmias are problems with the speed or rhythm of the heartbeat. The monitor is a small device applied to your chest. You can wear one while you do your normal daily activities. While wearing this monitor if you have any symptoms to push the button and record what you felt. Once you have worn this monitor for the period of time provider prescribed (for 7 days), you will return the monitor device in the postage paid box. Once it is returned they will download the data collected and provide [place] with a report which the provider will then review and we will call you with those results. Important tips: ? ?Avoid showering during the first 24 hours of wearing the monitor. ?Avoid excessive sweating to help maximize wear time. ?Do not submerge the device, no hot tubs, and no swimming pools. ?Keep any lotions or oils away from the patch. ?After 24 hours you may shower with the patch on. Take brief showers with your back facing the shower head.  ?Do not remove patch once it has been placed because that will interrupt data and decrease adhesive wear time. ?Push the button when you have any symptoms and write down what you were feeling. ?Once you have completed wearing your monitor, remove and place into box which has postage paid and place in your outgoing mailbox.  ?If for some reason you have misplaced your box then call our office and we can provide another box and/or mail it off for you. ? ?Follow-Up: ?Your physician recommends that you schedule a follow-up appointment in: 6 months ? ?Any Other Special  Instructions Will Be Listed Below (If Applicable). ? ?If you need a refill on your cardiac medications before your next appointment, please call your pharmacy. ?

## 2021-07-31 NOTE — Progress Notes (Signed)
? ? ?Cardiology Office Note ? ?Date: 07/31/2021  ? ?ID: [name], DOB September 05, 1950, MRN [id number] ? ?PCP:  Glenda Chroman, MD  ?Cardiologist:  Rozann Lesches, MD ?Electrophysiologist:  None  ? ?Chief Complaint  ?Patient presents with  ? Cardiac follow-up  ? ? ?History of Present Illness: ?[name] is a 71 y.o. female last seen in October 2022.  She is here for a follow-up visit.  Reports occasional, sharp, atypical chest discomfort that has been sporadic.  No definite sense of palpitations.  She also reports intermittent feelings of lightheadedness, no frank syncope.  She has had some falls related to right knee pain and weakness.  She is wearing a brace on her right knee today. ? ?She does have a history of frequent PVCs, likely outflow tract based on positive inferior axis.  Echocardiogram done last year revealed normal LVEF at 55 to 60%.  No definite history of ischemic heart disease. ? ?I reviewed her medications which are noted below.  She remains on high-dose Toprol-XL. ? ?Past Medical History:  ?Diagnosis Date  ? Anxiety   ? Arthritis   ? COPD (chronic obstructive pulmonary disease) ([place])   ? Depression   ? Diverticulitis 04/2012  ? [facility]  ? Essential hypertension   ? Frequent PVCs   ? Outflow tract, positive in the inferior leads  ? GERD (gastroesophageal reflux disease)   ? Hepatitis C   ? Treated  ? History of pneumonia   ? Insomnia   ? Migraine headache   ? NASH (nonalcoholic steatohepatitis)   ? Sleep apnea   ? Splenomegaly   ? Thyroid disease   ? Wears glasses   ? ? ?Past Surgical History:  ?Procedure Laterality Date  ? ABDOMINAL HYSTERECTOMY    ? ANKLE SURGERY Right   ? BIOPSY  10/05/2020  ? Procedure: BIOPSY;  Surgeon: Harvel Quale, MD;  [facility];  Service: Gastroenterology;;  ? COLON SURGERY    ? "part of color removed"  ? COLONOSCOPY    ? COLONOSCOPY WITH PROPOFOL N/A 11/13/2020  ? Castaneda:single non bleeding colonic angiodysplastic lesion,  diverticulosis in sigmoid, non bleeding external hemorrhoids, no specimens  ? ESOPHAGEAL MANOMETRY N/A 04/10/2014  ? Procedure: ESOPHAGEAL MANOMETRY (EM);  Surgeon: Gatha Mayer, MD;  [facility];  Service: Endoscopy;  Laterality: N/A;  ? ESOPHAGOGASTRODUODENOSCOPY    ? ESOPHAGOGASTRODUODENOSCOPY (EGD) WITH PROPOFOL N/A 10/05/2020  ? Castaneda: barrett's esophagus, short segment, 1cm hh, normal stomach, normal duodenum, biopsied, normal, no varices  ? INCISIONAL HERNIA REPAIR N/A 10/24/2015  ? Procedure: LAPAROSCOPIC REPAIR INCISIONAL HERNIA WITH MESH;  Surgeon: Georganna Skeans, MD;  Location: [place];  Service: General;  Laterality: N/A;  ? INSERTION OF MESH N/A 10/24/2015  ? Procedure: INSERTION OF MESH;  Surgeon: Georganna Skeans, MD;  Location: [place];  Service: General;  Laterality: N/A;  ? JOINT REPLACEMENT    ? KNEE SURGERY Right   ? x4  ? [place] IMPEDANCE STUDY N/A 04/10/2014  ? Procedure: [place] IMPEDANCE STUDY;  Surgeon: Gatha Mayer, MD;  [facility];  Service: Endoscopy;  Laterality: N/A;  ? TOTAL SHOULDER ARTHROPLASTY Left 04/22/2018  ? Procedure: Left total shoulder arthroplasty;  Surgeon: Justice Britain, MD;  [facility];  Service: Orthopedics;  Laterality: Left;  164mn  ? ? ?Current Outpatient Medications  ?Medication Sig Dispense Refill  ? albuterol (PROVENTIL HFA;VENTOLIN HFA) 108 (90 Base) MCG/ACT inhaler Inhale 2 puffs into the lungs every 6 (six) hours  as needed for wheezing or shortness of breath.    ? albuterol (PROVENTIL) (2.5 MG/3ML) 0.083% nebulizer solution Take 2.5 mg by nebulization every 6 (six) hours as needed for wheezing or shortness of breath.    ? ALPRAZolam (XANAX) 1 MG tablet Take 1 tablet (1 mg total) by mouth 4 (four) times daily. 120 tablet 3  ? amitriptyline (ELAVIL) 10 MG tablet Take 1 tablet (10 mg total) by mouth at bedtime. 90 tablet 3  ? Ascorbic Acid (VITAMIN C) 1000 MG tablet Take 1,000 mg by mouth daily.    ? aspirin EC 81 MG tablet Take 81 mg by  mouth daily.    ? cyanocobalamin 1000 MCG tablet Take 2,000 mcg by mouth daily.    ? cyclobenzaprine (FLEXERIL) 10 MG tablet Take 10 mg by mouth every 8 (eight) hours as needed for muscle spasms.    ? cycloSPORINE (RESTASIS) 0.05 % ophthalmic emulsion Place 1 drop into both eyes 2 (two) times daily as needed (dry eyes).    ? ELDERBERRY PO Take 1 each by mouth daily.    ? ergocalciferol (VITAMIN D2) 1.25 MG (50000 UT) capsule Take 50,000 Units by mouth 2 (two) times a week.    ? FLUoxetine (PROZAC) 40 MG capsule Take 1 capsule (40 mg total) by mouth daily. 30 capsule 3  ? Fluticasone-Umeclidin-Vilant 100-62.5-25 MCG/INH AEPB Inhale 1 puff into the lungs daily as needed (asthma).    ? gabapentin (NEURONTIN) 100 MG capsule Take 300 mg by mouth 3 (three) times daily.    ? hyoscyamine (LEVSIN) 0.125 MG tablet Take 1 tablet (0.125 mg total) by mouth every 8 (eight) hours as needed (abdominal pain). 30 tablet 2  ? linaclotide (LINZESS) 290 MCG CAPS capsule Take 1 capsule (290 mcg total) by mouth daily before breakfast. 90 capsule 3  ? meloxicam (MOBIC) 15 MG tablet Take 1 tablet (15 mg total) by mouth daily. (Patient taking differently: Take 15 mg by mouth at bedtime.) 30 tablet 1  ? metoprolol succinate (TOPROL-XL) 100 MG 24 hr tablet TAKE ONE TABLET BY MOUTH TWICE DAILY WITH OR IMMEDIATELY FOLLOWING A MEAL. 180 tablet 1  ? Multiple Vitamin (MULTI-VITAMIN PO) Take 1 tablet by mouth daily.    ? nystatin (MYCOSTATIN) powder Apply 1 g topically daily.  2  ? Omega-3 Fatty Acids (FISH OIL) 1200 MG CAPS Take 2,400 mg by mouth daily.    ? pantoprazole (PROTONIX) 40 MG tablet Take 1 tablet (40 mg total) by mouth 2 (two) times daily. 120 tablet 5  ? rosuvastatin (CRESTOR) 10 MG tablet Take 10 mg by mouth daily.    ? Simethicone (GAS RELIEF PO) Take 2 capsules by mouth as needed (flatulence).     ? temazepam (RESTORIL) 30 MG capsule Take 1 capsule (30 mg total) by mouth at bedtime as needed. for sleep 30 capsule 3  ? traMADol  (ULTRAM) 50 MG tablet Take 50 mg by mouth 3 (three) times daily as needed for severe pain.    ? ?No current facility-administered medications for this visit.  ? ?Allergies:  Levothyroxine, Tylenol [acetaminophen], and Nabumetone  ? ?ROS: No orthopnea or PND. ? ?Physical Exam: ?VS:  BP 126/80   Pulse 69   Ht 5' 3"  (1.6 m)   Wt 219 lb 9.6 oz (99.6 kg)   SpO2 94%   BMI 38.90 kg/m? , BMI Body mass index is 38.9 kg/m?. ? ?Wt Readings from Last 3 Encounters:  ?07/31/21 219 lb 9.6 oz (99.6 kg)  ?01/24/21 210 lb  6.4 oz (95.4 kg)  ?01/16/21 212 lb 3.2 oz (96.3 kg)  ?  ?General: Patient appears comfortable at rest. ?HEENT: Conjunctiva and lids normal. ?Neck: Supple, no elevated JVP or carotid bruits, no thyromegaly. ?Lungs: Clear to auscultation, nonlabored breathing at rest. ?Cardiac: Regular rate and rhythm with intermittent ectopy, no S3, 2/6 systolic murmur, no pericardial rub. ?Extremities: No pitting edema. ? ?ECG:  An ECG dated 05/09/2020 was personally reviewed today and demonstrated:  Sinus rhythm with frequent PVCs, upright in the inferior leads. ? ?Recent Labwork: ?03/29/2021: ALT 32; AST 35; BUN 20; Creat 0.72; Hemoglobin 14.1; Platelets 103; Potassium 4.2; Sodium 141  ? ?Other Studies Reviewed Today: ? ?Lexiscan Myoview 02/09/2019: ?There was no ST segment deviation noted during stress. ?Normal myocardial perfusion. Extensive soft tissue attenuation noted. ?This is an intermediate risk study, primarily based on reduced LVEF. However, LV function appears grossly normal and the reduced LVEF may be due to gating artifact. If echocardiogram is performed and LV systolic function is found to be normal, this would be considered a low risk study. ?Nuclear stress EF: 37%. ?  ?Echocardiogram 05/25/2020: ? 1. Left ventricular ejection fraction, by estimation, is 55 to 60%. The  ?left ventricle has normal function. The left ventricle has no regional  ?wall motion abnormalities. Left ventricular diastolic parameters are   ?consistent with Grade II diastolic  ?dysfunction (pseudonormalization).  ? 2. Right ventricular systolic function is normal. The right ventricular  ?size is normal. There is normal pulmonary artery systolic pressure. Terie Purser

## 2021-08-01 ENCOUNTER — Ambulatory Visit (INDEPENDENT_AMBULATORY_CARE_PROVIDER_SITE_OTHER): Payer: Medicare Other | Admitting: Gastroenterology

## 2021-08-01 ENCOUNTER — Encounter (INDEPENDENT_AMBULATORY_CARE_PROVIDER_SITE_OTHER): Payer: Self-pay | Admitting: Gastroenterology

## 2021-08-01 VITALS — BP 147/77 | HR 56 | Temp 98.2°F | Ht 63.0 in | Wt 218.5 lb

## 2021-08-01 DIAGNOSIS — K219 Gastro-esophageal reflux disease without esophagitis: Secondary | ICD-10-CM

## 2021-08-01 DIAGNOSIS — K2270 Barrett's esophagus without dysplasia: Secondary | ICD-10-CM

## 2021-08-01 DIAGNOSIS — K581 Irritable bowel syndrome with constipation: Secondary | ICD-10-CM

## 2021-08-01 DIAGNOSIS — Z8619 Personal history of other infectious and parasitic diseases: Secondary | ICD-10-CM | POA: Diagnosis not present

## 2021-08-01 DIAGNOSIS — K7581 Nonalcoholic steatohepatitis (NASH): Secondary | ICD-10-CM | POA: Diagnosis not present

## 2021-08-01 MED ORDER — OMEPRAZOLE 40 MG PO CPDR: 40.0000 mg | DELAYED_RELEASE_CAPSULE | Freq: Every day | ORAL | 3 refills | Status: DC

## 2021-08-01 NOTE — Progress Notes (Signed)
Referring Provider: Glenda Chroman, MD Primary Care Physician:  Glenda Chroman, MD Primary GI Physician: Jenetta Downer  Chief Complaint  Patient presents with   Constipation    6 month follow up on constipation. Taking linzess 290 mcg every other day. Stool was runny when taking daily. Has one stool a day.    HPI:   [name] is a 71 y.o. female with past medical history of  Hep C s/p treatment, in remission, anxiety, IBS-C, RA, COPD, GERD, opioid induced constipation, HTN, hypothyroidism, SVT, recurrent diverticulitis s/p partial colectomy and NASH with advanced fibrosis.   Patient presenting today for follow up of constipation/NASH with 3/4 fibrosis/GERD.  History: Patient had previous blood work on 7/12 during hospital visit for her LUQ pain that revealed AST 65, ALT 47, Alk phos 86, plt 99. CT A/P with contrast at that time showed presence of mild left sided diverticulosis, steatosis and cirrhotic morphology of liver with worsening splenomegaly with 16cm in size. Notably, 05/18/20, she had normal elastography, normal IgG, INR, CBC with plt 130, AFP 8.1, neg ASMA, AMA and normal iron stores with undetectable HCVRNA. Patient underwent liver biopsy in august 2022 for further evaluation that revealed mildly active steatohepatitis with presence of advanced fibrosis stage 3/4, recommended patient adhere to mediterranean diet with weight reduction of 5-10% as well as MELD labs/INR/AFP and RUQ [place] Q6 months.  Last seen 01/24/21,She was maintained on linzess 240mg daily with good results. Breakthrough GERD symptoms on once daily protonix, PPI increased to BID. Was working on exercise and diet and was down 14 pounds at last visit. Still using etoh and ibuprofen on occasion, which she was counseled on and advised against both of these.  MELD labs/INR and RUQ [place] updated after last visit: RUQ [place] 20[place]:The common bile duct is mildly dilated measuring 8 mm, a new finding.correlate clinically  for distal biliary obstruction. 2. Echogenic liver likely related to fatty infiltration. Labs Done in January 2023: INR 1, AFP 9.3, AST 35, ALT 32, ALk PHos 96, plt count 103k  Present: States that she is taking Linzess 2990m, however this is giving her runny diarrhea, up to 5-6 episodes per day. She is currently taking medication every 2-3 days. She reports that she tried taking the medication every other day, however, was still having runny stools. Having bloating and constipation in between when she takes it every 2-3 days. She states this morning, she had to strain to have a BM, as last dose was about 2 days ago. Previously was on linzess 14534mdaily but felt that she was not having good results with this as she was still having some constipation and like she had to strain to empty her bowels. Denies rectal bleeding or melena.   States that she is still having some GERD symptoms with burning in her throat, 2-3x/week, despite taking her protonix BID, states she belches a lot. Has occasional dysphagia if she gets upset or anxious, otherwise has no issues with foods going down. Denies early satiety, changes in appetite or weight loss.   Exercise is not going well recently due to chronic knee pain inhibiting her from doing a lot of extra physical activity. She also does not drive so she has to [place] [facility] which is not always possible. [facility] will only transport to doctors appts.  She has tried to watch what she is eating but has had a lot of recent  family functions and knows she is eating unhealthy. She is going to try and get better control over her diet. Weight 218lbs, up from 210 at last visit.  She has a lot of arthritic pain, she will take occasional ibuprofen for this, she is also maintained on Mobic 42m nightly. She drinks very occasionally, maybe 1-2 beers, 1-2x/month at the most. Does not drink any liquor.  Denies confusion, swelling to her abdomen,  pruritus or jaundice.   Gastric emptying study August 2022: normal Last Colonoscopy:11/13/20- A single non-bleeding colonic angiodysplastic lesion. - Diverticulosis in the sigmoid colon. - Non-bleeding external internal hemorrhoids. - No specimens collected.  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 (Short segment barretts) - 1 cm hiatal hernia. - Normal stomach. - Normal examined duodenum. Biopsied-normal No presence of varices   Recommendations:  Repeat EGD in July 2024 for varices/2027 years for barrett's Repeat colonoscopy august 2032  Past Medical History:  Diagnosis Date   Anxiety    Arthritis    [place]    Depression    [facility]  Essential hypertension    Frequent PVCs    Outflow tract, positive in the inferior leads   GERD (gastroesophageal reflux disease)    Hepatitis C    Treated   History of pneumonia    Insomnia    Migraine headache    NASH (nonalcoholic steatohepatitis)    Sleep apnea    Splenomegaly    Thyroid disease    Wears glasses     Past Surgical History:  Procedure Laterality Date   ABDOMINAL HYSTERECTOMY     ANKLE SURGERY Right    BIOPSY  10/05/2020   Procedure: BIOPSY;  Surgeon: CMontez Morita DQuillian Quince MD;  [facility];  Service: Gastroenterology;;   COLON SURGERY     "part of color removed"   COLONOSCOPY     COLONOSCOPY WITH PROPOFOL N/A 11/13/2020   Castaneda:single non bleeding colonic angiodysplastic lesion, diverticulosis in sigmoid, non bleeding external hemorrhoids, no specimens   ESOPHAGEAL MANOMETRY N/A 04/10/2014   Procedure: ESOPHAGEAL MANOMETRY (EM);  Surgeon: CGatha Mayer MD;  [facility];  Service: Endoscopy;  Laterality: N/A;   ESOPHAGOGASTRODUODENOSCOPY     ESOPHAGOGASTRODUODENOSCOPY (EGD) WITH PROPOFOL N/A  10/05/2020   Castaneda: barrett's esophagus, short segment, 1cm hh, normal stomach, normal duodenum, biopsied, normal, no varices   INCISIONAL HERNIA REPAIR N/A 10/24/2015   Procedure: LAPAROSCOPIC REPAIR INCISIONAL HERNIA WITH MESH;  Surgeon: BGeorganna Skeans MD;  [place]  Service: General;  Laterality: N/A;   INSERTION OF MESH N/A 10/24/2015   Procedure: INSERTION OF MESH;  Surgeon: BGeorganna Skeans MD;  [place]  Service: General;  Laterality: N/A;   JOINT REPLACEMENT     KNEE SURGERY Right    x4   [place] 04/10[place]2014   Procedure: [place] STUDY;  Surgeon: CGatha Mayer MD;  [facility];  Service: Endoscopy;  Laterality: N/A;   TOTAL SHOULDER ARTHROPLASTY Left 04/22/2018   Procedure: Left total shoulder arthroplasty;  Surgeon: SJustice Britain MD;  [facility];  Service: Orthopedics;  Laterality: Left;  1264m    Current Outpatient Medications  Medication Sig Dispense Refill   albuterol (PROVENTIL HFA;VENTOLIN HFA) 108 (90 Base) MCG/ACT inhaler Inhale 2 puffs into the lungs every 6 (six) hours as needed for wheezing or shortness of breath.  albuterol (PROVENTIL) (2.5 MG/3ML) 0.083% nebulizer solution Take 2.5 mg by nebulization every 6 (six) hours as needed for wheezing or shortness of breath.     ALPRAZolam (XANAX) 1 MG tablet Take 1 tablet (1 mg total) by mouth 4 (four) times daily. 120 tablet 3   amitriptyline (ELAVIL) 10 MG tablet Take 1 tablet (10 mg total) by mouth at bedtime. 90 tablet 3   Ascorbic Acid (VITAMIN C) 1000 MG tablet Take 1,000 mg by mouth daily.     aspirin EC 81 MG tablet Take 81 mg by mouth daily.     cyanocobalamin 1000 MCG tablet Take 2,000 mcg by mouth daily.     cycloSPORINE (RESTASIS) 0.05 % ophthalmic emulsion Place 1 drop into both eyes 2 (two) times daily as needed (dry eyes).     ELDERBERRY PO Take 1 each by mouth daily.     ergocalciferol (VITAMIN D2) 1.25 MG (50000 UT) capsule Take 50,000 Units by mouth  2 (two) times a week.     FLUoxetine (PROZAC) 40 MG capsule Take 1 capsule (40 mg total) by mouth daily. 30 capsule 3   Fluticasone-Umeclidin-Vilant 100-62.5-25 MCG/INH AEPB Inhale 1 puff into the lungs daily as needed (asthma).     gabapentin (NEURONTIN) 100 MG capsule Take 300 mg by mouth 3 (three) times daily.     hyoscyamine (LEVSIN) 0.125 MG tablet Take 1 tablet (0.125 mg total) by mouth every 8 (eight) hours as needed (abdominal pain). 30 tablet 2   linaclotide (LINZESS) 290 MCG CAPS capsule Take 1 capsule (290 mcg total) by mouth daily before breakfast. 90 capsule 3   meloxicam (MOBIC) 15 MG tablet Take 1 tablet (15 mg total) by mouth daily. (Patient taking differently: Take 15 mg by mouth at bedtime.) 30 tablet 1   metoprolol succinate (TOPROL-XL) 100 MG 24 hr tablet TAKE ONE TABLET BY MOUTH TWICE DAILY WITH OR IMMEDIATELY FOLLOWING A MEAL. 180 tablet 1   Multiple Vitamin (MULTI-VITAMIN PO) Take 1 tablet by mouth daily.     nystatin (MYCOSTATIN) powder Apply 1 g topically daily.  2   Omega-3 Fatty Acids (FISH OIL) 1200 MG CAPS Take 2,400 mg by mouth daily.     pantoprazole (PROTONIX) 40 MG tablet Take 1 tablet (40 mg total) by mouth 2 (two) times daily. 120 tablet 5   rosuvastatin (CRESTOR) 10 MG tablet Take 10 mg by mouth daily.     Simethicone (GAS RELIEF PO) Take 2 capsules by mouth as needed (flatulence).      temazepam (RESTORIL) 30 MG capsule Take 1 capsule (30 mg total) by mouth at bedtime as needed. for sleep 30 capsule 3   traMADol (ULTRAM) 50 MG tablet Take 50 mg by mouth 3 (three) times daily as needed for severe pain.     cyclobenzaprine (FLEXERIL) 10 MG tablet Take 10 mg by mouth every 8 (eight) hours as needed for muscle spasms. (Patient not taking: Reported on 08/01/2021)     No current facility-administered medications for this visit.    Allergies as of 08/01/2021 - Review Complete 08/01/2021  Allergen Reaction Noted   Levothyroxine Hives and Itching 11/25/2013    Tylenol [acetaminophen] Other (See Comments) 10/18/2015   Nabumetone Rash 10/11/2019    Family History  Problem Relation Age of Onset   Alcohol abuse Father    Dementia Father    Dementia Maternal Grandmother    Bipolar disorder Daughter    Anxiety disorder Daughter    Depression Brother    Drug abuse Brother  Alcohol abuse Paternal Uncle    Depression Paternal Uncle    ADD / ADHD Neg Hx     Social History   Socioeconomic History   Marital status: Widowed    Spouse name: Not on file   Number of children: 2   Years of education: 12th grade   Highest education level: Not on file  Occupational History   Occupation: RETIRED  Tobacco Use   Smoking status: Never    Passive exposure: Current   Smokeless tobacco: Never  Vaping Use   Vaping Use: Never used  Substance and Sexual Activity   Alcohol use: Yes    Alcohol/week: 3.0 standard drinks    Types: 3 Cans of beer per week    Comment: occasionally   Drug use: No   Sexual activity: Never  Other Topics Concern   Not on file  Social History Narrative   Not on file   Social Determinants of Health   Financial Resource Strain: Not on file  Food Insecurity: Not on file  Transportation Needs: Not on file  Physical Activity: Not on file  Stress: Not on file  Social Connections: Not on file   Review of systems General: negative for malaise, night sweats, fever, chills, weight loss Neck: Negative for lumps, goiter, pain and significant neck swelling Resp: Negative for cough, wheezing, dyspnea at rest CV: Negative for chest pain, leg swelling, palpitations, orthopnea GI: denies melena, hematochezia, nausea, vomiting, dysphagia, odyonophagia, early satiety or unintentional weight loss. +constipation  MSK: Negative joint swelling, back pain, and muscle pain. +joint pain Derm: Negative for itching or rash Psych: Denies depression, anxiety, memory loss, confusion. No homicidal or suicidal ideation.  Heme: Negative for  prolonged bleeding, bruising easily, and swollen nodes. Endocrine: Negative for cold or heat intolerance, polyuria, polydipsia and goiter. Neuro: negative for tremor, gait imbalance, syncope and seizures. The remainder of the review of systems is noncontributory.  Physical Exam: BP (!) 147/77 (BP Location: Left Arm, Patient Position: Sitting, Cuff Size: Large)   Pulse (!) 56   Temp 98.2 F (36.8 C) (Oral)   Ht 5' 3"  (1.6 m)   Wt 218 lb 8 oz (99.1 kg)   BMI 38.71 kg/m  General:   Alert and oriented. No distress noted. Pleasant and cooperative. obese Head:  Normocephalic and atraumatic. Eyes:  Conjuctiva clear without scleral icterus. Mouth:  Oral mucosa pink and moist. Good dentition. No lesions. Heart: Normal rate and rhythm, s1 and s2 heart sounds present.  Lungs: Clear lung sounds in all lobes. Respirations equal and unlabored. Abdomen:  +BS, soft, non-tender, full but soft. Mild TTP of LLQ. No rebound or guarding. No HSM or masses noted. Derm: No palmar erythema or jaundice Msk:  Symmetrical without gross deformities. Normal posture. Extremities:  Without edema. Neurologic:  Alert and  oriented x4 Psych:  Alert and cooperative. Normal mood and affect.  Invalid input(s): 6 MONTHS   ASSESSMENT: VICTORINE MCNEE is a 71 y.o. female presenting today for follow up of NASH with advance fibrosis, constipation, and GERD/Barrett's esophagus.  GERD/Barrett's esophagus: continues with breakthrough symptoms 2-3x/week on protonix 26m BID, hx of barrett's esophagus, discussed with patient the importance of having GERD well controlled. Will stop protonix and start omeprazole 47monce daily. Should continue with reflux precautions to include avoiding greasy, spicy, fried, citrus foods, and be mindful that caffeine, carbonated drinks, and chocolate can worsen symptoms.  Stay upright 2-3 hours after eating, prior to lying down and avoid eating late in the  evenings.  IBS-C/opioid induced  constipation: currently on linzess 246mg as 147m did not provide good results, however, cannot tolerate linzess 29038mdaily as she will have 5-6 episodes of runny diarrhea, has been taking every 2-3 days as even every other day dosing causes runny stools. On every 2-3 day dosing, she is having intermittent hard stools with the need to strain with defecation as well as bloating. Encouraged to stay well hydrated, eat diet high in fruits, veggies and whole grains. Will sample Trulance 3mg18mily, advised to stop linzess at this time. Made Aware that initial GI upset can occur with initiation of motility agents such as Trulance, if she has good results she will make me aware so that Rx can be sent.  NASH with advanced fibrosis: no swelling to abdomen, pruritus, jaundice or episodes of confusion. Liver biopsy in august 2022 with signs of advanced fibrosis but not cirrhosis, will continue with 6 month monitoring with MELD labs/INR, AFP and RUQ US. [place] is due for RUQ US a[place]orealast was in Nov 2023. Labs will be dune in [facility]. Pt is aware. I encouraged her again to exercise as possible and implement mediterranean diet with importance in reduction of weight by 5-10% overall to prevent worsening of her fibrosis. She was again counseled on use of NSAIDs and ETOH in presence of NASH and advanced fibrosis and advised to avoid these.   PLAN:  -Update RUQ US -[place] labs, AFP and INR due July 2023 -stop linzess, samples of trulance 3mg 33mvided, will Rx with good results -Continue with exercise, mediterranean diet -stop protonix  -start omprazole 40mg 33my, Rx sent, continue with reflux precautions - Reduce salt intake to <2 g per day - Can take Tylenol max of 2 g per day (650 mg q8h) for pain - Avoid NSAIDs for pain -Avoid alcohol** - Avoid eating raw oysters/shellfish - Ensure every night before going to sleep  All questions were answered, patient verbalized understanding and is in agreement with plan as  outlined above.   Follow Up: 6 months  Conlee Sliter L. CarlanAlver Sorrow APRN, AGNP-C Adult-Gerontology Nurse Practitioner [facility]

## 2021-08-01 NOTE — Patient Instructions (Signed)
-  we will Update RUQ US - liver labs due in July, we will call you closer to time to remind you of this -stop linzess -samples of trulance provided, take this once a day, let me know if you are having good results and I will send prescription to your pharmacy -Continue with exercise as able, mediterranean diet -stop protonix  -I have sent omeprazole 43m to your pharmacy Please take this 30 minutes prior to breakfast Avoid greasy, spicy, fried, citrus foods, and be mindful that caffeine, carbonated drinks, chocolate and alcohol can increase reflux symptoms. Stay upright 2-3 hours after eating, prior to lying down and avoid eating late in the evenings.  - Reduce salt intake to <2 g per day - Can take Tylenol max of 2 g per day (650 mg q8h) for pain - Avoid NSAIDs for pain (ibuprofen, advil, aleve, naproxen, goody powder, mobic, meloxican) -avoid alcohol - Avoid eating raw oysters/shellfish - Ensure every night before going to sleep  Follow up 6 months

## 2021-08-15 ENCOUNTER — Ambulatory Visit (HOSPITAL_COMMUNITY)
Admission: RE | Admit: 2021-08-15 | Discharge: 2021-08-15 | Disposition: A | Discharge status: Home / Self Care | Payer: Medicare Other | Source: Ambulatory Visit | Attending: Gastroenterology | Admitting: Gastroenterology

## 2021-08-15 DIAGNOSIS — K7581 Nonalcoholic steatohepatitis (NASH): Secondary | ICD-10-CM | POA: Diagnosis not present

## 2021-08-15 DIAGNOSIS — Z8619 Personal history of other infectious and parasitic diseases: Secondary | ICD-10-CM

## 2021-08-15 DIAGNOSIS — I493 Ventricular premature depolarization: Secondary | ICD-10-CM | POA: Diagnosis not present

## 2021-08-16 ENCOUNTER — Encounter: Payer: Self-pay | Admitting: Cardiology

## 2021-08-16 ENCOUNTER — Encounter (INDEPENDENT_AMBULATORY_CARE_PROVIDER_SITE_OTHER): Payer: Self-pay | Admitting: Gastroenterology

## 2021-08-16 ENCOUNTER — Telehealth: Payer: Self-pay | Admitting: *Deleted

## 2021-08-16 DIAGNOSIS — I493 Ventricular premature depolarization: Secondary | ICD-10-CM

## 2021-08-16 NOTE — Telephone Encounter (Signed)
-----   Message from Satira Sark, MD sent at 08/15/2021 10:29 AM EDT ----- Results reviewed.  Follow-up cardiac monitor shows frequent PVCs, nearly 20% total beats at this point.  No sustained ventricular arrhythmias however.  I would suggest we get a follow-up echocardiogram to ensure no change in LVEF.

## 2021-08-16 NOTE — Telephone Encounter (Signed)
Patient informed and verbalized understanding. Copy sent to PCP

## 2021-08-19 ENCOUNTER — Ambulatory Visit (INDEPENDENT_AMBULATORY_CARE_PROVIDER_SITE_OTHER): Payer: Medicare Other

## 2021-08-19 DIAGNOSIS — I493 Ventricular premature depolarization: Secondary | ICD-10-CM

## 2021-08-19 DIAGNOSIS — I340 Nonrheumatic mitral (valve) insufficiency: Secondary | ICD-10-CM | POA: Diagnosis not present

## 2021-08-19 LAB — ECHOCARDIOGRAM COMPLETE
AR max vel: 1.19 cm2
AV Area VTI: 1.49 cm2
AV Area mean vel: 1.34 cm2
AV Mean grad: 6 mmHg
AV Peak grad: 15.5 mmHg
Ao pk vel: 1.97 m/s
Area-P 1/2: 4.63 cm2
Calc EF: 59.8 %
MV M vel: 4.32 m/s
MV Peak grad: 74.6 mmHg
S' Lateral: 3.87 cm
Single Plane A2C EF: 59.9 %
Single Plane A4C EF: 58.3 %

## 2021-08-20 ENCOUNTER — Other Ambulatory Visit (INDEPENDENT_AMBULATORY_CARE_PROVIDER_SITE_OTHER): Payer: Self-pay | Admitting: Gastroenterology

## 2021-08-20 DIAGNOSIS — R932 Abnormal findings on diagnostic imaging of liver and biliary tract: Secondary | ICD-10-CM

## 2021-08-20 DIAGNOSIS — K838 Other specified diseases of biliary tract: Secondary | ICD-10-CM | POA: Insufficient documentation

## 2021-08-20 DIAGNOSIS — L299 Pruritus, unspecified: Secondary | ICD-10-CM | POA: Insufficient documentation

## 2021-08-20 DIAGNOSIS — K7581 Nonalcoholic steatohepatitis (NASH): Secondary | ICD-10-CM

## 2021-08-21 DIAGNOSIS — R932 Abnormal findings on diagnostic imaging of liver and biliary tract: Secondary | ICD-10-CM | POA: Diagnosis not present

## 2021-08-21 DIAGNOSIS — L299 Pruritus, unspecified: Secondary | ICD-10-CM | POA: Diagnosis not present

## 2021-08-21 DIAGNOSIS — K7581 Nonalcoholic steatohepatitis (NASH): Secondary | ICD-10-CM | POA: Diagnosis not present

## 2021-08-22 LAB — HEPATIC FUNCTION PANEL
AG Ratio: 1.3 (calc) (ref 1.0–2.5)
ALT: 30 U/L — ABNORMAL HIGH (ref 6–29)
AST: 46 U/L — ABNORMAL HIGH (ref 10–35)
Albumin: 4.2 g/dL (ref 3.6–5.1)
Alkaline phosphatase (APISO): 96 U/L (ref 37–153)
Bilirubin, Direct: 0.2 mg/dL (ref 0.0–0.2)
Globulin: 3.3 g/dL (calc) (ref 1.9–3.7)
Indirect Bilirubin: 0.6 mg/dL (calc) (ref 0.2–1.2)
Total Bilirubin: 0.8 mg/dL (ref 0.2–1.2)
Total Protein: 7.5 g/dL (ref 6.1–8.1)

## 2021-08-22 NOTE — Progress Notes (Signed)
Called and discussed with patient per [facility] - Liver enzymes are up slightly but bilirubin is normal which is reassuring. Will be in touch once MRCP is completed and resulted. Patient verbalized understanding.

## 2021-08-27 DIAGNOSIS — Z1231 Encounter for screening mammogram for malignant neoplasm of breast: Secondary | ICD-10-CM | POA: Diagnosis not present

## 2021-09-04 ENCOUNTER — Other Ambulatory Visit (INDEPENDENT_AMBULATORY_CARE_PROVIDER_SITE_OTHER): Payer: Self-pay | Admitting: Gastroenterology

## 2021-09-04 ENCOUNTER — Ambulatory Visit (HOSPITAL_COMMUNITY)
Admission: RE | Admit: 2021-09-04 | Discharge: 2021-09-04 | Disposition: A | Discharge status: Home / Self Care | Payer: Medicare Other | Source: Ambulatory Visit | Attending: Gastroenterology | Admitting: Gastroenterology

## 2021-09-04 DIAGNOSIS — R932 Abnormal findings on diagnostic imaging of liver and biliary tract: Secondary | ICD-10-CM | POA: Insufficient documentation

## 2021-09-04 DIAGNOSIS — L299 Pruritus, unspecified: Secondary | ICD-10-CM | POA: Diagnosis not present

## 2021-09-04 DIAGNOSIS — K769 Liver disease, unspecified: Secondary | ICD-10-CM | POA: Diagnosis not present

## 2021-09-04 DIAGNOSIS — N281 Cyst of kidney, acquired: Secondary | ICD-10-CM | POA: Diagnosis not present

## 2021-09-04 DIAGNOSIS — K828 Other specified diseases of gallbladder: Secondary | ICD-10-CM | POA: Diagnosis not present

## 2021-09-04 DIAGNOSIS — K7581 Nonalcoholic steatohepatitis (NASH): Secondary | ICD-10-CM | POA: Diagnosis not present

## 2021-09-04 DIAGNOSIS — R935 Abnormal findings on diagnostic imaging of other abdominal regions, including retroperitoneum: Secondary | ICD-10-CM | POA: Diagnosis not present

## 2021-09-04 MED ORDER — GADOBUTROL 1 MMOL/ML IV SOLN
10.0000 mL | Freq: Once | INTRAVENOUS | Status: AC | PRN
  Administered 2021-09-04: 10 mL via INTRAVENOUS

## 2021-09-09 ENCOUNTER — Other Ambulatory Visit (INDEPENDENT_AMBULATORY_CARE_PROVIDER_SITE_OTHER): Payer: Self-pay | Admitting: Gastroenterology

## 2021-09-09 DIAGNOSIS — R109 Unspecified abdominal pain: Secondary | ICD-10-CM

## 2021-09-09 DIAGNOSIS — K581 Irritable bowel syndrome with constipation: Secondary | ICD-10-CM

## 2021-09-09 NOTE — Telephone Encounter (Signed)
Last OV with chelsea on 08/01/21

## 2021-09-09 NOTE — Telephone Encounter (Signed)
Patient states trulance was not working and she was staying bloated and she went back on linzess

## 2021-09-11 DIAGNOSIS — R5383 Other fatigue: Secondary | ICD-10-CM | POA: Diagnosis not present

## 2021-09-11 DIAGNOSIS — I10 Essential (primary) hypertension: Secondary | ICD-10-CM | POA: Diagnosis not present

## 2021-09-11 DIAGNOSIS — Z0000 Encounter for general adult medical examination without abnormal findings: Secondary | ICD-10-CM | POA: Diagnosis not present

## 2021-09-11 DIAGNOSIS — M25561 Pain in right knee: Secondary | ICD-10-CM | POA: Diagnosis not present

## 2021-09-11 DIAGNOSIS — M069 Rheumatoid arthritis, unspecified: Secondary | ICD-10-CM | POA: Diagnosis not present

## 2021-09-11 DIAGNOSIS — Z299 Encounter for prophylactic measures, unspecified: Secondary | ICD-10-CM | POA: Diagnosis not present

## 2021-09-11 DIAGNOSIS — Z789 Other specified health status: Secondary | ICD-10-CM | POA: Diagnosis not present

## 2021-09-11 DIAGNOSIS — E7800 Pure hypercholesterolemia, unspecified: Secondary | ICD-10-CM | POA: Diagnosis not present

## 2021-09-11 DIAGNOSIS — E039 Hypothyroidism, unspecified: Secondary | ICD-10-CM | POA: Diagnosis not present

## 2021-09-11 DIAGNOSIS — Z79899 Other long term (current) drug therapy: Secondary | ICD-10-CM | POA: Diagnosis not present

## 2021-09-27 ENCOUNTER — Telehealth (INDEPENDENT_AMBULATORY_CARE_PROVIDER_SITE_OTHER): Payer: Self-pay | Admitting: *Deleted

## 2021-09-27 NOTE — Telephone Encounter (Signed)
I have patient in reminder file to do afp, cmp, inr/pt, cbc July24th. She had liver profile on 6/7 and MRCP in June and that note states to repeat labs 6 months. Does she still need labs July 24th?

## 2021-09-30 ENCOUNTER — Other Ambulatory Visit (INDEPENDENT_AMBULATORY_CARE_PROVIDER_SITE_OTHER): Payer: Self-pay | Admitting: *Deleted

## 2021-09-30 DIAGNOSIS — H52213 Irregular astigmatism, bilateral: Secondary | ICD-10-CM | POA: Diagnosis not present

## 2021-09-30 DIAGNOSIS — H2513 Age-related nuclear cataract, bilateral: Secondary | ICD-10-CM | POA: Diagnosis not present

## 2021-09-30 DIAGNOSIS — K7581 Nonalcoholic steatohepatitis (NASH): Secondary | ICD-10-CM

## 2021-09-30 DIAGNOSIS — H2511 Age-related nuclear cataract, right eye: Secondary | ICD-10-CM | POA: Diagnosis not present

## 2021-09-30 DIAGNOSIS — H25013 Cortical age-related cataract, bilateral: Secondary | ICD-10-CM | POA: Diagnosis not present

## 2021-09-30 DIAGNOSIS — H35363 Drusen (degenerative) of macula, bilateral: Secondary | ICD-10-CM | POA: Diagnosis not present

## 2021-09-30 DIAGNOSIS — H35033 Hypertensive retinopathy, bilateral: Secondary | ICD-10-CM | POA: Diagnosis not present

## 2021-09-30 DIAGNOSIS — H524 Presbyopia: Secondary | ICD-10-CM | POA: Diagnosis not present

## 2021-09-30 NOTE — Telephone Encounter (Signed)
Left message to return call. Orders put in for labs.

## 2021-10-01 NOTE — Telephone Encounter (Signed)
Left message to return call 

## 2021-10-01 NOTE — Telephone Encounter (Signed)
Pt notified she needs labs around July 24th and wanted orders mailed to her. I placed in the mail.

## 2021-10-07 DIAGNOSIS — K7460 Unspecified cirrhosis of liver: Secondary | ICD-10-CM | POA: Diagnosis not present

## 2021-10-07 DIAGNOSIS — K7581 Nonalcoholic steatohepatitis (NASH): Secondary | ICD-10-CM | POA: Diagnosis not present

## 2021-10-09 LAB — CBC
HCT: 39.9 % (ref 35.0–45.0)
Hemoglobin: 13.7 g/dL (ref 11.7–15.5)
MCH: 34.7 pg — ABNORMAL HIGH (ref 27.0–33.0)
MCHC: 34.3 g/dL (ref 32.0–36.0)
MCV: 101 fL — ABNORMAL HIGH (ref 80.0–100.0)
MPV: 11.9 fL (ref 7.5–12.5)
Platelets: 63 10*3/uL — ABNORMAL LOW (ref 140–400)
RBC: 3.95 10*6/uL (ref 3.80–5.10)
RDW: 14.1 % (ref 11.0–15.0)
WBC: 4.2 10*3/uL (ref 3.8–10.8)

## 2021-10-09 LAB — AFP TUMOR MARKER: AFP-Tumor Marker: 7.6 ng/mL — ABNORMAL HIGH

## 2021-10-09 LAB — PROTIME-INR
INR: 1
Prothrombin Time: 10.9 s (ref 9.0–11.5)

## 2021-11-05 DIAGNOSIS — H2511 Age-related nuclear cataract, right eye: Secondary | ICD-10-CM | POA: Diagnosis not present

## 2021-11-05 DIAGNOSIS — H25811 Combined forms of age-related cataract, right eye: Secondary | ICD-10-CM | POA: Diagnosis not present

## 2021-11-11 ENCOUNTER — Other Ambulatory Visit (HOSPITAL_COMMUNITY): Payer: Self-pay | Admitting: Psychiatry

## 2021-11-11 ENCOUNTER — Other Ambulatory Visit: Payer: Self-pay | Admitting: Cardiology

## 2021-11-11 NOTE — Telephone Encounter (Signed)
Call for appt

## 2021-11-13 ENCOUNTER — Telehealth: Payer: Self-pay | Admitting: Cardiology

## 2021-11-13 MED ORDER — METOPROLOL SUCCINATE ER 100 MG PO TB24: ORAL_TABLET | ORAL | 1 refills | Status: DC

## 2021-11-13 NOTE — Telephone Encounter (Signed)
*  STAT* If patient is at the pharmacy, call can be transferred to refill team.   1. Which medications need to be refilled? (please list name of each medication and dose if known)   metoprolol succinate (TOPROL-XL) 100 MG 24 hr tablet  2. Which pharmacy/location (including street and city if local pharmacy) is medication to be sent to?  [facility] - [place], [place] - [place]  3. Do they need a 30 day or 90 day supply?    Patient stated she has one refill left but will need enough of this medication to last until her appointment on 02/10/22.

## 2021-11-14 ENCOUNTER — Telehealth (INDEPENDENT_AMBULATORY_CARE_PROVIDER_SITE_OTHER): Payer: Medicare Other | Admitting: Psychiatry

## 2021-11-14 ENCOUNTER — Encounter (HOSPITAL_COMMUNITY): Payer: Self-pay | Admitting: Psychiatry

## 2021-11-14 DIAGNOSIS — F331 Major depressive disorder, recurrent, moderate: Secondary | ICD-10-CM | POA: Diagnosis not present

## 2021-11-14 DIAGNOSIS — F418 Other specified anxiety disorders: Secondary | ICD-10-CM

## 2021-11-14 MED ORDER — ALPRAZOLAM 1 MG PO TABS
1.0000 mg | ORAL_TABLET | Freq: Four times a day (QID) | ORAL | 3 refills | Status: DC
Start: 2021-11-14 — End: 2022-01-15

## 2021-11-14 MED ORDER — FLUOXETINE HCL 40 MG PO CAPS
40.0000 mg | ORAL_CAPSULE | Freq: Every day | ORAL | 3 refills | Status: DC
Start: 2021-11-14 — End: 2022-01-15

## 2021-11-14 MED ORDER — TEMAZEPAM 30 MG PO CAPS: 30.0000 mg | ORAL_CAPSULE | Freq: Every evening | ORAL | 3 refills | Status: DC | PRN

## 2021-11-14 NOTE — Progress Notes (Signed)
Virtual Visit via Telephone Note  I connected with Briscoe Deutscher on 11/14/21 at 10:40 AM EDT by telephone and verified that I am speaking with the correct person using two identifiers.  Location: Patient: home Provider: home office   I discussed the limitations, risks, security and privacy concerns of performing an evaluation and management service by telephone and the availability of in person appointments. I also discussed with the patient that there may be a patient responsible charge related to this service. The patient expressed understanding and agreed to proceed.      I discussed the assessment and treatment plan with the patient. The patient was provided an opportunity to ask questions and all were answered. The patient agreed with the plan and demonstrated an understanding of the instructions.   The patient was advised to call back or seek an in-person evaluation if the symptoms worsen or if the condition fails to improve as anticipated.  I provided 15 minutes of non-face-to-face time during this encounter.   [name], MD  [facility] MD/PA/NP OP Progress Note  11/14/2021 11:09 AM Briscoe Deutscher  MRN:  [id number]  Chief Complaint:  Chief Complaint  Patient presents with   Anxiety   Depression   Follow-up   HPI: This patient is a 71 year old widowed white female who lives alone in [place].  Her daughter's son and brothers live nearby.  She is on disability.  The patient returns for follow-up after 3 months regarding her depression and anxiety.  She states right now she is sick with a bad cold and bronchitis.  Overall however her mood has been fairly stable.  She denies significant depression or anxiety.  She recently had cataract removed and is going to have the other eye done.  She is sleeping pretty well most of the time.  Her energy is not the best right now but when she gets over the cold she plans to get more exercise.  She denies any thoughts of self-harm or suicidal  ideation Visit Diagnosis:    ICD-10-CM   1. Major depressive disorder, recurrent episode, moderate (HCC)  F33.1       Past Psychiatric History: none  Past Medical History:  Past Medical History:  Diagnosis Date   Anxiety    Arthritis    COPD (chronic obstructive pulmonary disease) ([place])    Depression    Diverticulitis 04/2012   [facility]   Essential hypertension    Frequent PVCs    Outflow tract, positive in the inferior leads   GERD (gastroesophageal reflux disease)    Hepatitis C    Treated   History of pneumonia    Insomnia    Migraine headache    NASH (nonalcoholic steatohepatitis)    Sleep apnea    Splenomegaly    Thyroid disease    Wears glasses     Past Surgical History:  Procedure Laterality Date   ABDOMINAL HYSTERECTOMY     ANKLE SURGERY Right    BIOPSY  10/05/2020   Procedure: BIOPSY;  Surgeon: Montez Morita, Quillian Quince, MD;  [facility];  Service: Gastroenterology;;   COLON SURGERY     "part of color removed"   COLONOSCOPY     COLONOSCOPY WITH PROPOFOL N/A 11/13/2020   Castaneda:single non bleeding colonic angiodysplastic lesion, diverticulosis in sigmoid, non bleeding external hemorrhoids, no specimens   ESOPHAGEAL MANOMETRY N/A 04/10/2014   Procedure: ESOPHAGEAL MANOMETRY (EM);  Surgeon: Gatha Mayer, MD;  Location: [facility];  Service: Endoscopy;  Laterality:  N/A;   ESOPHAGOGASTRODUODENOSCOPY     ESOPHAGOGASTRODUODENOSCOPY (EGD) WITH PROPOFOL N/A 10/05/2020   Castaneda: barrett's esophagus, short segment, 1cm hh, normal stomach, normal duodenum, biopsied, normal, no varices   INCISIONAL HERNIA REPAIR N/A 10/24/2015   Procedure: LAPAROSCOPIC REPAIR INCISIONAL HERNIA WITH MESH;  Surgeon: Georganna Skeans, MD;  Location: [place];  Service: General;  Laterality: N/A;   INSERTION OF MESH N/A 10/24/2015   Procedure: INSERTION OF MESH;  Surgeon: Georganna Skeans, MD;  Location: [place];  Service: General;  Laterality: N/A;   JOINT  REPLACEMENT     KNEE SURGERY Right    x4   [place] IMPEDANCE STUDY N/A 04/10/2014   Procedure: [place] IMPEDANCE STUDY;  Surgeon: Gatha Mayer, MD;  Location: [facility];  Service: Endoscopy;  Laterality: N/A;   TOTAL SHOULDER ARTHROPLASTY Left 04/22/2018   Procedure: Left total shoulder arthroplasty;  Surgeon: Justice Britain, MD;  Location: [facility];  Service: Orthopedics;  Laterality: Left;  142mn    Family Psychiatric History: see below  Family History:  Family History  Problem Relation Age of Onset   Alcohol abuse Father    Dementia Father    Dementia Maternal Grandmother    Bipolar disorder Daughter    Anxiety disorder Daughter    Depression Brother    Drug abuse Brother    Alcohol abuse Paternal Uncle    Depression Paternal Uncle    ADD / ADHD Neg Hx     Social History:  Social History   Socioeconomic History   Marital status: Widowed    Spouse name: Not on file   Number of children: 2   Years of education: 12th grade   Highest education level: Not on file  Occupational History   Occupation: RETIRED  Tobacco Use   Smoking status: Never    Passive exposure: Current   Smokeless tobacco: Never  Vaping Use   Vaping Use: Never used  Substance and Sexual Activity   Alcohol use: Yes    Alcohol/week: 3.0 standard drinks of alcohol    Types: 3 Cans of beer per week    Comment: occasionally   Drug use: No   Sexual activity: Never  Other Topics Concern   Not on file  Social History Narrative   Not on file   Social Determinants of Health   Financial Resource Strain: Not on file  Food Insecurity: Not on file  Transportation Needs: Not on file  Physical Activity: Not on file  Stress: Not on file  Social Connections: Not on file    Allergies:  Allergies  Allergen Reactions   Levothyroxine Hives and Itching   Tylenol [Acetaminophen] Other (See Comments)    Liver function per MD    Nabumetone Rash    Metabolic Disorder Labs: No results found for: "HGBA1C",  "MPG" No results found for: "PROLACTIN" No results found for: "CHOL", "TRIG", "HDL", "CHOLHDL", "VLDL", "LDLCALC" Lab Results  Component Value Date   TSH 3.26 10/17/2019   TSH 4.540 (H) 10/28/2007    Therapeutic Level Labs: No results found for: "LITHIUM" No results found for: "VALPROATE" No results found for: "CBMZ"  Current Medications: Current Outpatient Medications  Medication Sig Dispense Refill   albuterol (PROVENTIL HFA;VENTOLIN HFA) 108 (90 Base) MCG/ACT inhaler Inhale 2 puffs into the lungs every 6 (six) hours as needed for wheezing or shortness of breath.     albuterol (PROVENTIL) (2.5 MG/3ML) 0.083% nebulizer solution Take 2.5 mg by nebulization every 6 (six) hours as needed for wheezing or shortness of  breath.     ALPRAZolam (XANAX) 1 MG tablet Take 1 tablet (1 mg total) by mouth 4 (four) times daily. 120 tablet 3   amitriptyline (ELAVIL) 10 MG tablet TAKE ONE TABLET BY MOUTH AT BEDTIME 90 tablet 3   Ascorbic Acid (VITAMIN C) 1000 MG tablet Take 1,000 mg by mouth daily.     aspirin EC 81 MG tablet Take 81 mg by mouth daily.     cyanocobalamin 1000 MCG tablet Take 2,000 mcg by mouth daily.     cyclobenzaprine (FLEXERIL) 10 MG tablet Take 10 mg by mouth every 8 (eight) hours as needed for muscle spasms. (Patient not taking: Reported on 08/01/2021)     cycloSPORINE (RESTASIS) 0.05 % ophthalmic emulsion Place 1 drop into both eyes 2 (two) times daily as needed (dry eyes).     ELDERBERRY PO Take 1 each by mouth daily.     ergocalciferol (VITAMIN D2) 1.25 MG (50000 UT) capsule Take 50,000 Units by mouth 2 (two) times a week.     FLUoxetine (PROZAC) 40 MG capsule Take 1 capsule (40 mg total) by mouth daily. 30 capsule 3   Fluticasone-Umeclidin-Vilant 100-62.5-25 MCG/INH AEPB Inhale 1 puff into the lungs daily as needed (asthma).     gabapentin (NEURONTIN) 100 MG capsule Take 300 mg by mouth 3 (three) times daily.     hyoscyamine (LEVSIN) 0.125 MG tablet Take 1 tablet (0.125 mg  total) by mouth every 8 (eight) hours as needed (abdominal pain). 30 tablet 2   LINZESS 290 MCG CAPS capsule TAKE ONE CAPSULE BY MOUTH ONCE DAILY BEFORE BREAKFAST. 90 capsule 3   meloxicam (MOBIC) 15 MG tablet Take 1 tablet (15 mg total) by mouth daily. (Patient taking differently: Take 15 mg by mouth at bedtime.) 30 tablet 1   metoprolol succinate (TOPROL-XL) 100 MG 24 hr tablet TAKE ONE TABLET BY MOUTH TWICE DAILY WITH OR IMMEDIATELY FOLLOWING A MEAL. 180 tablet 1   Multiple Vitamin (MULTI-VITAMIN PO) Take 1 tablet by mouth daily.     nystatin (MYCOSTATIN) powder Apply 1 g topically daily.  2   Omega-3 Fatty Acids (FISH OIL) 1200 MG CAPS Take 2,400 mg by mouth daily.     omeprazole (PRILOSEC) 40 MG capsule Take 1 capsule (40 mg total) by mouth daily. 30 capsule 3   rosuvastatin (CRESTOR) 10 MG tablet Take 10 mg by mouth daily.     Simethicone (GAS RELIEF PO) Take 2 capsules by mouth as needed (flatulence).      temazepam (RESTORIL) 30 MG capsule Take 1 capsule (30 mg total) by mouth at bedtime as needed. for sleep 30 capsule 3   traMADol (ULTRAM) 50 MG tablet Take 50 mg by mouth 3 (three) times daily as needed for severe pain.     No current facility-administered medications for this visit.     Musculoskeletal: Strength & Muscle Tone: na Gait & Station: na Patient leans: N/A  Psychiatric Specialty Exam: Review of Systems  HENT:  Positive for congestion.   Respiratory:  Positive for cough.   All other systems reviewed and are negative.   There were no vitals taken for this visit.There is no height or weight on file to calculate BMI.  General Appearance: NA  Eye Contact:  NA  Speech:  Clear and Coherent  Volume:  Normal  Mood:  Euthymic  Affect:  NA  Thought Process:  Goal Directed  Orientation:  Full (Time, Place, and Person)  Thought Content: WDL   Suicidal Thoughts:  No  Homicidal Thoughts:  No  Memory:  Immediate;   Good Recent;   Good Remote;   Good  Judgement:  Good   Insight:  Fair  Psychomotor Activity:  Decreased  Concentration:  Concentration: Good and Attention Span: Good  Recall:  Good  Fund of Knowledge: Good  Language: Good  Akathisia:  no  Handed:  Right  AIMS (if indicated): not done  Assets:  Communication Skills Desire for Improvement Resilience Social Support  ADL's:  Intact  Cognition: WNL  Sleep:  Good   Screenings: PHQ2-9    Flowsheet Row Video Visit from 11/14/2021 in [place] Video Visit from 07/29/2021 in [facility] Video Visit from 05/07/2021 in [facility] Video Visit from 12/31/2020 in [place] Video Visit from 08/27/2020 in [facility]  PHQ-2 Total Score 0 1 1 0 1      Flowsheet Row Video Visit from 11/14/2021 in [facility] Video Visit from 07/29/2021 in [facility] Video Visit from 05/07/2021 in [place] No Risk No Risk No Risk        Assessment and Plan: This patient is a 71 year old female with a history of depression and anxiety.  She continues to do well on her current regimen.  She will continue Prozac 40 mg daily for depression, Xanax 1 mg 4 times daily for anxiety and Restoril 30 mg at bedtime for sleep.  She will return to see me in 3 months  Collaboration of Care: Collaboration of Care: Primary Care Provider AEB notes will be shared with PCP at patient's request  Patient/Guardian was advised Release of Information must be obtained prior to any record release in order to collaborate their care with an outside provider. Patient/Guardian was advised if they have not already done so to contact the registration department to sign all necessary forms in  order for [place] to release information regarding their care.   Consent: Patient/Guardian gives verbal consent for treatment and assignment of benefits for services provided during this visit. Patient/Guardian expressed understanding and agreed to proceed.    [name], MD 11/14/2021, 11:09 AM

## 2021-11-15 DIAGNOSIS — J441 Chronic obstructive pulmonary disease with (acute) exacerbation: Secondary | ICD-10-CM | POA: Diagnosis not present

## 2021-11-15 DIAGNOSIS — I739 Peripheral vascular disease, unspecified: Secondary | ICD-10-CM | POA: Diagnosis not present

## 2021-11-15 DIAGNOSIS — M069 Rheumatoid arthritis, unspecified: Secondary | ICD-10-CM | POA: Diagnosis not present

## 2021-11-15 DIAGNOSIS — Z299 Encounter for prophylactic measures, unspecified: Secondary | ICD-10-CM | POA: Diagnosis not present

## 2021-11-15 DIAGNOSIS — I471 Supraventricular tachycardia: Secondary | ICD-10-CM | POA: Diagnosis not present

## 2021-11-27 DIAGNOSIS — H25012 Cortical age-related cataract, left eye: Secondary | ICD-10-CM | POA: Diagnosis not present

## 2021-11-27 DIAGNOSIS — H2512 Age-related nuclear cataract, left eye: Secondary | ICD-10-CM | POA: Diagnosis not present

## 2021-12-03 DIAGNOSIS — H2512 Age-related nuclear cataract, left eye: Secondary | ICD-10-CM | POA: Diagnosis not present

## 2021-12-03 DIAGNOSIS — H25812 Combined forms of age-related cataract, left eye: Secondary | ICD-10-CM | POA: Diagnosis not present

## 2021-12-03 DIAGNOSIS — H25012 Cortical age-related cataract, left eye: Secondary | ICD-10-CM | POA: Diagnosis not present

## 2021-12-27 DIAGNOSIS — M069 Rheumatoid arthritis, unspecified: Secondary | ICD-10-CM | POA: Diagnosis not present

## 2021-12-27 DIAGNOSIS — Z713 Dietary counseling and surveillance: Secondary | ICD-10-CM | POA: Diagnosis not present

## 2021-12-27 DIAGNOSIS — Z299 Encounter for prophylactic measures, unspecified: Secondary | ICD-10-CM | POA: Diagnosis not present

## 2021-12-27 DIAGNOSIS — I10 Essential (primary) hypertension: Secondary | ICD-10-CM | POA: Diagnosis not present

## 2022-01-09 ENCOUNTER — Other Ambulatory Visit (INDEPENDENT_AMBULATORY_CARE_PROVIDER_SITE_OTHER): Payer: Self-pay | Admitting: Gastroenterology

## 2022-01-15 ENCOUNTER — Encounter (HOSPITAL_COMMUNITY): Payer: Self-pay | Admitting: Psychiatry

## 2022-01-15 ENCOUNTER — Telehealth (INDEPENDENT_AMBULATORY_CARE_PROVIDER_SITE_OTHER): Payer: Medicare Other | Admitting: Psychiatry

## 2022-01-15 DIAGNOSIS — F331 Major depressive disorder, recurrent, moderate: Secondary | ICD-10-CM | POA: Diagnosis not present

## 2022-01-15 MED ORDER — ALPRAZOLAM 1 MG PO TABS
1.0000 mg | ORAL_TABLET | Freq: Four times a day (QID) | ORAL | 3 refills | Status: DC
Start: 2022-01-15 — End: 2022-05-30

## 2022-01-15 MED ORDER — TEMAZEPAM 30 MG PO CAPS
30.0000 mg | ORAL_CAPSULE | Freq: Every evening | ORAL | 3 refills | Status: DC | PRN
Start: 2022-01-15 — End: 2022-05-30

## 2022-01-15 MED ORDER — FLUOXETINE HCL 40 MG PO CAPS
40.0000 mg | ORAL_CAPSULE | Freq: Every day | ORAL | 3 refills | Status: DC
Start: 2022-01-15 — End: 2022-05-30

## 2022-01-15 NOTE — Progress Notes (Signed)
Virtual Visit via Telephone Note  I connected with [name] on 01/15/22 at  1:40 PM EDT by telephone and verified that I am speaking with the correct person using two identifiers.  Location: Patient: home Provider: office   I discussed the limitations, risks, security and privacy concerns of performing an evaluation and management service by telephone and the availability of in person appointments. I also discussed with the patient that there may be a patient responsible charge related to this service. The patient expressed understanding and agreed to proceed.      I discussed the assessment and treatment plan with the patient. The patient was provided an opportunity to ask questions and all were answered. The patient agreed with the plan and demonstrated an understanding of the instructions.   The patient was advised to call back or seek an in-person evaluation if the symptoms worsen or if the condition fails to improve as anticipated.  I provided 15 minutes of non-face-to-face time during this encounter.   [name], MD  [facility] MD/PA/NP OP Progress Note  01/15/2022 1:33 PM [name]  MRN:  [id number]  Chief Complaint:  Chief Complaint  Patient presents with   Depression   Anxiety   Follow-up   HPI: This patient is a 71 year old widowed white female who lives alone in [place].  She does have family members nearby.  She is on disability.  The patient returns for follow-up after 3 months regarding her depression and anxiety.  Overall she has been doing okay.  She denies any new health issues.  She still has a lot of trouble with arthritic pain and irritable bowel syndrome but they seem to be fairly controlled.  She denies significant depression anxiety thoughts of self-harm or suicide.  Most the time she is sleeping fairly well.  She denies any thoughts of self-harm or suicide Visit Diagnosis:    ICD-10-CM   1. Major depressive disorder, recurrent episode, moderate (HCC)   F33.1       Past Psychiatric History: none  Past Medical History:  Past Medical History:  Diagnosis Date   Anxiety    Arthritis    COPD (chronic obstructive pulmonary disease) ([place])    Depression    Diverticulitis 04/2012   [facility]   Essential hypertension    Frequent PVCs    Outflow tract, positive in the inferior leads   GERD (gastroesophageal reflux disease)    Hepatitis C    Treated   History of pneumonia    Insomnia    Migraine headache    NASH (nonalcoholic steatohepatitis)    Sleep apnea    Splenomegaly    Thyroid disease    Wears glasses     Past Surgical History:  Procedure Laterality Date   ABDOMINAL HYSTERECTOMY     ANKLE SURGERY Right    BIOPSY  10/05/2020   Procedure: BIOPSY;  Surgeon: Montez Morita, Quillian Quince, MD;  [facility]: [facility];  Service: Gastroenterology;;   COLON SURGERY     "part of color removed"   COLONOSCOPY     COLONOSCOPY WITH PROPOFOL N/A 11/13/2020   Castaneda:single non bleeding colonic angiodysplastic lesion, diverticulosis in sigmoid, non bleeding external hemorrhoids, no specimens   ESOPHAGEAL MANOMETRY N/A 04/10/2014   Procedure: ESOPHAGEAL MANOMETRY (EM);  Surgeon: Gatha Mayer, MD;  Location: [facility];  Service: Endoscopy;  Laterality: N/A;   ESOPHAGOGASTRODUODENOSCOPY     ESOPHAGOGASTRODUODENOSCOPY (EGD) WITH PROPOFOL N/A 10/05/2020   Castaneda: barrett's esophagus, short segment, 1cm hh,  normal stomach, normal duodenum, biopsied, normal, no varices   INCISIONAL HERNIA REPAIR N/A 10/24/2015   Procedure: LAPAROSCOPIC REPAIR INCISIONAL HERNIA WITH MESH;  Surgeon: Georganna Skeans, MD;  Location: [place];  Service: General;  Laterality: N/A;   INSERTION OF MESH N/A 10/24/2015   Procedure: INSERTION OF MESH;  Surgeon: Georganna Skeans, MD;  Location: [place];  Service: General;  Laterality: N/A;   JOINT REPLACEMENT     KNEE SURGERY Right    x4   [place] IMPEDANCE STUDY N/A 04/10/2014   Procedure: [place] IMPEDANCE STUDY;   Surgeon: Gatha Mayer, MD;  Location: [facility];  Service: Endoscopy;  Laterality: N/A;   TOTAL SHOULDER ARTHROPLASTY Left 04/22/2018   Procedure: Left total shoulder arthroplasty;  Surgeon: Justice Britain, MD;  Location: [facility];  Service: Orthopedics;  Laterality: Left;  154mn    Family Psychiatric History: See below  Family History:  Family History  Problem Relation Age of Onset   Alcohol abuse Father    Dementia Father    Dementia Maternal Grandmother    Bipolar disorder Daughter    Anxiety disorder Daughter    Depression Brother    Drug abuse Brother    Alcohol abuse Paternal Uncle    Depression Paternal Uncle    ADD / ADHD Neg Hx     Social History:  Social History   Socioeconomic History   Marital status: Widowed    Spouse name: Not on file   Number of children: 2   Years of education: 12th grade   Highest education level: Not on file  Occupational History   Occupation: RETIRED  Tobacco Use   Smoking status: Never    Passive exposure: Current   Smokeless tobacco: Never  Vaping Use   Vaping Use: Never used  Substance and Sexual Activity   Alcohol use: Yes    Alcohol/week: 3.0 standard drinks of alcohol    Types: 3 Cans of beer per week    Comment: occasionally   Drug use: No   Sexual activity: Never  Other Topics Concern   Not on file  Social History Narrative   Not on file   Social Determinants of Health   Financial Resource Strain: Not on file  Food Insecurity: Not on file  Transportation Needs: Not on file  Physical Activity: Not on file  Stress: Not on file  Social Connections: Not on file    Allergies:  Allergies  Allergen Reactions   Levothyroxine Hives and Itching   Tylenol [Acetaminophen] Other (See Comments)    Liver function per MD    Nabumetone Rash    Metabolic Disorder Labs: No results found for: "HGBA1C", "MPG" No results found for: "PROLACTIN" No results found for: "CHOL", "TRIG", "HDL", "CHOLHDL", "VLDL",  "LDLCALC" Lab Results  Component Value Date   TSH 3.26 10/17/2019   TSH 4.540 (H) 10/28/2007    Therapeutic Level Labs: No results found for: "LITHIUM" No results found for: "VALPROATE" No results found for: "CBMZ"  Current Medications: Current Outpatient Medications  Medication Sig Dispense Refill   albuterol (PROVENTIL HFA;VENTOLIN HFA) 108 (90 Base) MCG/ACT inhaler Inhale 2 puffs into the lungs every 6 (six) hours as needed for wheezing or shortness of breath.     albuterol (PROVENTIL) (2.5 MG/3ML) 0.083% nebulizer solution Take 2.5 mg by nebulization every 6 (six) hours as needed for wheezing or shortness of breath.     ALPRAZolam (XANAX) 1 MG tablet Take 1 tablet (1 mg total) by mouth 4 (four) times daily. 1[place]  tablet 3   amitriptyline (ELAVIL) 10 MG tablet TAKE ONE TABLET BY MOUTH AT BEDTIME 90 tablet 3   Ascorbic Acid (VITAMIN C) 1000 MG tablet Take 1,000 mg by mouth daily.     aspirin EC 81 MG tablet Take 81 mg by mouth daily.     cyanocobalamin 1000 MCG tablet Take 2,000 mcg by mouth daily.     cyclobenzaprine (FLEXERIL) 10 MG tablet Take 10 mg by mouth every 8 (eight) hours as needed for muscle spasms. (Patient not taking: Reported on 08/01/2021)     cycloSPORINE (RESTASIS) 0.05 % ophthalmic emulsion Place 1 drop into both eyes 2 (two) times daily as needed (dry eyes).     ELDERBERRY PO Take 1 each by mouth daily.     ergocalciferol (VITAMIN D2) 1.25 MG (50000 UT) capsule Take 50,000 Units by mouth 2 (two) times a week.     FLUoxetine (PROZAC) 40 MG capsule Take 1 capsule (40 mg total) by mouth daily. 30 capsule 3   Fluticasone-Umeclidin-Vilant 100-62.5-25 MCG/INH AEPB Inhale 1 puff into the lungs daily as needed (asthma).     gabapentin (NEURONTIN) 100 MG capsule Take 300 mg by mouth 3 (three) times daily.     hyoscyamine (LEVSIN) 0.125 MG tablet Take 1 tablet (0.125 mg total) by mouth every 8 (eight) hours as needed (abdominal pain). 30 tablet 2   LINZESS 290 MCG CAPS capsule  TAKE ONE CAPSULE BY MOUTH ONCE DAILY BEFORE BREAKFAST. 90 capsule 3   meloxicam (MOBIC) 15 MG tablet Take 1 tablet (15 mg total) by mouth daily. (Patient taking differently: Take 15 mg by mouth at bedtime.) 30 tablet 1   metoprolol succinate (TOPROL-XL) 100 MG 24 hr tablet TAKE ONE TABLET BY MOUTH TWICE DAILY WITH OR IMMEDIATELY FOLLOWING A MEAL. 180 tablet 1   Multiple Vitamin (MULTI-VITAMIN PO) Take 1 tablet by mouth daily.     nystatin (MYCOSTATIN) powder Apply 1 g topically daily.  2   Omega-3 Fatty Acids (FISH OIL) 1200 MG CAPS Take 2,400 mg by mouth daily.     omeprazole (PRILOSEC) 40 MG capsule Take 1 capsule (40 mg total) by mouth daily. 30 capsule 3   rosuvastatin (CRESTOR) 10 MG tablet Take 10 mg by mouth daily.     Simethicone (GAS RELIEF PO) Take 2 capsules by mouth as needed (flatulence).      temazepam (RESTORIL) 30 MG capsule Take 1 capsule (30 mg total) by mouth at bedtime as needed. for sleep 30 capsule 3   traMADol (ULTRAM) 50 MG tablet Take 50 mg by mouth 3 (three) times daily as needed for severe pain.     No current facility-administered medications for this visit.     Musculoskeletal: Strength & Muscle Tone: na Gait & Station: na Patient leans: N/A  Psychiatric Specialty Exam: Review of Systems  Gastrointestinal:  Positive for constipation.  Musculoskeletal:  Positive for arthralgias and myalgias.  All other systems reviewed and are negative.   There were no vitals taken for this visit.There is no height or weight on file to calculate BMI.  General Appearance: NA  Eye Contact:  NA  Speech:  Clear and Coherent  Volume:  Normal  Mood:  Euthymic  Affect:  NA  Thought Process:  Goal Directed  Orientation:  Full (Time, Place, and Person)  Thought Content: WDL   Suicidal Thoughts:  No  Homicidal Thoughts:  No  Memory:  Immediate;   Good Recent;   Good Remote;   Good  Judgement:  Good  Insight:  Fair  Psychomotor Activity:  Decreased  Concentration:   Concentration: Good and Attention Span: Good  Recall:  Good  Fund of Knowledge: Good  Language: Good  Akathisia:  No  Handed:  Right  AIMS (if indicated): not done  Assets:  Communication Skills Desire for Improvement Resilience Social Support Talents/Skills  ADL's:  Intact  Cognition: WNL  Sleep:  Good   Screenings: PHQ2-9    Flowsheet Row Video Visit from 11/14/2021 in [place] Video Visit from 07/29/2021 in [facility] Video Visit from 05/07/2021 in [facility] Video Visit from 12/31/2020 in [place] Video Visit from 08/27/2020 in [facility]  PHQ-2 Total Score 0 1 1 0 1      Flowsheet Row Video Visit from 11/14/2021 in [place] Video Visit from 07/29/2021 in [facility] [place] Video Visit from 05/07/2021 in [place] No Risk No Risk No Risk        Assessment and Plan: This patient is a 71 year old female with a history of depression and anxiety.  She continues to do well on her current regimen.  She will continue Prozac 40 mg daily for depression, Xanax 1 mg 4 times daily for anxiety and Restoril 30 mg at bedtime for sleep.  She will return to see me in 4 months  Collaboration of Care: Collaboration of Care: Primary Care Provider AEB notes will be shared with PCP at patient's request  Patient/Guardian was advised Release of Information must be obtained prior to any record release in order to collaborate their care with an outside provider. Patient/Guardian was advised if they have not already done so to contact the registration department to sign all necessary forms in order for [place] to release information regarding their  care.   Consent: Patient/Guardian gives verbal consent for treatment and assignment of benefits for services provided during this visit. Patient/Guardian expressed understanding and agreed to proceed.    [name], MD 01/15/2022, 1:33 PM

## 2022-01-31 ENCOUNTER — Encounter (INDEPENDENT_AMBULATORY_CARE_PROVIDER_SITE_OTHER): Payer: Self-pay | Admitting: *Deleted

## 2022-02-03 ENCOUNTER — Ambulatory Visit (INDEPENDENT_AMBULATORY_CARE_PROVIDER_SITE_OTHER): Payer: Medicare Other | Admitting: Gastroenterology

## 2022-02-03 ENCOUNTER — Encounter (INDEPENDENT_AMBULATORY_CARE_PROVIDER_SITE_OTHER): Payer: Self-pay | Admitting: *Deleted

## 2022-02-03 ENCOUNTER — Encounter (INDEPENDENT_AMBULATORY_CARE_PROVIDER_SITE_OTHER): Payer: Self-pay | Admitting: Gastroenterology

## 2022-02-03 VITALS — BP 125/74 | HR 66 | Temp 98.8°F | Ht 63.0 in | Wt 210.6 lb

## 2022-02-03 DIAGNOSIS — K581 Irritable bowel syndrome with constipation: Secondary | ICD-10-CM

## 2022-02-03 DIAGNOSIS — K219 Gastro-esophageal reflux disease without esophagitis: Secondary | ICD-10-CM

## 2022-02-03 DIAGNOSIS — K7400 Hepatic fibrosis, unspecified: Secondary | ICD-10-CM

## 2022-02-03 DIAGNOSIS — K7581 Nonalcoholic steatohepatitis (NASH): Secondary | ICD-10-CM

## 2022-02-03 MED ORDER — OMEPRAZOLE 40 MG PO CPDR: 40.0000 mg | DELAYED_RELEASE_CAPSULE | Freq: Every day | ORAL | 3 refills | Status: DC

## 2022-02-03 NOTE — Progress Notes (Unsigned)
Referring Provider: Glenda Chroman, MD Primary Care Physician:  Glenda Chroman, MD Primary GI Physician: Jenetta Downer   Chief Complaint  Patient presents with   Gastroesophageal Reflux    Follow up on GERD, Barrett's, NASH, and IBS. Reports doing about the same as last visit. No new symptoms.    HPI:   [name] is a 71 y.o. female with past medical history of Hep C s/p treatment, in remission, anxiety, IBS-C, RA, COPD, GERD, opioid induced constipation, HTN, hypothyroidism, SVT, recurrent diverticulitis s/p partial colectomy and NASH with advanced fibrosis.    Patient presenting today for follow up of GERD, Barrett's NASH, IBS.  History: Patient had previous blood work on 7/12 during hospital visit for her LUQ pain that revealed AST 65, ALT 47, Alk phos 86, plt 99. CT A/P with contrast at that time showed presence of mild left sided diverticulosis, steatosis and cirrhotic morphology of liver with worsening splenomegaly with 16cm in size. Notably, 05/18/20, she had normal elastography, normal IgG, INR, CBC with plt 130, AFP 8.1, neg ASMA, AMA and normal iron stores with undetectable HCVRNA. Patient underwent liver biopsy in august 2022 for further evaluation that revealed mildly active steatohepatitis with presence of advanced fibrosis stage 3/4, recommended patient adhere to mediterranean diet with weight reduction of 5-10% as well as MELD labs/INR/AFP and RUQ [place] Q6 months.  Last seen may 2023, at that time, taking linzes 251mg  runny diarrhea, up to 5-6 episodes per day. She is currently taking medication every 2-3 days. She reports that she tried taking the medication every other day, however, was still having runny stools. Having bloating and constipation in between when she takes it every 2-3 days. She states this morning, she had to strain to have a BM, as last dose was about 2 days ago. Previously was on linzess 1414m daily but felt that she was not having good results with this as she  was still having some constipation and like she had to strain to empty her bowels.  Having GERD with burning in her throat 2-3x/week, despite PPI BID. Having a lot of belching and occasional dysphagia when anxious.  Recommended updating RUQ [place] labs, AFP, INR in July, start trulance 102m37mcontinue exercise and mediterranean diet, start omeprazole 61m70mily.   Present:  Feels that GERD is maybe somewhat improved on omeprazole daily, she is unsure how often she is having symptoms. States she takes tums maybe 1-2x/week. Denies any nausea or vomiting. She notes that she has lost a few pounds, states she does not really feel like eating, though unsure why. she notes some R flank pain that has been present for the past few weeks. She also notes some burning when urinating. Denies fevers or chills. She also notes more issues with neuropathy over the past week or so two.   She tried trulance without good results. Takes linzess every 1-2 days. Having 2-3 BMs on the days she takes linzess. Denies diarrhea. She notes that she does not tolerate the linzess daily as it gives her diarrhea. She denies rectal bleeding or melena. She notes some bloating at times when she has not moved her bowels. No overt swelling to abdomen. She has had some intermittent swelling to LEs for the past month or so.  Denies changes to skin color or itching. Denies confusion   Previous MELD: 6, AFP 7.6  Last liver imaging: RUQ [place] 6[place] common bile duct is dilated to 8.6 mm today versus 7.9 mm  on the November 2022 comparison.Diffuse heterogeneous echogenicity in the liver consistent with known hepatic steatosis.No liver masses are identified. MRCP 09/05/21  Diffuse hepatic steatosis with relative hypertrophy of the lateral segment of left hepatic lobe and squaring of the caudate lobe. The liver contour appears slightly irregular. Imaging findings may be seen in the setting of early cirrhosis. Fusiform dilatation of the common bile duct  which measures up to 8 mm. Not significantly changed when compared with CT from 09/25/2020. No signs of choledocholithiasis or obstructing mass identified. Marked splenomegaly and increased upper abdominal varicosities compatible with stigmata of portal venous hypertension. Gastric emptying study August 2022: normal Last Colonoscopy:11/13/20- A single non-bleeding colonic angiodysplastic lesion. - Diverticulosis in the sigmoid colon. - Non-bleeding external internal hemorrhoids. - No specimens collected.  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 (Short segment barretts) - 1 cm hiatal hernia. - Normal stomach. - Normal examined duodenum. Biopsied-normal No presence of varices  Recommendations:  Repeat EGD in July 2024 for varices/2027 years for barrett's Repeat colonoscopy august 2032  Past Medical History:  Diagnosis Date   Anxiety    Arthritis    COPD (chronic obstructive pulmonary disease) ([place])    Depression    Diverticulitis 04/2012   [facility]   Essential hypertension    Frequent PVCs    Outflow tract, positive in the inferior leads   GERD (gastroesophageal reflux disease)    Hepatitis C    Treated   History of pneumonia    Insomnia    Migraine headache    NASH (nonalcoholic steatohepatitis)    Sleep apnea    Splenomegaly    Thyroid disease    Wears glasses     Past Surgical History:  Procedure Laterality Date   ABDOMINAL HYSTERECTOMY     ANKLE SURGERY Right    BIOPSY  10/05/2020   Procedure: BIOPSY;  Surgeon: Montez Morita, Quillian Quince, MD;  Location: [facility];  Service: Gastroenterology;;   COLON SURGERY     "part of color removed"   COLONOSCOPY     COLONOSCOPY WITH PROPOFOL N/A 11/13/2020   Castaneda:single non bleeding colonic angiodysplastic lesion, diverticulosis in sigmoid, non bleeding external hemorrhoids, no specimens    ESOPHAGEAL MANOMETRY N/A 04/10/2014   Procedure: ESOPHAGEAL MANOMETRY (EM);  Surgeon: Gatha Mayer, MD;  Location: [facility];  Service: Endoscopy;  Laterality: N/A;   ESOPHAGOGASTRODUODENOSCOPY     ESOPHAGOGASTRODUODENOSCOPY (EGD) WITH PROPOFOL N/A 10/05/2020   Castaneda: barrett's esophagus, short segment, 1cm hh, normal stomach, normal duodenum, biopsied, normal, no varices   INCISIONAL HERNIA REPAIR N/A 10/24/2015   Procedure: LAPAROSCOPIC REPAIR INCISIONAL HERNIA WITH MESH;  Surgeon: Georganna Skeans, MD;  Location: [place];  Service: General;  Laterality: N/A;   INSERTION OF MESH N/A 10/24/2015   Procedure: INSERTION OF MESH;  Surgeon: Georganna Skeans, MD;  Location: [place];  Service: General;  Laterality: N/A;   JOINT REPLACEMENT     KNEE SURGERY Right    x4   [place] IMPEDANCE STUDY N/A 04/10/2014   Procedure: [place] IMPEDANCE STUDY;  Surgeon: Gatha Mayer, MD;  Location: [facility];  Service: Endoscopy;  Laterality: N/A;   TOTAL SHOULDER ARTHROPLASTY Left 04/22/2018   Procedure: Left total shoulder arthroplasty;  Surgeon: Justice Britain, MD;  Location: [facility];  Service: Orthopedics;  Laterality: Left;  135mn    Current Outpatient Medications  Medication Sig Dispense Refill   albuterol (PROVENTIL HFA;VENTOLIN HFA) 108 (90 Base)  MCG/ACT inhaler Inhale 2 puffs into the lungs every 6 (six) hours as needed for wheezing or shortness of breath.     albuterol (PROVENTIL) (2.5 MG/3ML) 0.083% nebulizer solution Take 2.5 mg by nebulization every 6 (six) hours as needed for wheezing or shortness of breath.     ALPRAZolam (XANAX) 1 MG tablet Take 1 tablet (1 mg total) by mouth 4 (four) times daily. 120 tablet 3   amitriptyline (ELAVIL) 10 MG tablet TAKE ONE TABLET BY MOUTH AT BEDTIME 90 tablet 3   Ascorbic Acid (VITAMIN C) 1000 MG tablet Take 1,000 mg by mouth daily.     aspirin EC 81 MG tablet Take 81 mg by mouth daily.     cyanocobalamin 1000 MCG tablet Take 2,000 mcg by mouth daily.     ELDERBERRY  PO Take 1 each by mouth daily.     ergocalciferol (VITAMIN D2) 1.25 MG (50000 UT) capsule Take 50,000 Units by mouth 2 (two) times a week.     FLUoxetine (PROZAC) 40 MG capsule Take 1 capsule (40 mg total) by mouth daily. 30 capsule 3   Fluticasone-Umeclidin-Vilant 100-62.5-25 MCG/INH AEPB Inhale 1 puff into the lungs daily as needed (asthma).     gabapentin (NEURONTIN) 100 MG capsule Take 300 mg by mouth 3 (three) times daily.     hyoscyamine (LEVSIN) 0.125 MG tablet Take 1 tablet (0.125 mg total) by mouth every 8 (eight) hours as needed (abdominal pain). 30 tablet 2   LINZESS 290 MCG CAPS capsule TAKE ONE CAPSULE BY MOUTH ONCE DAILY BEFORE BREAKFAST. 90 capsule 3   meloxicam (MOBIC) 15 MG tablet Take 1 tablet (15 mg total) by mouth daily. (Patient taking differently: Take 15 mg by mouth at bedtime.) 30 tablet 1   metoprolol succinate (TOPROL-XL) 100 MG 24 hr tablet TAKE ONE TABLET BY MOUTH TWICE DAILY WITH OR IMMEDIATELY FOLLOWING A MEAL. 180 tablet 1   Multiple Vitamin (MULTI-VITAMIN PO) Take 1 tablet by mouth daily.     nystatin (MYCOSTATIN) powder Apply 1 g topically daily.  2   omeprazole (PRILOSEC) 40 MG capsule Take 1 capsule (40 mg total) by mouth daily. 30 capsule 3   rosuvastatin (CRESTOR) 10 MG tablet Take 10 mg by mouth daily.     Simethicone (GAS RELIEF PO) Take 2 capsules by mouth as needed (flatulence).      temazepam (RESTORIL) 30 MG capsule Take 1 capsule (30 mg total) by mouth at bedtime as needed. for sleep 30 capsule 3   traMADol (ULTRAM) 50 MG tablet Take 50 mg by mouth 3 (three) times daily as needed for severe pain.     No current facility-administered medications for this visit.    Allergies as of 02/03/2022 - Review Complete 02/03/2022  Allergen Reaction Noted   Levothyroxine Hives and Itching 11/25/2013   Tylenol [acetaminophen] Other (See Comments) 10/18/2015   Nabumetone Rash 10/11/2019    Family History  Problem Relation Age of Onset   Alcohol abuse Father     Dementia Father    Dementia Maternal Grandmother    Bipolar disorder Daughter    Anxiety disorder Daughter    Depression Brother    Drug abuse Brother    Alcohol abuse Paternal Uncle    Depression Paternal Uncle    ADD / ADHD Neg Hx     Social History   Socioeconomic History   Marital status: Widowed    Spouse name: Not on file   Number of children: 2   Years of education: 12th  grade   Highest education level: Not on file  Occupational History   Occupation: RETIRED  Tobacco Use   Smoking status: Never    Passive exposure: Current   Smokeless tobacco: Never  Vaping Use   Vaping Use: Never used  Substance and Sexual Activity   Alcohol use: Yes    Alcohol/week: 3.0 standard drinks of alcohol    Types: 3 Cans of beer per week    Comment: occasionally   Drug use: No   Sexual activity: Never  Other Topics Concern   Not on file  Social History Narrative   Not on file   Social Determinants of Health   Financial Resource Strain: Not on file  Food Insecurity: Not on file  Transportation Needs: Not on file  Physical Activity: Not on file  Stress: Not on file  Social Connections: Not on file   Review of systems General: negative for malaise, night sweats, fever, chills, weight loss Neck: Negative for lumps, goiter, pain and significant neck swelling Resp: Negative for cough, wheezing, dyspnea at rest CV: Negative for chest pain, leg swelling, palpitations, orthopnea GI: denies melena, hematochezia, nausea, vomiting, diarrhea, constipation, dysphagia, odyonophagia, early satiety or unintentional weight loss. +gerd +constipation  GU: R flank pain MSK: Negative for joint pain or swelling, back pain, and muscle pain.  Derm: Negative for itching or rash Psych: Denies depression, anxiety, memory loss, confusion. No homicidal or suicidal ideation.  Heme: Negative for prolonged bleeding, bruising easily, and swollen nodes. Endocrine: Negative for cold or heat intolerance,  polyuria, polydipsia and goiter. Neuro: negative for tremor, gait imbalance, syncope and seizures. The remainder of the review of systems is noncontributory.  Physical Exam: BP 125/74 (BP Location: Left Arm, Patient Position: Sitting, Cuff Size: Large)   Pulse 66   Temp 98.8 F (37.1 C) (Oral)   Ht _0  (1.6 m)   Wt 210 lb 9.6 oz (95.5 kg)   BMI 37.31 kg/m  General:   Alert and oriented. No distress noted. Pleasant and cooperative.  Head:  Normocephalic and atraumatic. Eyes:  Conjuctiva clear without scleral icterus. Mouth:  Oral mucosa pink and moist. Good dentition. No lesions. Heart: Normal rate and rhythm, s1 and s2 heart sounds present.  Lungs: Clear lung sounds in all lobes. Respirations equal and unlabored. Abdomen:  +BS, soft, non-tender and non-distended. No rebound or guarding. No HSM or masses noted. Derm: No palmar erythema or jaundice Msk:  Symmetrical without gross deformities. Normal posture. +CVA tenderness  Extremities:  Without edema. Neurologic:  Alert and  oriented x4 Psych:  Alert and cooperative. Normal mood and affect.  Invalid input(s): "6 MONTHS"   ASSESSMENT: [name] is a 71 y.o. female presenting today for follow up of GERD/Barrett's, NASH and IBS-C.  GERD/Barrett's:doing better on Omeprazole 45m daily. Has occasional breakthrough reflux symptoms maybe 1-2x/week, taking tums when this occurs. Discussed importance of good management of her GERD symptoms given hx of GERD. At this time, patient does not wish to increase PPI or add H2B in. Will continue omeprazole 40 mg daily and Should continue with reflux precautions to include avoiding greasy, spicy, fried, citrus foods, and be mindful that caffeine, carbonated drinks, and chocolate can worsen symptoms.  Stay upright 2-3 hours after eating, prior to lying down and avoid eating late in the evenings. Due for updated EGD for barrett's surveillance in July 2024  NASH with advanced fibrosis: no swelling  to abdomen, pruritus, jaundice or episodes of confusion. Liver biopsy in august 2022  with signs of advanced fibrosis but not cirrhosis, will continue with 6 month monitoring with MELD labs/INR, AFP and RUQ [place]. She is due for RUQ [place] in December, will also update CMP, CBC, AFP and INR in December.  I encouraged her again to exercise as possible and implement mediterranean diet with importance in reduction of weight by 5-10% overall to prevent worsening of her fibrosis. She was again counseled on use of NSAIDs and ETOH in presence of NASH and advanced fibrosis and advised to avoid these.   IBS-C: taking linzess 290 every few days as she does not tolerate it everyday. Having a BM 2-3x/day on the days she takes her linzess. Did not have results on linzess 12mg. She did not get results from trulance samples given at last visit. Feels that this regimen works well for her currently. Denies rectal bleeding or melena. Should continue with good water intake, diet high in fruits and veggies.   In regards to R flank pain, she is also having some burning with urination. I encouraged her to contact her PCP for further evaluation of this.   PLAN:  RUQ [place] de[place] 2. Update CMP, INR, AFP, CBC in december 3. Repeat EGD July 2024 4. Continue omeprazole 430mdaily, reflux precautions 5. Continue linzess 29080mevery 1-2 days  All questions were answered, patient verbalized understanding and is in agreement with plan as outlined above.    Follow Up: 6 months   Joani Cosma L. CarAlver SorrowSN, APRN, AGNP-C Adult-Gerontology Nurse Practitioner [facility]  I have reviewed the note and agree with the APP's assessment as described in this progress note  DanMaylon PeppersD Gastroenterology and Hepatology [facility]

## 2022-02-03 NOTE — Patient Instructions (Signed)
-  We will update US of the liver and labs in December Continue omeprazole 62m once daily,  please let me know if acid reflux symptoms become more frequent. continue to Avoid greasy, spicy, fried, citrus foods, and be mindful that caffeine, carbonated drinks, chocolate and alcohol can increase reflux symptoms. Stay upright 2-3 hours after eating, prior to lying down and avoid eating late in the evenings. -Continue your linzess every few days with good results -As discussed previously, I am providing the mediterranean diet, this is a guideline to help you know which foods you should eat and those you should try to limit or avoid. It is important to make sure you are getting atleast 30 minutes of exercise 4-5x/week You should aim for 5-7% decrease in overall weight. Please avoid alcohol as this can worsen liver issues.   Follow up 6 months

## 2022-02-04 DIAGNOSIS — Z299 Encounter for prophylactic measures, unspecified: Secondary | ICD-10-CM | POA: Diagnosis not present

## 2022-02-04 DIAGNOSIS — I10 Essential (primary) hypertension: Secondary | ICD-10-CM | POA: Diagnosis not present

## 2022-02-04 DIAGNOSIS — N390 Urinary tract infection, site not specified: Secondary | ICD-10-CM | POA: Diagnosis not present

## 2022-02-04 DIAGNOSIS — R600 Localized edema: Secondary | ICD-10-CM | POA: Diagnosis not present

## 2022-02-09 NOTE — Progress Notes (Signed)
Cardiology Office Note  Date: 02/10/2022   ID: [name], DOB December 29, 1950, MRN [id number]  PCP:  Glenda Chroman, MD  Cardiologist:  Rozann Lesches, MD Electrophysiologist:  None   Chief Complaint  Patient presents with   Cardiac follow-up    History of Present Illness: [name] is a 71 y.o. female last seen in May.  She is here for a routine visit.  Reports occasional sense of palpitations as before, no chest pain or unexplained syncope.  I reviewed her medications which are outlined below.  She continues on high-dose Toprol-XL, tolerating this well.  We did pursue a follow-up echocardiogram as well as cardiac monitor back in June, results noted below.  She does have frequent PVCs, but no evidence of cardiomyopathy.  These are suspected to be outflow tract in origin and relatively benign.  Past Medical History:  Diagnosis Date   Anxiety    Arthritis    COPD (chronic obstructive pulmonary disease) ([place])    Depression    Diverticulitis 04/2012   [facility]   Essential hypertension    Frequent PVCs    Outflow tract, positive in the inferior leads   GERD (gastroesophageal reflux disease)    Hepatitis C    Treated   History of pneumonia    Insomnia    Migraine headache    NASH (nonalcoholic steatohepatitis)    Sleep apnea    Splenomegaly    Thyroid disease    Wears glasses     Past Surgical History:  Procedure Laterality Date   ABDOMINAL HYSTERECTOMY     ANKLE SURGERY Right    BIOPSY  10/05/2020   Procedure: BIOPSY;  Surgeon: Harvel Quale, MD;  [facility];  Service: Gastroenterology;;   COLON SURGERY     "part of color removed"   COLONOSCOPY     COLONOSCOPY WITH PROPOFOL N/A 11/13/2020   Castaneda:single non bleeding colonic angiodysplastic lesion, diverticulosis in sigmoid, non bleeding external hemorrhoids, no specimens   ESOPHAGEAL MANOMETRY N/A 04/10/2014   Procedure: ESOPHAGEAL MANOMETRY (EM);  Surgeon: Gatha Mayer, MD;  [facility];  Service: Endoscopy;  Laterality: N/A;   ESOPHAGOGASTRODUODENOSCOPY     ESOPHAGOGASTRODUODENOSCOPY (EGD) WITH PROPOFOL N/A 10/05/2020   Castaneda: barrett's esophagus, short segment, 1cm hh, normal stomach, normal duodenum, biopsied, normal, no varices   INCISIONAL HERNIA REPAIR N/A 10/24/2015   Procedure: LAPAROSCOPIC REPAIR INCISIONAL HERNIA WITH MESH;  Surgeon: Georganna Skeans, MD;  Location: [place];  Service: General;  Laterality: N/A;   INSERTION OF MESH N/A 10/24/2015   Procedure: INSERTION OF MESH;  Surgeon: Georganna Skeans, MD;  Location: [place];  Service: General;  Laterality: N/A;   JOINT REPLACEMENT     KNEE SURGERY Right    x4   [place] IMPEDANCE STUDY N/A 04/10/2014   Procedure: [place] IMPEDANCE STUDY;  Surgeon: Gatha Mayer, MD;  [facility];  Service: Endoscopy;  Laterality: N/A;   TOTAL SHOULDER ARTHROPLASTY Left 04/22/2018   Procedure: Left total shoulder arthroplasty;  Surgeon: Justice Britain, MD;  [facility];  Service: Orthopedics;  Laterality: Left;  128mn    Current Outpatient Medications  Medication Sig Dispense Refill   albuterol (PROVENTIL HFA;VENTOLIN HFA) 108 (90 Base) MCG/ACT inhaler Inhale 2 puffs into the lungs every 6 (six) hours as needed for wheezing or shortness of breath.     albuterol (PROVENTIL) (2.5 MG/3ML) 0.083% nebulizer solution Take 2.5 mg by nebulization every 6 (six) hours as needed for  wheezing or shortness of breath.     ALPRAZolam (XANAX) 1 MG tablet Take 1 tablet (1 mg total) by mouth 4 (four) times daily. 120 tablet 3   amitriptyline (ELAVIL) 10 MG tablet TAKE ONE TABLET BY MOUTH AT BEDTIME 90 tablet 3   Ascorbic Acid (VITAMIN C) 1000 MG tablet Take 1,000 mg by mouth daily.     aspirin EC 81 MG tablet Take 81 mg by mouth daily.     cyanocobalamin 1000 MCG tablet Take 2,000 mcg by mouth daily.     ELDERBERRY PO Take 1 each by mouth daily.     ergocalciferol (VITAMIN D2) 1.25 MG (50000 UT) capsule  Take 50,000 Units by mouth 2 (two) times a week.     FLUoxetine (PROZAC) 40 MG capsule Take 1 capsule (40 mg total) by mouth daily. 30 capsule 3   Fluticasone-Umeclidin-Vilant 100-62.5-25 MCG/INH AEPB Inhale 1 puff into the lungs daily as needed (asthma).     gabapentin (NEURONTIN) 300 MG capsule Take 300 mg by mouth 3 (three) times daily.     hyoscyamine (LEVSIN) 0.125 MG tablet Take 1 tablet (0.125 mg total) by mouth every 8 (eight) hours as needed (abdominal pain). 30 tablet 2   LINZESS 290 MCG CAPS capsule TAKE ONE CAPSULE BY MOUTH ONCE DAILY BEFORE BREAKFAST. 90 capsule 3   meloxicam (MOBIC) 15 MG tablet Take 1 tablet (15 mg total) by mouth daily. (Patient taking differently: Take 15 mg by mouth at bedtime.) 30 tablet 1   metoprolol succinate (TOPROL-XL) 100 MG 24 hr tablet TAKE ONE TABLET BY MOUTH TWICE DAILY WITH OR IMMEDIATELY FOLLOWING A MEAL. 180 tablet 1   Multiple Vitamin (MULTI-VITAMIN PO) Take 1 tablet by mouth daily.     nystatin (MYCOSTATIN) powder Apply 1 g topically daily.  2   omeprazole (PRILOSEC) 40 MG capsule Take 1 capsule (40 mg total) by mouth daily. 90 capsule 3   rosuvastatin (CRESTOR) 10 MG tablet Take 10 mg by mouth daily.     Simethicone (GAS RELIEF PO) Take 2 capsules by mouth as needed (flatulence).      temazepam (RESTORIL) 30 MG capsule Take 1 capsule (30 mg total) by mouth at bedtime as needed. for sleep 30 capsule 3   traMADol (ULTRAM) 50 MG tablet Take 50 mg by mouth 3 (three) times daily as needed for severe pain.     No current facility-administered medications for this visit.   Allergies:  Levothyroxine, Tylenol [acetaminophen], and Nabumetone   ROS: No orthopnea or PND.  Physical Exam: VS:  BP 116/70   Pulse (!) 58   Ht 5' 3"  (1.6 m)   Wt 211 lb 12.8 oz (96.1 kg)   SpO2 96%   BMI 37.52 kg/m , BMI Body mass index is 37.52 kg/m.  Wt Readings from Last 3 Encounters:  02/10/22 211 lb 12.8 oz (96.1 kg)  02/03/22 210 lb 9.6 oz (95.5 kg)  08/01/21  218 lb 8 oz (99.1 kg)    General: Patient appears comfortable at rest. HEENT: Conjunctiva and lids normal. Neck: Supple, no elevated JVP or carotid bruits, no thyromegaly. Lungs: Clear to auscultation, nonlabored breathing at rest. Cardiac: Regular rate and rhythm, no S3 or significant systolic murmur, no pericardial rub. Extremities: No pitting edema.  ECG:  An ECG dated 07/31/2021 was personally reviewed today and demonstrated:  Sinus rhythm with prolonged PR interval and ventricular bigeminy.  Recent Labwork: 03/29/2021: BUN 20; Creat 0.72; Potassium 4.2; Sodium 141 08/21/2021: ALT 30; AST 46 10/07/2021: Hemoglobin  13.7; Platelets 63  June 2023: Hemoglobin 15.1, platelets 87, BUN 18, creatinine 0.77, potassium 4.6, AST 58, ALT 42, cholesterol 200, triglycerides 248, HDL 37, LDL 119, TSH 2.41  Other Studies Reviewed Today:  Cardiac monitor June 2023: ZIO XT reviewed 1.  Predominant rhythm is sinus with heart rate ranging from 65 bpm up to 98 bpm and average heart rate 74 bpm. 2.  There were rare PACs including atrial couplets and triplets representing less than 1% total beats. 3.  There were frequent PVCs representing approximately 20% total beats.  Otherwise rare ventricular couplets and triplets representing less than 1% total beats.  Also episodes of ventricular bigeminy and trigeminy. 4.  No sustained arrhythmias or pauses.  Echocardiogram 08/19/2021:  1. Left ventricular ejection fraction, by estimation, is 60 to 65%. The  left ventricle has normal function. The left ventricle has no regional  wall motion abnormalities. Left ventricular diastolic parameters are  consistent with Grade I diastolic  dysfunction (impaired relaxation). Elevated left atrial pressure. The  average left ventricular global longitudinal strain is -18.8 %. The global  longitudinal strain is normal.   2. Right ventricular systolic function is normal. The right ventricular  size is normal. Tricuspid  regurgitation signal is inadequate for assessing  PA pressure.   3. The mitral valve is abnormal. Mild mitral valve regurgitation. No  evidence of mitral stenosis.   4. The aortic valve is tricuspid. There is mild calcification of the  aortic valve. There is mild thickening of the aortic valve. Aortic valve  regurgitation is not visualized. No aortic stenosis is present.   5. The inferior vena cava is normal in size with greater than 50%  respiratory variability, suggesting right atrial pressure of 3 mmHg.   Assessment and Plan:  1.  Frequent PVCs, symptomatically stable on high-dose Toprol-XL.  Echocardiogram in June showed normal LVEF at 60 to 65%, also normal RV contraction.  He does not report any sudden unexplained syncope or chest pain.  Suspect outflow tract origin.  Continue observation for now.  2.  Essential hypertension, blood pressure is well controlled today.  No changes in the remainder of her regimen.  Keep follow-up with PCP.  Medication Adjustments/Labs and Tests Ordered: Current medicines are reviewed at length with the patient today.  Concerns regarding medicines are outlined above.   Tests Ordered: No orders of the defined types were placed in this encounter.   Medication Changes: No orders of the defined types were placed in this encounter.   Disposition:  Follow up  6 months.  Signed, Satira Sark, MD, [facility] 02/10/2022 11:50 AM    [facility] [place], [place], [place] [address] Phone: [phone]; Fax: [phone]

## 2022-02-10 ENCOUNTER — Ambulatory Visit: Payer: Medicare Other | Attending: Cardiology | Admitting: Cardiology

## 2022-02-10 ENCOUNTER — Encounter: Payer: Self-pay | Admitting: Cardiology

## 2022-02-10 VITALS — BP 116/70 | HR 58 | Ht 63.0 in | Wt 211.8 lb

## 2022-02-10 DIAGNOSIS — I10 Essential (primary) hypertension: Secondary | ICD-10-CM

## 2022-02-10 DIAGNOSIS — I493 Ventricular premature depolarization: Secondary | ICD-10-CM | POA: Diagnosis not present

## 2022-02-10 NOTE — Patient Instructions (Signed)

## 2022-02-11 ENCOUNTER — Other Ambulatory Visit (INDEPENDENT_AMBULATORY_CARE_PROVIDER_SITE_OTHER): Payer: Self-pay | Admitting: Gastroenterology

## 2022-02-28 ENCOUNTER — Ambulatory Visit (HOSPITAL_COMMUNITY): Payer: Medicare Other

## 2022-03-14 ENCOUNTER — Ambulatory Visit (HOSPITAL_COMMUNITY): Payer: Medicare Other

## 2022-03-21 ENCOUNTER — Ambulatory Visit (HOSPITAL_COMMUNITY): Payer: Medicare Other

## 2022-03-25 DIAGNOSIS — M1990 Unspecified osteoarthritis, unspecified site: Secondary | ICD-10-CM | POA: Diagnosis not present

## 2022-03-25 DIAGNOSIS — J440 Chronic obstructive pulmonary disease with acute lower respiratory infection: Secondary | ICD-10-CM | POA: Diagnosis not present

## 2022-03-25 DIAGNOSIS — I700 Atherosclerosis of aorta: Secondary | ICD-10-CM | POA: Diagnosis not present

## 2022-03-25 DIAGNOSIS — M069 Rheumatoid arthritis, unspecified: Secondary | ICD-10-CM | POA: Diagnosis not present

## 2022-04-02 DIAGNOSIS — J440 Chronic obstructive pulmonary disease with acute lower respiratory infection: Secondary | ICD-10-CM | POA: Diagnosis not present

## 2022-04-02 DIAGNOSIS — J209 Acute bronchitis, unspecified: Secondary | ICD-10-CM | POA: Diagnosis not present

## 2022-05-19 ENCOUNTER — Telehealth (HOSPITAL_COMMUNITY): Payer: 59 | Admitting: Psychiatry

## 2022-05-30 ENCOUNTER — Telehealth (INDEPENDENT_AMBULATORY_CARE_PROVIDER_SITE_OTHER): Payer: 59 | Admitting: Psychiatry

## 2022-05-30 ENCOUNTER — Encounter (HOSPITAL_COMMUNITY): Payer: Self-pay | Admitting: Psychiatry

## 2022-05-30 DIAGNOSIS — F331 Major depressive disorder, recurrent, moderate: Secondary | ICD-10-CM

## 2022-05-30 DIAGNOSIS — R109 Unspecified abdominal pain: Secondary | ICD-10-CM

## 2022-05-30 MED ORDER — AMITRIPTYLINE HCL 10 MG PO TABS: 10.0000 mg | ORAL_TABLET | Freq: Every day | ORAL | 3 refills | Status: DC

## 2022-05-30 MED ORDER — FLUOXETINE HCL 40 MG PO CAPS: 40.0000 mg | ORAL_CAPSULE | Freq: Every day | ORAL | 3 refills | Status: DC

## 2022-05-30 MED ORDER — TEMAZEPAM 30 MG PO CAPS
30.0000 mg | ORAL_CAPSULE | Freq: Every evening | ORAL | 3 refills | Status: DC | PRN
Start: 2022-05-30 — End: 2022-07-31

## 2022-05-30 MED ORDER — ALPRAZOLAM 1 MG PO TABS: 1.0000 mg | ORAL_TABLET | Freq: Four times a day (QID) | ORAL | 3 refills | Status: DC

## 2022-05-30 NOTE — Progress Notes (Signed)
Virtual Visit via Telephone Note  I connected with [name] on 05/30/22 at 10:20 AM EDT by telephone and verified that I am speaking with the correct person using two identifiers.  Location: Patient: home Provider: office   I discussed the limitations, risks, security and privacy concerns of performing an evaluation and management service by telephone and the availability of in person appointments. I also discussed with the patient that there may be a patient responsible charge related to this service. The patient expressed understanding and agreed to proceed.    I discussed the assessment and treatment plan with the patient. The patient was provided an opportunity to ask questions and all were answered. The patient agreed with the plan and demonstrated an understanding of the instructions.   The patient was advised to call back or seek an in-person evaluation if the symptoms worsen or if the condition fails to improve as anticipated.  I provided 15 minutes of non-face-to-face time during this encounter.   [name], MD  [facility] MD/PA/NP OP Progress Note  05/30/2022 10:24 AM [name]  MRN:  [id number]  Chief Complaint:  Chief Complaint  Patient presents with   Depression   Anxiety   Follow-up   HPI: This patient is a 72 year old widowed white female who lives alone in [place].  She does have family members nearby.  She is on disability.  The patient returns for follow-up after 4 months regarding her depression and anxiety.  She states that she just got over having RSV and a sinus infection.  She was sick for several weeks but is just now starting to get better.  She states that despite this and her chronic pain her mood has been stable and she denies significant depression anxiety thoughts of self-harm or suicide.  She is sleeping well.  She denies feeling off balance or having any falls. Visit Diagnosis:    ICD-10-CM   1. Major depressive disorder, recurrent episode,  moderate (HCC)  F33.1     2. Functional abdominal pain syndrome  R10.9 amitriptyline (ELAVIL) 10 MG tablet      Past Psychiatric History: none  Past Medical History:  Past Medical History:  Diagnosis Date   Anxiety    Arthritis    COPD (chronic obstructive pulmonary disease) ([place])    Depression    Diverticulitis 04/2012   [facility]   Essential hypertension    Frequent PVCs    Outflow tract, positive in the inferior leads   GERD (gastroesophageal reflux disease)    Hepatitis C    Treated   History of pneumonia    Insomnia    Migraine headache    NASH (nonalcoholic steatohepatitis)    Sleep apnea    Splenomegaly    Thyroid disease    Wears glasses     Past Surgical History:  Procedure Laterality Date   ABDOMINAL HYSTERECTOMY     ANKLE SURGERY Right    BIOPSY  10/05/2020   Procedure: BIOPSY;  Surgeon: Monte[place] Morita, Quillian Quince, MD;  [facility];  Service: Gastroenterology;;   COLON SURGERY     "part of color removed"   COLONOSCOPY     COLONOSCOPY WITH PROPOFOL N/A 11/13/2020   Castaneda:single non bleeding colonic angiodysplastic lesion, diverticulosis in sigmoid, non bleeding external hemorrhoids, no specimens   ESOPHAGEAL MANOMETRY N/A 04/10/2014   Procedure: ESOPHAGEAL MANOMETRY (EM);  Surgeon: Gatha Mayer, MD;  [facility];  Service: Endoscopy;  Laterality: N/A;   ESOPHAGOGASTRODUODENOSCOPY  ESOPHAGOGASTRODUODENOSCOPY (EGD) WITH PROPOFOL N/A 10/05/2020   Castaneda: barrett's esophagus, short segment, 1cm hh, normal stomach, normal duodenum, biopsied, normal, no varices   INCISIONAL HERNIA REPAIR N/A 10/24/2015   Procedure: LAPAROSCOPIC REPAIR INCISIONAL HERNIA WITH MESH;  Surgeon: Georganna Skeans, MD;  Location: [place];  Service: General;  Laterality: N/A;   INSERTION OF MESH N/A 10/24/2015   Procedure: INSERTION OF MESH;  Surgeon: Georganna Skeans, MD;  Location: [place];  Service: General;  Laterality: N/A;   JOINT REPLACEMENT      KNEE SURGERY Right    x4   [place] IMPEDANCE STUDY N/A 04/10/2014   Procedure: [place] IMPEDANCE STUDY;  Surgeon: Gatha Mayer, MD;  [facility];  Service: Endoscopy;  Laterality: N/A;   TOTAL SHOULDER ARTHROPLASTY Left 04/22/2018   Procedure: Left total shoulder arthroplasty;  Surgeon: Justice Britain, MD;  [facility];  Service: Orthopedics;  Laterality: Left;  14min    Family Psychiatric History: see below  Family History:  Family History  Problem Relation Age of Onset   Alcohol abuse Father    Dementia Father    Dementia Maternal Grandmother    Bipolar disorder Daughter    Anxiety disorder Daughter    Depression Brother    Drug abuse Brother    Alcohol abuse Paternal Uncle    Depression Paternal Uncle    ADD / ADHD Neg Hx     Social History:  Social History   Socioeconomic History   Marital status: Widowed    Spouse name: Not on file   Number of children: 2   Years of education: 12th grade   Highest education level: Not on file  Occupational History   Occupation: RETIRED  Tobacco Use   Smoking status: Never    Passive exposure: Current   Smokeless tobacco: Never  Vaping Use   Vaping Use: Never used  Substance and Sexual Activity   Alcohol use: Yes    Alcohol/week: 3.0 standard drinks of alcohol    Types: 3 Cans of beer per week    Comment: occasionally   Drug use: No   Sexual activity: Never  Other Topics Concern   Not on file  Social History Narrative   Not on file   Social Determinants of Health   Financial Resource Strain: Not on file  Food Insecurity: Not on file  Transportation Needs: Not on file  Physical Activity: Not on file  Stress: Not on file  Social Connections: Not on file    Allergies:  Allergies  Allergen Reactions   Levothyroxine Hives and Itching   Tylenol [Acetaminophen] Other (See Comments)    Liver function per MD    Nabumetone Rash    Metabolic Disorder Labs: No results found for: "HGBA1C", "MPG" No  results found for: "PROLACTIN" No results found for: "CHOL", "TRIG", "HDL", "CHOLHDL", "VLDL", "LDLCALC" Lab Results  Component Value Date   TSH 3.26 10/17/2019   TSH 4.540 (H) 10/28/2007    Therapeutic Level Labs: No results found for: "LITHIUM" No results found for: "VALPROATE" No results found for: "CBMZ"  Current Medications: Current Outpatient Medications  Medication Sig Dispense Refill   albuterol (PROVENTIL HFA;VENTOLIN HFA) 108 (90 Base) MCG/ACT inhaler Inhale 2 puffs into the lungs every 6 (six) hours as needed for wheezing or shortness of breath.     ALPRAZolam (XANAX) 1 MG tablet Take 1 tablet (1 mg total) by mouth 4 (four) times daily. 120 tablet 3   amitriptyline (ELAVIL) 10 MG tablet Take 1 tablet (10  mg total) by mouth at bedtime. 90 tablet 3   Ascorbic Acid (VITAMIN C) 1000 MG tablet Take 1,000 mg by mouth daily.     aspirin EC 81 MG tablet Take 81 mg by mouth daily.     cyanocobalamin 1000 MCG tablet Take 2,000 mcg by mouth daily.     ELDERBERRY PO Take 1 each by mouth daily.     ergocalciferol (VITAMIN D2) 1.25 MG (50000 UT) capsule Take 50,000 Units by mouth 2 (two) times a week.     FLUoxetine (PROZAC) 40 MG capsule Take 1 capsule (40 mg total) by mouth daily. 30 capsule 3   Fluticasone-Umeclidin-Vilant 100-62.5-25 MCG/INH AEPB Inhale 1 puff into the lungs daily as needed (asthma).     gabapentin (NEURONTIN) 300 MG capsule Take 300 mg by mouth 3 (three) times daily.     hyoscyamine (LEVSIN) 0.125 MG tablet Take 1 tablet (0.125 mg total) by mouth every 8 (eight) hours as needed (abdominal pain). 30 tablet 2   LINZESS 290 MCG CAPS capsule TAKE ONE CAPSULE BY MOUTH ONCE DAILY BEFORE BREAKFAST. 90 capsule 3   meloxicam (MOBIC) 15 MG tablet Take 1 tablet (15 mg total) by mouth daily. (Patient taking differently: Take 15 mg by mouth at bedtime.) 30 tablet 1   metoprolol succinate (TOPROL-XL) 100 MG 24 hr tablet TAKE ONE TABLET BY MOUTH TWICE DAILY WITH OR IMMEDIATELY  FOLLOWING A MEAL. 180 tablet 1   Multiple Vitamin (MULTI-VITAMIN PO) Take 1 tablet by mouth daily.     nystatin (MYCOSTATIN) powder Apply 1 g topically daily.  2   omeprazole (PRILOSEC) 40 MG capsule Take 1 capsule (40 mg total) by mouth daily. 90 capsule 3   rosuvastatin (CRESTOR) 10 MG tablet Take 10 mg by mouth daily.     Simethicone (GAS RELIEF PO) Take 2 capsules by mouth as needed (flatulence).      temazepam (RESTORIL) 30 MG capsule Take 1 capsule (30 mg total) by mouth at bedtime as needed. for sleep 30 capsule 3   traMADol (ULTRAM) 50 MG tablet Take 50 mg by mouth 3 (three) times daily as needed for severe pain.     No current facility-administered medications for this visit.     Musculoskeletal: Strength & Muscle Tone: na Gait & Station: na Patient leans: N/A  Psychiatric Specialty Exam: Review of Systems  Constitutional:  Positive for fatigue.  HENT:  Positive for congestion.   Musculoskeletal:  Positive for arthralgias.  All other systems reviewed and are negative.   There were no vitals taken for this visit.There is no height or weight on file to calculate BMI.  General Appearance: NA  Eye Contact:  NA  Speech:  Clear and Coherent  Volume:  Normal  Mood:  Euthymic  Affect:  NA  Thought Process:  Goal Directed  Orientation:  Full (Time, Place, and Person)  Thought Content: WDL   Suicidal Thoughts:  No  Homicidal Thoughts:  No  Memory:  Immediate;   Good Recent;   Good Remote;   Fair  Judgement:  Good  Insight:  Fair  Psychomotor Activity:  Decreased  Concentration:  Concentration: Good and Attention Span: Good  Recall:  Good  Fund of Knowledge: Good  Language: Good  Akathisia:  No  Handed:  Right  AIMS (if indicated): not done  Assets:  Communication Skills Desire for Improvement Resilience Social Support Talents/Skills  ADL's:  Intact  Cognition: WNL  Sleep:  Good   Screenings: Camera operator Row  Video Visit from 11/14/2021 in [facility] at [place] Video Visit from 07/29/2021 in [facility] at [place] Video Visit from 05/07/2021 in [facility] [place] Video Visit from 12/31/2020 in [facility] at [place] Video Visit from 08/27/2020 in [facility] at [facility] Total Score 0 1 1 0 1      Flowsheet Row Video Visit from 11/14/2021 in [facility] at [place] Video Visit from 07/29/2021 in [facility] at [place] Video Visit from 05/07/2021 in [facility] at [place] No Risk No Risk No Risk        Assessment and Plan: This patient is a 72 year old female with a history of depression and anxiety.  She continues to do well on her current regimen.  She will continue Prozac 40 mg daily for depression, Xanax 1 mg 4 times daily for anxiety and Restoril 30 mg at bedtime for sleep.  She also takes amitriptyline 10 mg at bedtime for sleep.  She will return to see me in 4 months  Collaboration of Care: Collaboration of Care: Primary Care Provider AEB notes will be shared with PCP at patient's request  Patient/Guardian was advised Release of Information must be obtained prior to any record release in order to collaborate their care with an outside provider. Patient/Guardian was advised if they have not already done so to contact the registration department to sign all necessary forms in order for [place] to release information regarding their care.   Consent: Patient/Guardian gives verbal consent for treatment and assignment of benefits for services provided during this visit. Patient/Guardian expressed understanding and agreed to proceed.    [name], MD 05/30/2022, 10:24 AM

## 2022-06-01 IMAGING — US US ABDOMEN LIMITED
1 series · 14 of 25 positions shown · non-contrast
Comparison: Right upper quadrant ultrasound January 31, 2021

CLINICAL DATA: [place].

EXAM:
ULTRASOUND ABDOMEN LIMITED RIGHT UPPER QUADRANT

[Series 1: us abdomen limited ruq (liver/gb) · 14 of 43 slices shown]
[im 1/43]
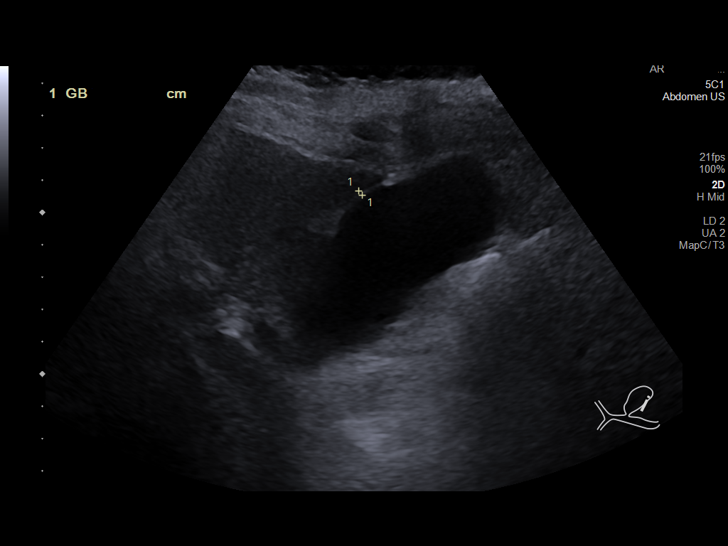
[im 4/43]
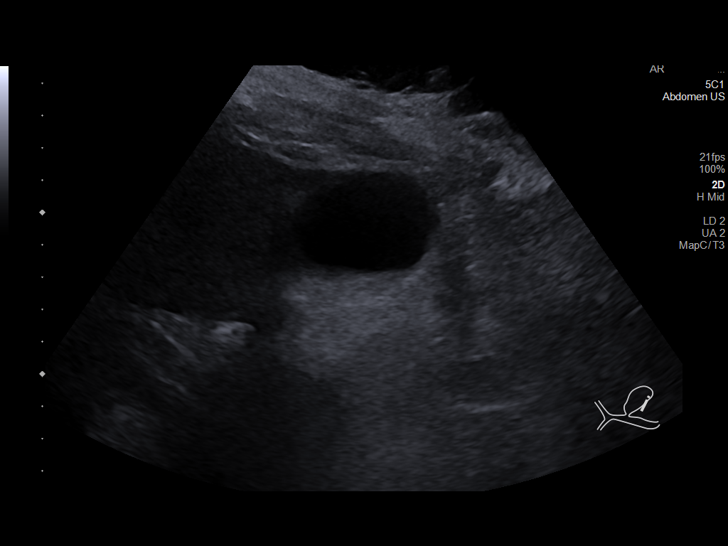
[im 8/43]
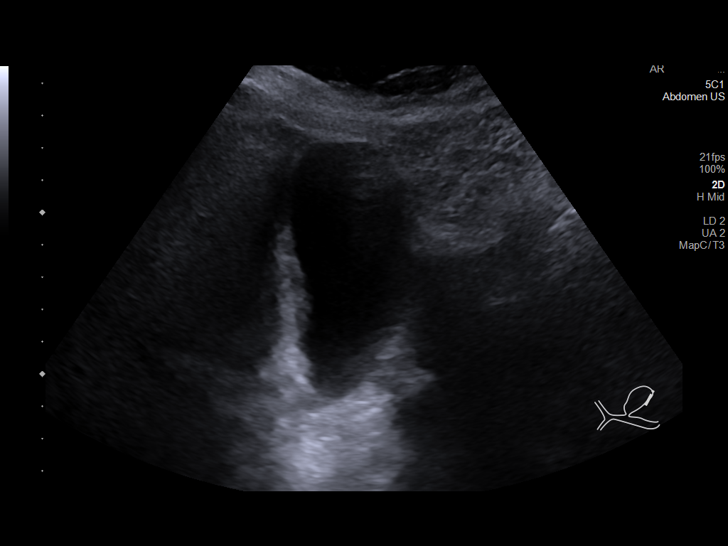
[im 11/43]
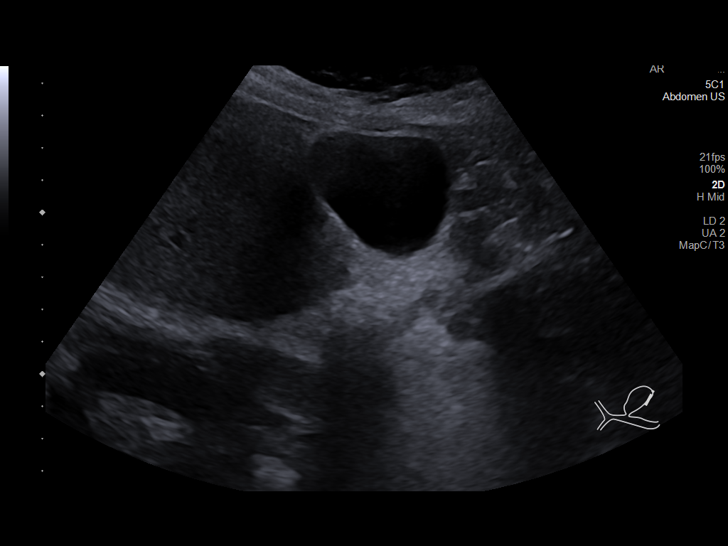
[im 15/43]
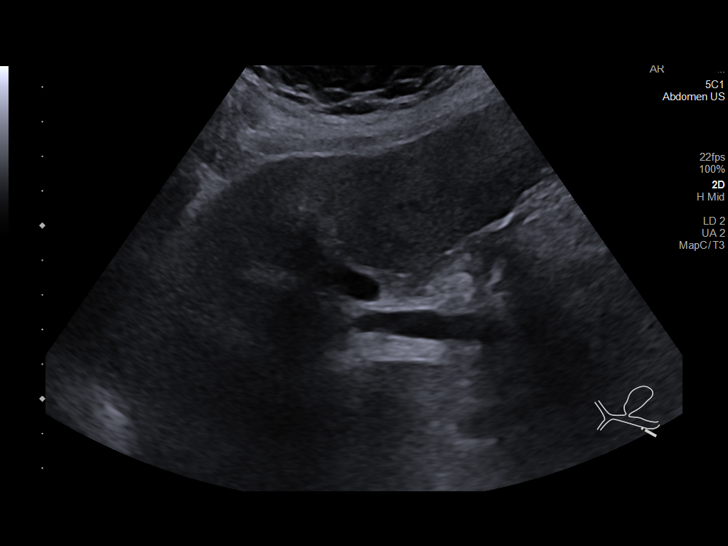
[im 16/43]
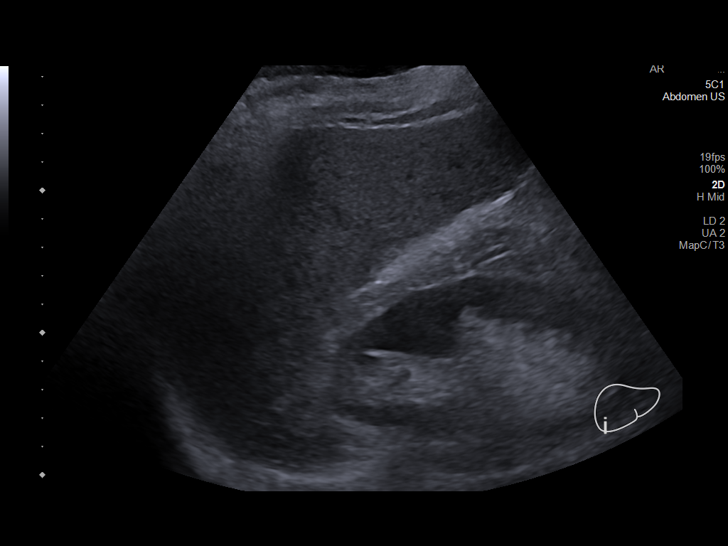
[im 20/43]
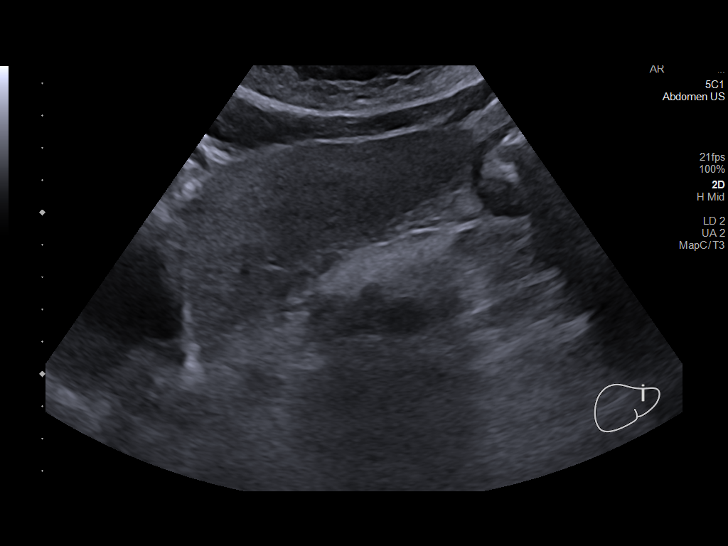
[im 23/43]
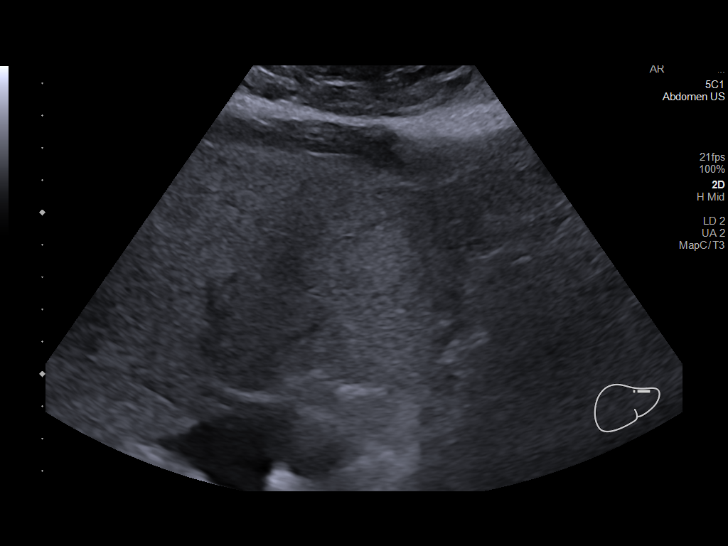
[im 27/43]
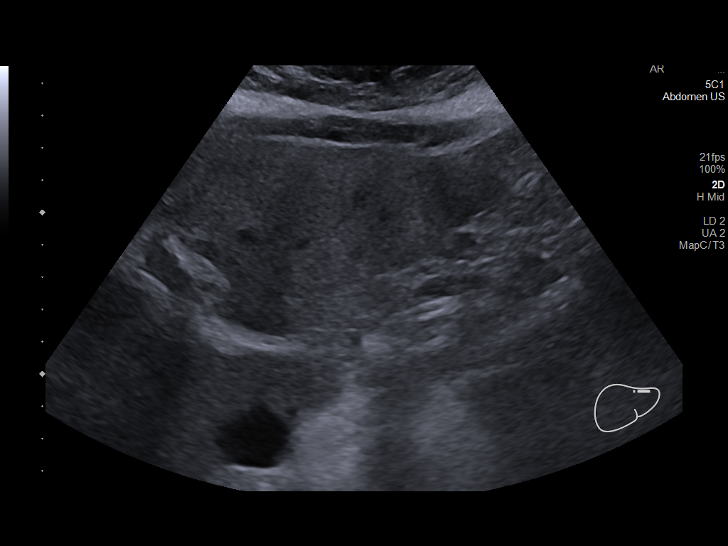
[im 29/43]
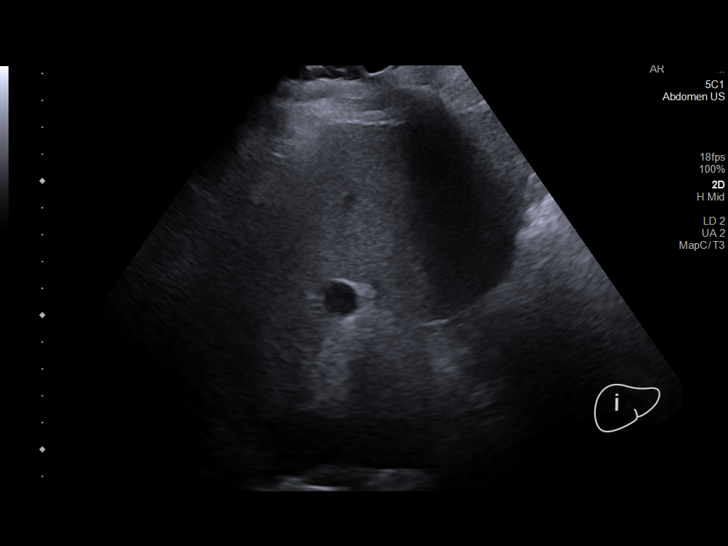
[im 32/43]
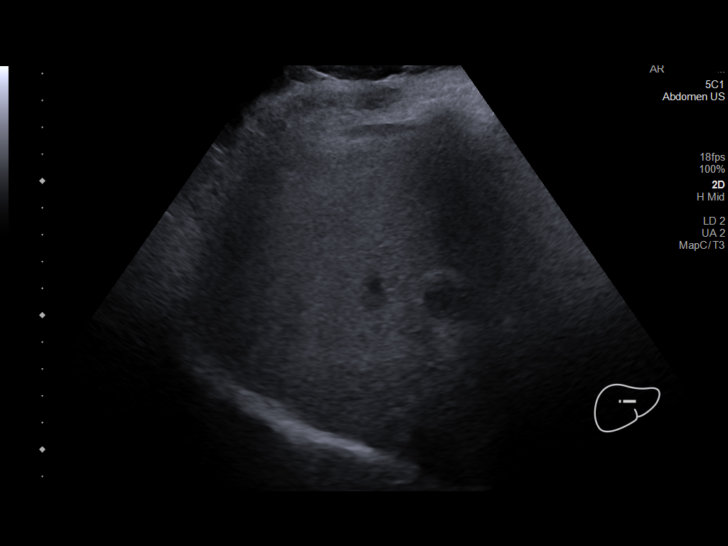
[im 36/43]
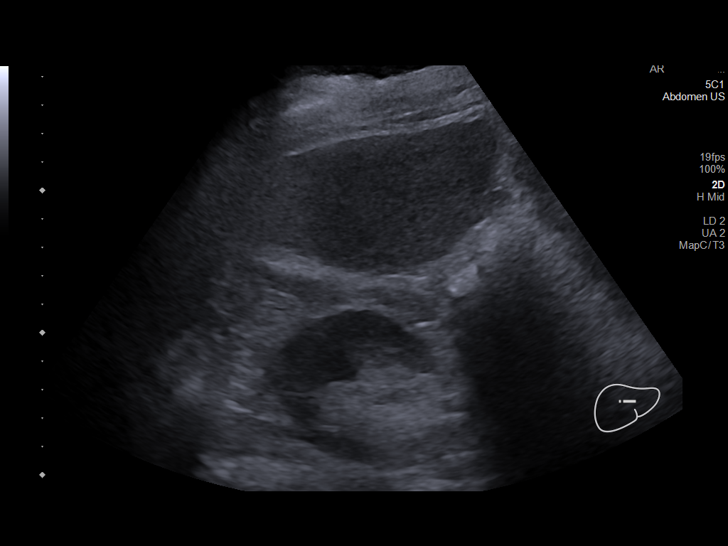
[im 39/43]
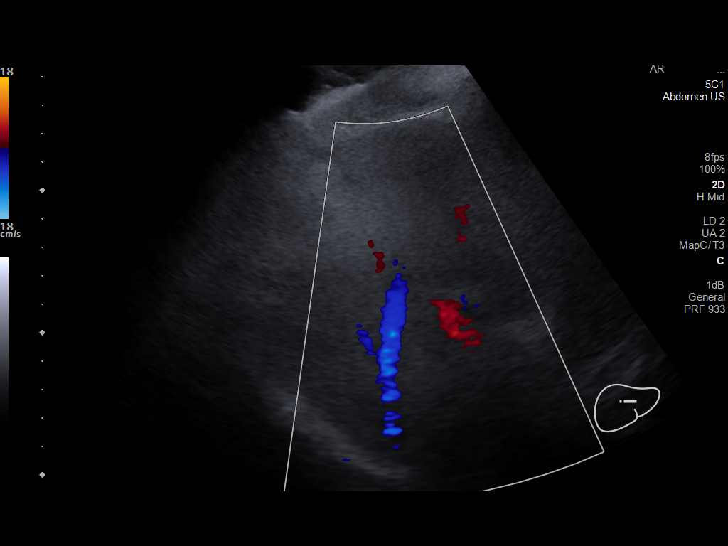
[im 43/43]
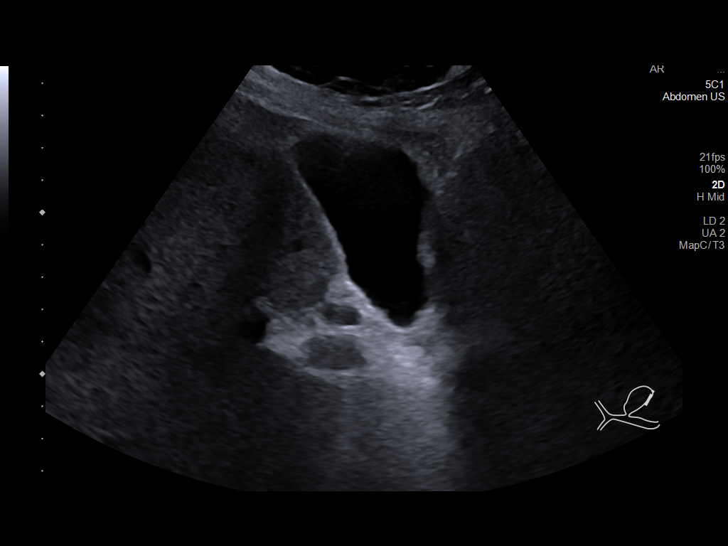

[14 of 25 positions shown; findings below may reference images not displayed]

FINDINGS: Gallbladder:

No gallstones or wall thickening visualized. No sonographic Murphy
sign noted by sonographer.

Common bile duct:

Diameter: 8.6 mm

Liver:

Diffuse increased echogenicity in the liver. No liver mass. Portal
vein is patent on color Doppler imaging with normal direction of
blood flow towards the liver.

Other: None.
IMPRESSION: 1. The common bile duct is dilated to 8.6 mm today versus 7.9 mm on
the January 2021 comparison. Recommend correlation with labs. If
there is clinical or lab evidence of biliary obstruction, recommend
an MRCP or ERCP.
2. Diffuse heterogeneous echogenicity in the liver consistent with
known hepatic steatosis based on previous CT imaging. No liver
masses are identified.

## 2022-07-31 ENCOUNTER — Telehealth: Payer: Self-pay | Admitting: Cardiology

## 2022-07-31 ENCOUNTER — Telehealth (HOSPITAL_COMMUNITY): Payer: Self-pay | Admitting: *Deleted

## 2022-07-31 ENCOUNTER — Other Ambulatory Visit (HOSPITAL_COMMUNITY): Payer: Self-pay | Admitting: Psychiatry

## 2022-07-31 ENCOUNTER — Other Ambulatory Visit (INDEPENDENT_AMBULATORY_CARE_PROVIDER_SITE_OTHER): Payer: Self-pay | Admitting: *Deleted

## 2022-07-31 DIAGNOSIS — I471 Supraventricular tachycardia, unspecified: Secondary | ICD-10-CM | POA: Diagnosis not present

## 2022-07-31 DIAGNOSIS — R109 Unspecified abdominal pain: Secondary | ICD-10-CM

## 2022-07-31 DIAGNOSIS — J441 Chronic obstructive pulmonary disease with (acute) exacerbation: Secondary | ICD-10-CM | POA: Diagnosis not present

## 2022-07-31 DIAGNOSIS — K581 Irritable bowel syndrome with constipation: Secondary | ICD-10-CM

## 2022-07-31 DIAGNOSIS — M069 Rheumatoid arthritis, unspecified: Secondary | ICD-10-CM | POA: Diagnosis not present

## 2022-07-31 DIAGNOSIS — I700 Atherosclerosis of aorta: Secondary | ICD-10-CM | POA: Diagnosis not present

## 2022-07-31 MED ORDER — AMITRIPTYLINE HCL 10 MG PO TABS: 10.0000 mg | ORAL_TABLET | Freq: Every day | ORAL | 3 refills | Status: DC

## 2022-07-31 MED ORDER — FLUOXETINE HCL 40 MG PO CAPS
40.0000 mg | ORAL_CAPSULE | Freq: Every day | ORAL | 3 refills | Status: DC
Start: 2022-07-31 — End: 2022-09-23

## 2022-07-31 MED ORDER — METOPROLOL SUCCINATE ER 100 MG PO TB24: ORAL_TABLET | ORAL | 1 refills | Status: DC

## 2022-07-31 MED ORDER — LINACLOTIDE 290 MCG PO CAPS: ORAL_CAPSULE | ORAL | 0 refills | Status: AC

## 2022-07-31 MED ORDER — OMEPRAZOLE 40 MG PO CPDR: 40.0000 mg | DELAYED_RELEASE_CAPSULE | Freq: Every day | ORAL | 0 refills | Status: DC

## 2022-07-31 MED ORDER — ALPRAZOLAM 1 MG PO TABS: 1.0000 mg | ORAL_TABLET | Freq: Four times a day (QID) | ORAL | 3 refills | Status: DC

## 2022-07-31 MED ORDER — TEMAZEPAM 30 MG PO CAPS: 30.0000 mg | ORAL_CAPSULE | Freq: Every evening | ORAL | 3 refills | Status: DC | PRN

## 2022-07-31 MED ORDER — ROSUVASTATIN CALCIUM 10 MG PO TABS: 10.0000 mg | ORAL_TABLET | Freq: Every day | ORAL | 1 refills | Status: AC

## 2022-07-31 NOTE — Telephone Encounter (Signed)
Patient called stating that [facility] closed and was not able to get her medications before they closed. Per pt she would like for provider to please send in her Xanax, Temazepam, Amitriptyline and Fluoxetine. Per pt she would like her medications to be sent to [facility].

## 2022-07-31 NOTE — Telephone Encounter (Signed)
*  STAT* If patient is at the pharmacy, call can be transferred to refill team.   1. Which medications need to be refilled? (please list name of each medication and dose if known)   rosuvastatin (CRESTOR) 10 MG tablet    metoprolol succinate (TOPROL-XL) 100 MG 24 hr tablet   2. Which pharmacy/location (including street and city if local pharmacy) is medication to be sent to? [facility] [address] [place], [place] [address]  3. Do they need a 30 day or 90 day supply? 90 Day Supply  Pt stated she changed pharmacy to [facility] [address] [place], [place] [address]

## 2022-07-31 NOTE — Telephone Encounter (Signed)
Called number on file. Not able to reach patient.

## 2022-08-04 ENCOUNTER — Encounter (INDEPENDENT_AMBULATORY_CARE_PROVIDER_SITE_OTHER): Payer: Self-pay | Admitting: Gastroenterology

## 2022-08-04 ENCOUNTER — Ambulatory Visit (INDEPENDENT_AMBULATORY_CARE_PROVIDER_SITE_OTHER): Payer: 59 | Admitting: Gastroenterology

## 2022-08-04 VITALS — BP 119/66 | HR 63 | Temp 98.6°F | Ht 63.0 in | Wt 195.4 lb

## 2022-08-04 DIAGNOSIS — K581 Irritable bowel syndrome with constipation: Secondary | ICD-10-CM | POA: Diagnosis not present

## 2022-08-04 DIAGNOSIS — K7400 Hepatic fibrosis, unspecified: Secondary | ICD-10-CM

## 2022-08-04 DIAGNOSIS — K219 Gastro-esophageal reflux disease without esophagitis: Secondary | ICD-10-CM | POA: Diagnosis not present

## 2022-08-04 DIAGNOSIS — K7581 Nonalcoholic steatohepatitis (NASH): Secondary | ICD-10-CM | POA: Diagnosis not present

## 2022-08-04 NOTE — Patient Instructions (Addendum)
-  Increase omeprazole 40mg  to twice daily dosing, you can double up on what you have at home and let me know once you need a refill and I will send prescription for twice daily dosing. Be mindful of greasy, spicy, fried, citrus foods, caffeine, carbonated drinks, and chocolate as these can increase reflux symptoms Stay upright 2-3 hours after eating, prior to lying down and avoid eating late in the evenings. Try to keep your head elevated at night.   -You are due for update liver labs and US of the liver  - we will Schedule EGD for July to monitor for esophageal varices - Continue with linzess as needed - Reduce salt intake to <2 g per day - Can take Tylenol max of 2 g per day (650 mg q8h) for pain - Avoid NSAIDs for pain - Avoid eating raw oysters/shellfish - Ensure protein shake every night before going to sleep  Follow up 6 months   It was a pleasure to see you today. I want to create trusting relationships with patients and provide genuine, compassionate, and quality care. I truly value your feedback! please be on the lookout for a survey regarding your visit with me today. I appreciate your input about our visit and your time in completing this!    Kiyaan Haq L. Jeanmarie Hubert, MSN, APRN, AGNP-C Adult-Gerontology Nurse Practitioner [facility] at [facility]

## 2022-08-04 NOTE — Progress Notes (Signed)
Referring Provider: Ignatius Specking, MD Primary Care Physician:  Ignatius Specking, MD Primary GI Physician: Levon Hedger   Chief Complaint  Patient presents with   NASH    Follow up on NASH. Patient states she was not able to ultrasound or bloodwork ordered at last visit due to having illness. Wants to get rescheduled.    Constipation    Follow up on constipation. Takes linzess 290 mcg about once a week. Doing better since losing weight.    Gastroesophageal Reflux    Takes omeprazole daily and doing well with reflux.    HPI:   [name] is a 72 y.o. female with past medical history of  Hep C s/p treatment, in remission, anxiety, IBS-C, RA, COPD, GERD, opioid induced constipation, HTN, hypothyroidism, SVT, recurrent diverticulitis s/p partial colectomy and NASH with advanced fibrosis.   Patient presenting today for follow up of NASH with advanced fibrosis, constipation and GERD.  Last seen November 2023, at that time GERD somewhat improved on omeprazole daily, unsure how often she is having symptoms. Taking tums maybe 1-2x/week.  Having constipation, had tried trulance without good results. Taking linzess every 1-2 days. Having 2-3 BMs on the days she takes linzess. did not tolerate the linzess daily as it gives her diarrhea.  No overt swelling to abdomen. She has had some intermittent swelling to LEs for the past month or so.     Recommend [place] of liver in December 2023, update MELD labs, AFP, CBC, repeat EGD July 2024, omeprazole 40mg  daily, linzess every 1-2 days.  Last MELD labs, CBC, AFP July 2023.  Present: Patient reports she has had issues with flu, rsv, bronchitis over the past few months. Is starting to feel some better. Did not get labs and [place] ordered previously due to ongoing sickness and trying to stay at home.  Feels that GERD is controlled sometimes and other times not as much. Notes more heartburn at night when she is laying flat, will have coughing with this as well.  She notes this occurs almost nightly. She has tried elevating her head with pillows without much improvement. No dysphagia currently. Taking omeprazole 40mg  daily.   Constipation is well managed using linzess maybe once per week. Usually has a BM once daily. No rectal bleeding or melena.   She denies any swelling to her abdomen or LEs. No episodes of confusion, itching jaundice.   Previous MELD: ( 2023) MELD 3.0 was 7   Cirrhosis related questions: Episodes of confusion/disorientation: no  Taking diuretics? No  Beta blockers? No  Prior history of variceal banding? no Prior episodes of SBP? no Last liver imaging: June 2023 as below  Alcohol use: no   MRCP 09/05/21  Diffuse hepatic steatosis with relative hypertrophy of the lateral segment of left hepatic lobe and squaring of the caudate lobe. The liver contour appears slightly irregular. Imaging findings may be seen in the setting of early cirrhosis. Fusiform dilatation of the common bile duct which measures up to 8 mm. Not significantly changed when compared with CT from 09/25/2020. No signs of choledocholithiasis or obstructing mass identified. Marked splenomegaly and increased upper abdominal varicosities compatible with stigmata of portal venous hypertension. Gastric emptying study August 2022: normal Last Colonoscopy:11/13/20- A single non-bleeding colonic angiodysplastic lesion. - Diverticulosis in the sigmoid colon. - Non-bleeding external internal hemorrhoids. - No specimens collected.  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 (Short  segment barretts) - 1 cm hiatal hernia. - Normal stomach. - Normal examined duodenum. Biopsied-normal No presence of varices   Recommendations:  Repeat EGD in July 2024 for varices Repeat colonoscopy august 2032   Past Medical History:  Diagnosis Date   Anxiety    Arthritis     COPD (chronic obstructive pulmonary disease) (HCC)    Depression    Diverticulitis 04/2012   [facility]   Essential hypertension    Frequent PVCs    Outflow tract, positive in the inferior leads   GERD (gastroesophageal reflux disease)    Hepatitis C    Treated   History of pneumonia    Insomnia    Migraine headache    NASH (nonalcoholic steatohepatitis)    Sleep apnea    Splenomegaly    Thyroid disease    Wears glasses     Past Surgical History:  Procedure Laterality Date   ABDOMINAL HYSTERECTOMY     ANKLE SURGERY Right    BIOPSY  10/05/2020   Procedure: BIOPSY;  Surgeon: Marguerita Merles, Reuel Boom, MD;  Location: [facility];  Service: Gastroenterology;;   COLON SURGERY     "part of color removed"   COLONOSCOPY     COLONOSCOPY WITH PROPOFOL N/A 11/13/2020   Castaneda:single non bleeding colonic angiodysplastic lesion, diverticulosis in sigmoid, non bleeding external hemorrhoids, no specimens   ESOPHAGEAL MANOMETRY N/A 04/10/2014   Procedure: ESOPHAGEAL MANOMETRY (EM);  Surgeon: Iva Boop, MD;  Location: [facility];  Service: Endoscopy;  Laterality: N/A;   ESOPHAGOGASTRODUODENOSCOPY     ESOPHAGOGASTRODUODENOSCOPY (EGD) WITH PROPOFOL N/A 10/05/2020   Castaneda: barrett's esophagus, short segment, 1cm hh, normal stomach, normal duodenum, biopsied, normal, no varices   INCISIONAL HERNIA REPAIR N/A 10/24/2015   Procedure: LAPAROSCOPIC REPAIR INCISIONAL HERNIA WITH MESH;  Surgeon: Violeta Gelinas, MD;  Location: [facility] OR;  Service: General;  Laterality: N/A;   INSERTION OF MESH N/A 10/24/2015   Procedure: INSERTION OF MESH;  Surgeon: Violeta Gelinas, MD;  Location: [facility] OR;  Service: General;  Laterality: N/A;   JOINT REPLACEMENT     KNEE SURGERY Right    x4   PH IMPEDANCE STUDY N/A 04/10/2014   Procedure: PH IMPEDANCE STUDY;  Surgeon: Iva Boop, MD;  Location: [facility];  Service: Endoscopy;  Laterality: N/A;   TOTAL SHOULDER ARTHROPLASTY Left 04/22/2018    Procedure: Left total shoulder arthroplasty;  Surgeon: Francena Hanly, MD;  Location: [facility];  Service: Orthopedics;  Laterality: Left;     Current Outpatient Medications  Medication Sig Dispense Refill   albuterol (PROVENTIL HFA;VENTOLIN HFA) 108 (90 Base) MCG/ACT inhaler Inhale 2 puffs into the lungs every 6 (six) hours as needed for wheezing or shortness of breath.     ALPRAZolam (XANAX) 1 MG tablet Take 1 tablet (1 mg total) by mouth 4 (four) times daily. 120 tablet 3   amitriptyline (ELAVIL) 10 MG tablet Take 1 tablet (10 mg total) by mouth at bedtime. 90 tablet 3   Ascorbic Acid (VITAMIN C) 1000 MG tablet Take 1,000 mg by mouth daily.     aspirin EC 81 MG tablet Take 81 mg by mouth daily.     cyanocobalamin 1000 MCG tablet Take 2,000 mcg by mouth daily.     ELDERBERRY PO Take 1 each by mouth daily.     FLUoxetine (PROZAC) 40 MG capsule Take 1 capsule (40 mg total) by mouth daily. 30 capsule 3   Fluticasone-Umeclidin-Vilant 100-62.5-25 MCG/INH AEPB Inhale 1 puff into the lungs daily  as needed (asthma).     gabapentin (NEURONTIN) 300 MG capsule Take 300 mg by mouth 3 (three) times daily.     hyoscyamine (LEVSIN) 0.125 MG tablet Take 1 tablet (0.125 mg total) by mouth every 8 (eight) hours as needed (abdominal pain). 30 tablet 2   linaclotide (LINZESS) 290 MCG CAPS capsule TAKE ONE CAPSULE BY MOUTH ONCE DAILY BEFORE BREAKFAST. 90 capsule 0   meloxicam (MOBIC) 15 MG tablet Take 1 tablet (15 mg total) by mouth daily. (Patient taking differently: Take 15 mg by mouth at bedtime.) 30 tablet 1   metoprolol succinate (TOPROL-XL) 100 MG 24 hr tablet TAKE ONE TABLET BY MOUTH TWICE DAILY WITH OR IMMEDIATELY FOLLOWING A MEAL. 180 tablet 1   Multiple Vitamin (MULTI-VITAMIN PO) Take 1 tablet by mouth daily.     nystatin (MYCOSTATIN) powder Apply 1 g topically daily.  2   omeprazole (PRILOSEC) 40 MG capsule Take 1 capsule (40 mg total) by mouth daily. 90 capsule 0   rosuvastatin (CRESTOR) 10 MG  tablet Take 1 tablet (10 mg total) by mouth daily. 90 tablet 1   Simethicone (GAS RELIEF PO) Take 2 capsules by mouth as needed (flatulence).      temazepam (RESTORIL) 30 MG capsule Take 1 capsule (30 mg total) by mouth at bedtime as needed. for sleep 30 capsule 3   traMADol (ULTRAM) 50 MG tablet Take 50 mg by mouth 3 (three) times daily as needed for severe pain.     No current facility-administered medications for this visit.    Allergies as of 08/04/2022 - Review Complete 08/04/2022  Allergen Reaction Noted   Levothyroxine Hives and Itching 11/25/2013   Tylenol [acetaminophen] Other (See Comments) 10/18/2015   Nabumetone Rash 10/11/2019    Family History  Problem Relation Age of Onset   Alcohol abuse Father    Dementia Father    Dementia Maternal Grandmother    Bipolar disorder Daughter    Anxiety disorder Daughter    Depression Brother    Drug abuse Brother    Alcohol abuse Paternal Uncle    Depression Paternal Uncle    ADD / ADHD Neg Hx     Social History   Socioeconomic History   Marital status: Widowed    Spouse name: Not on file   Number of children: 2   Years of education: 12th grade   Highest education level: Not on file  Occupational History   Occupation: RETIRED  Tobacco Use   Smoking status: Never    Passive exposure: Current   Smokeless tobacco: Never  Vaping Use   Vaping Use: Never used  Substance and Sexual Activity   Alcohol use: Yes    Alcohol/week: 3.0 standard drinks of alcohol    Types: 3 Cans of beer per week    Comment: occasionally   Drug use: No   Sexual activity: Never  Other Topics Concern   Not on file  Social History Narrative   Not on file   Social Determinants of Health   Financial Resource Strain: Not on file  Food Insecurity: Not on file  Transportation Needs: Not on file  Physical Activity: Not on file  Stress: Not on file  Social Connections: Not on file   Review of systems General: negative for malaise, night  sweats, fever, chills, weight loss Neck: Negative for lumps, goiter, pain and significant neck swelling Resp: Negative for cough, wheezing, dyspnea at rest CV: Negative for chest pain, leg swelling, palpitations, orthopnea GI: denies melena, hematochezia, nausea,  vomiting, diarrhea, constipation, dysphagia, odyonophagia, early satiety or unintentional weight loss. +heartburn  MSK: Negative for joint pain or swelling, back pain, and muscle pain. Derm: Negative for itching or rash Psych: Denies depression, anxiety, memory loss, confusion. No homicidal or suicidal ideation.  Heme: Negative for prolonged bleeding, bruising easily, and swollen nodes. Endocrine: Negative for cold or heat intolerance, polyuria, polydipsia and goiter. Neuro: negative for tremor, gait imbalance, syncope and seizures. The remainder of the review of systems is noncontributory.  Physical Exam: BP 119/66 (BP Location: Left Arm, Patient Position: Sitting, Cuff Size: Large)   Pulse 63   Temp 98.6 F (37 C) (Oral)   Ht 5\' 3"  (1.6 m)   Wt 195 lb 6.4 oz (88.6 kg)   BMI 34.61 kg/m  General:   Alert and oriented. No distress noted. Pleasant and cooperative.  Head:  Normocephalic and atraumatic. Eyes:  Conjuctiva clear without scleral icterus. Mouth:  Oral mucosa pink and moist. Good dentition. No lesions. Heart: Normal rate and rhythm, s1 and s2 heart sounds present.  Lungs: Clear lung sounds in all lobes. Respirations equal and unlabored. Abdomen:  +BS, soft, non-tender and non-distended. No rebound or guarding. No HSM or masses noted. Derm: No palmar erythema or jaundice Msk:  Symmetrical without gross deformities. Normal posture. Extremities:  Without edema. Neurologic:  Alert and  oriented x4 Psych:  Alert and cooperative. Normal mood and affect.  Invalid input(s): "6 MONTHS"   ASSESSMENT: [name] is a 72 y.o. female presenting today for follow up of NASH with advanced fibrosis, GERD and  Constipation.  NASH with advanced fibrosis: confirmed via liver biopsy in 2022, with plans to continue with [facility] screening and MELD labs, AFP, CBC every 6 months. [place] and labs ordered at last OV in November but not done due to patient illness. Will update labs and liver imaging today. She has had no ascites, swelling to LEs, episodes of confusion, last MELD 3.0 low at 7. Due for EV screening via EGD in July which we will get her scheduled for.  IBS with Constipation: having a BM daily, taking linzess once weekly. No rectal bleeding or melena. Will continue with current regimen  GERD: on omeprazole 40mg  daily. Notes more heartburn at night and coughing that ensues thereafter. She has tried elevating her head on pillows but has not seen much improvement. Recommend increasing PPI to BID dosing for now. Should implement good reflux precautions to include being mindful of greasy, spicy, fried, citrus foods, caffeine, carbonated drinks, and chocolate as these can increase reflux symptoms. Stay upright 2-3 hours after eating, prior to lying down and avoid eating late in the evenings.    PLAN:  -Increase omeprazole 40mg  to BID -  good Reflux precautions  -MELD labs, AFP, CBC  - RUQ [place]  - Schedule EGD for EVs surveillance-ASA III - Continue with linzess PRN - Reduce salt intake to <2 g per day - Can take Tylenol max of 2 g per day (650 mg q8h) for pain - Avoid NSAIDs for pain - Avoid eating raw oysters/shellfish - Ensure every night before going to sleep  All questions were answered, patient verbalized understanding and is in agreement with plan as outlined above.   Follow Up: 6 months   Larisha Vencill L. Jeanmarie Hubert, MSN, APRN, AGNP-C Adult-Gerontology Nurse Practitioner [facility]  I have reviewed the note and agree with the APP's assessment as described in this progress note  Katrinka Blazing, MD Gastroenterology and Hepatology  [facility]  Gastroenterology

## 2022-08-05 ENCOUNTER — Telehealth (INDEPENDENT_AMBULATORY_CARE_PROVIDER_SITE_OTHER): Payer: Self-pay | Admitting: Gastroenterology

## 2022-08-05 NOTE — Telephone Encounter (Signed)
Pt left voicemail returning call to schedule EGD.  Returned call to patient. Pt has scheduled EGD for 09/05/22 at 2 pm. Instructions mailed and sent via my chart.  Pt notified of ultrasound appt also.   Notification or Prior Authorization is not required for the requested services You are not required to submit a notification/prior authorization based on the information provided. If you have general questions about the prior authorization requirements, visit [email or web address] > Clinician Resources > Advance and Admission Notification Requirements. The number above acknowledges your notification. Please write this reference number down for future reference. If you would like to request an organization determination, please call us at [phone]. Decision ID #: [id number]

## 2022-08-05 NOTE — Telephone Encounter (Signed)
Pt in yesterday and needing EGD and RUQ [place] scheduled. Pt ride was waiting downstairs so pt had to leave before being scheduled.  Contacted Central Scheduling this morning and [place] is scheduled for 08/25/22 at 8:30 am-[place];nothing to eat/drink after midnight Pt needing EGD ASA 3 scheduled for July. Left message to return call

## 2022-08-06 ENCOUNTER — Encounter (INDEPENDENT_AMBULATORY_CARE_PROVIDER_SITE_OTHER): Payer: Self-pay

## 2022-08-12 ENCOUNTER — Telehealth (INDEPENDENT_AMBULATORY_CARE_PROVIDER_SITE_OTHER): Payer: Self-pay | Admitting: Gastroenterology

## 2022-08-12 NOTE — Telephone Encounter (Signed)
Pt left voicemail in regards to arrival time for procedure on 09/05/22.  Pt has pre op on 09/02/22 at 10:30am.  Left detailed message on cell

## 2022-08-25 ENCOUNTER — Ambulatory Visit (HOSPITAL_COMMUNITY)
Admission: RE | Admit: 2022-08-25 | Discharge: 2022-08-25 | Disposition: A | Discharge status: Home / Self Care | Payer: 59 | Source: Ambulatory Visit | Attending: Gastroenterology | Admitting: Gastroenterology

## 2022-08-25 DIAGNOSIS — K7400 Hepatic fibrosis, unspecified: Secondary | ICD-10-CM | POA: Diagnosis not present

## 2022-08-25 DIAGNOSIS — K7581 Nonalcoholic steatohepatitis (NASH): Secondary | ICD-10-CM | POA: Insufficient documentation

## 2022-08-25 DIAGNOSIS — K760 Fatty (change of) liver, not elsewhere classified: Secondary | ICD-10-CM | POA: Diagnosis not present

## 2022-08-27 ENCOUNTER — Ambulatory Visit: Payer: 59 | Attending: Cardiology | Admitting: Cardiology

## 2022-08-27 ENCOUNTER — Encounter: Payer: Self-pay | Admitting: Cardiology

## 2022-08-27 VITALS — BP 130/73 | HR 62 | Ht 63.0 in | Wt 196.8 lb

## 2022-08-27 DIAGNOSIS — I10 Essential (primary) hypertension: Secondary | ICD-10-CM

## 2022-08-27 DIAGNOSIS — I493 Ventricular premature depolarization: Secondary | ICD-10-CM | POA: Diagnosis not present

## 2022-08-27 NOTE — Patient Instructions (Addendum)

## 2022-08-27 NOTE — Progress Notes (Signed)
Cardiology Office Note  Date: 08/27/2022   ID: [name], DOB 1950-05-12, MRN [id number]  History of Present Illness: [name] is a 72 y.o. female last seen in November 2023.  She is here for a routine visit.  States that she had upper respiratory problems over the winter months and spring.  States that she had influenza followed by RSV.  Also more recently some allergy problems.  She does have fluctuating palpitations, but no definite progression, no associated syncope.  Chart indicates EGD scheduled in the near future with Dr. Levon Hedger.  She should be able to proceed from a cardiac perspective and can hold aspirin if necessary.  Would continue beta-blocker throughout with history of frequent PVCs.  ECG today shows sinus rhythm with ventricular bigeminy and poor R wave progression.  I reviewed the remainder of her medications.  Physical Exam: VS:  BP 130/73   Pulse 62   Ht 5\' 3"  (1.6 m)   Wt 196 lb 12.8 oz (89.3 kg)   SpO2 98%   BMI 34.86 kg/m , BMI Body mass index is 34.86 kg/m.  Wt Readings from Last 3 Encounters:  08/27/22 196 lb 12.8 oz (89.3 kg)  08/04/22 195 lb 6.4 oz (88.6 kg)  02/10/22 211 lb 12.8 oz (96.1 kg)    General: Patient appears comfortable at rest. HEENT: Conjunctiva and lids normal. Neck: Supple, no elevated JVP or carotid bruits. Lungs: Decreased breath sounds, nonlabored breathing at rest. Cardiac: Regular rate and rhythm, no S3 or significant systolic murmur. Extremities: No pitting edema.  ECG:  An ECG dated 07/31/2021 was personally reviewed today and demonstrated:  Sinus rhythm with ventricular bigeminy.  Labwork: June 2023: BUN 18, creatinine 0.77, potassium 4.6, AST 58, ALT 42, cholesterol 200, triglycerides 248, HDL 37, LDL 119, TSH 2.41 06/23/8117: Hemoglobin 13.7; Platelets 63   Other Studies Reviewed Today:  Cardiac monitor June 2023: ZIO XT reviewed 1.  Predominant rhythm is sinus with heart rate ranging from 65 bpm up to 98  bpm and average heart rate 74 bpm. 2.  There were rare PACs including atrial couplets and triplets representing less than 1% total beats. 3.  There were frequent PVCs representing approximately 20% total beats.  Otherwise rare ventricular couplets and triplets representing less than 1% total beats.  Also episodes of ventricular bigeminy and trigeminy. 4.  No sustained arrhythmias or pauses.   Echocardiogram 08/19/2021:  1. Left ventricular ejection fraction, by estimation, is 60 to 65%. The  left ventricle has normal function. The left ventricle has no regional  wall motion abnormalities. Left ventricular diastolic parameters are  consistent with Grade I diastolic  dysfunction (impaired relaxation). Elevated left atrial pressure. The  average left ventricular global longitudinal strain is -18.8 %. The global  longitudinal strain is normal.   2. Right ventricular systolic function is normal. The right ventricular  size is normal. Tricuspid regurgitation signal is inadequate for assessing  PA pressure.   3. The mitral valve is abnormal. Mild mitral valve regurgitation. No  evidence of mitral stenosis.   4. The aortic valve is tricuspid. There is mild calcification of the  aortic valve. There is mild thickening of the aortic valve. Aortic valve  regurgitation is not visualized. No aortic stenosis is present.   5. The inferior vena cava is normal in size with greater than 50%  respiratory variability, suggesting right atrial pressure of 3 mmHg.  Assessment and Plan:  1.  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  LVEF 60 to 65% by echocardiogram in June 2023, RV contraction also normal.  ECG reviewed.  She reports no progressive symptomatology, no syncope.  No changes were made today.  2.  Essential hypertension.  Blood pressure control is reasonable today.  No change to current regimen.  3.  Pending EGD.  She should be able to proceed from a cardiac  perspective, may hold aspirin if necessary, but would continue Toprol-XL.  Disposition:  Follow up  6 months.  Signed, Jonelle Sidle, M.D., F.A.C.C. [facility] at [facility]

## 2022-08-29 DIAGNOSIS — I471 Supraventricular tachycardia, unspecified: Secondary | ICD-10-CM | POA: Diagnosis not present

## 2022-08-29 DIAGNOSIS — Z7189 Other specified counseling: Secondary | ICD-10-CM | POA: Diagnosis not present

## 2022-08-29 DIAGNOSIS — M069 Rheumatoid arthritis, unspecified: Secondary | ICD-10-CM | POA: Diagnosis not present

## 2022-08-29 DIAGNOSIS — Z299 Encounter for prophylactic measures, unspecified: Secondary | ICD-10-CM | POA: Diagnosis not present

## 2022-08-29 DIAGNOSIS — I10 Essential (primary) hypertension: Secondary | ICD-10-CM | POA: Diagnosis not present

## 2022-08-29 DIAGNOSIS — Z0000 Encounter for general adult medical examination without abnormal findings: Secondary | ICD-10-CM | POA: Diagnosis not present

## 2022-09-01 NOTE — Patient Instructions (Signed)
[name]  09/01/2022     @PREFPERIOPPHARMACY @   Your procedure is scheduled on  09/05/2022.   Report to [facility] at  1200  P.M.   Call this number if you have problems the morning of surgery:  [phone]  If you experience any cold or flu symptoms such as cough, fever, chills, shortness of breath, etc. between now and your scheduled surgery, please notify us at the above number.   Remember:  Follow the diet instructions given to you by the office.     Take these medicines the morning of surgery with A SIP OF WATER          xanax(if needed), fluoxetine, gabapentin, meloxicam, reglan, metoprolol, omeprazole, zofran (if needed), pyridium, tramadol (if needed).     Do not wear jewelry, make-up or nail polish, including gel polish,  artificial nails, or any other type of covering on natural nails (fingers and  toes).  Do not wear lotions, powders, or perfumes, or deodorant.  Do not shave 48 hours prior to surgery.  Men may shave face and neck.  Do not bring valuables to the hospital.  [facility] is not responsible for any belongings or valuables.  Contacts, dentures or bridgework may not be worn into surgery.  Leave your suitcase in the car.  After surgery it may be brought to your room.  For patients admitted to the hospital, discharge time will be determined by your treatment team.  Patients discharged the day of surgery will not be allowed to drive home and must have someone with them for 24 hours.    Special instructions:   DO NOT smoke tobacco or vape for 24 hours before your procedure.  Please read over the following fact sheets that you were given. Anesthesia Post-op Instructions and Care and Recovery After Surgery      Upper Endoscopy, Adult, Care After After the procedure, it is common to have a sore throat. It is also common to have: Mild stomach pain or discomfort. Bloating. Nausea. Follow these instructions at home: The instructions below  may help you care for yourself at home. Your health care provider may give you more instructions. If you have questions, ask your health care provider. If you were given a sedative during the procedure, it can affect you for several hours. Do not drive or operate machinery until your health care provider says that it is safe. If you will be going home right after the procedure, plan to have a responsible adult: Take you home from the hospital or clinic. You will not be allowed to drive. Care for you for the time you are told. Follow instructions from your health care provider about what you may eat and drink. Return to your normal activities as told by your health care provider. Ask your health care provider what activities are safe for you. Take over-the-counter and prescription medicines only as told by your health care provider. Contact a health care provider if you: Have a sore throat that lasts longer than one day. Have trouble swallowing. Have a fever. Get help right away if you: Vomit blood or your vomit looks like coffee grounds. Have bloody, black, or tarry stools. Have a very bad sore throat or you cannot swallow. Have difficulty breathing or very bad pain in your chest or abdomen. These symptoms may be an emergency. Get help right away. Call 911. Do not wait to see if the symptoms will go away. Do not  drive yourself to the hospital. Summary After the procedure, it is common to have a sore throat, mild stomach discomfort, bloating, and nausea. If you were given a sedative during the procedure, it can affect you for several hours. Do not drive until your health care provider says that it is safe. Follow instructions from your health care provider about what you may eat and drink. Return to your normal activities as told by your health care provider. This information is not intended to replace advice given to you by your health care provider. Make sure you discuss any questions you  have with your health care provider. Document Revised: 06/12/2021 Document Reviewed: 06/12/2021 Elsevier Patient Education  2024 [employer] Monitored Anesthesia Care, Care After The following information offers guidance on how to care for yourself after your procedure. Your health care provider may also give you more specific instructions. If you have problems or questions, contact your health care provider. What can I expect after the procedure? After the procedure, it is common to have: Tiredness. Little or no memory about what happened during or after the procedure. Impaired judgment when it comes to making decisions. Nausea or vomiting. Some trouble with balance. Follow these instructions at home: For the time period you were told by your health care provider:  Rest. Do not participate in activities where you could fall or become injured. Do not drive or use machinery. Do not drink alcohol. Do not take sleeping pills or medicines that cause drowsiness. Do not make important decisions or sign legal documents. Do not take care of children on your own. Medicines Take over-the-counter and prescription medicines only as told by your health care provider. If you were prescribed antibiotics, take them as told by your health care provider. Do not stop using the antibiotic even if you start to feel better. Eating and drinking Follow instructions from your health care provider about what you may eat and drink. Drink enough fluid to keep your urine pale yellow. If you vomit: Drink clear fluids slowly and in small amounts as you are able. Clear fluids include water, ice chips, low-calorie sports drinks, and fruit juice that has water added to it (diluted fruit juice). Eat light and bland foods in small amounts as you are able. These foods include bananas, applesauce, rice, lean meats, toast, and crackers. General instructions  Have a responsible adult stay with you for the time you are  told. It is important to have someone help care for you until you are awake and alert. If you have sleep apnea, surgery and some medicines can increase your risk for breathing problems. Follow instructions from your health care provider about wearing your sleep device: When you are sleeping. This includes during daytime naps. While taking prescription pain medicines, sleeping medicines, or medicines that make you drowsy. Do not use any products that contain nicotine or tobacco. These products include cigarettes, chewing tobacco, and vaping devices, such as e-cigarettes. If you need help quitting, ask your health care provider. Contact a health care provider if: You feel nauseous or vomit every time you eat or drink. You feel light-headed. You are still sleepy or having trouble with balance after 24 hours. You get a rash. You have a fever. You have redness or swelling around the IV site. Get help right away if: You have trouble breathing. You have new confusion after you get home. These symptoms may be an emergency. Get help right away. Call 911. Do not wait to see if the  symptoms will go away. Do not drive yourself to the hospital. This information is not intended to replace advice given to you by your health care provider. Make sure you discuss any questions you have with your health care provider. Document Revised: 07/29/2021 Document Reviewed: 07/29/2021 Elsevier Patient Education  2024 [employer].

## 2022-09-02 ENCOUNTER — Encounter (HOSPITAL_COMMUNITY): Payer: Self-pay

## 2022-09-02 ENCOUNTER — Encounter (HOSPITAL_COMMUNITY)
Admission: RE | Admit: 2022-09-02 | Discharge: 2022-09-02 | Disposition: A | Discharge status: Home / Self Care | Payer: 59 | Source: Ambulatory Visit | Attending: Gastroenterology | Admitting: Gastroenterology

## 2022-09-02 VITALS — BP 130/73 | HR 62 | Temp 98.3°F | Resp 18 | Ht 63.0 in | Wt 196.8 lb

## 2022-09-02 DIAGNOSIS — K7581 Nonalcoholic steatohepatitis (NASH): Secondary | ICD-10-CM | POA: Insufficient documentation

## 2022-09-02 DIAGNOSIS — Z01812 Encounter for preprocedural laboratory examination: Secondary | ICD-10-CM | POA: Insufficient documentation

## 2022-09-02 HISTORY — DX: Bronchitis, not specified as acute or chronic: J40

## 2022-09-02 LAB — COMPREHENSIVE METABOLIC PANEL
ALT: 20 U/L (ref 0–44)
AST: 28 U/L (ref 15–41)
Albumin: 3.5 g/dL (ref 3.5–5.0)
Alkaline Phosphatase: 109 U/L (ref 38–126)
Anion gap: 7 (ref 5–15)
BUN: 18 mg/dL (ref 8–23)
CO2: 24 mmol/L (ref 22–32)
Calcium: 9.1 mg/dL (ref 8.9–10.3)
Chloride: 105 mmol/L (ref 98–111)
Creatinine, Ser: 0.62 mg/dL (ref 0.44–1.00)
GFR, Estimated: >60 mL/min (ref >60)
Glucose, Bld: 156 mg/dL — ABNORMAL HIGH (ref 70–99)
Potassium: 3.9 mmol/L (ref 3.5–5.1)
Sodium: 136 mmol/L (ref 135–145)
Total Bilirubin: 0.9 mg/dL (ref 0.3–1.2)
Total Protein: 7.1 g/dL (ref 6.5–8.1)

## 2022-09-02 LAB — CBC WITH DIFFERENTIAL/PLATELET
Abs Immature Granulocytes: 0.01 10*3/uL (ref 0.00–0.07)
Basophils Absolute: 0 10*3/uL (ref 0.0–0.1)
Basophils Relative: 1 %
Eosinophils Absolute: 0.1 10*3/uL (ref 0.0–0.5)
Eosinophils Relative: 2 %
HCT: 35.9 % — ABNORMAL LOW (ref 36.0–46.0)
Hemoglobin: 11.9 g/dL — ABNORMAL LOW (ref 12.0–15.0)
Immature Granulocytes: 0 %
Lymphocytes Relative: 21 %
Lymphs Abs: 0.6 10*3/uL — ABNORMAL LOW (ref 0.7–4.0)
MCH: 32.5 pg (ref 26.0–34.0)
MCHC: 33.1 g/dL (ref 30.0–36.0)
MCV: 98.1 fL (ref 80.0–100.0)
Monocytes Absolute: 0.2 10*3/uL (ref 0.1–1.0)
Monocytes Relative: 9 %
Neutro Abs: 1.8 10*3/uL (ref 1.7–7.7)
Neutrophils Relative %: 67 %
Platelets: 56 10*3/uL — ABNORMAL LOW (ref 150–400)
RBC: 3.66 MIL/uL — ABNORMAL LOW (ref 3.87–5.11)
RDW: 15.7 % — ABNORMAL HIGH (ref 11.5–15.5)
WBC: 2.6 10*3/uL — ABNORMAL LOW (ref 4.0–10.5)
nRBC: 0 % (ref 0.0–0.2)

## 2022-09-02 LAB — PROTIME-INR
INR: 1.1 (ref 0.8–1.2)
Prothrombin Time: 14.4 seconds (ref 11.4–15.2)

## 2022-09-05 ENCOUNTER — Encounter (HOSPITAL_COMMUNITY)
Admission: RE | Disposition: A | Discharge status: Home / Self Care | Payer: Self-pay | Source: Home / Self Care | Attending: Gastroenterology

## 2022-09-05 ENCOUNTER — Ambulatory Visit (HOSPITAL_COMMUNITY)
Admission: RE | Admit: 2022-09-05 | Discharge: 2022-09-05 | Disposition: A | Discharge status: Home / Self Care | Payer: 59 | Attending: Gastroenterology | Admitting: Gastroenterology

## 2022-09-05 ENCOUNTER — Ambulatory Visit (HOSPITAL_BASED_OUTPATIENT_CLINIC_OR_DEPARTMENT_OTHER): Payer: 59 | Admitting: Certified Registered Nurse Anesthetist

## 2022-09-05 ENCOUNTER — Ambulatory Visit (HOSPITAL_COMMUNITY): Payer: 59 | Admitting: Certified Registered Nurse Anesthetist

## 2022-09-05 DIAGNOSIS — T182XXA Foreign body in stomach, initial encounter: Secondary | ICD-10-CM | POA: Diagnosis not present

## 2022-09-05 DIAGNOSIS — K3189 Other diseases of stomach and duodenum: Secondary | ICD-10-CM

## 2022-09-05 DIAGNOSIS — W44F3XA Food entering into or through a natural orifice, initial encounter: Secondary | ICD-10-CM | POA: Insufficient documentation

## 2022-09-05 DIAGNOSIS — I10 Essential (primary) hypertension: Secondary | ICD-10-CM

## 2022-09-05 DIAGNOSIS — I8510 Secondary esophageal varices without bleeding: Secondary | ICD-10-CM

## 2022-09-05 DIAGNOSIS — K766 Portal hypertension: Secondary | ICD-10-CM | POA: Diagnosis not present

## 2022-09-05 DIAGNOSIS — Z13810 Encounter for screening for upper gastrointestinal disorder: Secondary | ICD-10-CM

## 2022-09-05 DIAGNOSIS — K7460 Unspecified cirrhosis of liver: Secondary | ICD-10-CM

## 2022-09-05 DIAGNOSIS — J449 Chronic obstructive pulmonary disease, unspecified: Secondary | ICD-10-CM

## 2022-09-05 DIAGNOSIS — K7581 Nonalcoholic steatohepatitis (NASH): Secondary | ICD-10-CM | POA: Diagnosis not present

## 2022-09-05 HISTORY — PX: ESOPHAGOGASTRODUODENOSCOPY (EGD) WITH PROPOFOL: SHX5813

## 2022-09-05 SURGERY — ESOPHAGOGASTRODUODENOSCOPY (EGD) WITH PROPOFOL
Anesthesia: General

## 2022-09-05 MED ORDER — PROPOFOL 10 MG/ML IV BOLUS
INTRAVENOUS | Status: DC | PRN
  Administered 2022-09-05: 70 mg via INTRAVENOUS

## 2022-09-05 MED ORDER — PHENYLEPHRINE HCL (PRESSORS) 10 MG/ML IV SOLN
INTRAVENOUS | Status: DC | PRN
  Administered 2022-09-05: 80 ug via INTRAVENOUS

## 2022-09-05 MED ORDER — PROPOFOL 500 MG/50ML IV EMUL
INTRAVENOUS | Status: AC
  Filled 2022-09-05: qty 50

## 2022-09-05 MED ORDER — LACTATED RINGERS IV SOLN
INTRAVENOUS | Status: DC
  Administered 2022-09-05: 1000 mL via INTRAVENOUS

## 2022-09-05 MED ORDER — PROPOFOL 500 MG/50ML IV EMUL
INTRAVENOUS | Status: DC | PRN
  Administered 2022-09-05: 150 ug/kg/min via INTRAVENOUS

## 2022-09-05 MED ORDER — LIDOCAINE HCL (CARDIAC) PF 100 MG/5ML IV SOSY
PREFILLED_SYRINGE | INTRAVENOUS | Status: DC | PRN
  Administered 2022-09-05: 50 mg via INTRAVENOUS

## 2022-09-05 MED ORDER — PHENYLEPHRINE 80 MCG/ML (10ML) SYRINGE FOR IV PUSH (FOR BLOOD PRESSURE SUPPORT)
PREFILLED_SYRINGE | INTRAVENOUS | Status: AC
  Filled 2022-09-05: qty 10

## 2022-09-05 NOTE — Discharge Instructions (Signed)
You are being discharged to home.  Resume your previous diet.  Will discuss with Dr. Diona Browner possibility of switching to carvedilol. Repeat upper endoscopy for surveillance will depend on discussion of beta blocker switch with Dr. Diona Browner.

## 2022-09-05 NOTE — Transfer of Care (Signed)
Immediate Anesthesia Transfer of Care Note  Patient: [name]  Procedure(s) Performed: ESOPHAGOGASTRODUODENOSCOPY (EGD) WITH PROPOFOL  Patient Location: Short Stay  Anesthesia Type:General  Level of Consciousness: awake, alert , and oriented  Airway & Oxygen Therapy: Patient Spontanous Breathing  Post-op Assessment: Report given to RN, Post -op Vital signs reviewed and stable, Patient moving all extremities X 4, and Patient able to stick tongue midline  Post vital signs: Reviewed  Last Vitals:  Vitals Value Taken Time  BP 91/43   Temp 97.9   Pulse 69   Resp 21   SpO2 97     Last Pain:  Vitals:   09/05/22 1320  TempSrc:   PainSc: 0-No pain      Patients Stated Pain Goal: 7 (09/05/22 1221)  Complications: No notable events documented.

## 2022-09-05 NOTE — Anesthesia Preprocedure Evaluation (Addendum)
Anesthesia Evaluation  Patient identified by MRN, date of birth, ID band Patient awake    Reviewed: Allergy & Precautions, H&P , NPO status , Patient's Chart, lab work & pertinent test results  Airway Mallampati: II  TM Distance: >3 FB Neck ROM: Full    Dental  (+) Dental Advisory Given, Edentulous Upper, Missing, Loose,    Pulmonary shortness of breath, sleep apnea , COPD          Cardiovascular hypertension, Pt. on medications + dysrhythmias (frequent PVCs)  Rhythm:Regular Rate:Normal     Neuro/Psych  Headaches PSYCHIATRIC DISORDERS Anxiety Depression       GI/Hepatic ,GERD  Medicated and Controlled,,(+) Cirrhosis       , Hepatitis -, C  Endo/Other  negative endocrine ROS    Renal/GU Renal InsufficiencyRenal disease  negative genitourinary   Musculoskeletal  (+) Arthritis ,    Abdominal   Peds negative pediatric ROS (+)  Hematology  (+) Blood dyscrasia (thrombocytopenia), anemia   Anesthesia Other Findings   Reproductive/Obstetrics negative OB ROS                             Anesthesia Physical Anesthesia Plan  ASA: 3  Anesthesia Plan: General   Post-op Pain Management: Minimal or no pain anticipated   Induction: Intravenous  PONV Risk Score and Plan: 1 and Propofol infusion  Airway Management Planned: Natural Airway and Nasal Cannula  Additional Equipment:   Intra-op Plan:   Post-operative Plan:   Informed Consent: I have reviewed the patients History and Physical, chart, labs and discussed the procedure including the risks, benefits and alternatives for the proposed anesthesia with the patient or authorized representative who has indicated his/her understanding and acceptance.     Dental advisory given  Plan Discussed with: CRNA and Surgeon  Anesthesia Plan Comments:        Anesthesia Quick Evaluation

## 2022-09-05 NOTE — Op Note (Signed)
[facility] Patient Name: [name] Procedure Date: 09/05/2022 1:12 PM MRN: [id number] Date of Birth: 1950-08-31 Attending MD: Katrinka Blazing , , [id number] CSN: [id number] Age: 72 Admit Type: Outpatient Procedure:                Upper GI endoscopy Indications:              Portal hypertension rule out esophageal varices Providers:                Katrinka Blazing, Angelica Ran, Lennice Sites                            Technician, Technician Referring MD:              Medicines:                Monitored Anesthesia Care Complications:            No immediate complications. Estimated Blood Loss:     Estimated blood loss: none. Procedure:                Pre-Anesthesia Assessment:                           - Prior to the procedure, a History and Physical                            was performed, and patient medications, allergies                            and sensitivities were reviewed. The patient's                            tolerance of previous anesthesia was reviewed.                           - The risks and benefits of the procedure and the                            sedation options and risks were discussed with the                            patient. All questions were answered and informed                            consent was obtained.                           - ASA Grade Assessment: III - A patient with severe                            systemic disease.                           After obtaining informed consent, the endoscope was                            passed under direct vision. Throughout the  procedure, the patient's blood pressure, pulse, and                            oxygen saturations were monitored continuously. The                            [id number]-H190 ([id number]) scope was introduced through the                            mouth, and advanced to the second part of duodenum.                            The upper GI endoscopy was  performed with                            difficulty due to presence of food. The patient                            tolerated the procedure well. Scope In: 1:28:50 PM Scope Out: 1:31:26 PM Total Procedure Duration: 0 hours 2 minutes 36 seconds  Findings:      Grade II varices were found in the lower third of the esophagus.      A large amount of food (residue) was found in the gastric antrum.      The examined duodenum was normal. Impression:               - Grade II esophageal varices.                           - A large amount of food (residue) in the stomach.                           - Normal examined duodenum.                           - No specimens collected. Moderate Sedation:      Per Anesthesia Care Recommendation:           - Discharge patient to home (ambulatory).                           - Resume previous diet.                           - Will discuss with Dr. Diona Browner possibility of                            switching to carvedilol.                           - Repeat upper endoscopy for surveillance will                            depend on discussion of beta blocker switch with  Dr. Diona Browner. Procedure Code(s):        --- Professional ---                           [phone], Esophagogastroduodenoscopy, flexible,                            transoral; diagnostic, including collection of                            specimen(s) by brushing or washing, when performed                            (separate procedure) Diagnosis Code(s):        --- Professional ---                           K76.6, Portal hypertension                           I85.10, Secondary esophageal varices without                            bleeding CPT copyright 2022 American Medical Association. All rights reserved. The codes documented in this report are preliminary and upon coder review may  be revised to meet current compliance requirements. Katrinka Blazing, MD Katrinka Blazing,  09/05/2022 1:40:21 PM This report has been signed electronically. Number of Addenda: 0

## 2022-09-05 NOTE — H&P (Signed)
JENE HUQ is an 72 y.o. female.   Chief Complaint: NASH advanced fibrosis, rule out varices HPI: CHAELYN BUNYAN is a 72 y.o. female with past medical history of  Hep C s/p treatment, in remission, anxiety, IBS-C, RA, COPD, GERD, opioid induced constipation, HTN, hypothyroidism, SVT, recurrent diverticulitis s/p partial colectomy and NASH with advanced fibrosis, coming for EGD for variceal screening.  The patient denies having any nausea, vomiting, fever, chills, hematochezia, melena, hematemesis, abdominal distention, abdominal pain, diarrhea, jaundice, pruritus or weight loss.  Notably, she has had chronic thrombocytopenia for multiple years, at least since 2008.  However this has recently worsened.   Past Medical History:  Diagnosis Date   Anxiety    Arthritis    Bronchitis    COPD (chronic obstructive pulmonary disease) (HCC)    Depression    Diverticulitis 04/2012   [facility]   Essential hypertension    Frequent PVCs    Outflow tract, positive in the inferior leads   GERD (gastroesophageal reflux disease)    Hepatitis C    Treated   History of pneumonia    Insomnia    Migraine headache    NASH (nonalcoholic steatohepatitis)    Sleep apnea    Splenomegaly    Thyroid disease    Wears glasses     Past Surgical History:  Procedure Laterality Date   ABDOMINAL HYSTERECTOMY     ANKLE SURGERY Right    BIOPSY  10/05/2020   Procedure: BIOPSY;  Surgeon: Dolores Frame, MD;  Location: [facility];  Service: Gastroenterology;;   COLON SURGERY     "part of color removed"   COLONOSCOPY     COLONOSCOPY WITH PROPOFOL N/A 11/13/2020   Castaneda:single non bleeding colonic angiodysplastic lesion, diverticulosis in sigmoid, non bleeding external hemorrhoids, no specimens   ESOPHAGEAL MANOMETRY N/A 04/10/2014   Procedure: ESOPHAGEAL MANOMETRY (EM);  Surgeon: Iva Boop, MD;  Location: [facility] END[facility]COPY;  Service: Endoscopy;  Laterality: N/A;    ESOPHAGOGASTRODUODENOSCOPY     ESOPHAGOGASTRODUODENOSCOPY (EGD) WITH PROPOFOL N/A 10/05/2020   Castaneda: barrett's esophagus, short segment, 1cm hh, normal stomach, normal duodenum, biopsied, normal, no varices   INCISIONAL HERNIA REPAIR N/A 10/24/2015   Procedure: LAPAROSCOPIC REPAIR INCISIONAL HERNIA WITH MESH;  Surgeon: Violeta Gelinas, MD;  Location: [facility] OR;  Service: General;  Laterality: N/A;   INSERTION OF MESH N/A 10/24/2015   Procedure: INSERTION OF MESH;  Surgeon: Violeta Gelinas, MD;  Location: [facility] OR;  Service: General;  Laterality: N/A;   JOINT REPLACEMENT     KNEE SURGERY Right    x4   PH IMPEDANCE STUDY N/A 04/10/2014   Procedure: PH IMPEDANCE STUDY;  Surgeon: Iva Boop, MD;  Location: [facility];  Service: Endoscopy;  Laterality: N/A;   TOTAL SHOULDER ARTHROPLASTY Left 04/22/2018   Procedure: Left total shoulder arthroplasty;  Surgeon: Francena Hanly, MD;  Location: [facility] ORS;  Service: Orthopedics;  Laterality: Left;     Family History  Problem Relation Age of Onset   Alcohol abuse Father    Dementia Father    Dementia Maternal Grandmother    Bipolar disorder Daughter    Anxiety disorder Daughter    Depression Brother    Drug abuse Brother    Alcohol abuse Paternal Uncle    Depression Paternal Uncle    ADD / ADHD Neg Hx    Social History:  reports that she has never smoked. She has been exposed to tobacco smoke. She has never used smokeless tobacco.  She reports current alcohol use of about 3.0 standard drinks of alcohol per week. She reports that she does not use drugs.  Allergies:  Allergies  Allergen Reactions   Acetaminophen Other (See Comments)    Liver function per MD  Other Reaction(s): Not available   Levothyroxine Hives and Itching   Nabumetone Rash    Medications Prior to Admission  Medication Sig Dispense Refill   albuterol (PROVENTIL HFA;VENTOLIN HFA) 108 (90 Base) MCG/ACT inhaler Inhale 2 puffs into the lungs every 6 (six) hours as  needed for wheezing or shortness of breath.     ALPRAZolam (XANAX) 1 MG tablet Take 1 tablet (1 mg total) by mouth 4 (four) times daily. 120 tablet 3   amitriptyline (ELAVIL) 10 MG tablet Take 1 tablet (10 mg total) by mouth at bedtime. 90 tablet 3   FLUoxetine (PROZAC) 40 MG capsule Take 1 capsule (40 mg total) by mouth daily. 30 capsule 3   gabapentin (NEURONTIN) 300 MG capsule Take 300 mg by mouth 3 (three) times daily.     hyoscyamine (LEVSIN) 0.125 MG tablet Take 1 tablet (0.125 mg total) by mouth every 8 (eight) hours as needed (abdominal pain). 30 tablet 2   linaclotide (LINZESS) 290 MCG CAPS capsule TAKE ONE CAPSULE BY MOUTH ONCE DAILY BEFORE BREAKFAST. 90 capsule 0   loratadine (CLARITIN) 10 MG tablet Take 1 tablet by mouth daily.     meloxicam (MOBIC) 15 MG tablet Take 1 tablet (15 mg total) by mouth daily. 30 tablet 1   metoprolol succinate (TOPROL-XL) 100 MG 24 hr tablet TAKE ONE TABLET BY MOUTH TWICE DAILY WITH OR IMMEDIATELY FOLLOWING A MEAL. 180 tablet 1   omeprazole (PRILOSEC) 40 MG capsule Take 1 capsule (40 mg total) by mouth daily. 90 capsule 0   traMADol (ULTRAM) 50 MG tablet Take 50 mg by mouth 3 (three) times daily as needed for severe pain.     Ascorbic Acid (VITAMIN C) 1000 MG tablet Take 1,000 mg by mouth daily.     aspirin EC 81 MG tablet Take 81 mg by mouth daily.     cyanocobalamin 1000 MCG tablet Take 2,000 mcg by mouth daily.     ELDERBERRY PO Take 1 each by mouth daily.     Fluticasone-Umeclidin-Vilant 100-62.5-25 MCG/INH AEPB Inhale 1 puff into the lungs daily as needed (asthma).     metoCLOPramide (REGLAN) 10 MG tablet Take 10 mg by mouth every 6 (six) hours as needed for nausea or vomiting.     Multiple Vitamin (MULTI-VITAMIN PO) Take 1 tablet by mouth daily.     nystatin (MYCOSTATIN) powder Apply 1 g topically daily.  2   ondansetron (ZOFRAN) 4 MG tablet Take 1 tablet by mouth every 8 (eight) hours as needed for nausea or vomiting.     phenazopyridine  (PYRIDIUM) 200 MG tablet Take 1 tablet by mouth every 8 (eight) hours.     polyethylene glycol (MIRALAX) 17 g packet Take 17 g by mouth daily as needed for mild constipation or moderate constipation.     potassium chloride (KLOR-CON) 10 MEQ tablet Take 10 mEq by mouth as needed (LEG CRAMPS).     predniSONE (DELTASONE) 10 MG tablet Take 10 mg by mouth daily with breakfast.     rosuvastatin (CRESTOR) 10 MG tablet Take 1 tablet (10 mg total) by mouth daily. 90 tablet 1   Simethicone (GAS RELIEF PO) Take 2 capsules by mouth as needed (flatulence).      temazepam (RESTORIL) 30 MG capsule  Take 1 capsule (30 mg total) by mouth at bedtime as needed. for sleep 30 capsule 3    No results found for this or any previous visit (from the past 48 hour(s)). No results found.  Review of Systems  Temperature 97.9 F (36.6 C), temperature source Oral, resp. rate 12, SpO2 99 %. Physical Exam  GENERAL: The patient is AO x3, in no acute distress. HEENT: Head is normocephalic and atraumatic. EOMI are intact. Mouth is well hydrated and without lesions. NECK: Supple. No masses LUNGS: Clear to auscultation. No presence of rhonchi/wheezing/rales. Adequate chest expansion HEART: RRR, normal s1 and s2. ABDOMEN: Soft, nontender, no guarding, no peritoneal signs, and nondistended. BS +. No masses. EXTREMITIES: Without any cyanosis, clubbing, rash, lesions or edema. NEUROLOGIC: AOx3, no focal motor deficit. SKIN: no jaundice, no rashes  Assessment/Plan ASPIN PALOMAREZ is a 72 y.o. female with past medical history of  Hep C s/p treatment, in remission, anxiety, IBS-C, RA, COPD, GERD, opioid induced constipation, HTN, hypothyroidism, SVT, recurrent diverticulitis s/p partial colectomy and NASH with advanced fibrosis, coming for EGD for variceal screening.  Will proceed with EGD.  Dolores Frame, MD 09/05/2022, 12:35 PM

## 2022-09-05 NOTE — H&P (View-Only) (Signed)
[name] is an 72 y.o. female.   Chief Complaint: NASH advanced fibrosis, rule out varices HPI: Kelita C Baskett is a 72 y.o. female with past medical history of  Hep C s/p treatment, in remission, anxiety, IBS-C, RA, COPD, GERD, opioid induced constipation, HTN, hypothyroidism, SVT, recurrent diverticulitis s/p partial colectomy and NASH with advanced fibrosis, coming for EGD for variceal screening.  The patient denies having any nausea, vomiting, fever, chills, hematochezia, melena, hematemesis, abdominal distention, abdominal pain, diarrhea, jaundice, pruritus or weight loss.  Notably, she has had chronic thrombocytopenia for multiple years, at least since 2008.  However this has recently worsened.   Past Medical History:  Diagnosis Date   Anxiety    Arthritis    Bronchitis    COPD (chronic obstructive pulmonary disease) (HCC)    Depression    Diverticulitis 04/2012   [facility]   Essential hypertension    Frequent PVCs    Outflow tract, positive in the inferior leads   GERD (gastroesophageal reflux disease)    Hepatitis C    Treated   History of pneumonia    Insomnia    Migraine headache    NASH (nonalcoholic steatohepatitis)    Sleep apnea    Splenomegaly    Thyroid disease    Wears glasses     Past Surgical History:  Procedure Laterality Date   ABDOMINAL HYSTERECTOMY     ANKLE SURGERY Right    BIOPSY  10/05/2020   Procedure: BIOPSY;  Surgeon: Castaneda Mayorga, Kyon Bentler, MD;  Location: [facility];  Service: Gastroenterology;;   COLON SURGERY     "part of color removed"   COLONOSCOPY     COLONOSCOPY WITH PROPOFOL N/A 11/13/2020   Castaneda:single non bleeding colonic angiodysplastic lesion, diverticulosis in sigmoid, non bleeding external hemorrhoids, no specimens   ESOPHAGEAL MANOMETRY N/A 04/10/2014   Procedure: ESOPHAGEAL MANOMETRY (EM);  Surgeon: Carl E Gessner, MD;  Location: [facility];  Service: Endoscopy;  Laterality: N/A;    ESOPHAGOGASTRODUODENOSCOPY     ESOPHAGOGASTRODUODENOSCOPY (EGD) WITH PROPOFOL N/A 10/05/2020   Castaneda: barrett's esophagus, short segment, 1cm hh, normal stomach, normal duodenum, biopsied, normal, no varices   INCISIONAL HERNIA REPAIR N/A 10/24/2015   Procedure: LAPAROSCOPIC REPAIR INCISIONAL HERNIA WITH MESH;  Surgeon: Burke Thompson, MD;  Location: [facility] OR;  Service: General;  Laterality: N/A;   INSERTION OF MESH N/A 10/24/2015   Procedure: INSERTION OF MESH;  Surgeon: Burke Thompson, MD;  Location: [facility] OR;  Service: General;  Laterality: N/A;   JOINT REPLACEMENT     KNEE SURGERY Right    x4   PH IMPEDANCE STUDY N/A 04/10/2014   Procedure: PH IMPEDANCE STUDY;  Surgeon: Carl E Gessner, MD;  Location: [facility];  Service: Endoscopy;  Laterality: N/A;   TOTAL SHOULDER ARTHROPLASTY Left 04/22/2018   Procedure: Left total shoulder arthroplasty;  Surgeon: Supple, Kevin, MD;  Location: [facility];  Service: Orthopedics;  Laterality: Left;  120min    Family History  Problem Relation Age of Onset   Alcohol abuse Father    Dementia Father    Dementia Maternal Grandmother    Bipolar disorder Daughter    Anxiety disorder Daughter    Depression Brother    Drug abuse Brother    Alcohol abuse Paternal Uncle    Depression Paternal Uncle    ADD / ADHD Neg Hx    Social History:  reports that she has never smoked. She has been exposed to tobacco smoke. She has never used smokeless tobacco.   She reports current alcohol use of about 3.0 standard drinks of alcohol per week. She reports that she does not use drugs.  Allergies:  Allergies  Allergen Reactions   Acetaminophen Other (See Comments)    Liver function per MD  Other Reaction(s): Not available   Levothyroxine Hives and Itching   Nabumetone Rash    Medications Prior to Admission  Medication Sig Dispense Refill   albuterol (PROVENTIL HFA;VENTOLIN HFA) 108 (90 Base) MCG/ACT inhaler Inhale 2 puffs into the lungs every 6 (six) hours as  needed for wheezing or shortness of breath.     ALPRAZolam (XANAX) 1 MG tablet Take 1 tablet (1 mg total) by mouth 4 (four) times daily. 120 tablet 3   amitriptyline (ELAVIL) 10 MG tablet Take 1 tablet (10 mg total) by mouth at bedtime. 90 tablet 3   FLUoxetine (PROZAC) 40 MG capsule Take 1 capsule (40 mg total) by mouth daily. 30 capsule 3   gabapentin (NEURONTIN) 300 MG capsule Take 300 mg by mouth 3 (three) times daily.     hyoscyamine (LEVSIN) 0.125 MG tablet Take 1 tablet (0.125 mg total) by mouth every 8 (eight) hours as needed (abdominal pain). 30 tablet 2   linaclotide (LINZESS) 290 MCG CAPS capsule TAKE ONE CAPSULE BY MOUTH ONCE DAILY BEFORE BREAKFAST. 90 capsule 0   loratadine (CLARITIN) 10 MG tablet Take 1 tablet by mouth daily.     meloxicam (MOBIC) 15 MG tablet Take 1 tablet (15 mg total) by mouth daily. 30 tablet 1   metoprolol succinate (TOPROL-XL) 100 MG 24 hr tablet TAKE ONE TABLET BY MOUTH TWICE DAILY WITH OR IMMEDIATELY FOLLOWING A MEAL. 180 tablet 1   omeprazole (PRILOSEC) 40 MG capsule Take 1 capsule (40 mg total) by mouth daily. 90 capsule 0   traMADol (ULTRAM) 50 MG tablet Take 50 mg by mouth 3 (three) times daily as needed for severe pain.     Ascorbic Acid (VITAMIN C) 1000 MG tablet Take 1,000 mg by mouth daily.     aspirin EC 81 MG tablet Take 81 mg by mouth daily.     cyanocobalamin 1000 MCG tablet Take 2,000 mcg by mouth daily.     ELDERBERRY PO Take 1 each by mouth daily.     Fluticasone-Umeclidin-Vilant 100-62.5-25 MCG/INH AEPB Inhale 1 puff into the lungs daily as needed (asthma).     metoCLOPramide (REGLAN) 10 MG tablet Take 10 mg by mouth every 6 (six) hours as needed for nausea or vomiting.     Multiple Vitamin (MULTI-VITAMIN PO) Take 1 tablet by mouth daily.     nystatin (MYCOSTATIN) powder Apply 1 g topically daily.  2   ondansetron (ZOFRAN) 4 MG tablet Take 1 tablet by mouth every 8 (eight) hours as needed for nausea or vomiting.     phenazopyridine  (PYRIDIUM) 200 MG tablet Take 1 tablet by mouth every 8 (eight) hours.     polyethylene glycol (MIRALAX) 17 g packet Take 17 g by mouth daily as needed for mild constipation or moderate constipation.     potassium chloride (KLOR-CON) 10 MEQ tablet Take 10 mEq by mouth as needed (LEG CRAMPS).     predniSONE (DELTASONE) 10 MG tablet Take 10 mg by mouth daily with breakfast.     rosuvastatin (CRESTOR) 10 MG tablet Take 1 tablet (10 mg total) by mouth daily. 90 tablet 1   Simethicone (GAS RELIEF PO) Take 2 capsules by mouth as needed (flatulence).      temazepam (RESTORIL) 30 MG capsule   Take 1 capsule (30 mg total) by mouth at bedtime as needed. for sleep 30 capsule 3    No results found for this or any previous visit (from the past 48 hour(s)). No results found.  Review of Systems  Temperature 97.9 F (36.6 C), temperature source Oral, resp. rate 12, SpO2 99 %. Physical Exam  GENERAL: The patient is AO x3, in no acute distress. HEENT: Head is normocephalic and atraumatic. EOMI are intact. Mouth is well hydrated and without lesions. NECK: Supple. No masses LUNGS: Clear to auscultation. No presence of rhonchi/wheezing/rales. Adequate chest expansion HEART: RRR, normal s1 and s2. ABDOMEN: Soft, nontender, no guarding, no peritoneal signs, and nondistended. BS +. No masses. EXTREMITIES: Without any cyanosis, clubbing, rash, lesions or edema. NEUROLOGIC: AOx3, no focal motor deficit. SKIN: no jaundice, no rashes  Assessment/Plan Kameela C Pulliam is a 71 y.o. female with past medical history of  Hep C s/p treatment, in remission, anxiety, IBS-C, RA, COPD, GERD, opioid induced constipation, HTN, hypothyroidism, SVT, recurrent diverticulitis s/p partial colectomy and NASH with advanced fibrosis, coming for EGD for variceal screening.  Will proceed with EGD.  Robbert Langlinais Castaneda Mayorga, MD 09/05/2022, 12:35 PM    

## 2022-09-05 NOTE — Anesthesia Postprocedure Evaluation (Signed)
Anesthesia Post Note  Patient: [name]  Procedure(s) Performed: ESOPHAGOGASTRODUODENOSCOPY (EGD) WITH PROPOFOL  Patient location during evaluation: Phase II Anesthesia Type: General Level of consciousness: awake and alert and oriented Pain management: pain level controlled Vital Signs Assessment: post-procedure vital signs reviewed and stable Respiratory status: spontaneous breathing, nonlabored ventilation and respiratory function stable Cardiovascular status: blood pressure returned to baseline and stable Postop Assessment: no apparent nausea or vomiting Anesthetic complications: no  No notable events documented.   Last Vitals:  Vitals:   09/05/22 1335 09/05/22 1339  BP: (!) 81/43 (!) 92/52  Pulse: 67   Resp: 19   Temp: 36.6 C   SpO2: 97%     Last Pain:  Vitals:   09/05/22 1335  TempSrc: Axillary  PainSc: 0-No pain                 [name]

## 2022-09-10 ENCOUNTER — Telehealth (INDEPENDENT_AMBULATORY_CARE_PROVIDER_SITE_OTHER): Payer: Self-pay

## 2022-09-10 ENCOUNTER — Encounter (HOSPITAL_COMMUNITY): Payer: Self-pay | Admitting: Gastroenterology

## 2022-09-10 NOTE — Telephone Encounter (Signed)
Thanks.  I spoke to the patient today.  I explained that I discussed her case with Dr. Diona Browner.  Given the high dose of Toprol she is currently taking, it would not be ideal to switch her to carvedilol.  Due to this, we will need to proceed with endoscopic banding.  The patient understood and agreed.  Hi Tanya,   Can you please schedule a esophagogastroduodenospy? Dx: Esophageal varices. Room: 3.  Please remind the patient that she needs to be on full liquid the day before her procedure  Thanks,  Katrinka Blazing, MD Gastroenterology and Hepatology [facility]

## 2022-09-10 NOTE — Telephone Encounter (Signed)
Patient recently had a Egd on 09/05/2022 and says she has not heard from anyone regarding the medication she is supposed to take. See below and advise. Thanks,  09/05/2022: Upper Gi  Will discuss with Dr. Diona Browner possibility of                            switching to carvedilol.                           - Repeat upper endoscopy for surveillance will                            depend on discussion of beta blocker switch with                            Dr. Diona Browner.

## 2022-09-11 ENCOUNTER — Telehealth (INDEPENDENT_AMBULATORY_CARE_PROVIDER_SITE_OTHER): Payer: Self-pay | Admitting: *Deleted

## 2022-09-11 NOTE — Telephone Encounter (Signed)
Pt scheduled for 09/16/22 at 10 am. Pre op telephone call on 09/15/22. Instructions sent via my chart.

## 2022-09-11 NOTE — Telephone Encounter (Signed)
It needs to be done within the next couple of months, if she can be put on 7/2 would be good. Has to stay on full liquids the day before.

## 2022-09-11 NOTE — Telephone Encounter (Signed)
Is there a certain length of time she needs to wait? I have an opening on 09/16/22. Please advise. Thank you

## 2022-09-11 NOTE — Telephone Encounter (Signed)
Pt left vm asking for paperwork on procedure she is suppose to have coming up. I think dr. Levon Hedger just sent you a message on this yesterday and is not scheduled yet.   [phone]

## 2022-09-11 NOTE — Telephone Encounter (Signed)
Pt contacted. Pt states she is needing to know what is going to be done exactly. Tried to explain to patient what an EGD was. Pt states she is needing to know what provider is going to do with rubber bands. Attempted to explain what happens with the esophageal banding but I'm not sure if I was confusing patient or if pt was getting more confused. Please advise. Thank you

## 2022-09-15 ENCOUNTER — Encounter (HOSPITAL_COMMUNITY)
Admission: RE | Admit: 2022-09-15 | Discharge: 2022-09-15 | Disposition: A | Discharge status: Home / Self Care | Payer: 59 | Source: Ambulatory Visit | Attending: Gastroenterology | Admitting: Gastroenterology

## 2022-09-16 ENCOUNTER — Ambulatory Visit (HOSPITAL_BASED_OUTPATIENT_CLINIC_OR_DEPARTMENT_OTHER): Payer: 59 | Admitting: Certified Registered"

## 2022-09-16 ENCOUNTER — Encounter (HOSPITAL_COMMUNITY)
Admission: RE | Disposition: A | Discharge status: Home / Self Care | Payer: Self-pay | Source: Home / Self Care | Attending: Gastroenterology

## 2022-09-16 ENCOUNTER — Ambulatory Visit (HOSPITAL_COMMUNITY): Payer: 59 | Admitting: Certified Registered"

## 2022-09-16 ENCOUNTER — Ambulatory Visit (HOSPITAL_COMMUNITY)
Admission: RE | Admit: 2022-09-16 | Discharge: 2022-09-16 | Disposition: A | Discharge status: Home / Self Care | Payer: 59 | Attending: Gastroenterology | Admitting: Gastroenterology

## 2022-09-16 DIAGNOSIS — I8500 Esophageal varices without bleeding: Secondary | ICD-10-CM | POA: Diagnosis not present

## 2022-09-16 DIAGNOSIS — M069 Rheumatoid arthritis, unspecified: Secondary | ICD-10-CM | POA: Diagnosis not present

## 2022-09-16 DIAGNOSIS — D696 Thrombocytopenia, unspecified: Secondary | ICD-10-CM | POA: Diagnosis not present

## 2022-09-16 DIAGNOSIS — Z8619 Personal history of other infectious and parasitic diseases: Secondary | ICD-10-CM | POA: Insufficient documentation

## 2022-09-16 DIAGNOSIS — Z9049 Acquired absence of other specified parts of digestive tract: Secondary | ICD-10-CM | POA: Diagnosis not present

## 2022-09-16 DIAGNOSIS — I10 Essential (primary) hypertension: Secondary | ICD-10-CM | POA: Diagnosis not present

## 2022-09-16 DIAGNOSIS — K7581 Nonalcoholic steatohepatitis (NASH): Secondary | ICD-10-CM | POA: Diagnosis not present

## 2022-09-16 DIAGNOSIS — G4730 Sleep apnea, unspecified: Secondary | ICD-10-CM | POA: Insufficient documentation

## 2022-09-16 DIAGNOSIS — K3189 Other diseases of stomach and duodenum: Secondary | ICD-10-CM | POA: Diagnosis not present

## 2022-09-16 DIAGNOSIS — K766 Portal hypertension: Secondary | ICD-10-CM | POA: Diagnosis not present

## 2022-09-16 DIAGNOSIS — K219 Gastro-esophageal reflux disease without esophagitis: Secondary | ICD-10-CM | POA: Diagnosis not present

## 2022-09-16 DIAGNOSIS — J449 Chronic obstructive pulmonary disease, unspecified: Secondary | ICD-10-CM | POA: Diagnosis not present

## 2022-09-16 DIAGNOSIS — Z8719 Personal history of other diseases of the digestive system: Secondary | ICD-10-CM | POA: Insufficient documentation

## 2022-09-16 DIAGNOSIS — E039 Hypothyroidism, unspecified: Secondary | ICD-10-CM | POA: Insufficient documentation

## 2022-09-16 DIAGNOSIS — Z09 Encounter for follow-up examination after completed treatment for conditions other than malignant neoplasm: Secondary | ICD-10-CM | POA: Insufficient documentation

## 2022-09-16 DIAGNOSIS — I471 Supraventricular tachycardia, unspecified: Secondary | ICD-10-CM | POA: Insufficient documentation

## 2022-09-16 DIAGNOSIS — K7460 Unspecified cirrhosis of liver: Secondary | ICD-10-CM | POA: Diagnosis not present

## 2022-09-16 DIAGNOSIS — I8510 Secondary esophageal varices without bleeding: Secondary | ICD-10-CM

## 2022-09-16 DIAGNOSIS — F32A Depression, unspecified: Secondary | ICD-10-CM | POA: Insufficient documentation

## 2022-09-16 DIAGNOSIS — Z6834 Body mass index (BMI) 34.0-34.9, adult: Secondary | ICD-10-CM

## 2022-09-16 DIAGNOSIS — F419 Anxiety disorder, unspecified: Secondary | ICD-10-CM | POA: Insufficient documentation

## 2022-09-16 DIAGNOSIS — T402X5A Adverse effect of other opioids, initial encounter: Secondary | ICD-10-CM | POA: Insufficient documentation

## 2022-09-16 DIAGNOSIS — K581 Irritable bowel syndrome with constipation: Secondary | ICD-10-CM | POA: Insufficient documentation

## 2022-09-16 DIAGNOSIS — K5903 Drug induced constipation: Secondary | ICD-10-CM | POA: Diagnosis not present

## 2022-09-16 DIAGNOSIS — F1010 Alcohol abuse, uncomplicated: Secondary | ICD-10-CM | POA: Diagnosis not present

## 2022-09-16 HISTORY — PX: ESOPHAGEAL BANDING: SHX5518

## 2022-09-16 HISTORY — PX: ESOPHAGOGASTRODUODENOSCOPY (EGD) WITH PROPOFOL: SHX5813

## 2022-09-16 SURGERY — ESOPHAGOGASTRODUODENOSCOPY (EGD) WITH PROPOFOL
Anesthesia: General

## 2022-09-16 MED ORDER — OMEPRAZOLE 40 MG PO CPDR: 40.0000 mg | DELAYED_RELEASE_CAPSULE | Freq: Two times a day (BID) | ORAL | 0 refills | Status: DC

## 2022-09-16 MED ORDER — PROPOFOL 10 MG/ML IV BOLUS
INTRAVENOUS | Status: DC | PRN
  Administered 2022-09-16: 100 mg via INTRAVENOUS

## 2022-09-16 MED ORDER — PROPOFOL 500 MG/50ML IV EMUL
INTRAVENOUS | Status: DC | PRN
  Administered 2022-09-16: 150 ug/kg/min via INTRAVENOUS

## 2022-09-16 MED ORDER — LIDOCAINE HCL (CARDIAC) PF 100 MG/5ML IV SOSY
PREFILLED_SYRINGE | INTRAVENOUS | Status: DC | PRN
  Administered 2022-09-16: 50 mg via INTRAVENOUS

## 2022-09-16 MED ORDER — LACTATED RINGERS IV SOLN: INTRAVENOUS | Status: DC | PRN

## 2022-09-16 NOTE — Interval H&P Note (Signed)
History and Physical Interval Note:  09/16/2022 9:28 AM  [name]  has presented today for surgery, with the diagnosis of esophageal varices.  The various methods of treatment have been discussed with the patient and family. After consideration of risks, benefits and other options for treatment, the patient has consented to  Procedure(s) with comments: ESOPHAGOGASTRODUODENOSCOPY (EGD) WITH PROPOFOL (N/A) - 10:00am;asa 3 as a surgical intervention.  The patient's history has been reviewed, patient examined, no change in status, stable for surgery.  I have reviewed the patient's chart and labs.  Questions were answered to the patient's satisfaction.     [name]

## 2022-09-16 NOTE — Anesthesia Preprocedure Evaluation (Signed)
Anesthesia Evaluation  Patient identified by MRN, date of birth, ID band Patient awake    Reviewed: Allergy & Precautions, H&P , NPO status , Patient's Chart, lab work & pertinent test results, reviewed documented beta blocker date and time   Airway Mallampati: II  TM Distance: >3 FB Neck ROM: full    Dental no notable dental hx.    Pulmonary neg pulmonary ROS, shortness of breath, sleep apnea , COPD   Pulmonary exam normal breath sounds clear to auscultation       Cardiovascular Exercise Tolerance: Good hypertension, negative cardio ROS  Rhythm:regular Rate:Normal     Neuro/Psych  Headaches PSYCHIATRIC DISORDERS Anxiety Depression    negative neurological ROS  negative psych ROS   GI/Hepatic negative GI ROS,GERD  ,,(+) Cirrhosis   Esophageal Varices    , Hepatitis -  Endo/Other  negative endocrine ROS    Renal/GU Renal diseasenegative Renal ROS  negative genitourinary   Musculoskeletal   Abdominal   Peds  Hematology negative hematology ROS (+) Blood dyscrasia, anemia   Anesthesia Other Findings   Reproductive/Obstetrics negative OB ROS                             Anesthesia Physical Anesthesia Plan  ASA: 3  Anesthesia Plan: General   Post-op Pain Management:    Induction:   PONV Risk Score and Plan: Propofol infusion  Airway Management Planned:   Additional Equipment:   Intra-op Plan:   Post-operative Plan:   Informed Consent: I have reviewed the patients History and Physical, chart, labs and discussed the procedure including the risks, benefits and alternatives for the proposed anesthesia with the patient or authorized representative who has indicated his/her understanding and acceptance.     Dental Advisory Given  Plan Discussed with: CRNA  Anesthesia Plan Comments:        Anesthesia Quick Evaluation

## 2022-09-16 NOTE — Op Note (Signed)
[facility] Patient Name: [name] Procedure Date: 09/16/2022 10:14 AM MRN: [id number] Date of Birth: 06-Jan-1951 Attending MD: Katrinka Blazing , , [id number] CSN: [id number] Age: 72 Admit Type: Outpatient Procedure:                Upper GI endoscopy Indications:              Esophageal varices Providers:                Katrinka Blazing, Angelica Ran, Dyann Ruddle Referring MD:              Medicines:                Monitored Anesthesia Care Complications:            No immediate complications. Estimated Blood Loss:     Estimated blood loss: none. Procedure:                Pre-Anesthesia Assessment:                           - Prior to the procedure, a History and Physical                            was performed, and patient medications, allergies                            and sensitivities were reviewed. The patient's                            tolerance of previous anesthesia was reviewed.                           - The risks and benefits of the procedure and the                            sedation options and risks were discussed with the                            patient. All questions were answered and informed                            consent was obtained.                           After obtaining informed consent, the endoscope was                            passed under direct vision. Throughout the                            procedure, the patient's blood pressure, pulse, and                            oxygen saturations were monitored continuously. The                            GIF-H190 ([id number]) scope was  introduced through the                            mouth, and advanced to the second part of duodenum.                            The upper GI endoscopy was accomplished without                            difficulty. The patient tolerated the procedure                            well. Scope In: 10:29:14 AM Scope Out: 10:36:10 AM Total Procedure Duration: 0  hours 6 minutes 56 seconds  Findings:      Grade II varices were found in the lower third of the esophagus. Two       bands were successfully placed with complete eradication, resulting in       deflation of varices. There was no bleeding during the procedure.      Portal hypertensive gastropathy was found in the entire examined stomach.      The examined duodenum was normal. Impression:               - Grade II esophageal varices. Completely                            eradicated. Banded.                           - Portal hypertensive gastropathy.                           - Normal examined duodenum.                           - No specimens collected. Moderate Sedation:      Per Anesthesia Care Recommendation:           - Discharge patient to home (ambulatory).                           - Resume previous diet. Try first liquid diet for a                            few days and slowly advance as tolerated - chew                            food thoroughly.                           - Repeat upper endoscopy in 8 weeks for                            surveillance.                           - Use Prilosec (omeprazole) 40 mg PO BID. Procedure Code(s):        ---  Professional ---                           [id number], Esophagogastroduodenoscopy, flexible,                            transoral; with band ligation of esophageal/gastric                            varices Diagnosis Code(s):        --- Professional ---                           I85.00, Esophageal varices without bleeding                           K76.6, Portal hypertension                           K31.89, Other diseases of stomach and duodenum CPT copyright 2022 [employer]. All rights reserved. The codes documented in this report are preliminary and upon coder review may  be revised to meet current compliance requirements. Katrinka Blazing, MD Katrinka Blazing,  09/16/2022 10:45:49 AM This report has been signed  electronically. Number of Addenda: 0

## 2022-09-16 NOTE — Discharge Instructions (Addendum)
You are being discharged to home.  Resume your previous diet. Try first liquid diet for a few days and slowly advance as tolerated - chew food thoroughly. Your physician has recommended a repeat upper endoscopy in eight weeks for surveillance.  Take Prilosec (omeprazole) 40 mg by mouth twice a day.

## 2022-09-16 NOTE — Transfer of Care (Signed)
Immediate Anesthesia Transfer of Care Note  Patient: [name]  Procedure(s) Performed: ESOPHAGOGASTRODUODENOSCOPY (EGD) WITH PROPOFOL ESOPHAGEAL BANDING  Patient Location: Short Stay  Anesthesia Type:General  Level of Consciousness: awake  Airway & Oxygen Therapy: Patient Spontanous Breathing  Post-op Assessment: Report given to RN and Post -op Vital signs reviewed and stable  Post vital signs: Reviewed and stable  Last Vitals:  Vitals Value Taken Time  BP    Temp    Pulse    Resp    SpO2      Last Pain:  Vitals:   09/16/22 1021  TempSrc:   PainSc: 0-No pain      Patients Stated Pain Goal: 7 (09/16/22 1610)  Complications: No notable events documented.

## 2022-09-16 NOTE — Anesthesia Procedure Notes (Signed)
Date/Time: 09/16/2022 10:27 AM  Performed by: Julian Reil, CRNAPre-anesthesia Checklist: Patient identified, Emergency Drugs available, Suction available and Patient being monitored Patient Re-evaluated:Patient Re-evaluated prior to induction Oxygen Delivery Method: Nasal cannula Induction Type: IV induction Placement Confirmation: positive ETCO2

## 2022-09-17 ENCOUNTER — Telehealth (INDEPENDENT_AMBULATORY_CARE_PROVIDER_SITE_OTHER): Payer: Self-pay | Admitting: Gastroenterology

## 2022-09-17 NOTE — Telephone Encounter (Signed)
Dolores Frame, MD  Marlowe Shores, LPN Hi Kenney Houseman,  Can you please schedule a EGD in 6-8 weeks? Dx: esiophageal varices. Room: 3  Thanks,  Katrinka Blazing, MD Gastroenterology and Hepatology [facility]

## 2022-09-18 NOTE — Anesthesia Postprocedure Evaluation (Signed)
Anesthesia Post Note  Patient: [name]  Procedure(s) Performed: ESOPHAGOGASTRODUODENOSCOPY (EGD) WITH PROPOFOL ESOPHAGEAL BANDING  Patient location during evaluation: Phase II Anesthesia Type: General Level of consciousness: awake Pain management: pain level controlled Vital Signs Assessment: post-procedure vital signs reviewed and stable Respiratory status: spontaneous breathing and respiratory function stable Cardiovascular status: blood pressure returned to baseline and stable Postop Assessment: no headache and no apparent nausea or vomiting Anesthetic complications: no Comments: Late entry   No notable events documented.   Last Vitals:  Vitals:   09/16/22 0812 09/16/22 1041  BP: (!) 112/59 136/74  Pulse: 66 67  Resp: 17 19  Temp: 36.4 C (!) 36.3 C  SpO2: 98% 99%    Last Pain:  Vitals:   09/17/22 1308  TempSrc:   PainSc: 0-No pain                 Windell Norfolk

## 2022-09-23 ENCOUNTER — Telehealth (INDEPENDENT_AMBULATORY_CARE_PROVIDER_SITE_OTHER): Payer: 59 | Admitting: Psychiatry

## 2022-09-23 ENCOUNTER — Encounter (HOSPITAL_COMMUNITY): Payer: Self-pay | Admitting: Psychiatry

## 2022-09-23 DIAGNOSIS — R109 Unspecified abdominal pain: Secondary | ICD-10-CM

## 2022-09-23 DIAGNOSIS — F331 Major depressive disorder, recurrent, moderate: Secondary | ICD-10-CM | POA: Diagnosis not present

## 2022-09-23 MED ORDER — FLUOXETINE HCL 40 MG PO CAPS: 40.0000 mg | ORAL_CAPSULE | Freq: Every day | ORAL | 3 refills | Status: DC

## 2022-09-23 MED ORDER — AMITRIPTYLINE HCL 10 MG PO TABS: 10.0000 mg | ORAL_TABLET | Freq: Every day | ORAL | 3 refills | Status: DC

## 2022-09-23 MED ORDER — ALPRAZOLAM 1 MG PO TABS: 1.0000 mg | ORAL_TABLET | Freq: Four times a day (QID) | ORAL | 3 refills | Status: DC

## 2022-09-23 MED ORDER — TEMAZEPAM 30 MG PO CAPS: 30.0000 mg | ORAL_CAPSULE | Freq: Every evening | ORAL | 3 refills | Status: DC | PRN

## 2022-09-23 NOTE — Progress Notes (Signed)
Virtual Visit via Telephone Note  I connected with [name] on 09/23/22 at  1:00 PM EDT by telephone and verified that I am speaking with the correct person using two identifiers.  Location: Patient: home Provider: office   I discussed the limitations, risks, security and privacy concerns of performing an evaluation and management service by telephone and the availability of in person appointments. I also discussed with the patient that there may be a patient responsible charge related to this service. The patient expressed understanding and agreed to proceed.      I discussed the assessment and treatment plan with the patient. The patient was provided an opportunity to ask questions and all were answered. The patient agreed with the plan and demonstrated an understanding of the instructions.   The patient was advised to call back or seek an in-person evaluation if the symptoms worsen or if the condition fails to improve as anticipated.  I provided 20 minutes of non-face-to-face time during this encounter.   Diannia Ruder, MD  [facility] MD/PA/NP OP Progress Note  09/23/2022 1:21 PM [name]  MRN:  [id number]  Chief Complaint:  Chief Complaint  Patient presents with   Anxiety   Depression   Follow-up   HPI: This patient is a 72 year old widowed white female who lives with her daughter in [place].  She is on disability.  The patient returns for follow-up after 4 months regarding her depression and anxiety.  She states that she is having more complications from NASH.  Last week she had to have some esophageal varices banded and she is going to have to have it redone in 2 months.  It is hurting somewhat where the the bands were placed and she is having some trouble eating.  She states that her mood is up-and-down but overall feels like her medications are helpful.  Usually she has trouble initially falling to sleep but does sleep well and does not feel tired the next day.  Her daughter  has moved in to help her.  She denies any thoughts of self-harm or suicide Visit Diagnosis:    ICD-10-CM   1. Functional abdominal pain syndrome  R10.9 amitriptyline (ELAVIL) 10 MG tablet      Past Psychiatric History: none  Past Medical History:  Past Medical History:  Diagnosis Date   Anxiety    Arthritis    Bronchitis    COPD (chronic obstructive pulmonary disease) (HCC)    Depression    Diverticulitis 04/2012   [facility]   Essential hypertension    Frequent PVCs    Outflow tract, positive in the inferior leads   GERD (gastroesophageal reflux disease)    Hepatitis C    Treated   History of pneumonia    Insomnia    Migraine headache    NASH (nonalcoholic steatohepatitis)    Sleep apnea    Splenomegaly    Thyroid disease    Wears glasses     Past Surgical History:  Procedure Laterality Date   ABDOMINAL HYSTERECTOMY     ANKLE SURGERY Right    BIOPSY  10/05/2020   Procedure: BIOPSY;  Surgeon: Dolores Frame, MD;  [facility];  Service: Gastroenterology;;   COLON SURGERY     "part of color removed"   COLONOSCOPY     COLONOSCOPY WITH PROPOFOL N/A 11/13/2020   Castaneda:single non bleeding colonic angiodysplastic lesion, diverticulosis in sigmoid, non bleeding external hemorrhoids, no specimens   ESOPHAGEAL MANOMETRY N/A 04/10/2014  Procedure: ESOPHAGEAL MANOMETRY (EM);  Surgeon: Iva Boop, MD;  Location: [facility];  Service: Endoscopy;  Laterality: N/A;   ESOPHAGOGASTRODUODENOSCOPY     ESOPHAGOGASTRODUODENOSCOPY (EGD) WITH PROPOFOL N/A 10/05/2020   Castaneda: barrett's esophagus, short segment, 1cm hh, normal stomach, normal duodenum, biopsied, normal, no varices   ESOPHAGOGASTRODUODENOSCOPY (EGD) WITH PROPOFOL N/A 09/05/2022   Procedure: ESOPHAGOGASTRODUODENOSCOPY (EGD) WITH PROPOFOL;  Surgeon: Dolores Frame, MD;  [facility];  Service: Gastroenterology;  Laterality: N/A;  2:00PM;ASA 3   INCISIONAL  HERNIA REPAIR N/A 10/24/2015   Procedure: LAPAROSCOPIC REPAIR INCISIONAL HERNIA WITH MESH;  Surgeon: Violeta Gelinas, MD;  Location: [facility] OR;  Service: General;  Laterality: N/A;   INSERTION OF MESH N/A 10/24/2015   Procedure: INSERTION OF MESH;  Surgeon: Violeta Gelinas, MD;  Location: [facility] OR;  Service: General;  Laterality: N/A;   JOINT REPLACEMENT     KNEE SURGERY Right    x4   PH IMPEDANCE STUDY N/A 04/10/2014   Procedure: PH IMPEDANCE STUDY;  Surgeon: Iva Boop, MD;  [facility]: [facility];  Service: Endoscopy;  Laterality: N/A;   TOTAL SHOULDER ARTHROPLASTY Left 04/22/2018   Procedure: Left total shoulder arthroplasty;  Surgeon: Francena Hanly, MD;  Location: [facility];  Service: Orthopedics;  Laterality: Left;     Family Psychiatric History: See below  Family History:  Family History  Problem Relation Age of Onset   Alcohol abuse Father    Dementia Father    Dementia Maternal Grandmother    Bipolar disorder Daughter    Anxiety disorder Daughter    Depression Brother    Drug abuse Brother    Alcohol abuse Paternal Uncle    Depression Paternal Uncle    ADD / ADHD Neg Hx     Social History:  Social History   Socioeconomic History   Marital status: Widowed    Spouse name: Not on file   Number of children: 2   Years of education: 12th grade   Highest education level: Not on file  Occupational History   Occupation: RETIRED  Tobacco Use   Smoking status: Never    Passive exposure: Current   Smokeless tobacco: Never  Vaping Use   Vaping Use: Never used  Substance and Sexual Activity   Alcohol use: Yes    Alcohol/week: 3.0 standard drinks of alcohol    Types: 3 Cans of beer per week    Comment: occasionally   Drug use: No   Sexual activity: Never  Other Topics Concern   Not on file  Social History Narrative   Not on file   Social Determinants of Health   Financial Resource Strain: Not on file  Food Insecurity: Not on file  Transportation Needs: Not on  file  Physical Activity: Not on file  Stress: Not on file  Social Connections: Not on file    Allergies:  Allergies  Allergen Reactions   Acetaminophen Other (See Comments)    Liver function per MD  Other Reaction(s): Not available   Levothyroxine Hives and Itching   Nabumetone Rash    Metabolic Disorder Labs: No results found for: "HGBA1C", "MPG" No results found for: "PROLACTIN" No results found for: "CHOL", "TRIG", "HDL", "CHOLHDL", "VLDL", "LDLCALC" Lab Results  Component Value Date   TSH 3.26 10/17/2019   TSH 4.540 (H) 10/28/2007    Therapeutic Level Labs: No results found for: "LITHIUM" No results found for: "VALPROATE" No results found for: "CBMZ"  Current Medications: Current Outpatient Medications  Medication Sig Dispense  Refill   albuterol (PROVENTIL HFA;VENTOLIN HFA) 108 (90 Base) MCG/ACT inhaler Inhale 2 puffs into the lungs every 6 (six) hours as needed for wheezing or shortness of breath.     ALPRAZolam (XANAX) 1 MG tablet Take 1 tablet (1 mg total) by mouth 4 (four) times daily. 120 tablet 3   amitriptyline (ELAVIL) 10 MG tablet Take 1 tablet (10 mg total) by mouth at bedtime. 90 tablet 3   Ascorbic Acid (VITAMIN C) 1000 MG tablet Take 1,000 mg by mouth daily.     aspirin EC 81 MG tablet Take 81 mg by mouth daily.     cyanocobalamin 1000 MCG tablet Take 2,000 mcg by mouth daily.     ELDERBERRY PO Take 1 each by mouth daily.     FLUoxetine (PROZAC) 40 MG capsule Take 1 capsule (40 mg total) by mouth daily. 30 capsule 3   Fluticasone-Umeclidin-Vilant 100-62.5-25 MCG/INH AEPB Inhale 1 puff into the lungs daily as needed (asthma).     gabapentin (NEURONTIN) 300 MG capsule Take 300 mg by mouth 3 (three) times daily.     hyoscyamine (LEVSIN) 0.125 MG tablet Take 1 tablet (0.125 mg total) by mouth every 8 (eight) hours as needed (abdominal pain). 30 tablet 2   linaclotide (LINZESS) 290 MCG CAPS capsule TAKE ONE CAPSULE BY MOUTH ONCE DAILY BEFORE BREAKFAST. 90  capsule 0   loratadine (CLARITIN) 10 MG tablet Take 1 tablet by mouth daily.     meloxicam (MOBIC) 15 MG tablet Take 1 tablet (15 mg total) by mouth daily. 30 tablet 1   metoCLOPramide (REGLAN) 10 MG tablet Take 10 mg by mouth every 6 (six) hours as needed for nausea or vomiting.     metoprolol succinate (TOPROL-XL) 100 MG 24 hr tablet TAKE ONE TABLET BY MOUTH TWICE DAILY WITH OR IMMEDIATELY FOLLOWING A MEAL. 180 tablet 1   Multiple Vitamin (MULTI-VITAMIN PO) Take 1 tablet by mouth daily.     nystatin (MYCOSTATIN) powder Apply 1 g topically daily.  2   omeprazole (PRILOSEC) 40 MG capsule Take 1 capsule (40 mg total) by mouth 2 (two) times daily. 120 capsule 0   ondansetron (ZOFRAN) 4 MG tablet Take 1 tablet by mouth every 8 (eight) hours as needed for nausea or vomiting.     phenazopyridine (PYRIDIUM) 200 MG tablet Take 1 tablet by mouth every 8 (eight) hours.     polyethylene glycol (MIRALAX) 17 g packet Take 17 g by mouth daily as needed for mild constipation or moderate constipation.     potassium chloride (KLOR-CON) 10 MEQ tablet Take 10 mEq by mouth as needed (LEG CRAMPS).     predniSONE (DELTASONE) 10 MG tablet Take 10 mg by mouth daily with breakfast.     rosuvastatin (CRESTOR) 10 MG tablet Take 1 tablet (10 mg total) by mouth daily. 90 tablet 1   Simethicone (GAS RELIEF PO) Take 2 capsules by mouth as needed (flatulence).      temazepam (RESTORIL) 30 MG capsule Take 1 capsule (30 mg total) by mouth at bedtime as needed. for sleep 30 capsule 3   traMADol (ULTRAM) 50 MG tablet Take 50 mg by mouth 3 (three) times daily as needed for severe pain.     No current facility-administered medications for this visit.     Musculoskeletal: Strength & Muscle Tone: na Gait & Station: na Patient leans: N/A  Psychiatric Specialty Exam: Review of Systems  Gastrointestinal:  Positive for abdominal pain.  All other systems reviewed and are negative.  There were no vitals taken for this  visit.There is no height or weight on file to calculate BMI.  General Appearance: NA  Eye Contact:  NA  Speech:  Clear and Coherent  Volume:  Normal  Mood:  Anxious and Euthymic  Affect:  na  Thought Process:  Goal Directed  Orientation:  Full (Time, Place, and Person)  Thought Content: Rumination   Suicidal Thoughts:  No  Homicidal Thoughts:  No  Memory:  Immediate;   Good Recent;   Good Remote;   NA  Judgement:  Good  Insight:  Good  Psychomotor Activity:  Decreased  Concentration:  Concentration: Good and Attention Span: Good  Recall:  Good  Fund of Knowledge: Good  Language: Good  Akathisia:  No  Handed:  Right  AIMS (if indicated): not done  Assets:  Communication Skills Desire for Improvement Resilience Social Support Talents/Skills  ADL's:  Intact  Cognition: WNL  Sleep:  Fair   Screenings: PHQ2-9    Flowsheet Row Video Visit from 11/14/2021 in [facility] [place] Video Visit from 07/29/2021 in [facility] [place] Video Visit from 05/07/2021 in [facility] at [facility] Video Visit from 12/31/2020 in [facility] at [place] Video Visit from 08/27/2020 in [facility] at [facility] Total Score 0 1 1 0 1      Flowsheet Row Admission (Discharged) from 09/16/2022 in [facility] [facility] Admission (Discharged) from 09/05/2022 in [facility] Pre-Admission Testing 60 from 09/02/2022 in [place] [facility]  C-SSRS RISK CATEGORY No Risk No Risk No Risk        Assessment and Plan: This patient is a 72 year old female with a history of depression and anxiety.  She is going through a lot of medical issues but for the most part her medications are helpful.  She will continue Prozac 40 mg daily for depression, Xanax 1 mg 4 times daily for anxiety, Restoril 30 mg at bedtime for sleep and amitriptyline  10 mg at bedtime for abdominal pain and sleep.  She will return to see me in 4 months  Collaboration of Care: Collaboration of Care: Other provider involved in patient's care AEB notes are shared with GI on the epic system  Patient/Guardian was advised Release of Information must be obtained prior to any record release in order to collaborate their care with an outside provider. Patient/Guardian was advised if they have not already done so to contact the registration department to sign all necessary forms in order for [place] to release information regarding their care.   Consent: Patient/Guardian gives verbal consent for treatment and assignment of benefits for services provided during this visit. Patient/Guardian expressed understanding and agreed to proceed.    Diannia Ruder, MD 09/23/2022, 1:21 PM

## 2022-10-06 ENCOUNTER — Encounter (INDEPENDENT_AMBULATORY_CARE_PROVIDER_SITE_OTHER): Payer: Self-pay | Admitting: *Deleted

## 2022-10-09 ENCOUNTER — Telehealth (INDEPENDENT_AMBULATORY_CARE_PROVIDER_SITE_OTHER): Payer: Self-pay

## 2022-10-09 NOTE — Telephone Encounter (Signed)
I called and left a message for the patient that if she is still needing assistance to call the office.  EGD done on 09/16/2022 now has lower right side back pain. Patient had two bands placed at that time due to Grade 2 varices.

## 2022-10-09 NOTE — Telephone Encounter (Signed)
I spoke with the patient and she says she has right lower back pain, and  a lot of gas. She says she feels a pulling feeling, it hurts to get up an walk. She denies any abdominal pain, bloody or dark stools, or fever. She says she has been taking her omeprazole 40 mg bid and has an appointment with Dr.Vyas tomorrow.

## 2022-10-10 NOTE — Telephone Encounter (Signed)
I called and left a detailed message on the patient voice mail as she did not pick up the phone. I advised that she may try IBGard, and Gas x. I advised that if she was still having issues with her back pain, she would need to follow up with her primary care doctor.

## 2022-10-24 DIAGNOSIS — I10 Essential (primary) hypertension: Secondary | ICD-10-CM | POA: Diagnosis not present

## 2022-10-24 DIAGNOSIS — K7460 Unspecified cirrhosis of liver: Secondary | ICD-10-CM | POA: Diagnosis not present

## 2022-10-24 DIAGNOSIS — Z79899 Other long term (current) drug therapy: Secondary | ICD-10-CM | POA: Diagnosis not present

## 2022-10-24 DIAGNOSIS — D509 Iron deficiency anemia, unspecified: Secondary | ICD-10-CM | POA: Diagnosis not present

## 2022-10-24 DIAGNOSIS — R5383 Other fatigue: Secondary | ICD-10-CM | POA: Diagnosis not present

## 2022-10-24 DIAGNOSIS — Z299 Encounter for prophylactic measures, unspecified: Secondary | ICD-10-CM | POA: Diagnosis not present

## 2022-10-24 DIAGNOSIS — E7800 Pure hypercholesterolemia, unspecified: Secondary | ICD-10-CM | POA: Diagnosis not present

## 2022-10-30 ENCOUNTER — Other Ambulatory Visit (INDEPENDENT_AMBULATORY_CARE_PROVIDER_SITE_OTHER): Payer: Self-pay | Admitting: Gastroenterology

## 2022-11-03 ENCOUNTER — Telehealth (INDEPENDENT_AMBULATORY_CARE_PROVIDER_SITE_OTHER): Payer: Self-pay | Admitting: *Deleted

## 2022-11-03 NOTE — Telephone Encounter (Signed)
Pt called and wanted to schedule her next surgery at [facility].  [phone]. She said to call this number because her cell is not working.

## 2022-11-04 DIAGNOSIS — D696 Thrombocytopenia, unspecified: Secondary | ICD-10-CM | POA: Diagnosis not present

## 2022-11-04 DIAGNOSIS — M179 Osteoarthritis of knee, unspecified: Secondary | ICD-10-CM | POA: Diagnosis not present

## 2022-11-04 DIAGNOSIS — Z0000 Encounter for general adult medical examination without abnormal findings: Secondary | ICD-10-CM | POA: Diagnosis not present

## 2022-11-04 DIAGNOSIS — Z299 Encounter for prophylactic measures, unspecified: Secondary | ICD-10-CM | POA: Diagnosis not present

## 2022-11-04 DIAGNOSIS — M545 Low back pain, unspecified: Secondary | ICD-10-CM | POA: Diagnosis not present

## 2022-11-04 DIAGNOSIS — I10 Essential (primary) hypertension: Secondary | ICD-10-CM | POA: Diagnosis not present

## 2022-11-07 ENCOUNTER — Telehealth (INDEPENDENT_AMBULATORY_CARE_PROVIDER_SITE_OTHER): Payer: Self-pay | Admitting: *Deleted

## 2022-11-07 NOTE — Telephone Encounter (Signed)
Room 3, avoid tramadol use the day before Thanks

## 2022-11-07 NOTE — Telephone Encounter (Signed)
Referring MD/PCP: Sherril Croon  Insurance: [facility] [id number] & Medicaid [id number]  Best Phone Number: [phone] or [phone]  Reason for the EGD esophageal varices 8 week recall  Has patient had this procedure before?  Yes, 09/2022  If so, when, by whom and where?    Is there a family history of colon cancer?    Who?  What age when diagnosed?    Is patient diabetic? If yes, Type 1 or Type 2   no      Does patient have prosthetic heart valve or mechanical valve?  no  Do you have a pacemaker/defibrillator?  no  Has patient ever had endocarditis/atrial fibrillation? No has abnormal heart rhythm  Has patient had joint replacement within last 12 months?  no  Is patient constipated or do they take laxatives? no  Does patient have a history of alcohol/drug use?  no  Does patient use oxygen? no  Have you had a stroke/heart attack last 6 mths? no  Do you take medicine for weight loss?  no  For female patients,: have you had a hysterectomy yes                      are you post menopausal                       do you still have your menstrual cycle   Do you take any blood-thinning medications such as: (aspirin, warfarin, Plavix, Aggrenox)  yes  If yes we need the name, milligram, dosage and who is prescribing doctor asa 81 mg daily  Medications: albuterol 108 mcg 2 puffs, alprazolam 1 mg 4 times daily, amitriptyline 10 mg daily, aspirin 81 mg daily, cyanocobalamin 1000 mcg twice daily, elderberry daily, fluoxetine 40 mg daily, fluticasone 1 puff, gabapentin 300 mg 3 tabs daily, gas relief prn, hyoscyamine 0.125 mg, linaclotide 290 mcg daily, meloxicam 15 mg daily, metoprolol 100 mg twice daily, multi vit daily, nystatin 1 g daily, omeprazole 40 mg daily, rosuvastatin 10 mg daily,  temazepam 30 mg at bedtime, tramadol 50 mg 3 tabs daily, VIt C 1000 mg daily  Allergies: acetaminophen, levothyroxine, nabumetone  Pharmacy: [facility]

## 2022-11-20 NOTE — Telephone Encounter (Signed)
Left message to return call 

## 2022-11-21 NOTE — Telephone Encounter (Signed)
Pt returned call x2. Returned call to patient and scheduled for 12/09/22 at 10am with Dr.Castaneda. will mail instructions once I have pre op appt. No PA needed via [facility]

## 2022-11-21 NOTE — Telephone Encounter (Signed)
Pt left voicemail returning call. Returned call but had to leave message.

## 2022-11-24 NOTE — Telephone Encounter (Signed)
Pre op appt written in on instructions. Mailed to patient.

## 2022-12-03 NOTE — Patient Instructions (Signed)
[name]  12/03/2022     @PREFPERIOPPHARMACY @   Your procedure is scheduled on 12/09/2022.   Report to [facility] at  0815 A.M.   Call this number if you have problems the morning of surgery:  [phone]  If you experience any cold or flu symptoms such as cough, fever, chills, shortness of breath, etc. between now and your scheduled surgery, please notify us at the above number.   Remember:  Follow the diet instructions given to you by the office.     Use your inhaler before you come and bring your rescue inhaler with you.     Take these medicines the morning of surgery with A SIP OF WATER        Xanax(if needed), prozac, gabapentin, claritin, mobic or tramadol (if needed), metoprolol, omeprazole.     Do not wear jewelry, make-up or nail polish, including gel polish,  artificial nails, or any other type of covering on natural nails (fingers and  toes).  Do not wear lotions, powders, or perfumes, or deodorant.  Do not shave 48 hours prior to surgery.  Men may shave face and neck.  Do not bring valuables to the hospital.  [facility] is not responsible for any belongings or valuables and must have someone with them for 24 hours.   Contacts, dentures or bridgework may not be worn into surgery.  Leave your suitcase in the car.  After surgery it may be brought to your room.  For patients admitted to the hospital, discharge time will be determined by your treatment team.  Patients discharged the day of surgery will not be allowed to drive home and must have someone with them for 24 hours.    Special instructions:   DO NOT smoke tobacco or vape for 24 hours before your procedure.  Please read over the following fact sheets that you were given. Anesthesia Post-op Instructions and Care and Recovery After Surgery      Upper Endoscopy, Adult, Care After After the procedure, it is common to have a sore throat. It is also common to have: Mild stomach pain or  discomfort. Bloating. Nausea. Follow these instructions at home: The instructions below may help you care for yourself at home. Your health care provider may give you more instructions. If you have questions, ask your health care provider. If you were given a sedative during the procedure, it can affect you for several hours. Do not drive or operate machinery until your health care provider says that it is safe. If you will be going home right after the procedure, plan to have a responsible adult: Take you home from the hospital or clinic. You will not be allowed to drive. Care for you for the time you are told. Follow instructions from your health care provider about what you may eat and drink. Return to your normal activities as told by your health care provider. Ask your health care provider what activities are safe for you. Take over-the-counter and prescription medicines only as told by your health care provider. Contact a health care provider if you: Have a sore throat that lasts longer than one day. Have trouble swallowing. Have a fever. Get help right away if you: Vomit blood or your vomit looks like coffee grounds. Have bloody, black, or tarry stools. Have a very bad sore throat or you cannot swallow. Have difficulty breathing or very bad pain in your chest or abdomen. These symptoms may be  an emergency. Get help right away. Call 911. Do not wait to see if the symptoms will go away. Do not drive yourself to the hospital. Summary After the procedure, it is common to have a sore throat, mild stomach discomfort, bloating, and nausea. If you were given a sedative during the procedure, it can affect you for several hours. Do not drive until your health care provider says that it is safe. Follow instructions from your health care provider about what you may eat and drink. Return to your normal activities as told by your health care provider. This information is not intended to replace  advice given to you by your health care provider. Make sure you discuss any questions you have with your health care provider. Document Revised: 06/12/2021 Document Reviewed: 06/12/2021 Elsevier Patient Education  2024 [employer]. Monitored Anesthesia Care, Care After The following information offers guidance on how to care for yourself after your procedure. Your health care provider may also give you more specific instructions. If you have problems or questions, contact your health care provider. What can I expect after the procedure? After the procedure, it is common to have: Tiredness. Little or no memory about what happened during or after the procedure. Impaired judgment when it comes to making decisions. Nausea or vomiting. Some trouble with balance. Follow these instructions at home: For the time period you were told by your health care provider:  Rest. Do not participate in activities where you could fall or become injured. Do not drive or use machinery. Do not drink alcohol. Do not take sleeping pills or medicines that cause drowsiness. Do not make important decisions or sign legal documents. Do not take care of children on your own. Medicines Take over-the-counter and prescription medicines only as told by your health care provider. If you were prescribed antibiotics, take them as told by your health care provider. Do not stop using the antibiotic even if you start to feel better. Eating and drinking Follow instructions from your health care provider about what you may eat and drink. Drink enough fluid to keep your urine pale yellow. If you vomit: Drink clear fluids slowly and in small amounts as you are able. Clear fluids include water, ice chips, low-calorie sports drinks, and fruit juice that has water added to it (diluted fruit juice). Eat light and bland foods in small amounts as you are able. These foods include bananas, applesauce, rice, lean meats, toast, and  crackers. General instructions  Have a responsible adult stay with you for the time you are told. It is important to have someone help care for you until you are awake and alert. If you have sleep apnea, surgery and some medicines can increase your risk for breathing problems. Follow instructions from your health care provider about wearing your sleep device: When you are sleeping. This includes during daytime naps. While taking prescription pain medicines, sleeping medicines, or medicines that make you drowsy. Do not use any products that contain nicotine or tobacco. These products include cigarettes, chewing tobacco, and vaping devices, such as e-cigarettes. If you need help quitting, ask your health care provider. Contact a health care provider if: You feel nauseous or vomit every time you eat or drink. You feel light-headed. You are still sleepy or having trouble with balance after 24 hours. You get a rash. You have a fever. You have redness or swelling around the IV site. Get help right away if: You have trouble breathing. You have new confusion after you  get home. These symptoms may be an emergency. Get help right away. Call 911. Do not wait to see if the symptoms will go away. Do not drive yourself to the hospital. This information is not intended to replace advice given to you by your health care provider. Make sure you discuss any questions you have with your health care provider. Document Revised: 07/29/2021 Document Reviewed: 07/29/2021 Elsevier Patient Education  2024 [employer].

## 2022-12-04 ENCOUNTER — Encounter (HOSPITAL_COMMUNITY)
Admission: RE | Admit: 2022-12-04 | Discharge: 2022-12-04 | Disposition: A | Discharge status: Home / Self Care | Payer: 59 | Source: Ambulatory Visit | Attending: Gastroenterology | Admitting: Gastroenterology

## 2022-12-04 ENCOUNTER — Encounter (HOSPITAL_COMMUNITY): Payer: Self-pay

## 2022-12-04 ENCOUNTER — Other Ambulatory Visit: Payer: Self-pay

## 2022-12-04 VITALS — BP 113/82 | HR 55 | Temp 97.8°F | Resp 18 | Ht 63.0 in | Wt 197.1 lb

## 2022-12-04 DIAGNOSIS — Z01812 Encounter for preprocedural laboratory examination: Secondary | ICD-10-CM | POA: Insufficient documentation

## 2022-12-04 DIAGNOSIS — K7581 Nonalcoholic steatohepatitis (NASH): Secondary | ICD-10-CM | POA: Insufficient documentation

## 2022-12-04 LAB — PROTIME-INR
INR: 1.2 (ref 0.8–1.2)
Prothrombin Time: 15.1 seconds (ref 11.4–15.2)

## 2022-12-04 LAB — CBC WITH DIFFERENTIAL/PLATELET
Abs Immature Granulocytes: 0.01 10*3/uL (ref 0.00–0.07)
Basophils Absolute: 0 10*3/uL (ref 0.0–0.1)
Basophils Relative: 1 %
Eosinophils Absolute: 0.1 10*3/uL (ref 0.0–0.5)
Eosinophils Relative: 3 %
HCT: 40 % (ref 36.0–46.0)
Hemoglobin: 13.3 g/dL (ref 12.0–15.0)
Immature Granulocytes: 0 %
Lymphocytes Relative: 29 %
Lymphs Abs: 1 10*3/uL (ref 0.7–4.0)
MCH: 31.8 pg (ref 26.0–34.0)
MCHC: 33.3 g/dL (ref 30.0–36.0)
MCV: 95.7 fL (ref 80.0–100.0)
Monocytes Absolute: 0.3 10*3/uL (ref 0.1–1.0)
Monocytes Relative: 9 %
Neutro Abs: 2.1 10*3/uL (ref 1.7–7.7)
Neutrophils Relative %: 58 %
Platelets: 60 10*3/uL — ABNORMAL LOW (ref 150–400)
RBC: 4.18 MIL/uL (ref 3.87–5.11)
RDW: 15.5 % (ref 11.5–15.5)
WBC: 3.6 10*3/uL — ABNORMAL LOW (ref 4.0–10.5)
nRBC: 0 % (ref 0.0–0.2)

## 2022-12-04 LAB — COMPREHENSIVE METABOLIC PANEL
ALT: 17 U/L (ref 0–44)
AST: 25 U/L (ref 15–41)
Albumin: 3.5 g/dL (ref 3.5–5.0)
Alkaline Phosphatase: 89 U/L (ref 38–126)
Anion gap: 10 (ref 5–15)
BUN: 22 mg/dL (ref 8–23)
CO2: 22 mmol/L (ref 22–32)
Calcium: 8.7 mg/dL — ABNORMAL LOW (ref 8.9–10.3)
Chloride: 104 mmol/L (ref 98–111)
Creatinine, Ser: 0.83 mg/dL (ref 0.44–1.00)
GFR, Estimated: >60 mL/min (ref >60)
Glucose, Bld: 98 mg/dL (ref 70–99)
Potassium: 3.8 mmol/L (ref 3.5–5.1)
Sodium: 136 mmol/L (ref 135–145)
Total Bilirubin: 0.8 mg/dL (ref 0.3–1.2)
Total Protein: 6.8 g/dL (ref 6.5–8.1)

## 2022-12-09 ENCOUNTER — Encounter (HOSPITAL_COMMUNITY)
Admission: RE | Disposition: A | Discharge status: Home / Self Care | Payer: Self-pay | Source: Home / Self Care | Attending: Gastroenterology

## 2022-12-09 ENCOUNTER — Telehealth: Payer: Self-pay

## 2022-12-09 ENCOUNTER — Ambulatory Visit (HOSPITAL_COMMUNITY)
Admission: RE | Admit: 2022-12-09 | Discharge: 2022-12-09 | Disposition: A | Discharge status: Home / Self Care | Payer: 59 | Attending: Gastroenterology | Admitting: Gastroenterology

## 2022-12-09 ENCOUNTER — Ambulatory Visit (HOSPITAL_COMMUNITY): Payer: 59 | Admitting: Anesthesiology

## 2022-12-09 ENCOUNTER — Ambulatory Visit: Payer: 59

## 2022-12-09 DIAGNOSIS — Z9049 Acquired absence of other specified parts of digestive tract: Secondary | ICD-10-CM | POA: Insufficient documentation

## 2022-12-09 DIAGNOSIS — Z538 Procedure and treatment not carried out for other reasons: Secondary | ICD-10-CM | POA: Diagnosis not present

## 2022-12-09 DIAGNOSIS — K7581 Nonalcoholic steatohepatitis (NASH): Secondary | ICD-10-CM | POA: Diagnosis not present

## 2022-12-09 DIAGNOSIS — I10 Essential (primary) hypertension: Secondary | ICD-10-CM | POA: Diagnosis not present

## 2022-12-09 DIAGNOSIS — K581 Irritable bowel syndrome with constipation: Secondary | ICD-10-CM | POA: Insufficient documentation

## 2022-12-09 DIAGNOSIS — F419 Anxiety disorder, unspecified: Secondary | ICD-10-CM | POA: Diagnosis not present

## 2022-12-09 DIAGNOSIS — K219 Gastro-esophageal reflux disease without esophagitis: Secondary | ICD-10-CM | POA: Insufficient documentation

## 2022-12-09 DIAGNOSIS — J449 Chronic obstructive pulmonary disease, unspecified: Secondary | ICD-10-CM | POA: Diagnosis not present

## 2022-12-09 DIAGNOSIS — I4892 Unspecified atrial flutter: Secondary | ICD-10-CM | POA: Insufficient documentation

## 2022-12-09 DIAGNOSIS — E039 Hypothyroidism, unspecified: Secondary | ICD-10-CM | POA: Insufficient documentation

## 2022-12-09 DIAGNOSIS — M069 Rheumatoid arthritis, unspecified: Secondary | ICD-10-CM | POA: Diagnosis not present

## 2022-12-09 DIAGNOSIS — I8500 Esophageal varices without bleeding: Secondary | ICD-10-CM | POA: Insufficient documentation

## 2022-12-09 DIAGNOSIS — I471 Supraventricular tachycardia, unspecified: Secondary | ICD-10-CM | POA: Diagnosis not present

## 2022-12-09 DIAGNOSIS — Z8619 Personal history of other infectious and parasitic diseases: Secondary | ICD-10-CM | POA: Insufficient documentation

## 2022-12-09 DIAGNOSIS — Z7722 Contact with and (suspected) exposure to environmental tobacco smoke (acute) (chronic): Secondary | ICD-10-CM | POA: Diagnosis not present

## 2022-12-09 SURGERY — ESOPHAGOGASTRODUODENOSCOPY (EGD) WITH PROPOFOL
Anesthesia: Monitor Anesthesia Care

## 2022-12-09 MED ORDER — LACTATED RINGERS IV SOLN: INTRAVENOUS | Status: DC

## 2022-12-09 NOTE — Telephone Encounter (Signed)
Dr. Diona Browner - quick question for you if you have time. This patient was seen by you in June, and presents today for her EGD with new onset atrial flutter. I'm reluctant to put her to sleep, and wonder what the best dispo is. She is heavily rate controlled, HR 40-55.  10 mins You were added by Jonelle Sidle, MD. 7 mins SM Jonelle Sidle, MD Thank you for letting me know. I looked at the ECG, it is coarse atrial fibrillation which is newly documented. She is on fairly high dose of beta-blocker for suppression of PVCs. I will add my [place] nurse to this discussion. She probably needs to have a 7-day Zio patch placed to get a better sense of her rate and rhythm, and then needs an office visit for discussion of anticoagulation candidacy.  6 mins BK Windell Norfolk, MD      7 day zio applied in office,f/u apt made.

## 2022-12-09 NOTE — Progress Notes (Signed)
Patient presents for EGD.  Monitor and EKG confirm new-onset atrial flutter.  D/W Cardiologist, Dr. Diona Browner who recommends against anesthesia today and has arranged for the patient to be seen in the [facility] cardiology office, here in the hospital.  We will be discharging her from pre-op to the cardiology clinic for care.

## 2022-12-09 NOTE — H&P (Signed)
[name] is an 72 y.o. female.   Chief Complaint: esophageal varices HPI: [name] is a 72 y.o. female with past medical history of  Hep C s/p treatment, in remission, anxiety, IBS-C, RA, COPD, GERD, opioid induced constipation, HTN, hypothyroidism, SVT, recurrent diverticulitis s/p partial colectomy and NASH with advanced fibrosis, coming for esophageal varices  Has some upper abdominal pain. The patient denies having any nausea, vomiting, fever, chills, hematochezia, melena, hematemesis, abdominal distention, diarrhea, jaundice, pruritus or weight loss.   Past Medical History:  Diagnosis Date   Anxiety    Arthritis    Bronchitis    COPD (chronic obstructive pulmonary disease) (HCC)    Depression    Diverticulitis 04/2012   [facility]   Essential hypertension    Frequent PVCs    Outflow tract, positive in the inferior leads   GERD (gastroesophageal reflux disease)    Hepatitis C    Treated   History of pneumonia    Insomnia    Migraine headache    NASH (nonalcoholic steatohepatitis)    Sleep apnea    Splenomegaly    Thyroid disease    Wears glasses     Past Surgical History:  Procedure Laterality Date   ABDOMINAL HYSTERECTOMY     ANKLE SURGERY Right    BIOPSY  10/05/2020   Procedure: BIOPSY;  Surgeon: Marguerita Merles, Reuel Boom, MD;  Location: [facility];  Service: Gastroenterology;;   COLON SURGERY     "part of color removed"   COLONOSCOPY     COLONOSCOPY WITH PROPOFOL N/A 11/13/2020   Castaneda:single non bleeding colonic angiodysplastic lesion, diverticulosis in sigmoid, non bleeding external hemorrhoids, no specimens   ESOPHAGEAL BANDING  09/16/2022   Procedure: ESOPHAGEAL BANDING;  Surgeon: Dolores Frame, MD;  Location: [facility];  Service: Gastroenterology;;   ESOPHAGEAL MANOMETRY N/A 04/10/2014   Procedure: ESOPHAGEAL MANOMETRY (EM);  Surgeon: Iva Boop, MD;  Location: [facility];  Service: Endoscopy;  Laterality:  N/A;   ESOPHAGOGASTRODUODENOSCOPY     ESOPHAGOGASTRODUODENOSCOPY (EGD) WITH PROPOFOL N/A 10/05/2020   Castaneda: barrett's esophagus, short segment, 1cm hh, normal stomach, normal duodenum, biopsied, normal, no varices   ESOPHAGOGASTRODUODENOSCOPY (EGD) WITH PROPOFOL N/A 09/05/2022   Procedure: ESOPHAGOGASTRODUODENOSCOPY (EGD) WITH PROPOFOL;  Surgeon: Dolores Frame, MD;  [facility];  Service: Gastroenterology;  Laterality: N/A;  2:00PM;ASA 3   ESOPHAGOGASTRODUODENOSCOPY (EGD) WITH PROPOFOL N/A 09/16/2022   Procedure: ESOPHAGOGASTRODUODENOSCOPY (EGD) WITH PROPOFOL;  Surgeon: Dolores Frame, MD;  Location: [facility];  Service: Gastroenterology;  Laterality: N/A;  10:00am;asa 3   INCISIONAL HERNIA REPAIR N/A 10/24/2015   Procedure: LAPAROSCOPIC REPAIR INCISIONAL HERNIA WITH MESH;  Surgeon: Violeta Gelinas, MD;  Location: [facility] OR;  Service: General;  Laterality: N/A;   INSERTION OF MESH N/A 10/24/2015   Procedure: INSERTION OF MESH;  Surgeon: Violeta Gelinas, MD;  Location: [facility] OR;  Service: General;  Laterality: N/A;   JOINT REPLACEMENT     KNEE SURGERY Right    x4   PH IMPEDANCE STUDY N/A 04/10/2014   Procedure: PH IMPEDANCE STUDY;  Surgeon: Iva Boop, MD;  Location: [facility];  Service: Endoscopy;  Laterality: N/A;   TOTAL SHOULDER ARTHROPLASTY Left 04/22/2018   Procedure: Left total shoulder arthroplasty;  Surgeon: Francena Hanly, MD;  Location: [facility];  Service: Orthopedics;  Laterality: Left;     Family History  Problem Relation Age of Onset   Alcohol abuse Father    Dementia Father  Dementia Maternal Grandmother    Bipolar disorder Daughter    Anxiety disorder Daughter    Depression Brother    Drug abuse Brother    Alcohol abuse Paternal Uncle    Depression Paternal Uncle    ADD / ADHD Neg Hx    Social History:  reports that she has never smoked. She has been exposed to tobacco smoke. She has never used smokeless tobacco. She  reports current alcohol use of about 3.0 standard drinks of alcohol per week. She reports that she does not use drugs.  Allergies:  Allergies  Allergen Reactions   Acetaminophen Other (See Comments)    Liver function per MD  Other Reaction(s): Not available   Levothyroxine Hives and Itching   Nabumetone Rash    Medications Prior to Admission  Medication Sig Dispense Refill   albuterol (PROVENTIL HFA;VENTOLIN HFA) 108 (90 Base) MCG/ACT inhaler Inhale 2 puffs into the lungs every 6 (six) hours as needed for wheezing or shortness of breath.     ALPRAZolam (XANAX) 1 MG tablet Take 1 tablet (1 mg total) by mouth 4 (four) times daily. 120 tablet 3   amitriptyline (ELAVIL) 10 MG tablet Take 1 tablet (10 mg total) by mouth at bedtime. 90 tablet 3   Ascorbic Acid (VITAMIN C) 1000 MG tablet Take 1,000 mg by mouth daily.     aspirin EC 81 MG tablet Take 81 mg by mouth daily.     cyanocobalamin 1000 MCG tablet Take 2,000 mcg by mouth daily.     ELDERBERRY PO Take 1 each by mouth daily.     FLUoxetine (PROZAC) 40 MG capsule Take 1 capsule (40 mg total) by mouth daily. 30 capsule 3   Fluticasone-Umeclidin-Vilant 100-62.5-25 MCG/INH AEPB Inhale 1 puff into the lungs daily as needed (asthma).     gabapentin (NEURONTIN) 300 MG capsule Take 300 mg by mouth 3 (three) times daily.     hyoscyamine (LEVSIN) 0.125 MG tablet Take 1 tablet (0.125 mg total) by mouth every 8 (eight) hours as needed (abdominal pain). 30 tablet 2   linaclotide (LINZESS) 290 MCG CAPS capsule TAKE ONE CAPSULE BY MOUTH ONCE DAILY BEFORE BREAKFAST. 90 capsule 0   loratadine (CLARITIN) 10 MG tablet Take 1 tablet by mouth daily.     meloxicam (MOBIC) 15 MG tablet Take 1 tablet (15 mg total) by mouth daily. 30 tablet 1   metoCLOPramide (REGLAN) 10 MG tablet Take 10 mg by mouth every 6 (six) hours as needed for nausea or vomiting.     metoprolol succinate (TOPROL-XL) 100 MG 24 hr tablet TAKE ONE TABLET BY MOUTH TWICE DAILY WITH OR  IMMEDIATELY FOLLOWING A MEAL. 180 tablet 1   Multiple Vitamin (MULTI-VITAMIN PO) Take 1 tablet by mouth daily.     nystatin (MYCOSTATIN) powder Apply 1 g topically daily.  2   omeprazole (PRILOSEC) 40 MG capsule TAKE 1 CAPSULE BY MOUTH TWICE DAILY 120 capsule 5   ondansetron (ZOFRAN) 4 MG tablet Take 1 tablet by mouth every 8 (eight) hours as needed for nausea or vomiting.     phenazopyridine (PYRIDIUM) 200 MG tablet Take 1 tablet by mouth every 8 (eight) hours.     polyethylene glycol (MIRALAX) 17 g packet Take 17 g by mouth daily as needed for mild constipation or moderate constipation.     potassium chloride (KLOR-CON) 10 MEQ tablet Take 10 mEq by mouth as needed (LEG CRAMPS).     predniSONE (DELTASONE) 10 MG tablet Take 10 mg by mouth daily  with breakfast.     rosuvastatin (CRESTOR) 10 MG tablet Take 1 tablet (10 mg total) by mouth daily. 90 tablet 1   Simethicone (GAS RELIEF PO) Take 2 capsules by mouth as needed (flatulence).      temazepam (RESTORIL) 30 MG capsule Take 1 capsule (30 mg total) by mouth at bedtime as needed. for sleep 30 capsule 3   traMADol (ULTRAM) 50 MG tablet Take 50 mg by mouth 3 (three) times daily as needed for severe pain.      No results found for this or any previous visit (from the past 48 hour(s)). No results found.  Review of Systems  All other systems reviewed and are negative.   Blood pressure (!) 150/70, pulse (!) 54, temperature (!) 97.4 F (36.3 C), temperature source Oral, resp. rate 16, height 5\' 3"  (1.6 m), weight 89.4 kg, SpO2 98%. Physical Exam  GENERAL: The patient is AO x3, in no acute distress. HEENT: Head is normocephalic and atraumatic. EOMI are intact. Mouth is well hydrated and without lesions. NECK: Supple. No masses LUNGS: Clear to auscultation. No presence of rhonchi/wheezing/rales. Adequate chest expansion HEART: RRR, normal s1 and s2. ABDOMEN: Soft, nontender, no guarding, no peritoneal signs, and nondistended. BS +. No  masses. EXTREMITIES: Without any cyanosis, clubbing, rash, lesions or edema. NEUROLOGIC: AOx3, no focal motor deficit. SKIN: no jaundice, no rashes  Assessment/Plan  [name] is a 72 y.o. female with past medical history of  Hep C s/p treatment, in remission, anxiety, IBS-C, RA, COPD, GERD, opioid induced constipation, HTN, hypothyroidism, SVT, recurrent diverticulitis s/p partial colectomy and NASH with advanced fibrosis, coming for esophageal varices. We will proceed with EGD.  Dolores Frame, MD 12/09/2022, 8:39 AM

## 2022-12-09 NOTE — Addendum Note (Signed)
Addended by: Kerney Elbe on: 12/09/2022 09:48 AM   Modules accepted: Orders

## 2022-12-09 NOTE — Progress Notes (Signed)
Procedure canceled as patient presented new onset atrial flutter, she will be seen in the cardiology clinic and will need to obtain clearance before rescheduling.

## 2022-12-22 DIAGNOSIS — I4892 Unspecified atrial flutter: Secondary | ICD-10-CM | POA: Diagnosis not present

## 2022-12-30 ENCOUNTER — Ambulatory Visit: Payer: 59 | Attending: Cardiology | Admitting: Cardiology

## 2022-12-30 ENCOUNTER — Encounter: Payer: Self-pay | Admitting: Cardiology

## 2022-12-30 VITALS — BP 118/70 | HR 52 | Ht 63.0 in | Wt 193.4 lb

## 2022-12-30 DIAGNOSIS — R0609 Other forms of dyspnea: Secondary | ICD-10-CM

## 2022-12-30 DIAGNOSIS — I4891 Unspecified atrial fibrillation: Secondary | ICD-10-CM

## 2022-12-30 DIAGNOSIS — I10 Essential (primary) hypertension: Secondary | ICD-10-CM | POA: Diagnosis not present

## 2022-12-30 DIAGNOSIS — I493 Ventricular premature depolarization: Secondary | ICD-10-CM

## 2022-12-30 DIAGNOSIS — I4892 Unspecified atrial flutter: Secondary | ICD-10-CM

## 2022-12-30 DIAGNOSIS — Z79899 Other long term (current) drug therapy: Secondary | ICD-10-CM

## 2022-12-30 DIAGNOSIS — E782 Mixed hyperlipidemia: Secondary | ICD-10-CM

## 2022-12-30 DIAGNOSIS — R0602 Shortness of breath: Secondary | ICD-10-CM | POA: Diagnosis not present

## 2022-12-30 MED ORDER — APIXABAN 5 MG PO TABS: 5.0000 mg | ORAL_TABLET | Freq: Two times a day (BID) | ORAL | 6 refills | Status: DC

## 2022-12-30 MED ORDER — APIXABAN 5 MG PO TABS: 5.0000 mg | ORAL_TABLET | Freq: Two times a day (BID) | ORAL | 0 refills | Status: DC

## 2022-12-30 NOTE — Patient Instructions (Addendum)
Medication Instructions:   Begin Eliquis 5mg  twice a day   Stop Aspirin  Continue all other medications.     Labwork:  CBC - order given Please do in 1 week Office will contact with results via phone, letter or mychart.     Testing/Procedures:  none  Follow-Up:  4 weeks   Any Other Special Instructions Will Be Listed Below (If Applicable).   If you need a refill on your cardiac medications before your next appointment, please call your pharmacy.

## 2022-12-30 NOTE — Progress Notes (Signed)
Cardiology Office Note:  .   Date:  12/30/2022  ID:  [name], DOB Aug 03, 1950, MRN [id number] PCP: Monique Specking, MD  [facility] Providers Cardiologist:  Monique Dell, MD    History of Present Illness: .   [name] is a 72 y.o. female with a past medical history of hypertension, GERD, IBS, Barrett's esophagus, depression/anxiety, morbid obesity, hepatitis C status posttreatment in remission, COPD, hypothyroidism, opioid-induced constipation, esophageal varices, history of frequent PVCs, and concerns for new onset atrial flutter, who is here today for follow-up.  She was last seen in clinic 08/27/2022 by Dr. Diona Browner.  At that time EKG revealed sinus rhythm with ventricular bigeminy and poor R wave progression.  She was encouraged to continue with Toprol-XL.  For pending EGD that was upcoming she was advised that she could hold aspirin if necessary.  There were no further medication changes made and no testing that was ordered.  Patient presented to the facility for scheduled EGD.  Monitoring EKG confirmed new onset atrial flutter.  They discussed with the patient's primary cardiologist who arranged for the patient to be seen in clinic.  She was discharged from the procedure area and the procedure can be rescheduled after follow-up with cardiology.  Patient was placed on a ZIO monitor that confirmed coarse atrial fibrillation/flutter with an average heart rate in the 50s.  She returns to clinic today accompanied by her daughter.  She has complaints of worsening fatigue, shortness of breath, and sleepiness.  She denies any chest pain, palpitations, lightheadedness or dizziness, or peripheral edema.  She states that she has restarted taking her aspirin again as she had previously stopped her aspirin therapy.  She recently had esophageal banding done in July.  And she has upcoming EGD that will need to be scheduled but will require cardiac clearance again.  Previous EGD had to be  canceled for new onset atrial fibrillation.  She will also require cardiac clearance prior to being scheduled for an additional EGD.  Unfortunately her monitor confirmed she is not 100% of atrial fibrillation at this time.  She denies any bleeding noted blood in her stool or urine, black tarry stools, or vomiting.  She denies any hospitalizations or emergency department visits.  ROS: 10 point review of systems has been reviewed and considered negative with exception of what is been listed in the HPI  Studies Reviewed: Marland Kitchen   EKG Interpretation Date/Time:  Tuesday December 30 2022 14:24:16 EDT Ventricular Rate:  69 PR Interval:    QRS Duration:  70 QT Interval:  400 QTC Calculation: 428 R Axis:   19  Text Interpretation: Atrial flutter with variable A-V block Low voltage QRS Septal infarct (cited on or before 09-May-2020) When compared with ECG of 09-Dec-2022 08:42, Confirmed by Charlsie Quest ([id number]) on 12/30/2022 2:28:04 PM   Zio Monitor 12/23/22 Predominant rhythm is coarse atrial fibrillation/flutter with heart rate ranging from 38 bpm up to 146 bpm and average heart rate 58 bpm. Frequent PVCs were noted representing 19.8% total beats with otherwise rare ventricular couplets and triplets and limited episodes of ventricular bigeminy and trigeminy.  Single, 6 beat burst of NSVT was noted.  There were no sustained arrhythmias. No pauses or high degree heart block.  Cardiac monitor June 2023: ZIO XT reviewed 1.  Predominant rhythm is sinus with heart rate ranging from 65 bpm up to 98 bpm and average heart rate 74 bpm. 2.  There were rare PACs including atrial couplets and triplets  representing less than 1% total beats. 3.  There were frequent PVCs representing approximately 20% total beats.  Otherwise rare ventricular couplets and triplets representing less than 1% total beats.  Also episodes of ventricular bigeminy and trigeminy. 4.  No sustained arrhythmias or pauses.   Echocardiogram  08/19/2021:  1. Left ventricular ejection fraction, by estimation, is 60 to 65%. The  left ventricle has normal function. The left ventricle has no regional  wall motion abnormalities. Left ventricular diastolic parameters are  consistent with Grade I diastolic  dysfunction (impaired relaxation). Elevated left atrial pressure. The  average left ventricular global longitudinal strain is -18.8 %. The global  longitudinal strain is normal.   2. Right ventricular systolic function is normal. The right ventricular  size is normal. Tricuspid regurgitation signal is inadequate for assessing  PA pressure.   3. The mitral valve is abnormal. Mild mitral valve regurgitation. No  evidence of mitral stenosis.   4. The aortic valve is tricuspid. There is mild calcification of the  aortic valve. There is mild thickening of the aortic valve. Aortic valve  regurgitation is not visualized. No aortic stenosis is present.   5. The inferior vena cava is normal in size with greater than 50%  respiratory variability, suggesting right atrial pressure of 3 mmHg. Risk Assessment/Calculations:    CHA2DS2-VASc Score = 3   This indicates a 3.2% annual risk of stroke. The patient's score is based upon: CHF History: 0 HTN History: 1 Diabetes History: 0 Stroke History: 0 Vascular Disease History: 0 Age Score: 1 Gender Score: 1            Physical Exam:   VS:  BP 118/70   Pulse (!) 52   Ht 5\' 3"  (1.6 m)   Wt 193 lb 6.4 oz (87.7 kg)   SpO2 97%   BMI 34.26 kg/m    Wt Readings from Last 3 Encounters:  12/30/22 193 lb 6.4 oz (87.7 kg)  12/09/22 197 lb 1.5 oz (89.4 kg)  12/04/22 197 lb 1.5 oz (89.4 kg)    GEN: Well nourished, well developed in no acute distress NECK: No JVD; No carotid bruits CARDIAC: IR IR, no murmurs, rubs, gallops RESPIRATORY:  Clear to auscultation without rales, wheezing or rhonchi  ABDOMEN: Soft, non-tender, non-distended EXTREMITIES: Trace pretibial edema; No deformity    ASSESSMENT AND PLAN: .   New onset atrial fibrillation/atrial flutter which she was recently found during EGD and ZIO XT monitor worn for 7 days confirmed she is in atrial fibrillation/atrial flutter.  EKG today reveals that she is in atrial flutter with varying AV block.  She is rate controlled as she is continued on beta-blocker therapy of metoprolol succinate 100 mg daily.  We are starting apixaban 5 mg twice daily for CHA2DS2-VASc score of at least 3 for stroke prophylaxis and discontinued her aspirin therapy.  We have reached out to GI who is okay with her starting oral anticoagulant therapy for short period of time.  We have also reached out to DOD Dr. Wyline Mood who is in agreements with starting apixaban and discontinuing aspirin therapy.  She is scheduled for CBC in 1 week.  She and her daughters both been explained that she will need to have an interrupted anticoagulation for minimum of 3 to 4 weeks and if she remains in atrial fibrillation atrial flutter she can be scheduled for DCCV.  With her increased risk of bleeding she return to the office to determine that she 1 is still in atrial  fibrillation/atrial flutter has tolerated the medications, prior to being scheduled for DCCV as she has a high risk of bleeding.  Shortness of breath/DOE that has gradually progressed over the last several weeks.  Hemoglobin 12/04/2022 was 13.3.  She has been scheduled for an echocardiogram with her new onset atrial fibrillation as well as scheduled for CBC in 1 week to reevaluate hemoglobin after starting OAC.  Hypertension with a blood pressure today of 118/70.  She has been continued on her Toprol-XL.  She has also been encouraged to monitor pressure 1 to 2 hours postmedication administration.  History of frequent PVCs longstanding history of PVCs that have been managed on high-dose Toprol-XL.  Prior echocardiogram revealed LVEF of 60-65% in June 2023.  RV contraction was normal.  Mixed hyperlipidemia with an  LDL of 59.  She is continued on rosuvastatin 10 mg nightly.  This continues to be managed by her PCP.  Obesity with a BMI of 34.26.  She is encouraged to increase her activity as tolerated and decreasing caloric intake as well.  That would be beneficial to her shortness of breath and fatigue is likely multifactorial from deconditioning.       Dispo: Patient to return to clinic to see MD/APP in 4 weeks or sooner if needed for reevaluation of symptoms.  Signed, Danaysia Rader, NP

## 2023-01-08 DIAGNOSIS — I4891 Unspecified atrial fibrillation: Secondary | ICD-10-CM | POA: Diagnosis not present

## 2023-01-08 DIAGNOSIS — Z79899 Other long term (current) drug therapy: Secondary | ICD-10-CM | POA: Diagnosis not present

## 2023-01-09 LAB — CBC
HCT: 41.1 % (ref 35.0–45.0)
Hemoglobin: 14 g/dL (ref 11.7–15.5)
MCH: 32.1 pg (ref 27.0–33.0)
MCHC: 34.1 g/dL (ref 32.0–36.0)
MCV: 94.3 fL (ref 80.0–100.0)
Platelets: 71 10*3/uL — ABNORMAL LOW (ref 140–400)
RBC: 4.36 10*6/uL (ref 3.80–5.10)
RDW: 16.2 % — ABNORMAL HIGH (ref 11.0–15.0)
WBC: 4.8 10*3/uL (ref 3.8–10.8)

## 2023-01-09 NOTE — Progress Notes (Signed)
Blood counts have remained stable and platelet count has improved slightly since being off of aspirin.Continue current medication regimen without changes at this time.

## 2023-01-22 ENCOUNTER — Telehealth (HOSPITAL_COMMUNITY): Payer: 59 | Admitting: Psychiatry

## 2023-01-22 ENCOUNTER — Encounter (HOSPITAL_COMMUNITY): Payer: Self-pay | Admitting: Psychiatry

## 2023-01-22 DIAGNOSIS — F331 Major depressive disorder, recurrent, moderate: Secondary | ICD-10-CM

## 2023-01-22 DIAGNOSIS — R109 Unspecified abdominal pain: Secondary | ICD-10-CM

## 2023-01-22 MED ORDER — ALPRAZOLAM 1 MG PO TABS: 1.0000 mg | ORAL_TABLET | Freq: Four times a day (QID) | ORAL | 3 refills | Status: DC

## 2023-01-22 MED ORDER — AMITRIPTYLINE HCL 10 MG PO TABS
10.0000 mg | ORAL_TABLET | Freq: Every day | ORAL | 3 refills | Status: DC
Start: 2023-01-22 — End: 2023-07-03

## 2023-01-22 MED ORDER — TEMAZEPAM 30 MG PO CAPS: 30.0000 mg | ORAL_CAPSULE | Freq: Every evening | ORAL | 3 refills | Status: DC | PRN

## 2023-01-22 MED ORDER — FLUOXETINE HCL 40 MG PO CAPS: 40.0000 mg | ORAL_CAPSULE | Freq: Every day | ORAL | 3 refills | Status: DC

## 2023-01-22 NOTE — Progress Notes (Signed)
Virtual Visit via Telephone Note  I connected with [name] on 01/22/23 at  1:00 PM EST by telephone and verified that I am speaking with the correct person using two identifiers.  Location: Patient: home Provider: office   I discussed the limitations, risks, security and privacy concerns of performing an evaluation and management service by telephone and the availability of in person appointments. I also discussed with the patient that there may be a patient responsible charge related to this service. The patient expressed understanding and agreed to proceed.      I discussed the assessment and treatment plan with the patient. The patient was provided an opportunity to ask questions and all were answered. The patient agreed with the plan and demonstrated an understanding of the instructions.   The patient was advised to call back or seek an in-person evaluation if the symptoms worsen or if the condition fails to improve as anticipated.  I provided 20 minutes of non-face-to-face time during this encounter.   Diannia Ruder, MD  [facility] MD/PA/NP OP Progress Note  01/22/2023 1:15 PM [name]  MRN:  [id number]  Chief Complaint:  Chief Complaint  Patient presents with   Depression   Anxiety   Follow-up   HPI: This patient is a 72 year old widowed white female who lives with her daughter in [place].  She is on disability.  The patient returns for follow-up after 4 months regarding her depression and anxiety.  Unfortunately she tells me that her 74 year old nephew was found unresponsive last Saturday.  No one knows what exactly happened and an autopsy is going to be held.  She is obviously very upset about this.  This was her brother's child.  She is still dealing with a lot of health issues such as atrial fibrillation and flutter as well as having the esophageal varices from [place].  Nevertheless she states her mood is fairly stable but overall feels like her medications are helpful  usually she wakes up through the night but is usually able to get back to sleep.  She denies any thoughts of self-harm or suicide. Visit Diagnosis:    ICD-10-CM   1. Major depressive disorder, recurrent episode, moderate (HCC)  F33.1     2. Functional abdominal pain syndrome  R10.9 amitriptyline (ELAVIL) 10 MG tablet      Past Psychiatric History: none  Past Medical History:  Past Medical History:  Diagnosis Date   Anxiety    Arthritis    Bronchitis    COPD (chronic obstructive pulmonary disease) (HCC)    Depression    Diverticulitis 04/2012   [facility]   Essential hypertension    Frequent PVCs    Outflow tract, positive in the inferior leads   GERD (gastroesophageal reflux disease)    Hepatitis C    Treated   History of pneumonia    Insomnia    Migraine headache    NASH (nonalcoholic steatohepatitis)    Sleep apnea    Splenomegaly    Thyroid disease    Wears glasses     Past Surgical History:  Procedure Laterality Date   ABDOMINAL HYSTERECTOMY     ANKLE SURGERY Right    BIOPSY  10/05/2020   Procedure: BIOPSY;  Surgeon: Marguerita Merles, Reuel Boom, MD;  [facility];  Service: Gastroenterology;;   COLON SURGERY     "part of color removed"   COLONOSCOPY     COLONOSCOPY WITH PROPOFOL N/A 11/13/2020   Castaneda:single non bleeding colonic angiodysplastic  lesion, diverticulosis in sigmoid, non bleeding external hemorrhoids, no specimens   ESOPHAGEAL BANDING  09/16/2022   Procedure: ESOPHAGEAL BANDING;  Surgeon: Dolores Frame, MD;  [facility];  Service: Gastroenterology;;   ESOPHAGEAL MANOMETRY N/A 04/10/2014   Procedure: ESOPHAGEAL MANOMETRY (EM);  Surgeon: Iva Boop, MD;  [facility]: [facility];  Service: Endoscopy;  Laterality: N/A;   ESOPHAGOGASTRODUODENOSCOPY     ESOPHAGOGASTRODUODENOSCOPY (EGD) WITH PROPOFOL N/A 10/05/2020   Castaneda: barrett's esophagus, short segment, 1cm hh, normal stomach, normal duodenum,  biopsied, normal, no varices   ESOPHAGOGASTRODUODENOSCOPY (EGD) WITH PROPOFOL N/A 09/05/2022   Procedure: ESOPHAGOGASTRODUODENOSCOPY (EGD) WITH PROPOFOL;  Surgeon: Dolores Frame, MD;  [facility];  Service: Gastroenterology;  Laterality: N/A;  2:00PM;ASA 3   ESOPHAGOGASTRODUODENOSCOPY (EGD) WITH PROPOFOL N/A 09/16/2022   Procedure: ESOPHAGOGASTRODUODENOSCOPY (EGD) WITH PROPOFOL;  Surgeon: Dolores Frame, MD;  [facility];  Service: Gastroenterology;  Laterality: N/A;  10:00am;asa 3   INCISIONAL HERNIA REPAIR N/A 10/24/2015   Procedure: LAPAROSCOPIC REPAIR INCISIONAL HERNIA WITH MESH;  Surgeon: Violeta Gelinas, MD;  Location[facility] OR;  Service: General;  Laterality: N/A;   INSERTION OF MESH N/A 10/24/2015   Procedure: INSERTION OF MESH;  Surgeon: Violeta Gelinas, MD;  Location: [facility] OR;  Service: General;  Laterality: N/A;   JOINT REPLACEMENT     KNEE SURGERY Right    x4   PH IMPEDANCE STUDY N/A 04/10/2014   Procedure: PH IMPEDANCE STUDY;  Surgeon: Iva Boop, MD;  [facility];  Service: Endoscopy;  Laterality: N/A;   TOTAL SHOULDER ARTHROPLASTY Left 04/22/2018   Procedure: Left total shoulder arthroplasty;  Surgeon: Francena Hanly, MD;  Location: [facility];  Service: Orthopedics;  Laterality: Left;     Family Psychiatric History: See below  Family History:  Family History  Problem Relation Age of Onset   Alcohol abuse Father    Dementia Father    Dementia Maternal Grandmother    Bipolar disorder Daughter    Anxiety disorder Daughter    Depression Brother    Drug abuse Brother    Alcohol abuse Paternal Uncle    Depression Paternal Uncle    ADD / ADHD Neg Hx     Social History:  Social History   Socioeconomic History   Marital status: Widowed    Spouse name: Not on file   Number of children: 2   Years of education: 12th grade   Highest education level: Not on file  Occupational History   Occupation: RETIRED  Tobacco  Use   Smoking status: Never    Passive exposure: Current   Smokeless tobacco: Never  Vaping Use   Vaping status: Never Used  Substance and Sexual Activity   Alcohol use: Yes    Alcohol/week: 3.0 standard drinks of alcohol    Types: 3 Cans of beer per week    Comment: occasionally   Drug use: No   Sexual activity: Never  Other Topics Concern   Not on file  Social History Narrative   Not on file   Social Determinants of Health   Financial Resource Strain: Not on file  Food Insecurity: Not on file  Transportation Needs: Not on file  Physical Activity: Inactive (10/11/2019)   Received from [facility], [facility]   Exercise Vital Sign    Days of Exercise per Week: 0 days    Minutes of Exercise per Session: 0 min  Stress: Not on file  Social Connections: Not on file  Allergies:  Allergies  Allergen Reactions   Acetaminophen Other (See Comments)    Liver function per MD  Other Reaction(s): Not available   Levothyroxine Hives and Itching   Nabumetone Rash    Metabolic Disorder Labs: No results found for: "HGBA1C", "MPG" No results found for: "PROLACTIN" No results found for: "CHOL", "TRIG", "HDL", "CHOLHDL", "VLDL", "LDLCALC" Lab Results  Component Value Date   TSH 3.26 10/17/2019   TSH 4.540 (H) 10/28/2007    Therapeutic Level Labs: No results found for: "LITHIUM" No results found for: "VALPROATE" No results found for: "CBMZ"  Current Medications: Current Outpatient Medications  Medication Sig Dispense Refill   albuterol (PROVENTIL HFA;VENTOLIN HFA) 108 (90 Base) MCG/ACT inhaler Inhale 2 puffs into the lungs every 6 (six) hours as needed for wheezing or shortness of breath.     ALPRAZolam (XANAX) 1 MG tablet Take 1 tablet (1 mg total) by mouth 4 (four) times daily. 120 tablet 3   amitriptyline (ELAVIL) 10 MG tablet Take 1 tablet (10 mg total) by mouth at bedtime. 90 tablet 3   apixaban (ELIQUIS) 5 MG TABS tablet Take 1 tablet (5 mg total) by mouth 2  (two) times daily. 60 tablet 6   Ascorbic Acid (VITAMIN C) 1000 MG tablet Take 1,000 mg by mouth daily.     cyanocobalamin 1000 MCG tablet Take 2,000 mcg by mouth daily.     ELDERBERRY PO Take 1 each by mouth daily.     FLUoxetine (PROZAC) 40 MG capsule Take 1 capsule (40 mg total) by mouth daily. 30 capsule 3   Fluticasone-Umeclidin-Vilant 100-62.5-25 MCG/INH AEPB Inhale 1 puff into the lungs daily as needed (asthma).     gabapentin (NEURONTIN) 300 MG capsule Take 300 mg by mouth 3 (three) times daily.     hyoscyamine (LEVSIN) 0.125 MG tablet Take 1 tablet (0.125 mg total) by mouth every 8 (eight) hours as needed (abdominal pain). 30 tablet 2   linaclotide (LINZESS) 290 MCG CAPS capsule TAKE ONE CAPSULE BY MOUTH ONCE DAILY BEFORE BREAKFAST. (Patient not taking: Reported on 12/30/2022) 90 capsule 0   loratadine (CLARITIN) 10 MG tablet Take 1 tablet by mouth daily.     meloxicam (MOBIC) 15 MG tablet Take 1 tablet (15 mg total) by mouth daily. 30 tablet 1   metoCLOPramide (REGLAN) 10 MG tablet Take 10 mg by mouth every 6 (six) hours as needed for nausea or vomiting.     metoprolol succinate (TOPROL-XL) 100 MG 24 hr tablet TAKE ONE TABLET BY MOUTH TWICE DAILY WITH OR IMMEDIATELY FOLLOWING A MEAL. 180 tablet 1   Multiple Vitamin (MULTI-VITAMIN PO) Take 1 tablet by mouth daily.     nystatin (MYCOSTATIN) powder Apply 1 g topically daily.  2   omeprazole (PRILOSEC) 40 MG capsule TAKE 1 CAPSULE BY MOUTH TWICE DAILY 120 capsule 5   ondansetron (ZOFRAN) 4 MG tablet Take 1 tablet by mouth every 8 (eight) hours as needed for nausea or vomiting.     phenazopyridine (PYRIDIUM) 200 MG tablet Take 1 tablet by mouth every 8 (eight) hours.     polyethylene glycol (MIRALAX) 17 g packet Take 17 g by mouth daily as needed for mild constipation or moderate constipation.     potassium chloride (KLOR-CON) 10 MEQ tablet Take 10 mEq by mouth as needed (LEG CRAMPS).     rosuvastatin (CRESTOR) 10 MG tablet Take 1 tablet (10  mg total) by mouth daily. 90 tablet 1   Simethicone (GAS RELIEF PO) Take 2 capsules by mouth  as needed (flatulence).      temazepam (RESTORIL) 30 MG capsule Take 1 capsule (30 mg total) by mouth at bedtime as needed. for sleep 30 capsule 3   traMADol (ULTRAM) 50 MG tablet Take 50 mg by mouth 3 (three) times daily as needed for severe pain.     No current facility-administered medications for this visit.     Musculoskeletal: Strength & Muscle Tone: na Gait & Station: na Patient leans: N/A  Psychiatric Specialty Exam: Review of Systems  Constitutional:  Positive for appetite change and fatigue.  Psychiatric/Behavioral:  Positive for dysphoric mood.   All other systems reviewed and are negative.   There were no vitals taken for this visit.There is no height or weight on file to calculate BMI.  General Appearance: NA  Eye Contact:  NA  Speech:  Clear and Coherent  Volume:  Decreased  Mood:  Dysphoric  Affect:  NA  Thought Process:  Goal Directed  Orientation:  Full (Time, Place, and Person)  Thought Content: Rumination   Suicidal Thoughts:  No  Homicidal Thoughts:  No  Memory:  Immediate;   Good Recent;   Good Remote;   Good  Judgement:  Good  Insight:  Fair  Psychomotor Activity:  Decreased  Concentration:  Concentration: Good and Attention Span: Good  Recall:  Good  Fund of Knowledge: Good  Language: Good  Akathisia:  No  Handed:  Right  AIMS (if indicated): not done  Assets: Communication, social support  ADL's:  Intact  Cognition: WNL  Sleep:  Fair   Screenings: PHQ2-9    Flowsheet Row Video Visit from 11/14/2021 in [facility] [place] Video Visit from 07/29/2021 in [facility] at [place] Video Visit from 05/07/2021 in [facility] at [place] Video Visit from 12/31/2020 in [facility] at [place] Video Visit from 08/27/2020 in [facility] at [facility] Total Score 0 1 1 0 1      Flowsheet Row Admission (Discharged) from 12/09/2022 in [facility] Pre-Admission Testing 60 from 12/04/2022 in [facility] Admission (Discharged) from 09/16/2022 in [place] [facility]  C-SSRS RISK CATEGORY No Risk No Risk No Risk        Assessment and Plan: This patient is a 72 year old female with a history of depression and anxiety.  She does feel that her medications are still helpful for these conditions.  She will continue Prozac 40 mg daily for depression, Xanax 1 mg 4 times daily for anxiety, Restoril 30 mg at bedtime for sleep and amitriptyline 10 mg at bedtime for abdominal pain and sleep.  She will return to see me in 4 months  Collaboration of Care: Collaboration of Care: Other provider involved in patient's care AEB notes are shared with GI and cardiology on the epic system  Patient/Guardian was advised Release of Information must be obtained prior to any record release in order to collaborate their care with an outside provider. Patient/Guardian was advised if they have not already done so to contact the registration department to sign all necessary forms in order for [place] to release information regarding their care.   Consent: Patient/Guardian gives verbal consent for treatment and assignment of benefits for services provided during this visit. Patient/Guardian expressed understanding and agreed to proceed.    Diannia Ruder, MD 01/22/2023, 1:15 PM

## 2023-01-23 ENCOUNTER — Ambulatory Visit (HOSPITAL_COMMUNITY): Payer: 59

## 2023-01-28 ENCOUNTER — Ambulatory Visit (HOSPITAL_COMMUNITY)
Admission: RE | Admit: 2023-01-28 | Discharge: 2023-01-28 | Disposition: A | Discharge status: Home / Self Care | Payer: 59 | Source: Ambulatory Visit | Attending: Cardiology | Admitting: Cardiology

## 2023-01-28 DIAGNOSIS — R0609 Other forms of dyspnea: Secondary | ICD-10-CM | POA: Insufficient documentation

## 2023-01-28 DIAGNOSIS — R0602 Shortness of breath: Secondary | ICD-10-CM | POA: Insufficient documentation

## 2023-01-28 LAB — ECHOCARDIOGRAM COMPLETE
Area-P 1/2: 3.65 cm2
S' Lateral: 2.9 cm

## 2023-01-28 NOTE — Progress Notes (Signed)
*  PRELIMINARY RESULTS* Echocardiogram 2D Echocardiogram has been performed.  Stacey Drain 01/28/2023, 3:08 PM

## 2023-01-29 NOTE — Progress Notes (Signed)
Heart squeeze of 65 to 70% with normal wall motion.  Trivial leakage was noted in the mitral valve.  No other valvular abnormalities was noted.  Overall reassuring study with no significant change from prior study.

## 2023-01-30 ENCOUNTER — Ambulatory Visit: Payer: 59 | Attending: Nurse Practitioner | Admitting: Nurse Practitioner

## 2023-01-30 ENCOUNTER — Encounter: Payer: Self-pay | Admitting: Nurse Practitioner

## 2023-01-30 ENCOUNTER — Encounter: Payer: Self-pay | Admitting: *Deleted

## 2023-01-30 VITALS — BP 115/62 | HR 61 | Ht 63.0 in | Wt 193.4 lb

## 2023-01-30 DIAGNOSIS — J449 Chronic obstructive pulmonary disease, unspecified: Secondary | ICD-10-CM | POA: Diagnosis not present

## 2023-01-30 DIAGNOSIS — I4892 Unspecified atrial flutter: Secondary | ICD-10-CM

## 2023-01-30 DIAGNOSIS — R079 Chest pain, unspecified: Secondary | ICD-10-CM

## 2023-01-30 DIAGNOSIS — R296 Repeated falls: Secondary | ICD-10-CM | POA: Diagnosis not present

## 2023-01-30 DIAGNOSIS — I10 Essential (primary) hypertension: Secondary | ICD-10-CM

## 2023-01-30 DIAGNOSIS — Z01812 Encounter for preprocedural laboratory examination: Secondary | ICD-10-CM

## 2023-01-30 DIAGNOSIS — I4891 Unspecified atrial fibrillation: Secondary | ICD-10-CM | POA: Diagnosis not present

## 2023-01-30 NOTE — Progress Notes (Signed)
Cardiology Office Note:  .   Date:  01/30/2023 ID:  [name], DOB 05-22-1950, MRN [id number] PCP: Ignatius Specking, MD  [facility] Providers Cardiologist:  Nona Dell, MD    History of Present Illness: .   [name] is a 72 y.o. female with a PMH of A-flutter, frequent PVCs, hypertension, Barrett's esophagus, GERD, IBS, hepatitis C, s/p treatment in remission, obesity, depression/anxiety, COPD, hypothyroidism, who presents today for 4-week follow-up.    She was newly diagnosed with new onset a flutter while presenting to facility for scheduled EGD, previous ZIO monitor confirmed A-fib/flutter with heart rate in 50s -see full report below.  Last seen by Charlsie Quest, NP on December 30, 2022.  Patient noted worsening shortness of breath, fatigue, and sleepiness.  Her previous EGD was canceled due to new onset A-fib.  EKG in office that day revealed a flutter with varying AV block, was found to be rate controlled on beta-blocker therapy.  In office that day she was started on Eliquis 5 mg twice daily for CHA2DS2-VASc score is 3, aspirin was discontinued.  Medication compliance was discussed with patient and family and to be scheduled for DCCV if she remained in A-fib at 3 to 4 weeks.  Echocardiogram was arranged for her shortness of breath/DOE-LVEF 65 to 70%, mitral valve was found to have trivial leakage, no other valve abnormalities noted.  Study was reassuring.  Today she presents for 4 week follow-up. She continues to endorse similar/stable shortness of breath and fatigue, says there has been no change in her symptoms. Reports she had an episode of chest pain last week, says "it feels like I got heartburn." Difficult for patient to describe. Says she can feel her A-fib, and that is when her "chest hurts." Pt has fallen twice since last office visit, denies any acute injuries, says she bruised her right knee. Denies any syncope, presyncope, dizziness, orthopnea, PND, swelling or  significant weight changes, acute bleeding, or claudication.  ROS: Negative. See HPI.   Studies Reviewed: Marland Kitchen    EKG:  EKG Interpretation Date/Time:  Friday January 30 2023 14:03:33 EST Ventricular Rate:  66 PR Interval:    QRS Duration:  70 QT Interval:  386 QTC Calculation: 404 R Axis:   19  Text Interpretation: Atrial fibrillation with premature ventricular or aberrantly conducted complexes Low voltage QRS Septal infarct (cited on or before 09-May-2020) When compared with ECG of 30-Dec-2022 14:24, Atrial fibrillation has replaced Atrial flutter Confirmed by Sharlene Dory [phone]) on 01/30/2023 2:13:52 PM   Echo 01/2023:   1. Left ventricular ejection fraction, by estimation, is 65 to 70%. The  left ventricle has normal function. The left ventricle has no regional  wall motion abnormalities. There is mild concentric left ventricular  hypertrophy. Left ventricular diastolic  parameters are indeterminate.   2. Right ventricular systolic function is low normal. The right  ventricular size is normal. There is normal pulmonary artery systolic  pressure. The estimated right ventricular systolic pressure is 24.0 mmHg.   3. Left atrial size was severely dilated.   4. Right atrial size was severely dilated.   5. The mitral valve is degenerative. Trivial mitral valve regurgitation.   6. The aortic valve is tricuspid. There is mild calcification of the  aortic valve. Aortic valve regurgitation is not visualized.   7. The inferior vena cava is normal in size with greater than 50%  respiratory variability, suggesting right atrial pressure of 3 mmHg.   Comparison(s): No significant  change from prior study. Prior images  reviewed side by side.  Cardiac monitor 12/2022:  Predominant rhythm is coarse atrial fibrillation/flutter with heart rate ranging from 38 bpm up to 146 bpm and average heart rate 58 bpm. Frequent PVCs were noted representing 19.8% total beats with otherwise rare  ventricular couplets and triplets and limited episodes of ventricular bigeminy and trigeminy.  Single, 6 beat burst of NSVT was noted.  There were no sustained arrhythmias. No pauses or high degree heart block.  Lexiscan 01/2019:  There was no ST segment deviation noted during stress. Normal myocardial perfusion. Extensive soft tissue attenuation noted. This is an intermediate risk study, primarily based on reduced LVEF. However, LV function appears grossly normal and the reduced LVEF may be due to gating artifact. If echocardiogram is performed and LV systolic function is found to be normal, this would be considered a low risk study. Nuclear stress EF: 37%. Risk Assessment/Calculations:    CHA2DS2-VASc Score = 3   This indicates a 3.2% annual risk of stroke. The patient's score is based upon: CHF History: 0 HTN History: 1 Diabetes History: 0 Stroke History: 0 Vascular Disease History: 0 Age Score: 1 Gender Score: 1   Physical Exam:   VS:  BP 115/62   Pulse 61   Ht 5\' 3"  (1.6 m)   Wt 193 lb 5.8 oz (87.7 kg)   SpO2 97%   BMI 34.25 kg/m    Wt Readings from Last 3 Encounters:  01/30/23 193 lb 5.8 oz (87.7 kg)  12/30/22 193 lb 6.4 oz (87.7 kg)  12/09/22 197 lb 1.5 oz (89.4 kg)    GEN: Obese, 72 y.o. female in no acute distress NECK: No JVD; No carotid bruits CARDIAC: S1/S2, irregularly irregular rhythm, no murmurs, rubs, gallops RESPIRATORY:  Clear to auscultation without rales, wheezing or rhonchi  ABDOMEN: Soft, non-tender, non-distended EXTREMITIES:  No edema; No deformity   ASSESSMENT AND PLAN: .    A-fib/A-flutter, pre-procedure lab exam EKG today reveals rate controlled A-fib.  Previous cardiac monitor revealed coarse A-fib/flutter with average heart rate in 50s, frequent PVCs were noted.  She has been compliant with Eliquis and denies skipping any doses.  Discussed cardioversion, including risk and benefits, she verbalized understanding is agreeable to proceed. Will  arrange.  Does admit to some palpitations at times as well as other nonspecific symptoms including shortness of breath and fatigue.  Continue Eliquis 5 mg twice daily, she is on appropriate dosage denies any bleeding issues.  Continue Toprol-XL.  Previous CBC showed stable hemoglobin hematocrit.  Will obtain BMET per protocol.  Will provide refill of Eliquis per her request.  Informed Consent   Shared Decision Making/Informed Consent The risks (stroke, cardiac arrhythmias rarely resulting in the need for a temporary or permanent pacemaker, skin irritation or burns and complications associated with conscious sedation including aspiration, arrhythmia, respiratory failure and death), benefits (restoration of normal sinus rhythm) and alternatives of a direct current cardioversion were explained in detail to [name] and she agrees to proceed.   2. Chest pain of uncertain etiology Atypical in etiology.  This seems to be related to when she has episodes of A-fib-see above.  No indication for ischemic evaluation at this time.  Not on aspirin due to being on Eliquis.  No medication changes at this time.  3. HTN Blood pressure stable and at goal. Discussed to monitor BP at home at least 2 hours after medications and sitting for 5-10 minutes.  No medication changes at this  time.  Will obtain BMET as mentioned above.  3. COPD Does admit to some stable shortness of breath.  Denies any recent worsening symptoms.  Felt to be partly related to her A-fib-see above.  Arranging DCCV.  Continue follow-up PCP.  No medication changes at this time.  4. Recent falls Denies any acute injuries.  Does show me small bruise to right lateral knee.  Discussed conservative measures and care and ED precautions discussed.  Recommend to continue follow-up with PCP.  May benefit from PT.  Dispo: Follow-up with me/[facility] in 3-4 weeks post DCCV or sooner if anything changes.   Signed, Sharlene Dory, NP

## 2023-01-30 NOTE — H&P (View-Only) (Signed)
Cardiology Office Note:  .   Date:  01/30/2023 ID:  [name], DOB [birth year], MRN [id number] PCP: Ignatius Specking, MD   [facility] Providers Cardiologist:  Nona Dell, MD    History of Present Illness: .   [name] is a 72 y.o. female with a PMH of A-flutter, frequent PVCs, hypertension, Barrett's esophagus, GERD, IBS, hepatitis C, s/p treatment in remission, obesity, depression/anxiety, COPD, hypothyroidism, who presents today for 4-week follow-up.    She was newly diagnosed with new onset a flutter while presenting to facility for scheduled EGD, previous ZIO monitor confirmed A-fib/flutter with heart rate in 50s -see full report below.  Last seen by Charlsie Quest, NP on December 30, 2022.  Patient noted worsening shortness of breath, fatigue, and sleepiness.  Her previous EGD was canceled due to new onset A-fib.  EKG in office that day revealed a flutter with varying AV block, was found to be rate controlled on beta-blocker therapy.  In office that day she was started on Eliquis 5 mg twice daily for CHA2DS2-VASc score is 3, aspirin was discontinued.  Medication compliance was discussed with patient and family and to be scheduled for DCCV if she remained in A-fib at 3 to 4 weeks.  Echocardiogram was arranged for her shortness of breath/DOE-LVEF 65 to 70%, mitral valve was found to have trivial leakage, no other valve abnormalities noted.  Study was reassuring.  Today she presents for 4 week follow-up. She continues to endorse similar/stable shortness of breath and fatigue, says there has been no change in her symptoms. Reports she had an episode of chest pain last week, says "it feels like I got heartburn." Difficult for patient to describe. Says she can feel her A-fib, and that is when her "chest hurts." Pt has fallen twice since last office visit, denies any acute injuries, says she bruised her right knee. Denies any syncope, presyncope, dizziness, orthopnea, PND, swelling or  significant weight changes, acute bleeding, or claudication.  ROS: Negative. See HPI.   Studies Reviewed: Marland Kitchen    EKG:  EKG Interpretation Date/Time:  Friday January 30 2023 14:03:33 EST Ventricular Rate:  66 PR Interval:    QRS Duration:  70 QT Interval:  386 QTC Calculation: 404 R Axis:   19  Text Interpretation: Atrial fibrillation with premature ventricular or aberrantly conducted complexes Low voltage QRS Septal infarct (cited on or before 09-May-2020) When compared with ECG of 30-Dec-2022 14:24, Atrial fibrillation has replaced Atrial flutter Confirmed by Sharlene Dory [id number]) on 01/30/2023 2:13:52 PM   Echo 01/2023:   1. Left ventricular ejection fraction, by estimation, is 65 to 70%. The  left ventricle has normal function. The left ventricle has no regional  wall motion abnormalities. There is mild concentric left ventricular  hypertrophy. Left ventricular diastolic  parameters are indeterminate.   2. Right ventricular systolic function is low normal. The right  ventricular size is normal. There is normal pulmonary artery systolic  pressure. The estimated right ventricular systolic pressure is 24.0 mmHg.   3. Left atrial size was severely dilated.   4. Right atrial size was severely dilated.   5. The mitral valve is degenerative. Trivial mitral valve regurgitation.   6. The aortic valve is tricuspid. There is mild calcification of the  aortic valve. Aortic valve regurgitation is not visualized.   7. The inferior vena cava is normal in size with greater than 50%  respiratory variability, suggesting right atrial pressure of 3 mmHg.   Comparison(s): No significant  change from prior study. Prior images  reviewed side by side.  Cardiac monitor 12/2022:  Predominant rhythm is coarse atrial fibrillation/flutter with heart rate ranging from 38 bpm up to 146 bpm and average heart rate 58 bpm. Frequent PVCs were noted representing 19.8% total beats with otherwise rare  ventricular couplets and triplets and limited episodes of ventricular bigeminy and trigeminy.  Single, 6 beat burst of NSVT was noted.  There were no sustained arrhythmias. No pauses or high degree heart block.  Lexiscan 01/2019:  There was no ST segment deviation noted during stress. Normal myocardial perfusion. Extensive soft tissue attenuation noted. This is an intermediate risk study, primarily based on reduced LVEF. However, LV function appears grossly normal and the reduced LVEF may be due to gating artifact. If echocardiogram is performed and LV systolic function is found to be normal, this would be considered a low risk study. Nuclear stress EF: 37%. Risk Assessment/Calculations:    CHA2DS2-VASc Score = 3   This indicates a 3.2% annual risk of stroke. The patient's score is based upon: CHF History: 0 HTN History: 1 Diabetes History: 0 Stroke History: 0 Vascular Disease History: 0 Age Score: 1 Gender Score: 1   Physical Exam:   VS:  BP 115/62   Pulse 61   Ht 5\' 3"  (1.6 m)   Wt 193 lb 5.8 oz (87.7 kg)   SpO2 97%   BMI 34.25 kg/m    Wt Readings from Last 3 Encounters:  01/30/23 193 lb 5.8 oz (87.7 kg)  12/30/22 193 lb 6.4 oz (87.7 kg)  12/09/22 197 lb 1.5 oz (89.4 kg)    GEN: Obese, 72 y.o. female in no acute distress NECK: No JVD; No carotid bruits CARDIAC: S1/S2, irregularly irregular rhythm, no murmurs, rubs, gallops RESPIRATORY:  Clear to auscultation without rales, wheezing or rhonchi  ABDOMEN: Soft, non-tender, non-distended EXTREMITIES:  No edema; No deformity   ASSESSMENT AND PLAN: .    A-fib/A-flutter, pre-procedure lab exam EKG today reveals rate controlled A-fib.  Previous cardiac monitor revealed coarse A-fib/flutter with average heart rate in 50s, frequent PVCs were noted.  She has been compliant with Eliquis and denies skipping any doses.  Discussed cardioversion, including risk and benefits, she verbalized understanding is agreeable to proceed. Will  arrange.  Does admit to some palpitations at times as well as other nonspecific symptoms including shortness of breath and fatigue.  Continue Eliquis 5 mg twice daily, she is on appropriate dosage denies any bleeding issues.  Continue Toprol-XL.  Previous CBC showed stable hemoglobin hematocrit.  Will obtain BMET per protocol.  Will provide refill of Eliquis per her request.  Informed Consent   Shared Decision Making/Informed Consent The risks (stroke, cardiac arrhythmias rarely resulting in the need for a temporary or permanent pacemaker, skin irritation or burns and complications associated with conscious sedation including aspiration, arrhythmia, respiratory failure and death), benefits (restoration of normal sinus rhythm) and alternatives of a direct current cardioversion were explained in detail to [name] and she agrees to proceed.   2. Chest pain of uncertain etiology Atypical in etiology.  This seems to be related to when she has episodes of A-fib-see above.  No indication for ischemic evaluation at this time.  Not on aspirin due to being on Eliquis.  No medication changes at this time.  3. HTN Blood pressure stable and at goal. Discussed to monitor BP at home at least 2 hours after medications and sitting for 5-10 minutes.  No medication changes at this  time.  Will obtain BMET as mentioned above.  3. COPD Does admit to some stable shortness of breath.  Denies any recent worsening symptoms.  Felt to be partly related to her A-fib-see above.  Arranging DCCV.  Continue follow-up PCP.  No medication changes at this time.  4. Recent falls Denies any acute injuries.  Does show me small bruise to right lateral knee.  Discussed conservative measures and care and ED precautions discussed.  Recommend to continue follow-up with PCP.  May benefit from PT.  Dispo: Follow-up with me/APP in 3-4 weeks post DCCV or sooner if anything changes.   Signed, Sharlene Dory, NP

## 2023-01-30 NOTE — Patient Instructions (Signed)
Medication Instructions:   Continue all current medications.   Labwork:  BMET - order given   Testing/Procedures:  Your physician has recommended that you have a Cardioversion (DCCV). Electrical Cardioversion uses a jolt of electricity to your heart either through paddles or wired patches attached to your chest. This is a controlled, usually prescheduled, procedure. Defibrillation is done under light anesthesia in the hospital, and you usually go home the day of the procedure. This is done to get your heart back into a normal rhythm. You are not awake for the procedure. Please see the instruction sheet given to you today.   Follow-Up:  1 month post cardioversion   Any Other Special Instructions Will Be Listed Below (If Applicable).   If you need a refill on your cardiac medications before your next appointment, please call your pharmacy.

## 2023-02-02 ENCOUNTER — Ambulatory Visit (INDEPENDENT_AMBULATORY_CARE_PROVIDER_SITE_OTHER): Payer: 59 | Admitting: Gastroenterology

## 2023-02-02 DIAGNOSIS — Z01812 Encounter for preprocedural laboratory examination: Secondary | ICD-10-CM | POA: Diagnosis not present

## 2023-02-02 DIAGNOSIS — I4892 Unspecified atrial flutter: Secondary | ICD-10-CM | POA: Diagnosis not present

## 2023-02-02 NOTE — Progress Notes (Signed)
Working on getting DCCV scheduled - waiting on call back from scheduler.

## 2023-02-03 ENCOUNTER — Telehealth: Payer: Self-pay | Admitting: Cardiology

## 2023-02-03 LAB — BASIC METABOLIC PANEL
BUN: 16 mg/dL (ref 7–25)
CO2: 25 mmol/L (ref 20–32)
Calcium: 9.7 mg/dL (ref 8.6–10.4)
Chloride: 106 mmol/L (ref 98–110)
Creat: 0.72 mg/dL (ref 0.60–1.00)
Glucose, Bld: 102 mg/dL — ABNORMAL HIGH (ref 65–99)
Potassium: 4.4 mmol/L (ref 3.5–5.3)
Sodium: 139 mmol/L (ref 135–146)

## 2023-02-03 NOTE — Progress Notes (Signed)
DCCV scheduled for Friday, 02/06/23 at 10:15 am with Dr. Jenene Slicker at [facility].  She will arrive at 8:15 am.    Pre-op day scheduled for tomorrow 02/04/23 at 12;45pm.    Instructions given.

## 2023-02-03 NOTE — Telephone Encounter (Signed)
Checking percert on the following patient for testing scheduled at [facility].   Cardioversion scheduled for Friday, 02/06/23 at 10:15 am with Dr. Jenene Slicker at [facility]

## 2023-02-04 ENCOUNTER — Telehealth (INDEPENDENT_AMBULATORY_CARE_PROVIDER_SITE_OTHER): Payer: Self-pay | Admitting: Gastroenterology

## 2023-02-04 ENCOUNTER — Encounter (HOSPITAL_COMMUNITY)
Admission: RE | Admit: 2023-02-04 | Discharge: 2023-02-04 | Disposition: A | Discharge status: Home / Self Care | Payer: 59 | Source: Ambulatory Visit | Attending: Internal Medicine | Admitting: Internal Medicine

## 2023-02-04 NOTE — Telephone Encounter (Signed)
    02/04/23  [name] 08/07/1950  What type of surgery is being performed? EGD  When is surgery scheduled? TBD  Cardiac Clearance  Name of physician performing surgery?  Dr. Katrinka Blazing [facility] at [facility] Phone: [phone] Fax: [phone]  Anethesia type (none, local, MAC, general)? MAC

## 2023-02-04 NOTE — Telephone Encounter (Signed)
Due: 2 weeks ago Received: 1 month ago Marlowe Shores, LPN  Marlowe Shores, LPN please put a reminder for possible repeat EGD in 6-8 weeks pending cardiology clearance, thanks   Pt is having cardioversion on 02/06/23 with Dr. Jenene Slicker. Would you still like me schedule EGD or does pt need office visit? Please advise. Thank you!

## 2023-02-04 NOTE — Telephone Encounter (Signed)
She has an appointment with cardiology on 12/12. Please message them to obtain clearance and we will schedule after this has been obtained. Thanks

## 2023-02-04 NOTE — Telephone Encounter (Signed)
Clearance sent 02/04/23

## 2023-02-06 ENCOUNTER — Encounter (HOSPITAL_COMMUNITY): Payer: Self-pay | Admitting: Internal Medicine

## 2023-02-06 ENCOUNTER — Ambulatory Visit (HOSPITAL_COMMUNITY): Payer: 59 | Admitting: Anesthesiology

## 2023-02-06 ENCOUNTER — Encounter (HOSPITAL_COMMUNITY)
Admission: RE | Disposition: A | Discharge status: Home / Self Care | Payer: Self-pay | Source: Home / Self Care | Attending: Internal Medicine

## 2023-02-06 ENCOUNTER — Other Ambulatory Visit: Payer: Self-pay

## 2023-02-06 ENCOUNTER — Ambulatory Visit (HOSPITAL_COMMUNITY)
Admission: RE | Admit: 2023-02-06 | Discharge: 2023-02-06 | Disposition: A | Discharge status: Home / Self Care | Payer: 59 | Attending: Internal Medicine | Admitting: Internal Medicine

## 2023-02-06 DIAGNOSIS — K589 Irritable bowel syndrome without diarrhea: Secondary | ICD-10-CM | POA: Insufficient documentation

## 2023-02-06 DIAGNOSIS — F419 Anxiety disorder, unspecified: Secondary | ICD-10-CM | POA: Insufficient documentation

## 2023-02-06 DIAGNOSIS — K2270 Barrett's esophagus without dysplasia: Secondary | ICD-10-CM | POA: Insufficient documentation

## 2023-02-06 DIAGNOSIS — I8510 Secondary esophageal varices without bleeding: Secondary | ICD-10-CM | POA: Diagnosis not present

## 2023-02-06 DIAGNOSIS — Z7901 Long term (current) use of anticoagulants: Secondary | ICD-10-CM | POA: Diagnosis not present

## 2023-02-06 DIAGNOSIS — K219 Gastro-esophageal reflux disease without esophagitis: Secondary | ICD-10-CM | POA: Insufficient documentation

## 2023-02-06 DIAGNOSIS — I493 Ventricular premature depolarization: Secondary | ICD-10-CM | POA: Diagnosis not present

## 2023-02-06 DIAGNOSIS — K7460 Unspecified cirrhosis of liver: Secondary | ICD-10-CM | POA: Insufficient documentation

## 2023-02-06 DIAGNOSIS — Z8619 Personal history of other infectious and parasitic diseases: Secondary | ICD-10-CM | POA: Diagnosis not present

## 2023-02-06 DIAGNOSIS — J449 Chronic obstructive pulmonary disease, unspecified: Secondary | ICD-10-CM | POA: Diagnosis not present

## 2023-02-06 DIAGNOSIS — I4891 Unspecified atrial fibrillation: Secondary | ICD-10-CM | POA: Diagnosis not present

## 2023-02-06 DIAGNOSIS — Z6834 Body mass index (BMI) 34.0-34.9, adult: Secondary | ICD-10-CM | POA: Insufficient documentation

## 2023-02-06 DIAGNOSIS — G4730 Sleep apnea, unspecified: Secondary | ICD-10-CM | POA: Insufficient documentation

## 2023-02-06 DIAGNOSIS — D649 Anemia, unspecified: Secondary | ICD-10-CM | POA: Diagnosis not present

## 2023-02-06 DIAGNOSIS — F32A Depression, unspecified: Secondary | ICD-10-CM | POA: Diagnosis not present

## 2023-02-06 DIAGNOSIS — E039 Hypothyroidism, unspecified: Secondary | ICD-10-CM | POA: Diagnosis not present

## 2023-02-06 DIAGNOSIS — E669 Obesity, unspecified: Secondary | ICD-10-CM | POA: Insufficient documentation

## 2023-02-06 DIAGNOSIS — I484 Atypical atrial flutter: Secondary | ICD-10-CM | POA: Diagnosis not present

## 2023-02-06 DIAGNOSIS — I10 Essential (primary) hypertension: Secondary | ICD-10-CM | POA: Insufficient documentation

## 2023-02-06 DIAGNOSIS — Z9181 History of falling: Secondary | ICD-10-CM | POA: Diagnosis not present

## 2023-02-06 DIAGNOSIS — R0602 Shortness of breath: Secondary | ICD-10-CM | POA: Insufficient documentation

## 2023-02-06 HISTORY — PX: CARDIOVERSION: SHX1299

## 2023-02-06 SURGERY — CARDIOVERSION
Anesthesia: General

## 2023-02-06 MED ORDER — AMIODARONE HCL 200 MG PO TABS: 200.0000 mg | ORAL_TABLET | Freq: Every day | ORAL | 0 refills | Status: DC

## 2023-02-06 MED ORDER — AMIODARONE HCL 200 MG PO TABS: 200.0000 mg | ORAL_TABLET | Freq: Two times a day (BID) | ORAL | 0 refills | Status: DC

## 2023-02-06 MED ORDER — LACTATED RINGERS IV SOLN: INTRAVENOUS | Status: DC

## 2023-02-06 MED ORDER — SODIUM CHLORIDE 0.9% FLUSH
10.0000 mL | Freq: Two times a day (BID) | INTRAVENOUS | Status: DC
  Administered 2023-02-06: 10 mL via INTRAVENOUS

## 2023-02-06 MED ORDER — CHLORHEXIDINE GLUCONATE 0.12 % MT SOLN
15.0000 mL | Freq: Once | OROMUCOSAL | Status: AC
  Administered 2023-02-06: 15 mL via OROMUCOSAL

## 2023-02-06 MED ORDER — LIDOCAINE HCL (CARDIAC) PF 100 MG/5ML IV SOSY
PREFILLED_SYRINGE | INTRAVENOUS | Status: DC | PRN
  Administered 2023-02-06: 50 mg via INTRAVENOUS

## 2023-02-06 MED ORDER — ORAL CARE MOUTH RINSE: 15.0000 mL | Freq: Once | OROMUCOSAL | Status: AC

## 2023-02-06 MED ORDER — PROPOFOL 10 MG/ML IV BOLUS
INTRAVENOUS | Status: DC | PRN
  Administered 2023-02-06 (×2): 50 mg via INTRAVENOUS

## 2023-02-06 NOTE — Progress Notes (Addendum)
Electrical Cardioversion Procedure Note [name] [id number] [birth year]  Procedure: Electrical Cardioversion Indications:  Atrial Fibrillation  Procedure Details Consent: Risks of procedure as well as the alternatives and risks of each were explained to the (patient/caregiver).  Consent for procedure obtained. Time Out: Verified patient identification, verified procedure, site/side was marked, verified correct patient position, special equipment/implants available, medications/allergies/relevent history reviewed, required imaging and test results available.  time out performed: 1038  Patient placed on cardiac monitor, pulse oximetry, supplemental oxygen as necessary.  Sedation given:  Propofol Pacer pads placed anterior and posterior chest.  Cardioverted 1 time(s).  Cardioverted at 150J @ 1042.  Evaluation Findings: Post procedure EKG shows: NSR Complications: None Patient did tolerate procedure well.   [name] 02/06/2023, 11:09 AM

## 2023-02-06 NOTE — Anesthesia Preprocedure Evaluation (Deleted)
Anesthesia Evaluation  Patient identified by MRN, date of birth, ID band Patient awake    Reviewed: Allergy & Precautions, NPO status , Patient's Chart, lab work & pertinent test results  Airway        Dental   Pulmonary shortness of breath, sleep apnea , COPD          Cardiovascular hypertension,      Neuro/Psych   Anxiety Depression       GI/Hepatic   Endo/Other    Renal/GU      Musculoskeletal   Abdominal   Peds  Hematology  (+) Blood dyscrasia, anemia   Anesthesia Other Findings   Reproductive/Obstetrics                             Anesthesia Physical Anesthesia Plan  ASA: 3  Anesthesia Plan: General   Post-op Pain Management:    Induction: Intravenous  PONV Risk Score and Plan: 1  Airway Management Planned: Nasal Cannula  Additional Equipment:   Intra-op Plan:   Post-operative Plan:   Informed Consent: I have reviewed the patients History and Physical, chart, labs and discussed the procedure including the risks, benefits and alternatives for the proposed anesthesia with the patient or authorized representative who has indicated his/her understanding and acceptance.     Dental advisory given  Plan Discussed with: Anesthesiologist and CRNA  Anesthesia Plan Comments:        Anesthesia Quick Evaluation

## 2023-02-06 NOTE — Telephone Encounter (Signed)
Spoke with Kenney Houseman from requesting office and she states that they usually require for patients to hold Eliquis but if patient is unable to then it would be fine but if not then they would send a new clearance after patient follows up with cardiology on 02/26/23

## 2023-02-06 NOTE — CV Procedure (Addendum)
   DIRECT CURRENT CARDIOVERSION  NAME:  [name]    MRN: [id number] DOB:  [birth year]    ADMIT DATE: 02/06/2023  INDICATIONS: Symptomatic atypical atrial flutter, rate controlled  PROCEDURE:  Informed consent was obtained prior to the procedure.Tthe patient had the defibrillator pads placed in the anterior and posterior position. Once an appropriate level of sedation was confirmed, the patient was cardioverted x 1 with 150J of biphasic synchronized energy.  The patient converted to NSR.  There were no apparent complications.  The patient had normal neuro status and respiratory status post procedure with vitals stable as recorded elsewhere.  Adequate airway was maintained throughout and vital signs monitored per protocol.  RECOMMENDATIONS: Post DCCV EKG showed normal sinus rhythm, HR 63 bpm. Start Amiodarone 200 mg BID x 3 weeks followed by Amiodarone 200 mg once daily. Amiodarone can be continued for a total duration of 3 to 6 months. Discontinue Metoprolol. Continue Eliquis 5 mg BID.  Monique Mantia Verne Spurr, MD [place]  [facility]  11:09 AM

## 2023-02-06 NOTE — Anesthesia Preprocedure Evaluation (Signed)
Anesthesia Evaluation  Patient identified by MRN, date of birth, ID band Patient awake    Reviewed: Allergy & Precautions, H&P , NPO status , Patient's Chart, lab work & pertinent test results, reviewed documented beta blocker date and time   Airway Mallampati: II  TM Distance: >3 FB Neck ROM: full    Dental no notable dental hx.    Pulmonary neg pulmonary ROS, shortness of breath, sleep apnea , COPD   Pulmonary exam normal breath sounds clear to auscultation       Cardiovascular Exercise Tolerance: Good hypertension, + dysrhythmias Atrial Fibrillation  Rhythm:irregular Rate:Normal     Neuro/Psych  Headaches PSYCHIATRIC DISORDERS Anxiety Depression    negative neurological ROS  negative psych ROS   GI/Hepatic negative GI ROS,GERD  ,,(+) Cirrhosis   Esophageal Varices    , Hepatitis -  Endo/Other  negative endocrine ROS    Renal/GU Renal diseasenegative Renal ROS  negative genitourinary   Musculoskeletal   Abdominal   Peds  Hematology negative hematology ROS (+) Blood dyscrasia, anemia   Anesthesia Other Findings   Reproductive/Obstetrics negative OB ROS                              Anesthesia Physical Anesthesia Plan  ASA: 3  Anesthesia Plan: General   Post-op Pain Management:    Induction:   PONV Risk Score and Plan: Propofol infusion  Airway Management Planned:   Additional Equipment:   Intra-op Plan:   Post-operative Plan:   Informed Consent: I have reviewed the patients History and Physical, chart, labs and discussed the procedure including the risks, benefits and alternatives for the proposed anesthesia with the patient or authorized representative who has indicated his/her understanding and acceptance.     Dental Advisory Given  Plan Discussed with: CRNA  Anesthesia Plan Comments:         Anesthesia Quick Evaluation

## 2023-02-06 NOTE — Anesthesia Postprocedure Evaluation (Signed)
Anesthesia Post Note  Patient: [name]  Procedure(s) Performed: CARDIOVERSION  Patient location during evaluation: Phase II Anesthesia Type: General Level of consciousness: awake Pain management: pain level controlled Vital Signs Assessment: post-procedure vital signs reviewed and stable Respiratory status: spontaneous breathing and respiratory function stable Cardiovascular status: blood pressure returned to baseline and stable Postop Assessment: no headache and no apparent nausea or vomiting Anesthetic complications: no Comments: Late entry   No notable events documented.   Last Vitals:  Vitals:   02/06/23 1130 02/06/23 1146  BP: (!) 126/52 116/61  Pulse: (!) 57 63  Resp: 15 15  Temp: (!) 36.3 C (!) 36.3 C  SpO2: 96% 97%    Last Pain:  Vitals:   02/06/23 1146  TempSrc: Oral  PainSc: 0-No pain                 Windell Norfolk

## 2023-02-06 NOTE — Telephone Encounter (Signed)
Heartcare contacted office and asked if clearance was needed to hold Eliquis. Advised heart care that per Dr.Castaneda we could schedule her after 02/26/23 and will resend clearance at that time.

## 2023-02-06 NOTE — Interval H&P Note (Signed)
History and Physical Interval Note:  02/06/2023 10:37 AM  [name]  has presented today for surgery, with the diagnosis of a-fib.  The various methods of treatment have been discussed with the patient and family. After consideration of risks, benefits and other options for treatment, the patient has consented to  Procedure(s): CARDIOVERSION (N/A) as a surgical intervention.  The patient's history has been reviewed, patient examined, no change in status, stable for surgery.  I have reviewed the patient's chart and labs.  Questions were answered to the patient's satisfaction.    EKG shows rate controlled atypical atrial flutter. Did not miss AC in the last 4 weeks. Last dose this morning.   [name], MD [facility].

## 2023-02-06 NOTE — Transfer of Care (Signed)
Immediate Anesthesia Transfer of Care Note  Patient: [name]  Procedure(s) Performed: CARDIOVERSION  Patient Location: PACU  Anesthesia Type:General  Level of Consciousness: awake, drowsy, and patient cooperative  Airway & Oxygen Therapy: Patient Spontanous Breathing and Patient connected to nasal cannula oxygen  Post-op Assessment: Report given to RN, Post -op Vital signs reviewed and stable, and Patient moving all extremities X 4  Post vital signs: Reviewed and stable  Last Vitals:  Vitals Value Taken Time  BP 83/51 02/06/23  Temp    Pulse 60 02/06/23  Resp 20 02/06/23  SpO2 99 02/06/23    Last Pain:  Vitals:   02/06/23 0931  TempSrc: Oral  PainSc: 0-No pain         Complications: No notable events documented.

## 2023-02-09 ENCOUNTER — Encounter (HOSPITAL_COMMUNITY): Payer: Self-pay | Admitting: Internal Medicine

## 2023-02-24 ENCOUNTER — Other Ambulatory Visit: Payer: Self-pay | Admitting: Internal Medicine

## 2023-02-26 ENCOUNTER — Ambulatory Visit: Payer: 59 | Attending: Nurse Practitioner | Admitting: Nurse Practitioner

## 2023-02-26 ENCOUNTER — Encounter: Payer: Self-pay | Admitting: Nurse Practitioner

## 2023-02-26 ENCOUNTER — Telehealth: Payer: Self-pay | Admitting: Nurse Practitioner

## 2023-02-26 ENCOUNTER — Telehealth (INDEPENDENT_AMBULATORY_CARE_PROVIDER_SITE_OTHER): Payer: Self-pay | Admitting: Gastroenterology

## 2023-02-26 VITALS — BP 110/70 | HR 70 | Ht 63.0 in | Wt 198.0 lb

## 2023-02-26 DIAGNOSIS — I4891 Unspecified atrial fibrillation: Secondary | ICD-10-CM

## 2023-02-26 DIAGNOSIS — I10 Essential (primary) hypertension: Secondary | ICD-10-CM | POA: Diagnosis not present

## 2023-02-26 DIAGNOSIS — J449 Chronic obstructive pulmonary disease, unspecified: Secondary | ICD-10-CM

## 2023-02-26 DIAGNOSIS — I4892 Unspecified atrial flutter: Secondary | ICD-10-CM | POA: Diagnosis not present

## 2023-02-26 DIAGNOSIS — Z01810 Encounter for preprocedural cardiovascular examination: Secondary | ICD-10-CM

## 2023-02-26 NOTE — Telephone Encounter (Signed)
-----   Message from Sharlene Dory sent at 02/26/2023  1:25 PM EST ----- Since we are stopping amiodarone, please bring patient back in 1 to 2 weeks for nurse visit for EKG.   Continue same treatment plan.  Thanks!   Best,  Sharlene Dory, NP

## 2023-02-26 NOTE — Telephone Encounter (Signed)
Nurse visit scheduled.

## 2023-02-26 NOTE — Telephone Encounter (Signed)
Pt has appt 02/26/23 with Sharlene Dory, NP. I will forward notes to NP for appt today.   Confirmed Eliquis will need to be held.

## 2023-02-26 NOTE — Progress Notes (Addendum)
Cardiology Office Note:  .   Date:  02/26/2023 ID:  [name], DOB [birth year], MRN [id number] PCP: Ignatius Specking, MD  [facility] Providers Cardiologist:  Nona Dell, MD    History of Present Illness: .   [name] is a 72 y.o. female with a PMH of A-flutter, frequent PVCs, hypertension, Barrett's esophagus, GERD, IBS, hepatitis C, s/p treatment in remission, obesity, depression/anxiety, COPD, hypothyroidism, who presents today for 4-week follow-up.    She was newly diagnosed with new onset a flutter while presenting to facility for scheduled EGD, previous ZIO monitor confirmed A-fib/flutter with heart rate in 50s -see full report below.  Last seen by Charlsie Quest, NP on December 30, 2022.  Patient noted worsening shortness of breath, fatigue, and sleepiness.  Her previous EGD was canceled due to new onset A-fib.  EKG in office that day revealed a flutter with varying AV block, was found to be rate controlled on beta-blocker therapy.  In office that day she was started on Eliquis 5 mg twice daily for CHA2DS2-VASc score is 3, aspirin was discontinued.  Medication compliance was discussed with patient and family and to be scheduled for DCCV if she remained in A-fib at 3 to 4 weeks.  Echocardiogram was arranged for her shortness of breath/DOE-LVEF 65 to 70%, mitral valve was found to have trivial leakage, no other valve abnormalities noted.  Study was reassuring.  01/30/2023 - Today she presents for 4 week follow-up. She continues to endorse similar/stable shortness of breath and fatigue, says there has been no change in her symptoms. Reports she had an episode of chest pain last week, says "it feels like I got heartburn." Difficult for patient to describe. Says she can feel her A-fib, and that is when her "chest hurts." Pt has fallen twice since last office visit, denies any acute injuries, says she bruised her right knee. Denies any syncope, presyncope, dizziness, orthopnea, PND,  swelling or significant weight changes, acute bleeding, or claudication.   She underwent cardioversion on February 06, 2023.  Today she presents for follow-up.  She says after cardioversion when she was out of A-fib she felt better, however now she feels tired, lacks energy and believes she is back in A-fib. Denies any chest pain, palpitations, syncope, presyncope, dizziness, orthopnea, PND, swelling or significant weight changes, acute bleeding, or claudication.  Continues to note similar/stable shortness of breath, similar to last office visit.  She admits to jitteriness, anxiety that she wonders is related to one of her cardiac medications.  She has not had any falls, but admits to some near falls.  Says she has recently been having anxiety/stuttering, says she has never experienced stuttering before.  ROS: Negative. See HPI.   Studies Reviewed: Marland Kitchen    EKG: EKG is not ordered today.  Echo 01/2023:   1. Left ventricular ejection fraction, by estimation, is 65 to 70%. The  left ventricle has normal function. The left ventricle has no regional  wall motion abnormalities. There is mild concentric left ventricular  hypertrophy. Left ventricular diastolic  parameters are indeterminate.   2. Right ventricular systolic function is low normal. The right  ventricular size is normal. There is normal pulmonary artery systolic  pressure. The estimated right ventricular systolic pressure is 24.0 mmHg.   3. Left atrial size was severely dilated.   4. Right atrial size was severely dilated.   5. The mitral valve is degenerative. Trivial mitral valve regurgitation.   6. The aortic valve is  tricuspid. There is mild calcification of the  aortic valve. Aortic valve regurgitation is not visualized.   7. The inferior vena cava is normal in size with greater than 50%  respiratory variability, suggesting right atrial pressure of 3 mmHg.   Comparison(s): No significant change from prior study. Prior images   reviewed side by side.  Cardiac monitor 12/2022:  Predominant rhythm is coarse atrial fibrillation/flutter with heart rate ranging from 38 bpm up to 146 bpm and average heart rate 58 bpm. Frequent PVCs were noted representing 19.8% total beats with otherwise rare ventricular couplets and triplets and limited episodes of ventricular bigeminy and trigeminy.  Single, 6 beat burst of NSVT was noted.  There were no sustained arrhythmias. No pauses or high degree heart block.  Lexiscan 01/2019:  There was no ST segment deviation noted during stress. Normal myocardial perfusion. Extensive soft tissue attenuation noted. This is an intermediate risk study, primarily based on reduced LVEF. However, LV function appears grossly normal and the reduced LVEF may be due to gating artifact. If echocardiogram is performed and LV systolic function is found to be normal, this would be considered a low risk study. Nuclear stress EF: 37%. Risk Assessment/Calculations:    CHA2DS2-VASc Score = 3   This indicates a 3.2% annual risk of stroke. The patient's score is based upon: CHF History: 0 HTN History: 1 Diabetes History: 0 Stroke History: 0 Vascular Disease History: 0 Age Score: 1 Gender Score: 1   Physical Exam:   VS:  BP 110/70   Pulse 70   Ht 5\' 3"  (1.6 m)   Wt 198 lb (89.8 kg)   SpO2 97%   BMI 35.07 kg/m    Wt Readings from Last 3 Encounters:  02/26/23 198 lb (89.8 kg)  02/06/23 193 lb 5.8 oz (87.7 kg)  01/30/23 193 lb 5.8 oz (87.7 kg)    GEN: Obese, 72 y.o. female in no acute distress NECK: No JVD; No carotid bruits CARDIAC: S1/S2, irregularly irregular rhythm, no murmurs, rubs, gallops RESPIRATORY:  Clear to auscultation without rales, wheezing or rhonchi  ABDOMEN: Soft, non-tender, non-distended EXTREMITIES:  No edema; No deformity   ASSESSMENT AND PLAN: .    A-fib/A-flutter She is back in A-fib as noted on exam, denies any palpitations or tachycardia, but continues to note  similar symptoms from previous office visit while she was in A-fib-see HPI.  Will stop amiodarone to see if this improves her jitteriness/anxiety/stuttering.  Continue Eliquis due to CHA2DS2-VASc score 3.  She is on appropriate dosage and denies any bleeding issues.  Discussed other treatment options regarding A-fib, patient has some concerns with medical options.  Discussed EP referral, she is agreeable to proceed.  Will bring patient back in 1 to 2 weeks for nurse visit for EKG.  2. HTN Blood pressure stable. Discussed to monitor BP at home at least 2 hours after medications and sitting for 5-10 minutes.  No medication changes at this time.    3. COPD Does admit to some stable shortness of breath.  Denies any recent worsening symptoms.  Felt to be partly related to her A-fib-see above.  Continue follow-up PCP.  No medication changes at this time.  Referring to EP as noted above.  4. Pre-operative cardiovascular risk assessment } Ms. Menken's perioperative risk of a major cardiac event is 0.4% according to the Revised Cardiac Risk Index (RCRI).  Therefore, she is at low risk for perioperative complications.   Her functional capacity is good at 5.07 METs according  to the Duke Activity Status Index (DASI). Recommendations: According to ACC/AHA guidelines, no further cardiovascular testing needed.  The patient may proceed to surgery at acceptable risk.   Antiplatelet and/or Anticoagulation Recommendations: She must remain on uninterrupted anticoagulation therapy for at least 4 weeks after her cardioversion which will be on December 22.  Will route note to clinical Pharm.D. regarding holding time preop regarding Eliquis.  Then will route note to requesting party.  Addendum 03/03/2023: Per office protocol, patient can hold Eliquis for 2 days prior to procedure.  However, patient's procedure should be scheduled NO EARLIER than 03/09/23. She will need to continue interrupted anticoagulation through 03/06/23.  Will fax note to requesting party.   Dispo: Follow-up with me/[facility] in 6-8 weeks or sooner if anything changes.   Signed, Sharlene Dory, NP

## 2023-02-26 NOTE — Telephone Encounter (Signed)
[name] 1950/05/28   What type of surgery is being performed? EGD   When is surgery scheduled? TBD   Cardiac Clearance   Name of physician performing surgery?  Dr. Katrinka Blazing [facility] Phone: [phone] Fax: [phone]   Anethesia type (none, local, MAC, general)? MAC

## 2023-02-26 NOTE — Patient Instructions (Addendum)
Medication Instructions:  Your physician has recommended you make the following change in your medication:  Please Stop Amiodarone   Labwork: None   Testing/Procedures: None   Follow-Up: Your physician recommends that you schedule a follow-up appointment in: 6-8 weeks   Any Other Special Instructions Will Be Listed Below (If Applicable).  If you need a refill on your cardiac medications before your next appointment, please call your pharmacy.

## 2023-02-26 NOTE — Telephone Encounter (Signed)
Patient is on Eliquis for Afib. Please clarify with GI if they want this held before the EGD.

## 2023-02-27 ENCOUNTER — Telehealth: Payer: Self-pay | Admitting: Pharmacist

## 2023-02-27 NOTE — Telephone Encounter (Signed)
Patient with diagnosis of afib on Eliquis for anticoagulation.    Procedure: EGD Date of procedure: TBD   CHA2DS2-VASc Score = 3   This indicates a 3.2% annual risk of stroke. The patient's score is based upon: CHF History: 0 HTN History: 1 Diabetes History: 0 Stroke History: 0 Vascular Disease History: 0 Age Score: 1 Gender Score: 1      Patient underwent cardioversion on February 06, 2023   CrCl 75 ml  Per office protocol, patient can hold Eliquis for 2 days prior to procedure.  However, patient's procedure should be scheduled NO EARLIER than 03/09/23. She will need to continue interrupted anticoagulation through 03/06/23.    **This guidance is not considered finalized until pre-operative APP has relayed final recommendations.**

## 2023-03-12 NOTE — Telephone Encounter (Signed)
Thanks, agree, clearance to hold Eliquis is required Thanks

## 2023-03-13 NOTE — Telephone Encounter (Signed)
Pt contacted to schedule EGD. Clearance received from cardiology. Pt states she would like to come in to discuss with Dr.Castaneda before scheduling anything.   [name]-please schedule pt office visit. Thank you!

## 2023-03-13 NOTE — Telephone Encounter (Signed)
I will fax notes from Sharlene Dory, NP ov notes 02/26/23. See A&P for preop clearance and recommendations for Eliquis hold.

## 2023-03-16 ENCOUNTER — Encounter (INDEPENDENT_AMBULATORY_CARE_PROVIDER_SITE_OTHER): Payer: Self-pay

## 2023-03-16 ENCOUNTER — Ambulatory Visit: Payer: 59 | Attending: Cardiology

## 2023-03-16 ENCOUNTER — Emergency Department (HOSPITAL_COMMUNITY): Payer: 59

## 2023-03-16 ENCOUNTER — Other Ambulatory Visit: Payer: Self-pay

## 2023-03-16 ENCOUNTER — Encounter (HOSPITAL_COMMUNITY): Payer: Self-pay | Admitting: *Deleted

## 2023-03-16 ENCOUNTER — Emergency Department (HOSPITAL_COMMUNITY)
Admission: EM | Admit: 2023-03-16 | Discharge: 2023-03-16 | Disposition: A | Discharge status: Home / Self Care | Payer: 59 | Attending: Emergency Medicine | Admitting: Emergency Medicine

## 2023-03-16 VITALS — BP 118/64 | HR 93 | Wt 201.0 lb

## 2023-03-16 DIAGNOSIS — J449 Chronic obstructive pulmonary disease, unspecified: Secondary | ICD-10-CM | POA: Diagnosis not present

## 2023-03-16 DIAGNOSIS — R519 Headache, unspecified: Secondary | ICD-10-CM | POA: Insufficient documentation

## 2023-03-16 DIAGNOSIS — W19XXXA Unspecified fall, initial encounter: Secondary | ICD-10-CM | POA: Insufficient documentation

## 2023-03-16 DIAGNOSIS — Z7901 Long term (current) use of anticoagulants: Secondary | ICD-10-CM | POA: Diagnosis not present

## 2023-03-16 DIAGNOSIS — R220 Localized swelling, mass and lump, head: Secondary | ICD-10-CM | POA: Diagnosis not present

## 2023-03-16 DIAGNOSIS — M47812 Spondylosis without myelopathy or radiculopathy, cervical region: Secondary | ICD-10-CM | POA: Diagnosis not present

## 2023-03-16 DIAGNOSIS — I10 Essential (primary) hypertension: Secondary | ICD-10-CM | POA: Diagnosis not present

## 2023-03-16 DIAGNOSIS — R600 Localized edema: Secondary | ICD-10-CM | POA: Diagnosis not present

## 2023-03-16 DIAGNOSIS — M4802 Spinal stenosis, cervical region: Secondary | ICD-10-CM | POA: Diagnosis not present

## 2023-03-16 LAB — URINALYSIS, ROUTINE W REFLEX MICROSCOPIC
Bacteria, UA: NONE SEEN
Bilirubin Urine: NEGATIVE
Glucose, UA: NEGATIVE mg/dL
Ketones, ur: NEGATIVE mg/dL
Leukocytes,Ua: NEGATIVE
Nitrite: NEGATIVE
Protein, ur: NEGATIVE mg/dL
Specific Gravity, Urine: 1.008 (ref 1.005–1.030)
pH: 5 (ref 5.0–8.0)

## 2023-03-16 LAB — BASIC METABOLIC PANEL
Anion gap: 9 (ref 5–15)
BUN: 14 mg/dL (ref 8–23)
CO2: 23 mmol/L (ref 22–32)
Calcium: 9.8 mg/dL (ref 8.9–10.3)
Chloride: 105 mmol/L (ref 98–111)
Creatinine, Ser: 0.79 mg/dL (ref 0.44–1.00)
GFR, Estimated: >60 mL/min (ref >60)
Glucose, Bld: 100 mg/dL — ABNORMAL HIGH (ref 70–99)
Potassium: 3.7 mmol/L (ref 3.5–5.1)
Sodium: 137 mmol/L (ref 135–145)

## 2023-03-16 LAB — HEPATIC FUNCTION PANEL
ALT: 18 U/L (ref 0–44)
AST: 26 U/L (ref 15–41)
Albumin: 3.5 g/dL (ref 3.5–5.0)
Alkaline Phosphatase: 96 U/L (ref 38–126)
Bilirubin, Direct: 0.3 mg/dL — ABNORMAL HIGH (ref 0.0–0.2)
Indirect Bilirubin: 1 mg/dL — ABNORMAL HIGH (ref 0.3–0.9)
Total Bilirubin: 1.3 mg/dL — ABNORMAL HIGH (ref 0.0–1.2)
Total Protein: 7 g/dL (ref 6.5–8.1)

## 2023-03-16 LAB — CBC
HCT: 39.1 % (ref 36.0–46.0)
Hemoglobin: 13.4 g/dL (ref 12.0–15.0)
MCH: 32.9 pg (ref 26.0–34.0)
MCHC: 34.3 g/dL (ref 30.0–36.0)
MCV: 96.1 fL (ref 80.0–100.0)
Platelets: 67 10*3/uL — ABNORMAL LOW (ref 150–400)
RBC: 4.07 MIL/uL (ref 3.87–5.11)
RDW: 15.9 % — ABNORMAL HIGH (ref 11.5–15.5)
WBC: 4.7 10*3/uL (ref 4.0–10.5)
nRBC: 0 % (ref 0.0–0.2)

## 2023-03-16 LAB — MAGNESIUM: Magnesium: 1.7 mg/dL (ref 1.7–2.4)

## 2023-03-16 LAB — CBG MONITORING, ED: Glucose-Capillary: 91 mg/dL (ref 70–99)

## 2023-03-16 MED ORDER — METOCLOPRAMIDE HCL 5 MG PO TABS: 5.0000 mg | ORAL_TABLET | Freq: Three times a day (TID) | ORAL | 0 refills | Status: AC | PRN

## 2023-03-16 MED ORDER — DOXYCYCLINE HYCLATE 100 MG PO CAPS: 100.0000 mg | ORAL_CAPSULE | Freq: Two times a day (BID) | ORAL | 0 refills | Status: AC

## 2023-03-16 MED ORDER — METOCLOPRAMIDE HCL 5 MG/ML IJ SOLN
5.0000 mg | Freq: Once | INTRAMUSCULAR | Status: AC
  Administered 2023-03-16: 5 mg via INTRAVENOUS
  Filled 2023-03-16: qty 2

## 2023-03-16 MED ORDER — DOXYCYCLINE HYCLATE 100 MG PO TABS
100.0000 mg | ORAL_TABLET | Freq: Once | ORAL | Status: AC
  Administered 2023-03-16: 100 mg via ORAL
  Filled 2023-03-16: qty 1

## 2023-03-16 MED ORDER — DIPHENHYDRAMINE HCL 50 MG/ML IJ SOLN
12.5000 mg | Freq: Once | INTRAMUSCULAR | Status: AC
  Administered 2023-03-16: 12.5 mg via INTRAVENOUS
  Filled 2023-03-16: qty 1

## 2023-03-16 NOTE — Progress Notes (Signed)
Patient here today to have EKG and vitals checked after stopping Amiodarone at last office visit with [facility]  Patient states she has some leg and feet swelling visually not very much swelling Patient weight today is 201.6 last OV it was 198 12/12 Patient states since stopping Amiodarone she has had bad headaches and her heart feels fluttery  Patient states she fell last Thursday   EKG was performed and vitals  Sent to [place] for review  Spoke with peck she is concerned with patient falling and is unsure of if she hit her head or not patient seemed a little confused we sent patient to [facility] for evaluation patient's brother is taking her.  Will call patient later this week to check on swelling if swelling has not improved peck wants to start lasix 20 mg daily ([facility])  and check labs in 1 week BNP, BMET, and MAG at [facility] has no sooner appt than 2/6  Per peck She needs to get in with Dr. Nelly Laurence, we arranged and placed order at last visit with me. She's now in A-flutter. Recommend no changes to her medication at this time and see what Dr. Nelly Laurence says.

## 2023-03-16 NOTE — ED Triage Notes (Signed)
Pt with multiple falls at home, legs give out per pt.  Pt is on blood thinner and sent by PCP for a CT. Pt with hx of Afib and cardioversion, SOB with exertion-states is not new. C/o pain to left forehead.  Last fall was Thursday.

## 2023-03-16 NOTE — Discharge Instructions (Addendum)
Test results today are reassuring.  A prescription for an antibiotic was sent to your pharmacy.  Take as prescribed for the next 5 days for the wound on your right armpit.  A prescription for medication called metoclopramide was also sent to your pharmacy.  Take this as needed for headaches.  You can take it with Benadryl.  Follow-up with your primary care doctor and cardiologist.  Return to the emergency department for any new or worsening symptoms of concern.

## 2023-03-16 NOTE — ED Provider Notes (Signed)
[place] EMERGENCY DEPARTMENT AT [facility], [place] Provider Note   CSN: [id number] Arrival date & time: 03/16/23  1019     History  Chief Complaint  Patient presents with   [name]    [name] is a 72 y.o. female.   Fall Associated symptoms include headaches.  Patient presents after multiple falls, most recent of which was 4 days ago.  Medical history includes COPD, HTN, GERD, migraines, sleep apnea, anxiety, depression, atrial fibrillation, cirrhosis.  She attributes her fall to her right knee giving out.  She has undergone knee replacement on this knee in the past.  Most recent fall was 4 days ago.  Since that time, she has had a left frontal headache.  She denies any other recent symptoms.  She was seen at a cardiology office earlier today.  She reported her recent falls.  Given her status on Eliquis for atrial fibrillation, she was advised to come to the ED for imaging.  Patient also reports a recent wound to her area of right axilla.  She attributes this to rubbing from her bra.  Area has had some recent bloody drainage.     Home Medications Prior to Admission medications   Medication Sig Start Date End Date Taking? Authorizing Provider  doxycycline (VIBRAMYCIN) 100 MG capsule Take 1 capsule (100 mg total) by mouth 2 (two) times daily for 5 days. 03/16/23 03/21/23 Yes Gloris Manchester, MD  metoCLOPramide (REGLAN) 5 MG tablet Take 1 tablet (5 mg total) by mouth every 8 (eight) hours as needed for nausea (headache). 03/16/23  Yes Gloris Manchester, MD  albuterol (PROVENTIL HFA;VENTOLIN HFA) 108 (90 Base) MCG/ACT inhaler Inhale 2 puffs into the lungs every 6 (six) hours as needed for wheezing or shortness of breath.    [provider]  ALPRAZolam Prudy Feeler) 1 MG tablet Take 1 tablet (1 mg total) by mouth 4 (four) times daily. 01/22/23   Myrlene Broker, MD  amiodarone (PACERONE) 200 MG tablet Take 1 tablet (200 mg total) by mouth daily. 02/28/23 05/29/23  Mallipeddi, Vishnu P, MD   amitriptyline (ELAVIL) 10 MG tablet Take 1 tablet (10 mg total) by mouth at bedtime. 01/22/23   Myrlene Broker, MD  apixaban (ELIQUIS) 5 MG TABS tablet Take 1 tablet (5 mg total) by mouth 2 (two) times daily. 12/30/22   Charlsie Quest, NP  Ascorbic Acid (VITAMIN C) 1000 MG tablet Take 1,000 mg by mouth daily.    [provider]  cyanocobalamin 1000 MCG tablet Take 2,000 mcg by mouth daily.    [provider]  ELDERBERRY PO Take 1 each by mouth daily.    [provider]  FLUoxetine (PROZAC) 40 MG capsule Take 1 capsule (40 mg total) by mouth daily. 01/22/23   Myrlene Broker, MD  Fluticasone-Umeclidin-Vilant 100-62.5-25 MCG/INH AEPB Inhale 1 puff into the lungs daily as needed (asthma).    [provider]  gabapentin (NEURONTIN) 300 MG capsule Take 300 mg by mouth 3 (three) times daily as needed (pain).    [provider]  linaclotide (LINZESS) 290 MCG CAPS capsule TAKE ONE CAPSULE BY MOUTH ONCE DAILY BEFORE BREAKFAST. Patient taking differently: Take 290 mcg by mouth daily as needed (constipation). 07/31/22   Carlan, Chelsea L, NP  loratadine (CLARITIN) 10 MG tablet Take 1 tablet by mouth daily.    [provider]  Multiple Vitamin (MULTI-VITAMIN PO) Take 1 tablet by mouth daily.    [provider]  nystatin (MYCOSTATIN) powder Apply 1 g  topically daily as needed (rash). 03/16/14   [provider]  omeprazole (PRILOSEC) 40 MG capsule TAKE 1 CAPSULE BY MOUTH TWICE DAILY 10/30/22   Carlan, Chelsea L, NP  polyethylene glycol (MIRALAX) 17 g packet Take 17 g by mouth daily. 06/20/14   [provider]  POTASSIUM PO Take 1 tablet by mouth daily.    [provider]  rosuvastatin (CRESTOR) 10 MG tablet Take 1 tablet (10 mg total) by mouth daily. 07/31/22   Jonelle Sidle, MD  Simethicone (GAS RELIEF PO) Take 2 capsules by mouth as needed (flatulence).     [provider]  temazepam (RESTORIL) 30 MG capsule  Take 1 capsule (30 mg total) by mouth at bedtime as needed. for sleep Patient taking differently: Take 30 mg by mouth at bedtime. for sleep 01/22/23   Myrlene Broker, MD  traMADol (ULTRAM) 50 MG tablet Take 50 mg by mouth 3 (three) times daily as needed for severe pain.    [provider]      Allergies    Acetaminophen, Levothyroxine, and Nabumetone    Review of Systems   Review of Systems  Neurological:  Positive for headaches.  All other systems reviewed and are negative.   Physical Exam Updated Vital Signs BP (!) 146/85   Pulse 79   Temp 98.3 F (36.8 C) (Oral)   Resp 20   Ht 5\' 3"  (1.6 m)   Wt 91.2 kg   SpO2 95%   BMI 35.61 kg/m  Physical Exam Vitals and nursing note reviewed.  Constitutional:      General: She is not in acute distress.    Appearance: Normal appearance. She is well-developed. She is not ill-appearing, toxic-appearing or diaphoretic.  HENT:     Head: Normocephalic and atraumatic.     Right Ear: External ear normal.     Left Ear: External ear normal.     Nose: Nose normal.     Mouth/Throat:     Mouth: Mucous membranes are moist.  Eyes:     Extraocular Movements: Extraocular movements intact.     Conjunctiva/sclera: Conjunctivae normal.  Cardiovascular:     Rate and Rhythm: Normal rate and regular rhythm.     Heart sounds: No murmur heard. Pulmonary:     Effort: Pulmonary effort is normal. No respiratory distress.     Breath sounds: Normal breath sounds. No wheezing or rales.  Chest:     Chest wall: No tenderness.  Abdominal:     General: There is no distension.     Palpations: Abdomen is soft.     Tenderness: There is no abdominal tenderness.  Musculoskeletal:        General: No swelling or deformity. Normal range of motion.     Cervical back: Normal range of motion and neck supple.     Right lower leg: Edema present.     Left lower leg: Edema present.  Skin:    General: Skin is warm and dry.     Coloration: Skin is not  jaundiced or pale.     Comments: Area of erythema in her right axilla.  Neurological:     General: No focal deficit present.     Mental Status: She is alert and oriented to person, place, and time.  Psychiatric:        Mood and Affect: Mood normal.        Behavior: Behavior normal.     ED Results / Procedures / Treatments   Labs (all  labs ordered are listed, but only abnormal results are displayed) Labs Reviewed  BASIC METABOLIC PANEL - Abnormal; Notable for the following components:      Result Value   Glucose, Bld 100 (*)    All other components within normal limits  CBC - Abnormal; Notable for the following components:   RDW 15.9 (*)    Platelets 67 (*)    All other components within normal limits  URINALYSIS, ROUTINE W REFLEX MICROSCOPIC - Abnormal; Notable for the following components:   Hgb urine dipstick SMALL (*)    All other components within normal limits  HEPATIC FUNCTION PANEL - Abnormal; Notable for the following components:   Total Bilirubin 1.3 (*)    Bilirubin, Direct 0.3 (*)    Indirect Bilirubin 1.0 (*)    All other components within normal limits  MAGNESIUM  CBG MONITORING, ED    EKG EKG Interpretation Date/Time:  Monday March 16 2023 14:44:35 EST Ventricular Rate:  83 PR Interval:    QRS Duration:  69 QT Interval:  215 QTC Calculation: 240 R Axis:   -14  Text Interpretation: Atrial fibrillation Ventricular premature complex Low voltage, precordial leads Probable anteroseptal infarct, old Repol abnrm suggests ischemia, inferior leads since last tracing no significant change Confirmed by Eber Hong ([id number]) on 03/16/2023 2:48:46 PM  Radiology CT HEAD WO CONTRAST Result Date: 03/16/2023 CLINICAL DATA:  Headache, increasing frequency or severity; Neck trauma (Age >= 65y). Multiple falls. On blood thinner. EXAM: CT HEAD WITHOUT CONTRAST CT CERVICAL SPINE WITHOUT CONTRAST TECHNIQUE: Multidetector CT imaging of the head and cervical spine was  performed following the standard protocol without intravenous contrast. Multiplanar CT image reconstructions of the cervical spine were also generated. RADIATION DOSE REDUCTION: This exam was performed according to the departmental dose-optimization program which includes automated exposure control, adjustment of the mA and/or kV according to patient size and/or use of iterative reconstruction technique. COMPARISON:  Head CT and MRI 10/08/2004 FINDINGS: CT HEAD FINDINGS Brain: There is no evidence of an acute infarct, intracranial hemorrhage, midline shift, or extra-axial fluid collection. The ventricles and sulci are normal. An approximately 1 cm extra-axial mass in the posterior fossa along the temporal bone on the right posterior to the internal auditory canal is stable to less prominent than on the prior studies and consistent with a meningioma without significant associated mass effect. Vascular: Calcified atherosclerosis at the skull base. Skull: No acute fracture or suspicious osseous lesion. Sinuses/Orbits: Visualized paranasal sinuses and mastoid air cells are clear. Bilateral cataract extraction. Other: None. CT CERVICAL SPINE FINDINGS Alignment: Cervical spine straightening.  No significant listhesis. Skull base and vertebrae: No acute fracture or suspicious osseous lesion. Soft tissues and spinal canal: No prevertebral fluid or swelling. No visible canal hematoma. Disc levels: Advanced disc degeneration at C3-4, C5-6, and C6-7 with uncovertebral spurring resulting in neural foraminal stenosis at these levels, most severe on the left at C5-6 and C6-7. Advanced right facet arthrosis at C4-5. Upper chest: Clear lung apices. Other: None. IMPRESSION: No evidence of acute intracranial abnormality or cervical spine fracture. Electronically Signed   By: Sebastian Ache M.D.   On: 03/16/2023 16:10   CT Cervical Spine Wo Contrast Result Date: 03/16/2023 CLINICAL DATA:  Headache, increasing frequency or severity;  Neck trauma (Age >= 65y). Multiple falls. On blood thinner. EXAM: CT HEAD WITHOUT CONTRAST CT CERVICAL SPINE WITHOUT CONTRAST TECHNIQUE: Multidetector CT imaging of the head and cervical spine was performed following the standard protocol without intravenous contrast. Multiplanar CT image  reconstructions of the cervical spine were also generated. RADIATION DOSE REDUCTION: This exam was performed according to the departmental dose-optimization program which includes automated exposure control, adjustment of the mA and/or kV according to patient size and/or use of iterative reconstruction technique. COMPARISON:  Head CT and MRI 10/08/2004 FINDINGS: CT HEAD FINDINGS Brain: There is no evidence of an acute infarct, intracranial hemorrhage, midline shift, or extra-axial fluid collection. The ventricles and sulci are normal. An approximately 1 cm extra-axial mass in the posterior fossa along the temporal bone on the right posterior to the internal auditory canal is stable to less prominent than on the prior studies and consistent with a meningioma without significant associated mass effect. Vascular: Calcified atherosclerosis at the skull base. Skull: No acute fracture or suspicious osseous lesion. Sinuses/Orbits: Visualized paranasal sinuses and mastoid air cells are clear. Bilateral cataract extraction. Other: None. CT CERVICAL SPINE FINDINGS Alignment: Cervical spine straightening.  No significant listhesis. Skull base and vertebrae: No acute fracture or suspicious osseous lesion. Soft tissues and spinal canal: No prevertebral fluid or swelling. No visible canal hematoma. Disc levels: Advanced disc degeneration at C3-4, C5-6, and C6-7 with uncovertebral spurring resulting in neural foraminal stenosis at these levels, most severe on the left at C5-6 and C6-7. Advanced right facet arthrosis at C4-5. Upper chest: Clear lung apices. Other: None. IMPRESSION: No evidence of acute intracranial abnormality or cervical spine  fracture. Electronically Signed   By: Sebastian Ache M.D.   On: 03/16/2023 16:10    Procedures Procedures    Medications Ordered in ED Medications  doxycycline (VIBRA-TABS) tablet 100 mg (100 mg Oral Given 03/16/23 1624)  metoCLOPramide (REGLAN) injection 5 mg (5 mg Intravenous Given 03/16/23 1710)  diphenhydrAMINE (BENADRYL) injection 12.5 mg (12.5 mg Intravenous Given 03/16/23 1709)    ED Course/ Medical Decision Making/ A&P                                 Medical Decision Making Amount and/or Complexity of Data Reviewed Labs: ordered. Radiology: ordered.  Risk Prescription drug management.   This patient presents to the ED for concern of fall, this involves an extensive number of treatment options, and is a complaint that carries with it a high risk of complications and morbidity.  The differential diagnosis includes acute injuries   Co morbidities that complicate the patient evaluation  COPD, HTN, GERD, migraines, sleep apnea, anxiety, depression, atrial fibrillation, cirrhosis   Additional history obtained:  Additional history obtained from N/A External records from outside source obtained and reviewed including EMR   Lab Tests:  I Ordered, and personally interpreted labs.  The pertinent results include: Normal hemoglobin, no leukocytosis, baseline thrombocytopenia, normal kidney function   Imaging Studies ordered:  I ordered imaging studies including CT of head and cervical spine I independently visualized and interpreted imaging which showed no acute findings I agree with the radiologist interpretation   Cardiac Monitoring: / EKG:  The patient was maintained on a cardiac monitor.  I personally viewed and interpreted the cardiac monitored which showed an underlying rhythm of: Atrial flutter with normal heart rate  Problem List / ED Course / Critical interventions / Medication management  Patient presents after recent falls.  Vital signs on arrival are  notable for mild hypertension.  Overall, patient is well-appearing on exam.  Breathing is unlabored.  She endorses a left frontal headache which has been present for the past 4 days.  Extraocular  movements are intact.  No focal neurologic deficits are present.  Exam is also notable for a area of wound in area of right axilla.  No current drainage is noted.  There is some surrounding erythema.  Will cover for cellulitis with doxycycline.  Patient underwent CT imaging of head and cervical spine.  No acute findings were identified.  Lab work is notable for baseline thrombocytopenia.  Given her thrombocytopenia, we will avoid NSAIDs for her headache.  Patient also states that she does not want to take Tylenol.  She was given some Reglan and Benadryl for symptomatic relief of her headache.  She was prescribed as needed Reglan to take at home.  She was provided with 5-day prescription for doxycycline.  She was advised to follow-up with her outpatient providers.  She was discharged in stable condition. I ordered medication including doxycycline for cellulitis; Reglan and Benadryl for headache Reevaluation of the patient after these medicines showed that the patient improved I have reviewed the patients home medicines and have made adjustments as needed   Social Determinants of Health:  Lives independently, has access to outpatient care        Final Clinical Impression(s) / ED Diagnoses Final diagnoses:  Fall, initial encounter    Rx / DC Orders ED Discharge Orders          Ordered    metoCLOPramide (REGLAN) 5 MG tablet  Every 8 hours PRN        03/16/23 1702    doxycycline (VIBRAMYCIN) 100 MG capsule  2 times daily        03/16/23 1711              Gloris Manchester, MD 03/16/23 [phone]

## 2023-03-19 DIAGNOSIS — M545 Low back pain, unspecified: Secondary | ICD-10-CM | POA: Diagnosis not present

## 2023-03-19 DIAGNOSIS — D696 Thrombocytopenia, unspecified: Secondary | ICD-10-CM | POA: Diagnosis not present

## 2023-03-19 DIAGNOSIS — I10 Essential (primary) hypertension: Secondary | ICD-10-CM | POA: Diagnosis not present

## 2023-03-19 DIAGNOSIS — I4891 Unspecified atrial fibrillation: Secondary | ICD-10-CM | POA: Diagnosis not present

## 2023-03-19 DIAGNOSIS — Z299 Encounter for prophylactic measures, unspecified: Secondary | ICD-10-CM | POA: Diagnosis not present

## 2023-03-19 DIAGNOSIS — J449 Chronic obstructive pulmonary disease, unspecified: Secondary | ICD-10-CM | POA: Diagnosis not present

## 2023-03-26 ENCOUNTER — Ambulatory Visit (INDEPENDENT_AMBULATORY_CARE_PROVIDER_SITE_OTHER): Payer: 59 | Admitting: Gastroenterology

## 2023-03-26 DIAGNOSIS — J069 Acute upper respiratory infection, unspecified: Secondary | ICD-10-CM | POA: Diagnosis not present

## 2023-03-26 DIAGNOSIS — R070 Pain in throat: Secondary | ICD-10-CM | POA: Diagnosis not present

## 2023-03-26 DIAGNOSIS — I10 Essential (primary) hypertension: Secondary | ICD-10-CM | POA: Diagnosis not present

## 2023-04-02 ENCOUNTER — Ambulatory Visit: Payer: 59 | Admitting: Cardiovascular Disease

## 2023-04-12 NOTE — Progress Notes (Unsigned)
Electrophysiology Office Note:    Date:  04/13/2023   ID:  KIMBERLEA SCHLAG, DOB 05/09/1950, MRN [id number]  PCP:  Ignatius Specking, MD   [facility] Providers Cardiologist:  Nona Dell, MD     Referring MD: Sharlene Dory, NP   History of Present Illness:    [name] is a 73 y.o. female with a medical history significant for atrial flutter, PVCs, hypertension, hepatitis C in remission, cirrhosis, hypothyroidism, COPD, referred for arrhythmia management.     She was initially diagnosed with atrial flutter when presenting for an EGD which was canceled due to the arrhythmia. she underwent DC cardioversion on February 06, 2023.  She reported that she felt better in sinus rhythm  I discussed the use of AI scribe software for clinical note transcription with the patient, who gave verbal consent to proceed.  The patient, with a history of cirrhosis, COPD, and atrial fibrillation, presents for a follow-up visit. She reports feeling her heart beating abnormally for the past three to four years, which she describes as a 'flutter.' The patient has been on blood pressure medication for an unspecified duration. The atrial fibrillation was discovered during an endoscopy appointment, which was subsequently cancelled due to the patient's heart rhythm. The patient underwent a cardioversion in November, but quickly reverted back to atrial fibrillation. She reports feeling more tired than usual and has noticed changes in her activity level, including shortness of breath and increased sleep. The patient also has a cough, which she attributes to a recent virus and her COPD. She is currently taking Eliquis, a blood thinner, without any reported issues. The patient has been tested for sleep apnea in the past, but does not recall the results and has not been on treatment. She has been losing weight, going from 224 to 196 pounds.     Today, she reports ongoing fatigue. She worked until age 33 and has  been very active until these palpitations and fatigue became overwhelming for her.  Now she is essentially sedentary.  EKGs/Labs/Other Studies Reviewed Today:     Echocardiogram:  TTE November 2024 EF 60 to 70%.  Severe biatrial dilation.  Trivial mitral regurgitation.   Monitors:  7 day monitor September 2024-- my interpretation 100% atrial fibrillation burden, possibly organizing into flutter: Heart rate 38 to 146 bpm, average 58 bpm 19.8% PVC burden.  PVCs are pleomorphic    EKG:   EKG Interpretation Date/Time:  Monday April 13 2023 10:39:10 EST Ventricular Rate:  86 PR Interval:    QRS Duration:  74 QT Interval:  344 QTC Calculation: 411 R Axis:   34  Text Interpretation: Atrial fibrillation with premature ventricular or aberrantly conducted complexes Nonspecific ST and T wave abnormality When compared with ECG of 16-Mar-2023 14:44, No significant change was found Confirmed by York Pellant [id number]) on 04/13/2023 10:53:33 AM     Physical Exam:    VS:  BP 130/78 (BP Location: Left Arm, Patient Position: Sitting, Cuff Size: Large)   Pulse 86   Ht 5\' 3"  (1.6 m)   Wt 195 lb 12.8 oz (88.8 kg)   SpO2 95%   BMI 34.68 kg/m     Wt Readings from Last 3 Encounters:  04/13/23 195 lb 12.8 oz (88.8 kg)  03/16/23 201 lb (91.2 kg)  03/16/23 201 lb (91.2 kg)     GEN:  Well nourished, well developed in no acute distress CARDIAC: iRRR, no murmurs, rubs, gallops RESPIRATORY:  Normal work of breathing MUSCULOSKELETAL: no  edema    ASSESSMENT & PLAN:     Atrial fibrillation and flutter Persistent, refractory to cardioversion Medical therapy is limited due to cirrhosis She is very symptomatic with severe fatigue and lack of energy We discussed management options.  I expressed my concern that she is at high risk for recurrence of A-fib due to severe atrial enlargement, what I anticipate is a longer duration of atrial fibrillation, as well as obesity and possibly untreated  sleep apnea.  Because her lifestyle has been so severely affected by AF, she would like to proceed with scheduling ablation at this time.  We discussed the indication, rationale, logistics, anticipated benefits, and potential risks of the ablation procedure including but not limited to -- bleed at the groin access site, chest pain, damage to nearby organs such as the diaphragm, lungs, or esophagus, need for a drainage tube, or prolonged hospitalization. I explained that the risk for stroke, heart attack, need for open chest surgery, or even death is very low but not zero. she  expressed understanding and wishes to proceed.   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

## 2023-04-13 ENCOUNTER — Telehealth: Payer: Self-pay | Admitting: *Deleted

## 2023-04-13 ENCOUNTER — Ambulatory Visit: Payer: 59 | Attending: Cardiovascular Disease | Admitting: Cardiovascular Disease

## 2023-04-13 ENCOUNTER — Encounter: Payer: Self-pay | Admitting: Cardiovascular Disease

## 2023-04-13 VITALS — BP 130/78 | HR 86 | Ht 63.0 in | Wt 195.8 lb

## 2023-04-13 DIAGNOSIS — I4891 Unspecified atrial fibrillation: Secondary | ICD-10-CM

## 2023-04-13 DIAGNOSIS — I4892 Unspecified atrial flutter: Secondary | ICD-10-CM | POA: Diagnosis not present

## 2023-04-13 DIAGNOSIS — I493 Ventricular premature depolarization: Secondary | ICD-10-CM | POA: Diagnosis not present

## 2023-04-13 NOTE — Patient Instructions (Signed)
Medication Instructions:  Your physician recommends that you continue on your current medications as directed. Please refer to the Current Medication list given to you today. *If you need a refill on your cardiac medications before your next appointment, please call your pharmacy*   Lab Work: CBC and BMET on Monday, Feb 24 - this can be completed at any [facility] - no appointment required and this does not have to be fasting  If you have labs (blood work) drawn today and your tests are completely normal, you will receive your results only by: MyChart Message (if you have MyChart) OR A paper copy in the mail If you have any lab test that is abnormal or we need to change your treatment, we will call you to review the results.   Testing/Procedures: [place] Sleep Study Your physician has recommended that you have a sleep study. This test records several body functions during sleep, including: brain activity, eye movement, oxygen and carbon dioxide blood levels, heart rate and rhythm, breathing rate and rhythm, the flow of air through your mouth and nose, snoring, body muscle movements, and chest and belly movement.  Cardiac CT - someone from the hospital will contact you to set this up Your physician has requested that you have cardiac CT. Cardiac computed tomography (CT) is a painless test that uses an x-ray machine to take clear, detailed pictures of your heart. For further information please visit [email or web address] Please follow instruction sheet as given.  Atrial Fibrillation Ablation - This is scheduled for Monday, March 24 Your physician has recommended that you have an ablation. Catheter ablation is a medical procedure used to treat some cardiac arrhythmias (irregular heartbeats). During catheter ablation, a long, thin, flexible tube is put into a blood vessel in your groin (upper thigh), or neck. This tube is called an ablation catheter. It is then guided to your heart through the blood  vessel. Radio frequency waves destroy small areas of heart tissue where abnormal heartbeats may cause an arrhythmia to start. Please see the instruction sheet given to you today.   Follow-Up: At [facility], you and your health needs are our priority.  As part of our continuing mission to provide you with exceptional heart care, we have created designated Provider Care Teams.  These Care Teams include your primary Cardiologist (physician) and Advanced Practice Providers (APPs -  Physician Assistants and Nurse Practitioners) who all work together to provide you with the care you need, when you need it.  We recommend signing up for the patient portal called "MyChart".  Sign up information is provided on this After Visit Summary.  MyChart is used to connect with patients for Virtual Visits (Telemedicine).  Patients are able to view lab/test results, encounter notes, upcoming appointments, etc.  Non-urgent messages can be sent to your provider as well.   To learn more about what you can do with MyChart, go to [email or web address].    Your next appointment:   We will schedule follow up after your ablation   Provider:   York Pellant, MD

## 2023-04-13 NOTE — Telephone Encounter (Signed)
DR. Nelly Laurence ORDERED ITAMAR STUDY.   Patient agreement reviewed and signed on 04/13/2023.  WatchPAT issued to patient on 04/13/2023 by Danielle Rankin, CMA. Patient aware to not open the WatchPAT box until contacted with the activation PIN. Patient profile initialized in CloudPAT on 04/13/2023 by Danielle Rankin, CMA. Device serial number: [id number]  Please list Reason for Call as Advice Only and type "WatchPAT issued to patient" in the comment box.

## 2023-04-16 DIAGNOSIS — J209 Acute bronchitis, unspecified: Secondary | ICD-10-CM | POA: Diagnosis not present

## 2023-04-16 DIAGNOSIS — J449 Chronic obstructive pulmonary disease, unspecified: Secondary | ICD-10-CM | POA: Diagnosis not present

## 2023-04-16 DIAGNOSIS — J440 Chronic obstructive pulmonary disease with acute lower respiratory infection: Secondary | ICD-10-CM | POA: Diagnosis not present

## 2023-04-23 ENCOUNTER — Encounter: Payer: Self-pay | Admitting: Nurse Practitioner

## 2023-04-23 ENCOUNTER — Ambulatory Visit: Payer: 59 | Attending: Nurse Practitioner | Admitting: Nurse Practitioner

## 2023-04-23 VITALS — BP 122/70 | HR 77 | Ht 63.0 in | Wt 197.0 lb

## 2023-04-23 DIAGNOSIS — I4892 Unspecified atrial flutter: Secondary | ICD-10-CM | POA: Diagnosis not present

## 2023-04-23 DIAGNOSIS — I4891 Unspecified atrial fibrillation: Secondary | ICD-10-CM | POA: Diagnosis not present

## 2023-04-23 DIAGNOSIS — R0989 Other specified symptoms and signs involving the circulatory and respiratory systems: Secondary | ICD-10-CM

## 2023-04-23 DIAGNOSIS — I10 Essential (primary) hypertension: Secondary | ICD-10-CM | POA: Diagnosis not present

## 2023-04-23 DIAGNOSIS — J449 Chronic obstructive pulmonary disease, unspecified: Secondary | ICD-10-CM

## 2023-04-23 NOTE — Progress Notes (Signed)
 Cardiology Office Note:  .   Date:  04/23/2023 ID:  [name], DOB 1950/04/04, MRN [id number] PCP: Rosamond Leta NOVAK, MD  [facility] Providers Cardiologist:  [name], MD Electrophysiologist:  [name], MD    History of Present Illness: .   [name] is a 73 y.o. female with a PMH of A-flutter, frequent PVCs, hypertension, Barrett's esophagus, GERD, IBS, hepatitis C, s/p treatment in remission, obesity, depression/anxiety, COPD, hypothyroidism, who presents today for follow-up.    She was newly diagnosed with new onset A-flutter while presenting to facility for scheduled EGD, previous ZIO monitor confirmed A-fib/flutter with heart rate in 50s -see full report below.  Last seen by Tylene Lunch, NP on December 30, 2022.  Patient noted worsening shortness of breath, fatigue, and sleepiness.  Her previous EGD was canceled due to new onset A-fib.  EKG in office that day revealed a flutter with varying AV block, was found to be rate controlled on beta-blocker therapy.  In office that day she was started on Eliquis  5 mg twice daily for CHA2DS2-VASc score is 3, aspirin  was discontinued.  Medication compliance was discussed with patient and family and to be scheduled for DCCV if she remained in A-fib at 3 to 4 weeks.  Echocardiogram was arranged for her shortness of breath/DOE-LVEF 65 to 70%, mitral valve was found to have trivial leakage, no other valve abnormalities noted.  Study was reassuring.  01/30/2023 - Today she presents for 4 week follow-up. She continues to endorse similar/stable shortness of breath and fatigue, says there has been no change in her symptoms. Reports she had an episode of chest pain last week, says it feels like I got heartburn. Difficult for patient to describe. Says she can feel her A-fib, and that is when her chest hurts. Pt has fallen twice since last office visit, denies any acute injuries, says she bruised her right knee. Denies any syncope,  presyncope, dizziness, orthopnea, PND, swelling or significant weight changes, acute bleeding, or claudication.   She underwent cardioversion on February 06, 2023.    02/26/2023 - Today she presents for follow-up.  She says after cardioversion when she was out of A-fib she felt better, however now she feels tired, lacks energy and believes she is back in A-fib. Denies any chest pain, palpitations, syncope, presyncope, dizziness, orthopnea, PND, swelling or significant weight changes, acute bleeding, or claudication.  Continues to note similar/stable shortness of breath, similar to last office visit.  She admits to jitteriness, anxiety that she wonders is related to one of her cardiac medications.  She has not had any falls, but admits to some near falls.  Says she has recently been having anxiety/stuttering, says she has never experienced stuttering before.  Visited ED on 03/16/2023 after having multiple falls, attributed to her right knee giving out. CT imaging negative for anything acute. Was tx for cellulitis. Advised about how to treat her HA.    Saw Dr. Furbish on 04/13/2023. Was scheduled for A-fib ablation.   Today she presents for follow-up. Recently getting over the flu. Has been dealing with mid chest congestion, denies any chest pain. She has been taking antibiotic that was prescribed by PCP, has not completed therapy yet. Denies any palpitations, syncope, presyncope, dizziness, orthopnea, PND, swelling or significant weight changes, acute bleeding, or claudication. Does admit to some shortness of breath related to her recent illness, denies any red flag signs/symptoms.   ROS: Negative. See HPI.   Studies Reviewed: .  EKG:  EKG Interpretation Date/Time:  Thursday April 23 2023 10:51:37 EST Ventricular Rate:  79 PR Interval:    QRS Duration:  66 QT Interval:  310 QTC Calculation: 355 R Axis:   47  Text Interpretation: Atrial flutter with variable A-V block Low voltage QRS  Septal infarct , age undetermined When compared with ECG of 13-Apr-2023 10:39, QRS voltage has decreased Septal infarct is now Present Nonspecific T wave abnormality no longer evident in Inferior leads QT has shortened Confirmed by Miriam Norris [id number]) on 04/23/2023 10:53:48 AM   Echo 01/2023:   1. Left ventricular ejection fraction, by estimation, is 65 to 70%. The  left ventricle has normal function. The left ventricle has no regional  wall motion abnormalities. There is mild concentric left ventricular  hypertrophy. Left ventricular diastolic  parameters are indeterminate.   2. Right ventricular systolic function is low normal. The right  ventricular size is normal. There is normal pulmonary artery systolic  pressure. The estimated right ventricular systolic pressure is 24.0 mmHg.   3. Left atrial size was severely dilated.   4. Right atrial size was severely dilated.   5. The mitral valve is degenerative. Trivial mitral valve regurgitation.   6. The aortic valve is tricuspid. There is mild calcification of the  aortic valve. Aortic valve regurgitation is not visualized.   7. The inferior vena cava is normal in size with greater than 50%  respiratory variability, suggesting right atrial pressure of 3 mmHg.   Comparison(s): No significant change from prior study. Prior images  reviewed side by side.  Cardiac monitor 12/2022:  Predominant rhythm is coarse atrial fibrillation/flutter with heart rate ranging from 38 bpm up to 146 bpm and average heart rate 58 bpm. Frequent PVCs were noted representing 19.8% total beats with otherwise rare ventricular couplets and triplets and limited episodes of ventricular bigeminy and trigeminy.  Single, 6 beat burst of NSVT was noted.  There were no sustained arrhythmias. No pauses or high degree heart block.  Lexiscan  01/2019:  There was no ST segment deviation noted during stress. Normal myocardial perfusion. Extensive soft tissue attenuation  noted. This is an intermediate risk study, primarily based on reduced LVEF. However, LV function appears grossly normal and the reduced LVEF may be due to gating artifact. If echocardiogram is performed and LV systolic function is found to be normal, this would be considered a low risk study. Nuclear stress EF: 37%. Risk Assessment/Calculations:    CHA2DS2-VASc Score = 3   This indicates a 3.2% annual risk of stroke. The patient's score is based upon: CHF History: 0 HTN History: 1 Diabetes History: 0 Stroke History: 0 Vascular Disease History: 0 Age Score: 1 Gender Score: 1   Physical Exam:   VS:  BP 122/70   Pulse 77   Ht 5' 3 (1.6 m)   Wt 197 lb (89.4 kg)   SpO2 98%   BMI 34.90 kg/m    Wt Readings from Last 3 Encounters:  04/23/23 197 lb (89.4 kg)  04/13/23 195 lb 12.8 oz (88.8 kg)  03/16/23 201 lb (91.2 kg)    GEN: Obese, 73 y.o. female in no acute distress NECK: No JVD; No carotid bruits CARDIAC: S1/S2, irregularly irregular rhythm, no murmurs, rubs, gallops RESPIRATORY:  Clear to auscultation without rales, wheezing or rhonchi  ABDOMEN: Soft, non-tender, non-distended EXTREMITIES:  No edema; No deformity   ASSESSMENT AND PLAN: .    A-fib/A-flutter Remains in A-flutter, denies any palpitations or tachycardia. Continue current medication regimen.  Continue Eliquis  due to CHA2DS2-VASc score 3.  She is on appropriate dosage and denies any bleeding issues. Scheduled for ablation next month. Continue to follow-up with EP.   2. HTN Blood pressure stable. Discussed to monitor BP at home at least 2 hours after medications and sitting for 5-10 minutes.  No medication changes at this time.    3. COPD, chest congestion/URI? Does admit to some shortness of breath and chest congestion related to recent illness, being managed by PCP. EKG is negative for anything acute. Recommended to f/u with PCP who is managing this. Continue follow-up PCP.  No medication changes at this time.    Dispo: Follow-up with me/APP in 4-6 months or sooner if anything changes.   Signed, Almarie Crate, NP

## 2023-04-23 NOTE — Patient Instructions (Addendum)
 Medication Instructions:  Your physician recommends that you continue on your current medications as directed. Please refer to the Current Medication list given to you today.  Labwork: None   Testing/Procedures: None   Follow-Up: Your physician recommends that you schedule a follow-up appointment in: 4-6 months   Any Other Special Instructions Will Be Listed Below (If Applicable).  If you need a refill on your cardiac medications before your next appointment, please call your pharmacy.

## 2023-04-25 ENCOUNTER — Other Ambulatory Visit (HOSPITAL_COMMUNITY): Payer: Self-pay | Admitting: Psychiatry

## 2023-04-27 ENCOUNTER — Ambulatory Visit (INDEPENDENT_AMBULATORY_CARE_PROVIDER_SITE_OTHER): Payer: 59 | Admitting: Gastroenterology

## 2023-04-27 ENCOUNTER — Encounter (INDEPENDENT_AMBULATORY_CARE_PROVIDER_SITE_OTHER): Payer: Self-pay | Admitting: Gastroenterology

## 2023-04-27 VITALS — BP 118/73 | HR 78 | Temp 97.6°F | Ht 63.0 in | Wt 199.9 lb

## 2023-04-27 DIAGNOSIS — K7460 Unspecified cirrhosis of liver: Secondary | ICD-10-CM | POA: Diagnosis not present

## 2023-04-27 DIAGNOSIS — K219 Gastro-esophageal reflux disease without esophagitis: Secondary | ICD-10-CM

## 2023-04-27 DIAGNOSIS — K7581 Nonalcoholic steatohepatitis (NASH): Secondary | ICD-10-CM | POA: Diagnosis not present

## 2023-04-27 DIAGNOSIS — K581 Irritable bowel syndrome with constipation: Secondary | ICD-10-CM

## 2023-04-27 NOTE — Progress Notes (Addendum)
Referring Provider: Ignatius Specking, MD Primary Care Physician:  Ignatius Specking, MD Primary GI Physician: Dr. Levon Hedger   Chief Complaint  Patient presents with   Gastroesophageal Reflux    Follow up on GERD. States having some reflux. Had to cancel last EGD due to cardiac issues. States she is in afib and having procedure for heart march 24th.    Irritable Bowel Syndrome    Follow up on IBS. States she has been doing well til last nighta t super bowel party. Ate too much cheese.    HPI:   [name] is a 73 y.o. female with past medical history of Hep C s/p treatment, in remission, anxiety, IBS-C, RA, COPD, GERD, opioid induced constipation, HTN, hypothyroidism, SVT, recurrent diverticulitis s/p partial colectomy and NASH Cirrhosis   Patient presenting today for follow up of GERD, IBS and NASH Cirrhosis   Last seen May 2024, at that time well-controlled sometimes versus others.  Noting her heartburn at night when she is lying flat.  Having some coughing when this occurs.  Try to elevate head of bed without much improvement.  Take omeprazole 40 mg daily.  Constipation well-managed on Linzess 290 mcg maybe once per week.  Usually having a BM once daily.  No swelling to her legs or abdomen, episodes of confusion itching or jaundice.  Recommend to increase omeprazole 40 mg to twice daily, good reflux precautions, MELD labs, AFP, CBC, right upper quadrant ultrasound, schedule EGD for esophageal varices surveillance, continue Linzess to 90 mcg as needed  EGD done in July 2024 as outliend below, was to have repeat in September but was in new onset a flutter and procedure was cancelled.   Present: States that due to atrial flutter she had an attempt at cardioversion but this was unsuccessful therefore she will be undergoing an ablation in March.  She is taking linzess PRN, usually 1 time per week, sometimes less, having a BM usually daily. She is trying to watch what she eats, did eat too much  cheese last night at a superbowl gathering which gave her some constipation but otherwise constipation well managed. No rectal bleeding or melena. She has some occasional LLQ pain which usually improves with defecation.   GERD not well controlled. Notes symptoms almost daily with heartburn and acid regurgitation, she is taking Omeprazole 40mg  BID. Denies dysphagia or odynophagia.    Previous MELD 3.0: 10 in September 2024   Cirrhosis related questions: Episodes of confusion/disorientation: no  Jaundice/pruritus: none  Taking diuretics? No  Beta blockers? No  Prior history of variceal banding? no Prior episodes of SBP? no Last liver imaging: June 2024 Increased hepatic parenchymal echogenicity suggestive of steatosis. No cholelithiasis or sonographic evidence for acute cholecystitis.Common bile duct is mildly dilated measuring 7 mm. This was evaluated on prior MRI 09/04/2021 and stable when compared to prior exams. Alcohol use: no Last AFP: 09/2021 7.6     MRCP 09/05/21  Diffuse hepatic steatosis with relative hypertrophy of the lateral segment of left hepatic lobe and squaring of the caudate lobe. The liver contour appears slightly irregular. Imaging findings may be seen in the setting of early cirrhosis. Fusiform dilatation of the common bile duct which measures up to 8 mm. Not significantly changed when compared with CT from 09/25/2020. No signs of choledocholithiasis or obstructing mass identified. Marked splenomegaly and increased upper abdominal varicosities compatible with stigmata of portal venous hypertension. Gastric emptying study August 2022: normal Last Colonoscopy:11/13/20- A single non-bleeding colonic angiodysplastic  lesion. - Diverticulosis in the sigmoid colon. - Non-bleeding external internal hemorrhoids. - No specimens collected.  Last Endoscopy:09/2022 grade 2 esophageal varices, banded and completely eradicated, portal hypertensive gastropathy, normal examined duodenum, no  specimens collected   Recommendations:  Reschedule EGD for EV screening after ablation/cardiac clearance  Repeat colonoscopy august 2032   Past Medical History:  Diagnosis Date   Anxiety    Arthritis    Bronchitis    COPD (chronic obstructive pulmonary disease) (HCC)    Depression    Diverticulitis 04/2012   [facility]   Essential hypertension    Frequent PVCs    Outflow tract, positive in the inferior leads   GERD (gastroesophageal reflux disease)    Hepatitis C    Treated   History of pneumonia    Insomnia    Migraine headache    NASH (nonalcoholic steatohepatitis)    Sleep apnea    Splenomegaly    Thyroid disease    Wears glasses     Past Surgical History:  Procedure Laterality Date   ABDOMINAL HYSTERECTOMY     ANKLE SURGERY Right    BIOPSY  10/05/2020   Procedure: BIOPSY;  Surgeon: Dolores Frame, MD;  [facility]: [facility];  Service: Gastroenterology;;   CARDIOVERSION N/A 02/06/2023   Procedure: CARDIOVERSION;  Surgeon: Marjo Bicker, MD;  Location: [facility];  Service: Cardiovascular;  Laterality: N/A;   COLON SURGERY     "part of color removed"   COLONOSCOPY     COLONOSCOPY WITH PROPOFOL N/A 11/13/2020   Castaneda:single non bleeding colonic angiodysplastic lesion, diverticulosis in sigmoid, non bleeding external hemorrhoids, no specimens   ESOPHAGEAL BANDING  09/16/2022   Procedure: ESOPHAGEAL BANDING;  Surgeon: Dolores Frame, MD;  Location: [facility];  Service: Gastroenterology;;   ESOPHAGEAL MANOMETRY N/A 04/10/2014   Procedure: ESOPHAGEAL MANOMETRY (EM);  Surgeon: Iva Boop, MD;  Location: [facility];  Service: Endoscopy;  Laterality: N/A;   ESOPHAGOGASTRODUODENOSCOPY     ESOPHAGOGASTRODUODENOSCOPY (EGD) WITH PROPOFOL N/A 10/05/2020   Castaneda: barrett's esophagus, short segment, 1cm hh, normal stomach, normal duodenum, biopsied, normal, no varices   ESOPHAGOGASTRODUODENOSCOPY (EGD) WITH PROPOFOL N/A  09/05/2022   Procedure: ESOPHAGOGASTRODUODENOSCOPY (EGD) WITH PROPOFOL;  Surgeon: Dolores Frame, MD;  [facility];  Service: Gastroenterology;  Laterality: N/A;  2:00PM;ASA 3   ESOPHAGOGASTRODUODENOSCOPY (EGD) WITH PROPOFOL N/A 09/16/2022   Procedure: ESOPHAGOGASTRODUODENOSCOPY (EGD) WITH PROPOFOL;  Surgeon: Dolores Frame, MD;  [facility]: [facility];  Service: Gastroenterology;  Laterality: N/A;  10:00am;asa 3   INCISIONAL HERNIA REPAIR N/A 10/24/2015   Procedure: LAPAROSCOPIC REPAIR INCISIONAL HERNIA WITH MESH;  Surgeon: Violeta Gelinas, MD;  Location: [facility] OR;  Service: General;  Laterality: N/A;   INSERTION OF MESH N/A 10/24/2015   Procedure: INSERTION OF MESH;  Surgeon: Violeta Gelinas, MD;  Location: [facility] OR;  Service: General;  Laterality: N/A;   JOINT REPLACEMENT     KNEE SURGERY Right    x4   PH IMPEDANCE STUDY N/A 04/10/2014   Procedure: PH IMPEDANCE STUDY;  Surgeon: Iva Boop, MD;  Location: [facility];  Service: Endoscopy;  Laterality: N/A;   TOTAL SHOULDER ARTHROPLASTY Left 04/22/2018   Procedure: Left total shoulder arthroplasty;  Surgeon: Francena Hanly, MD;  Location: [facility];  Service: Orthopedics;  Laterality: Left;     Current Outpatient Medications  Medication Sig Dispense Refill   albuterol (PROVENTIL HFA;VENTOLIN HFA) 108 (90 Base) MCG/ACT inhaler Inhale 2 puffs into the lungs every 6 (six)  hours as needed for wheezing or shortness of breath.     ALPRAZolam (XANAX) 1 MG tablet Take 1 tablet (1 mg total) by mouth 4 (four) times daily. 120 tablet 3   amitriptyline (ELAVIL) 10 MG tablet Take 1 tablet (10 mg total) by mouth at bedtime. 90 tablet 3   apixaban (ELIQUIS) 5 MG TABS tablet Take 1 tablet (5 mg total) by mouth 2 (two) times daily. 60 tablet 6   Ascorbic Acid (VITAMIN C) 1000 MG tablet Take 1,000 mg by mouth daily.     cyanocobalamin 1000 MCG tablet Take 2,000 mcg by mouth daily.     ELDERBERRY PO Take 1 each by mouth daily.      FLUoxetine (PROZAC) 40 MG capsule Take 1 capsule (40 mg total) by mouth daily. 30 capsule 3   Fluticasone-Umeclidin-Vilant 100-62.5-25 MCG/INH AEPB Inhale 1 puff into the lungs daily as needed (asthma).     gabapentin (NEURONTIN) 300 MG capsule Take 300 mg by mouth 3 (three) times daily as needed (pain).     linaclotide (LINZESS) 290 MCG CAPS capsule TAKE ONE CAPSULE BY MOUTH ONCE DAILY BEFORE BREAKFAST. (Patient taking differently: Take 290 mcg by mouth daily as needed (constipation).) 90 capsule 0   loratadine (CLARITIN) 10 MG tablet Take 1 tablet by mouth daily.     metoCLOPramide (REGLAN) 5 MG tablet Take 1 tablet (5 mg total) by mouth every 8 (eight) hours as needed for nausea (headache). 20 tablet 0   Multiple Vitamin (MULTI-VITAMIN PO) Take 1 tablet by mouth daily.     nystatin (MYCOSTATIN) powder Apply 1 g topically daily as needed (rash).  2   omeprazole (PRILOSEC) 40 MG capsule TAKE 1 CAPSULE BY MOUTH TWICE DAILY 120 capsule 5   polyethylene glycol (MIRALAX) 17 g packet Take 17 g by mouth daily.     POTASSIUM PO Take 1 tablet by mouth daily.     rosuvastatin (CRESTOR) 10 MG tablet Take 1 tablet (10 mg total) by mouth daily. 90 tablet 1   Simethicone (GAS RELIEF PO) Take 2 capsules by mouth as needed (flatulence).      temazepam (RESTORIL) 30 MG capsule Take 1 capsule (30 mg total) by mouth at bedtime as needed. for sleep (Patient taking differently: Take 30 mg by mouth at bedtime. for sleep) 30 capsule 3   traMADol (ULTRAM) 50 MG tablet Take 50 mg by mouth 3 (three) times daily as needed for severe pain.     No current facility-administered medications for this visit.    Allergies as of 04/27/2023 - Review Complete 04/27/2023  Allergen Reaction Noted   Acetaminophen Other (See Comments) 10/18/2015   Levothyroxine Hives and Itching 11/25/2013   Nabumetone Rash 10/11/2019    Family History  Problem Relation Age of Onset   Alcohol abuse Father    Dementia Father    Dementia  Maternal Grandmother    Bipolar disorder Daughter    Anxiety disorder Daughter    Depression Brother    Drug abuse Brother    Alcohol abuse Paternal Uncle    Depression Paternal Uncle    ADD / ADHD Neg Hx     Social History   Socioeconomic History   Marital status: Widowed    Spouse name: Not on file   Number of children: 2   Years of education: 12th grade   Highest education level: Not on file  Occupational History   Occupation: RETIRED  Tobacco Use   Smoking status: Never    Passive exposure:  Current   Smokeless tobacco: Never  Vaping Use   Vaping status: Never Used  Substance and Sexual Activity   Alcohol use: Yes    Alcohol/week: 3.0 standard drinks of alcohol    Types: 3 Cans of beer per week    Comment: occasionally   Drug use: No   Sexual activity: Never  Other Topics Concern   Not on file  Social History Narrative   Not on file   Social Drivers of Health   Financial Resource Strain: Not on file  Food Insecurity: Not on file  Transportation Needs: Not on file  Physical Activity: Inactive (10/11/2019)   [facility]   Exercise Vital Sign    Days of Exercise per Week: 0 days    Minutes of Exercise per Session: 0 min  Stress: Not on file  Social Connections: Not on file    Review of systems General: negative for malaise, night sweats, fever, chills, weight loss Neck: Negative for lumps, goiter, pain and significant neck swelling Resp: Negative for cough, wheezing, dyspnea at rest CV: Negative for chest pain, leg swelling, palpitations, orthopnea GI: denies melena, hematochezia, nausea, vomiting, diarrhea, constipation, dysphagia, odyonophagia, early satiety or unintentional weight loss. +GERD  MSK: Negative for joint pain or swelling, back pain, and muscle pain. Derm: Negative for itching or rash Psych: Denies depression, anxiety, memory loss, confusion. No homicidal or suicidal ideation.  Heme: Negative for prolonged  bleeding, bruising easily, and swollen nodes. Endocrine: Negative for cold or heat intolerance, polyuria, polydipsia and goiter. Neuro: negative for tremor, gait imbalance, syncope and seizures. The remainder of the review of systems is noncontributory.  Physical Exam: BP 118/73   Pulse 78   Temp 97.6 F (36.4 C)   Ht 5\' 3"  (1.6 m)   Wt 199 lb 14.4 oz (90.7 kg)   BMI 35.41 kg/m  General:   Alert and oriented. No distress noted. Pleasant and cooperative.  Head:  Normocephalic and atraumatic. Eyes:  Conjuctiva clear without scleral icterus. Mouth:  Oral mucosa pink and moist. Good dentition. No lesions. Heart: Normal rate and rhythm, s1 and s2 heart sounds present.  Lungs: Clear lung sounds in all lobes. Respirations equal and unlabored. Abdomen:  +BS, soft, non-tender and non-distended. No rebound or guarding. No HSM or masses noted. Derm: No palmar erythema or jaundice Msk:  Symmetrical without gross deformities. Normal posture. Extremities:  Without edema. Neurologic:  Alert and  oriented x4 Psych:  Alert and cooperative. Normal mood and affect.  Invalid input(s): "6 MONTHS"   ASSESSMENT: [name] is a 74 y.o. female presenting today for follow up of GERD, IBS-C and NASH cirrhosis  GERD: Currently on omeprazole 40 mg twice daily, patient does not feel that GERD symptoms are well-managed at this time as she is noticing symptoms almost daily.  We discussed change in PPI therapy though at this time patient declined and does not want to switch to anything other than omeprazole.  I did discuss the importance of her GERD being well-managed and encouraged her to let me know if she decides she would like to switch PPI therapy.  IBS-C: IBS is mostly well-managed with the use of Linzess which she takes usually once per week, missed her bowels almost daily.  Has some occasional left lower quadrant pain that usually improves with defecation.  She denies any rectal bleeding or melena.   Will continue with current regimen for now  NASH Cirrhosis: Previously with NASH with  advanced fibrosis confirmed via liver biopsy in 2022, she was maintained with MELD labs, AFP and HCC screening every 6 months given advanced fibrosis.  She underwent EGD in July 2024 which showed grade 2 esophageal varices,  indicating likely progression to cirrhosis.  She has remained well compensated.  Patient was due for repeat EGD for evaluation of banded varices in September though she presented with new onset atrial flutter and procedure was canceled.  She is set up for cardiac ablation in March, will need to pursue repeat upper endoscopy thereafter once cardiac clearance has been obtained.  At this time she is due for [facility] screening via ultrasound as well as MELD labs, AFP, INR.   PLAN:  -MELD labs, AFP, INR  -RUQ [place] for HCC screening  -Schedule Repeat EGD after abalation in March(will need cards clearance) -Continue linzess PRN -Continue omeprazole for now, patient to let me know if she wants to switch PPI  - Reduce salt intake to <2 g per day - Can take Tylenol max of 2 g per day (650 mg q8h) for pain - Avoid NSAIDs for pain - Avoid eating raw oysters/shellfish - Ensure every night before going to sleep  All questions were answered, patient verbalized understanding and is in agreement with plan as outlined above.   Follow Up: 6 months   Mallisa Alameda L. Jeanmarie Hubert, MSN, APRN, AGNP-C Adult-Gerontology Nurse Practitioner [facility]  I have reviewed the note and agree with the APP's assessment as described in this progress note  Katrinka Blazing, MD Gastroenterology and Hepatology [facility]

## 2023-04-27 NOTE — Patient Instructions (Signed)
-  We will update US  in regards to your liver -I will also update some labs, you can have these done at time of other labs you are doing -continue with linzess  as needed -continue omeprazole  40mg  twice daily, let me know if you wish to change to a different reflux medication - Reduce salt intake to <2 g per day - Can take Tylenol  max of 2 g per day (650 mg q8h) for pain - Avoid NSAIDs for pain - Avoid eating raw oysters/shellfish - Ensure every night before going to sleep  Follow up 6 months

## 2023-04-27 NOTE — Telephone Encounter (Signed)
 Call for appt

## 2023-05-08 DIAGNOSIS — I4892 Unspecified atrial flutter: Secondary | ICD-10-CM | POA: Diagnosis not present

## 2023-05-08 DIAGNOSIS — I493 Ventricular premature depolarization: Secondary | ICD-10-CM | POA: Diagnosis not present

## 2023-05-08 DIAGNOSIS — K7581 Nonalcoholic steatohepatitis (NASH): Secondary | ICD-10-CM | POA: Diagnosis not present

## 2023-05-08 DIAGNOSIS — K7460 Unspecified cirrhosis of liver: Secondary | ICD-10-CM | POA: Diagnosis not present

## 2023-05-08 DIAGNOSIS — I4891 Unspecified atrial fibrillation: Secondary | ICD-10-CM | POA: Diagnosis not present

## 2023-05-09 LAB — BASIC METABOLIC PANEL
BUN/Creatinine Ratio: 13 (ref 12–28)
BUN: 10 mg/dL (ref 8–27)
CO2: 21 mmol/L (ref 20–29)
Calcium: 9 mg/dL (ref 8.7–10.3)
Chloride: 106 mmol/L (ref 96–106)
Creatinine, Ser: 0.79 mg/dL (ref 0.57–1.00)
Glucose: 108 mg/dL — ABNORMAL HIGH (ref 70–99)
Potassium: 3.9 mmol/L (ref 3.5–5.2)
Sodium: 142 mmol/L (ref 134–144)
eGFR: 79 mL/min/{1.73_m2} (ref >59)

## 2023-05-09 LAB — PROTIME-INR
INR: 1.1 (ref 0.9–1.2)
Prothrombin Time: 12.3 s — ABNORMAL HIGH (ref 9.1–12.0)

## 2023-05-09 LAB — HEPATIC FUNCTION PANEL
ALT: 16 [IU]/L (ref 0–32)
AST: 24 [IU]/L (ref 0–40)
Albumin: 3.6 g/dL — ABNORMAL LOW (ref 3.8–4.8)
Alkaline Phosphatase: 126 [IU]/L — ABNORMAL HIGH (ref 44–121)
Bilirubin Total: 0.5 mg/dL (ref 0.0–1.2)
Bilirubin, Direct: 0.22 mg/dL (ref 0.00–0.40)
Total Protein: 6.3 g/dL (ref 6.0–8.5)

## 2023-05-09 LAB — AFP TUMOR MARKER: AFP, Serum, Tumor Marker: 4.5 ng/mL (ref 0.0–9.2)

## 2023-05-11 ENCOUNTER — Encounter (INDEPENDENT_AMBULATORY_CARE_PROVIDER_SITE_OTHER): Payer: Self-pay

## 2023-05-11 LAB — CBC
Hematocrit: 36.7 % (ref 34.0–46.6)
Hemoglobin: 12.5 g/dL (ref 11.1–15.9)
MCH: 32.6 pg (ref 26.6–33.0)
MCHC: 34.1 g/dL (ref 31.5–35.7)
MCV: 96 fL (ref 79–97)
Platelets: 52 10*3/uL — CL (ref 150–450)
RBC: 3.83 x10E6/uL (ref 3.77–5.28)
RDW: 15.7 % — ABNORMAL HIGH (ref 11.7–15.4)
WBC: 3.3 10*3/uL — ABNORMAL LOW (ref 3.4–10.8)

## 2023-05-12 ENCOUNTER — Encounter: Payer: Self-pay | Admitting: Cardiovascular Disease

## 2023-05-12 ENCOUNTER — Telehealth (HOSPITAL_COMMUNITY): Payer: Self-pay

## 2023-05-12 NOTE — Telephone Encounter (Signed)
 Attempted to reach patient to discuss upcoming procedure, no answer. Left VM for patient to return call.

## 2023-05-13 NOTE — Telephone Encounter (Addendum)
 Spoke with patient to complete one month pre-procedure call.     New medical conditions?  Patient reports she was diagnosed and treated for the Flu at the beginning of February and has since recovered.  Recent hospitalizations or surgeries? No Started any new medications? No Patient made aware to contact office to inform of any new medications started. Any changes in activities of daily living? No  Pre-procedure testing scheduled: CT on 05/18/23 and lab work to be repeated on day of procedure due to fall within 30 day window and recheck PLT count per Dr. Nelly Laurence. Patient is aware that if PLT count is <50, procedure may be postponed.   Confirmed patient is taking Eliquis and will continue taking medication before procedure or it may need to be rescheduled.  Confirmed patient is scheduled for Atrial Fibrillation Ablation on Monday, March 24 with Dr. York Pellant. Instructed patient to arrive at the Main Entrance A at [facility]: [address] [place], [place] [address] and check in at Admitting at 10:00 AM  Advised of plan to go home the same day and will only stay overnight if medically necessary. You MUST have a responsible adult to drive you home and MUST be with you the first 24 hours after you arrive home or your procedure could be cancelled.  Patient verbalized understanding to information provided and is agreeable to proceed with procedure.

## 2023-05-14 ENCOUNTER — Encounter: Payer: Self-pay | Admitting: Emergency Medicine

## 2023-05-18 ENCOUNTER — Ambulatory Visit (HOSPITAL_COMMUNITY)
Admission: RE | Admit: 2023-05-18 | Discharge: 2023-05-18 | Disposition: A | Discharge status: Home / Self Care | Payer: 59 | Source: Ambulatory Visit | Attending: Cardiovascular Disease | Admitting: Cardiovascular Disease

## 2023-05-18 ENCOUNTER — Ambulatory Visit (HOSPITAL_COMMUNITY)
Admission: RE | Admit: 2023-05-18 | Discharge: 2023-05-18 | Disposition: A | Discharge status: Home / Self Care | Payer: 59 | Source: Ambulatory Visit | Attending: Gastroenterology

## 2023-05-18 DIAGNOSIS — E8770 Fluid overload, unspecified: Secondary | ICD-10-CM | POA: Diagnosis not present

## 2023-05-18 DIAGNOSIS — K7581 Nonalcoholic steatohepatitis (NASH): Secondary | ICD-10-CM | POA: Insufficient documentation

## 2023-05-18 DIAGNOSIS — I493 Ventricular premature depolarization: Secondary | ICD-10-CM | POA: Insufficient documentation

## 2023-05-18 DIAGNOSIS — I4892 Unspecified atrial flutter: Secondary | ICD-10-CM | POA: Diagnosis not present

## 2023-05-18 DIAGNOSIS — I4891 Unspecified atrial fibrillation: Secondary | ICD-10-CM | POA: Diagnosis not present

## 2023-05-18 DIAGNOSIS — K7460 Unspecified cirrhosis of liver: Secondary | ICD-10-CM

## 2023-05-18 DIAGNOSIS — R188 Other ascites: Secondary | ICD-10-CM | POA: Diagnosis not present

## 2023-05-18 DIAGNOSIS — K769 Liver disease, unspecified: Secondary | ICD-10-CM | POA: Diagnosis not present

## 2023-05-18 MED ORDER — IOHEXOL 350 MG/ML SOLN
75.0000 mL | Freq: Once | INTRAVENOUS | Status: AC | PRN
Start: 2023-05-18 — End: 2023-05-18
  Administered 2023-05-18: 75 mL via INTRAVENOUS

## 2023-05-19 ENCOUNTER — Encounter: Payer: Self-pay | Admitting: Cardiovascular Disease

## 2023-05-25 ENCOUNTER — Telehealth (INDEPENDENT_AMBULATORY_CARE_PROVIDER_SITE_OTHER): Payer: Self-pay | Admitting: *Deleted

## 2023-05-25 ENCOUNTER — Other Ambulatory Visit (INDEPENDENT_AMBULATORY_CARE_PROVIDER_SITE_OTHER): Payer: Self-pay | Admitting: Gastroenterology

## 2023-05-25 MED ORDER — LANSOPRAZOLE 30 MG PO CPDR: 30.0000 mg | DELAYED_RELEASE_CAPSULE | Freq: Two times a day (BID) | ORAL | 1 refills | Status: AC

## 2023-05-25 NOTE — Telephone Encounter (Signed)
 Patient left message, she is returning nurses call - please call [phone]

## 2023-05-26 NOTE — Telephone Encounter (Signed)
 Spoke with the patient on 05/25/2023.

## 2023-06-01 ENCOUNTER — Telehealth (HOSPITAL_COMMUNITY): Payer: Self-pay

## 2023-06-01 NOTE — Telephone Encounter (Signed)
 Call placed to patient to discuss upcoming procedure.   CT: completed.  Labs: repeated on 06/08/23 to evaluate PLT count.   Any recent signs of acute illness or been started on antibiotics? No Any new medications started? No Any medications to hold? No Any missed doses of blood thinner?  No Advised patient to continue taking ANTICOAGULANT: Eliquis (Apixaban) without missing any doses.  Medication instructions:  On the morning of your procedure DO NOT take any medication., including Eliquis or the procedure may be rescheduled. Nothing to eat or drink after midnight prior to your procedure.  Confirmed patient is scheduled for Atrial Fibrillation Ablation on Monday, March 24 with Dr. York Pellant. Instructed patient to arrive at the Main Entrance A at [facility]: [address] [place], [place] [address] and check in at Admitting at 10:00 AM  Advised of plan to go home the same day and will only stay overnight if medically necessary. You MUST have a responsible adult to drive you home and MUST be with you the first 24 hours after you arrive home or your procedure could be cancelled.  Patient verbalized understanding to all instructions provided and agreed to proceed with procedure.

## 2023-06-01 NOTE — Telephone Encounter (Signed)
 Attempted to reach patient to discuss upcoming procedure, no answer. Left VM on home number for patient to return call. Unable to leave message- VM full on cell phone.

## 2023-06-05 ENCOUNTER — Encounter (INDEPENDENT_AMBULATORY_CARE_PROVIDER_SITE_OTHER): Payer: Self-pay | Admitting: *Deleted

## 2023-06-05 NOTE — Pre-Procedure Instructions (Signed)
 Instructed patient on the following items: Arrival time 1000 Nothing to eat or drink after midnight No meds AM of procedure Responsible person to drive you home and stay with you for 24 hrs  Have you missed any doses of anti-coagulant Eliquis- takes twice a day, hasn't missed any doses in last 4 weeks.  Don't take dose morning of procedure.

## 2023-06-08 ENCOUNTER — Ambulatory Visit (HOSPITAL_COMMUNITY)
Admission: RE | Admit: 2023-06-08 | Discharge: 2023-06-08 | Disposition: A | Discharge status: Home / Self Care | Payer: 59 | Attending: Cardiovascular Disease | Admitting: Cardiovascular Disease

## 2023-06-08 ENCOUNTER — Ambulatory Visit (HOSPITAL_COMMUNITY)
Admission: RE | Disposition: A | Discharge status: Home / Self Care | Payer: Self-pay | Source: Home / Self Care | Attending: Cardiovascular Disease

## 2023-06-08 ENCOUNTER — Other Ambulatory Visit: Payer: Self-pay

## 2023-06-08 ENCOUNTER — Ambulatory Visit (HOSPITAL_BASED_OUTPATIENT_CLINIC_OR_DEPARTMENT_OTHER): Admitting: Anesthesiology

## 2023-06-08 ENCOUNTER — Ambulatory Visit (HOSPITAL_COMMUNITY): Admitting: Anesthesiology

## 2023-06-08 DIAGNOSIS — K7460 Unspecified cirrhosis of liver: Secondary | ICD-10-CM | POA: Insufficient documentation

## 2023-06-08 DIAGNOSIS — I10 Essential (primary) hypertension: Secondary | ICD-10-CM

## 2023-06-08 DIAGNOSIS — I4819 Other persistent atrial fibrillation: Secondary | ICD-10-CM

## 2023-06-08 DIAGNOSIS — Z7901 Long term (current) use of anticoagulants: Secondary | ICD-10-CM | POA: Insufficient documentation

## 2023-06-08 DIAGNOSIS — E039 Hypothyroidism, unspecified: Secondary | ICD-10-CM | POA: Insufficient documentation

## 2023-06-08 DIAGNOSIS — D6869 Other thrombophilia: Secondary | ICD-10-CM | POA: Insufficient documentation

## 2023-06-08 DIAGNOSIS — J449 Chronic obstructive pulmonary disease, unspecified: Secondary | ICD-10-CM | POA: Insufficient documentation

## 2023-06-08 DIAGNOSIS — K219 Gastro-esophageal reflux disease without esophagitis: Secondary | ICD-10-CM | POA: Insufficient documentation

## 2023-06-08 DIAGNOSIS — Z79899 Other long term (current) drug therapy: Secondary | ICD-10-CM | POA: Insufficient documentation

## 2023-06-08 DIAGNOSIS — I493 Ventricular premature depolarization: Secondary | ICD-10-CM | POA: Diagnosis not present

## 2023-06-08 DIAGNOSIS — I4892 Unspecified atrial flutter: Secondary | ICD-10-CM | POA: Diagnosis not present

## 2023-06-08 DIAGNOSIS — I483 Typical atrial flutter: Secondary | ICD-10-CM | POA: Insufficient documentation

## 2023-06-08 DIAGNOSIS — I484 Atypical atrial flutter: Secondary | ICD-10-CM

## 2023-06-08 HISTORY — PX: ATRIAL FIBRILLATION ABLATION: EP1191

## 2023-06-08 LAB — BASIC METABOLIC PANEL
Anion gap: 8 (ref 5–15)
BUN: 11 mg/dL (ref 8–23)
CO2: 25 mmol/L (ref 22–32)
Calcium: 9.7 mg/dL (ref 8.9–10.3)
Chloride: 107 mmol/L (ref 98–111)
Creatinine, Ser: 0.72 mg/dL (ref 0.44–1.00)
GFR, Estimated: >60 mL/min (ref >60)
Glucose, Bld: 115 mg/dL — ABNORMAL HIGH (ref 70–99)
Potassium: 3.8 mmol/L (ref 3.5–5.1)
Sodium: 140 mmol/L (ref 135–145)

## 2023-06-08 LAB — POCT ACTIVATED CLOTTING TIME: Activated Clotting Time: 383 s

## 2023-06-08 LAB — CBC
HCT: 38.9 % (ref 36.0–46.0)
Hemoglobin: 13.2 g/dL (ref 12.0–15.0)
MCH: 32.5 pg (ref 26.0–34.0)
MCHC: 33.9 g/dL (ref 30.0–36.0)
MCV: 95.8 fL (ref 80.0–100.0)
Platelets: 53 10*3/uL — ABNORMAL LOW (ref 150–400)
RBC: 4.06 MIL/uL (ref 3.87–5.11)
RDW: 16.1 % — ABNORMAL HIGH (ref 11.5–15.5)
WBC: 3.7 10*3/uL — ABNORMAL LOW (ref 4.0–10.5)
nRBC: 0 % (ref 0.0–0.2)

## 2023-06-08 SURGERY — ATRIAL FIBRILLATION ABLATION
Anesthesia: General

## 2023-06-08 MED ORDER — SODIUM CHLORIDE 0.9% FLUSH: 3.0000 mL | INTRAVENOUS | Status: DC | PRN

## 2023-06-08 MED ORDER — FENTANYL CITRATE (PF) 100 MCG/2ML IJ SOLN
INTRAMUSCULAR | Status: AC
  Filled 2023-06-08: qty 2

## 2023-06-08 MED ORDER — PROTAMINE SULFATE 10 MG/ML IV SOLN
INTRAVENOUS | Status: DC | PRN
  Administered 2023-06-08: 50 mg via INTRAVENOUS

## 2023-06-08 MED ORDER — PHENYLEPHRINE 80 MCG/ML (10ML) SYRINGE FOR IV PUSH (FOR BLOOD PRESSURE SUPPORT)
PREFILLED_SYRINGE | INTRAVENOUS | Status: DC | PRN
  Administered 2023-06-08: 200 ug via INTRAVENOUS

## 2023-06-08 MED ORDER — ACETAMINOPHEN 325 MG PO TABS
650.0000 mg | ORAL_TABLET | ORAL | Status: DC | PRN
  Administered 2023-06-08: 650 mg via ORAL
  Filled 2023-06-08: qty 2

## 2023-06-08 MED ORDER — PROPOFOL 10 MG/ML IV BOLUS
INTRAVENOUS | Status: DC | PRN
  Administered 2023-06-08: 20 mg via INTRAVENOUS
  Administered 2023-06-08: 130 mg via INTRAVENOUS

## 2023-06-08 MED ORDER — SUGAMMADEX SODIUM 200 MG/2ML IV SOLN
INTRAVENOUS | Status: DC | PRN
  Administered 2023-06-08: 200 mg via INTRAVENOUS

## 2023-06-08 MED ORDER — ATROPINE SULFATE 1 MG/ML IV SOLN
INTRAVENOUS | Status: DC | PRN
  Administered 2023-06-08: 1 mg via INTRAVENOUS

## 2023-06-08 MED ORDER — ROCURONIUM BROMIDE 10 MG/ML (PF) SYRINGE
PREFILLED_SYRINGE | INTRAVENOUS | Status: DC | PRN
  Administered 2023-06-08: 60 mg via INTRAVENOUS
  Administered 2023-06-08: 10 mg via INTRAVENOUS

## 2023-06-08 MED ORDER — ATROPINE SULFATE 1 MG/10ML IJ SOSY
PREFILLED_SYRINGE | INTRAMUSCULAR | Status: AC
  Filled 2023-06-08: qty 10

## 2023-06-08 MED ORDER — ONDANSETRON HCL 4 MG/2ML IJ SOLN
INTRAMUSCULAR | Status: DC | PRN
  Administered 2023-06-08: 4 mg via INTRAVENOUS

## 2023-06-08 MED ORDER — NITROGLYCERIN 1 MG/10 ML FOR IR/CATH LAB
INTRA_ARTERIAL | Status: AC
  Filled 2023-06-08: qty 20

## 2023-06-08 MED ORDER — LIDOCAINE 2% (20 MG/ML) 5 ML SYRINGE
INTRAMUSCULAR | Status: DC | PRN
  Administered 2023-06-08: 100 mg via INTRAVENOUS

## 2023-06-08 MED ORDER — PHENYLEPHRINE HCL-NACL 20-0.9 MG/250ML-% IV SOLN
INTRAVENOUS | Status: DC | PRN
Start: 2023-06-08 — End: 2023-06-08
  Administered 2023-06-08: 50 ug/min via INTRAVENOUS

## 2023-06-08 MED ORDER — ONDANSETRON HCL 4 MG/2ML IJ SOLN: 4.0000 mg | Freq: Four times a day (QID) | INTRAMUSCULAR | Status: DC | PRN

## 2023-06-08 MED ORDER — SODIUM CHLORIDE 0.9 % IV SOLN: 250.0000 mL | INTRAVENOUS | Status: DC | PRN

## 2023-06-08 MED ORDER — FENTANYL CITRATE (PF) 250 MCG/5ML IJ SOLN: INTRAMUSCULAR | Status: DC | PRN

## 2023-06-08 MED ORDER — HEPARIN (PORCINE) IN NACL 1000-0.9 UT/500ML-% IV SOLN
INTRAVENOUS | Status: DC | PRN
  Administered 2023-06-08 (×3): 500 mL

## 2023-06-08 MED ORDER — NITROGLYCERIN 1 MG/10 ML FOR IR/CATH LAB
INTRA_ARTERIAL | Status: DC | PRN
  Administered 2023-06-08: 20 mL

## 2023-06-08 MED ORDER — FENTANYL CITRATE (PF) 100 MCG/2ML IJ SOLN
INTRAMUSCULAR | Status: DC | PRN
  Administered 2023-06-08 (×2): 50 ug via INTRAVENOUS

## 2023-06-08 MED ORDER — HEPARIN SODIUM (PORCINE) 1000 UNIT/ML IJ SOLN
INTRAMUSCULAR | Status: DC | PRN
Start: 2023-06-08 — End: 2023-06-08
  Administered 2023-06-08: 16000 [IU] via INTRAVENOUS

## 2023-06-08 MED ORDER — SODIUM CHLORIDE 0.9 % IV SOLN: INTRAVENOUS | Status: DC

## 2023-06-08 MED ORDER — DEXAMETHASONE SODIUM PHOSPHATE 10 MG/ML IJ SOLN
INTRAMUSCULAR | Status: DC | PRN
  Administered 2023-06-08: 10 mg via INTRAVENOUS

## 2023-06-08 SURGICAL SUPPLY — 19 items
BAG SNAP BAND KOVER 36X36 (MISCELLANEOUS) IMPLANT
CABLE PFA RX CATH CONN (CABLE) IMPLANT
CATH FARAWAVE ABLATION 31 (CATHETERS) IMPLANT
CATH OCTARAY 2.0 F 3-3-3-3-3 (CATHETERS) IMPLANT
CATH SOUNDSTAR ECO 8FR (CATHETERS) IMPLANT
CATH WEB BI DIR CSDF CRV REPRO (CATHETERS) IMPLANT
CLOSURE PERCLOSE PROSTYLE (VASCULAR PRODUCTS) IMPLANT
COVER SWIFTLINK CONNECTOR (BAG) ×1 IMPLANT
DILATOR VESSEL 38 20CM 16FR (INTRODUCER) IMPLANT
GUIDEWIRE INQWIRE 1.5J.035X260 (WIRE) IMPLANT
INQWIRE 1.5J .035X260CM (WIRE) ×1 IMPLANT
KIT VERSACROSS CNCT FARADRIVE (KITS) IMPLANT
PACK EP LF (CUSTOM PROCEDURE TRAY) ×1 IMPLANT
PAD DEFIB RADIO PHYSIO CONN (PAD) ×1 IMPLANT
PATCH CARTO3 (PAD) IMPLANT
SHEATH FARADRIVE STEERABLE (SHEATH) IMPLANT
SHEATH PINNACLE 8F 10CM (SHEATH) IMPLANT
SHEATH PINNACLE 9F 10CM (SHEATH) IMPLANT
SHEATH PROBE COVER 6X72 (BAG) IMPLANT

## 2023-06-08 NOTE — H&P (Signed)
 Electrophysiology Office Note:    Date:  06/08/2023   ID:  [name], DOB 03-01-1951, MRN [id number]  PCP:  Ignatius Specking, MD   [facility] Providers Cardiologist:  Nona Dell, MD Electrophysiologist:  Maurice Small, MD     Referring MD: No ref. provider found   History of Present Illness:    [name] is a 73 y.o. female with a medical history significant for atrial flutter, PVCs, hypertension, hepatitis C in remission, cirrhosis, hypothyroidism, COPD, referred for arrhythmia management.     She was initially diagnosed with atrial flutter when presenting for an EGD which was canceled due to the arrhythmia. she underwent DC cardioversion on February 06, 2023.  She reported that she felt better in sinus rhythm  I discussed the use of AI scribe software for clinical note transcription with the patient, who gave verbal consent to proceed.  The patient, with a history of cirrhosis, COPD, and atrial fibrillation, presents for a follow-up visit. She reports feeling her heart beating abnormally for the past three to four years, which she describes as a 'flutter.' The patient has been on blood pressure medication for an unspecified duration. The atrial fibrillation was discovered during an endoscopy appointment, which was subsequently cancelled due to the patient's heart rhythm. The patient underwent a cardioversion in November, but quickly reverted back to atrial fibrillation. She reports feeling more tired than usual and has noticed changes in her activity level, including shortness of breath and increased sleep. The patient also has a cough, which she attributes to a recent virus and her COPD. She is currently taking Eliquis, a blood thinner, without any reported issues. The patient has been tested for sleep apnea in the past, but does not recall the results and has not been on treatment. She has been losing weight, going from 224 to 196 pounds.     Today, she reports  ongoing fatigue. She worked until age 35 and has been very active until these palpitations and fatigue became overwhelming for her.  Now she is essentially sedentary.  I reviewed the patient's CT and labs. There was no LAA thrombus. she  has not missed any doses of anticoagulation, and she took her dose last night. There have been no changes in the patient's diagnoses, medications, or condition since our recent clinic visit.   EKGs/Labs/Other Studies Reviewed Today:     Echocardiogram:  TTE November 2024 EF 60 to 70%.  Severe biatrial dilation.  Trivial mitral regurgitation.   Monitors:  7 day monitor September 2024-- my interpretation 100% atrial fibrillation burden, possibly organizing into flutter: Heart rate 38 to 146 bpm, average 58 bpm 19.8% PVC burden.  PVCs are pleomorphic    EKG:         Physical Exam:    VS:  BP 135/83   Pulse 80   Temp 98.4 F (36.9 C) (Oral)   Resp 17   Ht 5\' 3"  (1.6 m)   Wt 88.9 kg   SpO2 96%   BMI 34.72 kg/m     Wt Readings from Last 3 Encounters:  06/08/23 88.9 kg  04/27/23 90.7 kg  04/23/23 89.4 kg     GEN:  Well nourished, well developed in no acute distress CARDIAC: iRRR, no murmurs, rubs, gallops RESPIRATORY:  Normal work of breathing MUSCULOSKELETAL: no edema    ASSESSMENT & PLAN:     Atrial fibrillation and flutter Persistent, refractory to cardioversion Medical therapy is limited due to cirrhosis She is  very symptomatic with severe fatigue and lack of energy We discussed management options.  I expressed my concern that she is at high risk for recurrence of A-fib due to severe atrial enlargement, what I anticipate is a longer duration of atrial fibrillation, as well as obesity and possibly untreated sleep apnea.  Because her lifestyle has been so severely affected by AF, she would like to proceed with scheduling ablation at this time.  We discussed the indication, rationale, logistics, anticipated benefits, and  potential risks of the ablation procedure including but not limited to -- bleed at the groin access site, chest pain, damage to nearby organs such as the diaphragm, lungs, or esophagus, need for a drainage tube, or prolonged hospitalization. I explained that the risk for stroke, heart attack, need for open chest surgery, or even death is very low but not zero. she  expressed understanding and wishes to proceed.   Secondary hypercoagulable state Continue Eliquis I recommend that she proceed with EGD   Frequent PVCs Polymorphic -- not an ablation candidate      Signed, Maurice Small, MD  06/08/2023 12:20 PM    [facility]

## 2023-06-08 NOTE — Transfer of Care (Signed)
 Immediate Anesthesia Transfer of Care Note  Patient: [name]  Procedure(s) Performed: ATRIAL FIBRILLATION ABLATION  Patient Location: PACU and Cath Lab  Anesthesia Type:General  Level of Consciousness: awake, oriented, and patient cooperative  Airway & Oxygen Therapy: Patient Spontanous Breathing and Patient connected to face mask oxygen  Post-op Assessment: Report given to RN and Post -op Vital signs reviewed and stable  Post vital signs: Reviewed and stable  Last Vitals:  Vitals Value Taken Time  BP 121/75 06/08/23 1526  Temp 36.9 C 06/08/23 1520  Pulse 96 06/08/23 1528  Resp 17 06/08/23 1528  SpO2 94 % 06/08/23 1528  Vitals shown include unfiled device data.  Last Pain:  Vitals:   06/08/23 1520  TempSrc: Axillary  PainSc: 0-No pain         Complications: There were no known notable events for this encounter.

## 2023-06-08 NOTE — Anesthesia Postprocedure Evaluation (Signed)
 Anesthesia Post Note  Patient: [name]  Procedure(s) Performed: ATRIAL FIBRILLATION ABLATION     Patient location during evaluation: PACU Anesthesia Type: General Level of consciousness: awake and alert Pain management: pain level controlled Vital Signs Assessment: post-procedure vital signs reviewed and stable Respiratory status: spontaneous breathing, nonlabored ventilation and respiratory function stable Cardiovascular status: blood pressure returned to baseline Postop Assessment: no apparent nausea or vomiting Anesthetic complications: no   There were no known notable events for this encounter.  Last Vitals:  Vitals:   06/08/23 1552 06/08/23 1555  BP:  (!) 154/88  Pulse:  97  Resp:  15  Temp:    SpO2: 93% 92%    Last Pain:  Vitals:   06/08/23 1604  TempSrc:   PainSc: 0-No pain                 Shanda Howells

## 2023-06-08 NOTE — Anesthesia Procedure Notes (Signed)
 Procedure Name: Intubation Date/Time: 06/08/2023 1:16 PM  Performed by: Orlin Hilding, CRNAPre-anesthesia Checklist: Patient identified, Emergency Drugs available, Suction available, Patient being monitored and Timeout performed Patient Re-evaluated:Patient Re-evaluated prior to induction Oxygen Delivery Method: Circle system utilized Preoxygenation: Pre-oxygenation with 100% oxygen Induction Type: IV induction Ventilation: Mask ventilation without difficulty and Oral airway inserted - appropriate to patient size Laryngoscope Size: Mac and 3 Grade View: Grade I Tube type: Oral Tube size: 7.0 mm Number of attempts: 1 Placement Confirmation: ETT inserted through vocal cords under direct vision, positive ETCO2 and breath sounds checked- equal and bilateral Secured at: 23 cm Tube secured with: Tape Dental Injury: Teeth and Oropharynx as per pre-operative assessment

## 2023-06-08 NOTE — Anesthesia Preprocedure Evaluation (Addendum)
 Anesthesia Evaluation  Patient identified by MRN, date of birth, ID band Patient awake    Reviewed: Allergy & Precautions, NPO status , Patient's Chart, lab work & pertinent test results, reviewed documented beta blocker date and time   History of Anesthesia Complications Negative for: history of anesthetic complications  Airway Mallampati: II  TM Distance: >3 FB Neck ROM: Full    Dental  (+) Edentulous Upper, Missing,    Pulmonary sleep apnea , COPD   Pulmonary exam normal        Cardiovascular hypertension, Pt. on home beta blockers and Pt. on medications Normal cardiovascular exam     Neuro/Psych  Headaches  Anxiety Depression       GI/Hepatic ,GERD  Medicated,,(+) Hepatitis -, C  Endo/Other  negative endocrine ROS    Renal/GU Renal disease  negative genitourinary   Musculoskeletal  (+) Arthritis ,    Abdominal   Peds  Hematology Plts 53k   Anesthesia Other Findings Day of surgery medications reviewed with patient.  Reproductive/Obstetrics                              Anesthesia Physical Anesthesia Plan  ASA: 3  Anesthesia Plan: General   Post-op Pain Management: Minimal or no pain anticipated   Induction: Intravenous  PONV Risk Score and Plan: 3 and Treatment may vary due to age or medical condition, Ondansetron and Dexamethasone  Airway Management Planned: Oral ETT  Additional Equipment: None  Intra-op Plan:   Post-operative Plan: Extubation in OR  Informed Consent: I have reviewed the patients History and Physical, chart, labs and discussed the procedure including the risks, benefits and alternatives for the proposed anesthesia with the patient or authorized representative who has indicated his/her understanding and acceptance.     Dental advisory given  Plan Discussed with: CRNA  Anesthesia Plan Comments:         Anesthesia Quick Evaluation

## 2023-06-08 NOTE — Discharge Instructions (Signed)

## 2023-06-09 ENCOUNTER — Encounter (HOSPITAL_COMMUNITY): Payer: Self-pay | Admitting: Cardiovascular Disease

## 2023-06-09 ENCOUNTER — Telehealth (HOSPITAL_COMMUNITY): Payer: Self-pay

## 2023-06-09 MED FILL — Atropine Sulfate Soln Prefill Syr 1 MG/10ML (0.1 MG/ML): INTRAMUSCULAR | Qty: 10 | Status: AC

## 2023-06-09 NOTE — Telephone Encounter (Signed)
 Attempted to reach patient to follow up with procedure completed on 06/08/23, no answer. Left VM for patient to return call.

## 2023-06-09 NOTE — Telephone Encounter (Signed)
 Spoke with patient to complete post procedure follow up call.  Patient reports no complications with groin sites.   Instructions reviewed with patient:  Remove large bandage at puncture site after 24 hours. It is normal to have bruising, tenderness and a pea or marble sized lump/knot at the groin site which can take up to three months to resolve.  Get help right away if you notice sudden swelling at the puncture site.  Check your puncture site every day for signs of infection: fever, redness, swelling, pus drainage, warmth, foul odor or excessive pain. If this occurs, please call the office at [phone], to speak with the nurse. Get help right away if your puncture site is bleeding and the bleeding does not stop after applying firm pressure to the area.  You may continue to have skipped beats/ atrial fibrillation during the first several months after your procedure.  It is very important not to miss any doses of your blood thinner Eliquis. Patient restarted taking this medication.   You will follow up with the Afib clinic on 07/06/23 and follow up with Dr.Augustus Mealor on 08/21/23.   Patient verbalized understanding to all instructions provided.

## 2023-06-23 ENCOUNTER — Telehealth: Payer: Self-pay

## 2023-06-23 NOTE — Telephone Encounter (Signed)
**Note De-Identified Draven Natter Obfuscation** Ordering provider: Dr Nelly Laurence Associated diagnoses: Fatigue-R53.83 and A-Fib-I48.91  WatchPAT PA obtained on 06/23/2023 by Hanadi Stanly, Lorelle Formosa, LPN. Authorization: Per the [facility] Provider Portal: Prior Authorization/Notification is not required for the requested service(s): CPT Code: 44034 (Itamar-HST). Decision ID #: [id number]  Patient notified of PIN (1234) on 06/23/2023 Vira Chaplin Phone-I got no answer so I left a message on the pts home VM (Ok per [facility]) advising her of the WatchPAT ONE-HST Pin # of "[id number]". I also sent the pt a MYCHART message advising her of the WatchPAT One-HST device Pin #.  Phone note routed to covering staff for follow-up.

## 2023-06-29 ENCOUNTER — Other Ambulatory Visit (HOSPITAL_COMMUNITY): Payer: Self-pay | Admitting: Psychiatry

## 2023-06-29 ENCOUNTER — Telehealth (HOSPITAL_COMMUNITY): Payer: Self-pay

## 2023-06-29 DIAGNOSIS — M545 Low back pain, unspecified: Secondary | ICD-10-CM | POA: Diagnosis not present

## 2023-06-29 DIAGNOSIS — Z299 Encounter for prophylactic measures, unspecified: Secondary | ICD-10-CM | POA: Diagnosis not present

## 2023-06-29 DIAGNOSIS — I4891 Unspecified atrial fibrillation: Secondary | ICD-10-CM | POA: Diagnosis not present

## 2023-06-29 DIAGNOSIS — M7989 Other specified soft tissue disorders: Secondary | ICD-10-CM | POA: Diagnosis not present

## 2023-06-29 DIAGNOSIS — D692 Other nonthrombocytopenic purpura: Secondary | ICD-10-CM | POA: Diagnosis not present

## 2023-06-29 DIAGNOSIS — J309 Allergic rhinitis, unspecified: Secondary | ICD-10-CM | POA: Diagnosis not present

## 2023-06-29 MED ORDER — FLUOXETINE HCL 40 MG PO CAPS: 40.0000 mg | ORAL_CAPSULE | Freq: Every day | ORAL | 3 refills | Status: DC

## 2023-06-29 NOTE — Telephone Encounter (Signed)
 Spoke with pt she states that she is only taking 1 a day due to it making her sick

## 2023-06-29 NOTE — Telephone Encounter (Signed)
 I sent the others in. The prozac is prescribed at one a dya, but she told someone she was taking it 2xs per day. This is a very high dose. Please check with her

## 2023-06-29 NOTE — Telephone Encounter (Signed)
 Pt called in needing a refill on her FLUoxetine (PROZAC) 40 MG capsule as well as xanax and temazepam that pharmacy sent over sent to [facility] pharmacy.. Pt is scheduled for 07/03/23. Please advise.

## 2023-06-29 NOTE — Telephone Encounter (Signed)
 Okay, that is how its prescribed

## 2023-07-03 ENCOUNTER — Telehealth (INDEPENDENT_AMBULATORY_CARE_PROVIDER_SITE_OTHER): Admitting: Psychiatry

## 2023-07-03 ENCOUNTER — Encounter (HOSPITAL_COMMUNITY): Payer: Self-pay | Admitting: Psychiatry

## 2023-07-03 DIAGNOSIS — F331 Major depressive disorder, recurrent, moderate: Secondary | ICD-10-CM | POA: Diagnosis not present

## 2023-07-03 DIAGNOSIS — R109 Unspecified abdominal pain: Secondary | ICD-10-CM

## 2023-07-03 MED ORDER — TEMAZEPAM 30 MG PO CAPS: 30.0000 mg | ORAL_CAPSULE | Freq: Every day | ORAL | 0 refills | Status: DC

## 2023-07-03 MED ORDER — FLUOXETINE HCL 40 MG PO CAPS: 40.0000 mg | ORAL_CAPSULE | Freq: Every day | ORAL | 3 refills | Status: DC

## 2023-07-03 MED ORDER — AMITRIPTYLINE HCL 10 MG PO TABS: 10.0000 mg | ORAL_TABLET | Freq: Every evening | ORAL | 2 refills | Status: DC | PRN

## 2023-07-03 MED ORDER — ALPRAZOLAM 1 MG PO TABS: 1.0000 mg | ORAL_TABLET | Freq: Four times a day (QID) | ORAL | 2 refills | Status: DC

## 2023-07-03 MED ORDER — ARIPIPRAZOLE 2 MG PO TABS: 2.0000 mg | ORAL_TABLET | Freq: Every day | ORAL | 2 refills | Status: DC

## 2023-07-03 NOTE — Progress Notes (Signed)
 Virtual Visit via Video Note  I connected with [name] on 07/03/23 at  9:20 AM EDT by a video enabled telemedicine application and verified that I am speaking with the correct person using two identifiers.  Location: Patient: home Provider: office   I discussed the limitations of evaluation and management by telemedicine and the availability of in person appointments. The patient expressed understanding and agreed to proceed.    I discussed the assessment and treatment plan with the patient. The patient was provided an opportunity to ask questions and all were answered. The patient agreed with the plan and demonstrated an understanding of the instructions.   The patient was advised to call back or seek an in-person evaluation if the symptoms worsen or if the condition fails to improve as anticipated.  I provided 20 minutes of non-face-to-face time during this encounter.   Barnie Gull, MD  [facility] MD/PA/NP OP Progress Note  07/03/2023 9:37 AM [name]  MRN:  [id number]  Chief Complaint:  Chief Complaint  Patient presents with   Anxiety   Depression   Follow-up   HPI: This patient is a 73 year old widowed white female who lives with her daughter in [place]. She is on disability.   The patient returns for follow-up after about 8 months regarding her depression and anxiety.  She has been dealing with a lot of medical issues.  On March 24 she had cardiac ablation for her atrial flutter.  She states that it is taken her a long time to get over the side effects from the procedure.  She is then extremely tired and states that she got quite depressed right afterwards.  She is starting to come out of it.  She denies any thoughts of self-harm or suicide.  However her energy is low.  She has not been eating well and has lost quite a bit of weight by her report she is sleeping okay with her medications.  She asked if we can add an augmentation agent to her Prozac  and I think this is  reasonable. Visit Diagnosis:    ICD-10-CM   1. Major depressive disorder, recurrent episode, moderate (HCC)  F33.1     2. Functional abdominal pain syndrome  R10.9 amitriptyline  (ELAVIL ) 10 MG tablet      Past Psychiatric History: none  Past Medical History:  Past Medical History:  Diagnosis Date   Anxiety    Arthritis    Bronchitis    COPD (chronic obstructive pulmonary disease) (HCC)    Depression    Diverticulitis 04/2012   [facility]   Essential hypertension    Frequent PVCs    Outflow tract, positive in the inferior leads   GERD (gastroesophageal reflux disease)    Hepatitis C    Treated   History of pneumonia    Insomnia    Migraine headache    NASH (nonalcoholic steatohepatitis)    Sleep apnea    Splenomegaly    Thyroid  disease    Wears glasses     Past Surgical History:  Procedure Laterality Date   ABDOMINAL HYSTERECTOMY     ANKLE SURGERY Right    ATRIAL FIBRILLATION ABLATION N/A 06/08/2023   Procedure: ATRIAL FIBRILLATION ABLATION;  Surgeon: Nancey Eulas BRAVO, MD;  [facility]: [facility] INVASIVE CV LAB;  Service: Cardiovascular;  Laterality: N/A;   BIOPSY  10/05/2020   Procedure: BIOPSY;  Surgeon: Eartha Angelia Sieving, MD;  Location: [facility] ENDO SUITE;  Service: Gastroenterology;;   CARDIOVERSION N/A 02/06/2023   Procedure:  CARDIOVERSION;  Surgeon: Stacia Diannah SQUIBB, MD;  Location: [facility] ORS;  Service: Cardiovascular;  Laterality: N/A;   COLON SURGERY     part of color removed   COLONOSCOPY     COLONOSCOPY WITH PROPOFOL  N/A 11/13/2020   Castaneda:single non bleeding colonic angiodysplastic lesion, diverticulosis in sigmoid, non bleeding external hemorrhoids, no specimens   ESOPHAGEAL BANDING  09/16/2022   Procedure: ESOPHAGEAL BANDING;  Surgeon: Eartha Angelia Sieving, MD;  Location: [facility] ENDO SU[facility];  Service: Gastroenterology;;   ESOPHAGEAL MANOMETRY N/A 04/10/2014   Procedure: ESOPHAGEAL MANOMETRY (EM);  Surgeon: Lupita FORBES Commander, MD;  Location: [facility]  ENDOSCOPY;  Service: Endoscopy;  Laterality: N/A;   ESOPHAGOGASTRODUODENOSCOPY     ESOPHAGOGASTRODUODENOSCOPY (EGD) WITH PROPOFOL  N/A 10/05/2020   Castaneda: barrett's esophagus, short segment, 1cm hh, normal stomach, normal duodenum, biopsied, normal, no varices   ESOPHAGOGASTRODUODENOSCOPY (EGD) WITH PROPOFOL  N/A 09/05/2022   Procedure: ESOPHAGOGASTRODUODENOSCOPY (EGD) WITH PROPOFOL ;  Surgeon: Eartha Angelia Sieving, MD;  [facility];  Service: Gastroenterology;  Laterality: N/A;  2:00PM;ASA 3   ESOPHAGOGASTRODUODENOSCOPY (EGD) WITH PROPOFOL  N/A 09/16/2022   Procedure: ESOPHAGOGASTRODUODENOSCOPY (EGD) WITH PROPOFOL ;  Surgeon: Eartha Angelia Sieving, MD;  [facility];  Service: Gastroenterology;  Laterality: N/A;  10:00am;asa 3   INCISIONAL HERNIA REPAIR N/A 10/24/2015   Procedure: LAPAROSCOPIC REPAIR INCISIONAL HERNIA WITH MESH;  Surgeon: Dann Hummer, MD;  Location: [facility] OR;  Service: General;  Laterality: N/A;   INSERTION OF MESH N/A 10/24/2015   Procedure: INSERTION OF MESH;  Surgeon: Dann Hummer, MD;  Location: [facility] OR;  Service: General;  Laterality: N/A;   JOINT REPLACEMENT     KNEE SURGERY Right    x4   PH IMPEDANCE STUDY N/A 04/10/2014   Procedure: PH IMPEDANCE STUDY;  Surgeon: Lupita FORBES Commander, MD;  Location: [facility] ENDOSCOPY;  Service: Endoscopy;  Laterality: N/A;   TOTAL SHOULDER ARTHROPLASTY Left 04/22/2018   Procedure: Left total shoulder arthroplasty;  Surgeon: Melita Drivers, MD;  Location: [facility] ORS;  Service: Orthopedics;  Laterality: Left;     Family Psychiatric History: See below  Family History:  Family History  Problem Relation Age of Onset   Alcohol abuse Father    Dementia Father    Dementia Maternal Grandmother    Bipolar disorder Daughter    Anxiety disorder Daughter    Depression Brother    Drug abuse Brother    Alcohol abuse Paternal Uncle    Depression Paternal Uncle    ADD / ADHD Neg Hx     Social History:  Social History    Socioeconomic History   Marital status: Widowed    Spouse name: Not on file   Number of children: 2   Years of education: 12th grade   Highest education level: Not on file  Occupational History   Occupation: RETIRED  Tobacco Use   Smoking status: Never    Passive exposure: Current   Smokeless tobacco: Never  Vaping Use   Vaping status: Never Used  Substance and Sexual Activity   Alcohol use: Yes    Alcohol/week: 3.0 standard drinks of alcohol    Types: 3 Cans of beer per week    Comment: occasionally   Drug use: No   Sexual activity: Never  Other Topics Concern   Not on file  Social History Narrative   Not on file   Social Drivers of Health   Financial Resource Strain: Not on file  Food Insecurity: Not on file  Transportation Needs: Not on file  Physical Activity:  Inactive (10/11/2019)   [facility]   Exercise Vital Sign    Days of Exercise per Week: 0 days    Minutes of Exercise per Session: 0 min  Stress: Not on file  Social Connections: Not on file    Allergies:  Allergies  Allergen Reactions   Acetaminophen  Other (See Comments)    Liver function per MD    Levothyroxine Hives and Itching   Nabumetone Rash    Metabolic Disorder Labs: No results found for: HGBA1C, MPG No results found for: PROLACTIN No results found for: CHOL, TRIG, HDL, CHOLHDL, VLDL, LDLCALC Lab Results  Component Value Date   TSH 3.26 10/17/2019   TSH 4.540 (H) 10/28/2007    Therapeutic Level Labs: No results found for: LITHIUM No results found for: VALPROATE No results found for: CBMZ  Current Medications: Current Outpatient Medications  Medication Sig Dispense Refill   ARIPiprazole  (ABILIFY ) 2 MG tablet Take 1 tablet (2 mg total) by mouth daily. 30 tablet 2   albuterol  (PROVENTIL  HFA;VENTOLIN  HFA) 108 (90 Base) MCG/ACT inhaler Inhale 2 puffs into the lungs every 6 (six) hours as needed for wheezing or shortness of  breath.     ALPRAZolam  (XANAX ) 1 MG tablet Take 1 tablet (1 mg total) by mouth 4 (four) times daily. 120 tablet 2   amitriptyline  (ELAVIL ) 10 MG tablet Take 1 tablet (10 mg total) by mouth at bedtime as needed for sleep. 30 tablet 2   apixaban  (ELIQUIS ) 5 MG TABS tablet Take 1 tablet (5 mg total) by mouth 2 (two) times daily. 60 tablet 6   ascorbic acid (VITAMIN C) 500 MG tablet Take 1,000 mg by mouth daily.     cyanocobalamin  1000 MCG tablet Take 1,000 mcg by mouth 2 (two) times daily.     ELDERBERRY PO Take 1,000 mg by mouth daily.     FLUoxetine  (PROZAC ) 40 MG capsule Take 1 capsule (40 mg total) by mouth daily. 30 capsule 3   Fluticasone -Umeclidin-Vilant 100-62.5-25 MCG/INH AEPB Inhale 1 puff into the lungs daily.     gabapentin  (NEURONTIN ) 300 MG capsule Take 300-600 mg by mouth See admin instructions. Take 300 mg in the morning and 600 mg at bedtime     lansoprazole  (PREVACID ) 30 MG capsule Take 1 capsule (30 mg total) by mouth 2 (two) times daily before a meal. 60 capsule 1   linaclotide  (LINZESS ) 290 MCG CAPS capsule TAKE ONE CAPSULE BY MOUTH ONCE DAILY BEFORE BREAKFAST. (Patient taking differently: Take 290 mcg by mouth daily as needed (constipation).) 90 capsule 0   loratadine  (CLARITIN ) 10 MG tablet Take 1 tablet by mouth daily.     metoCLOPramide  (REGLAN ) 5 MG tablet Take 1 tablet (5 mg total) by mouth every 8 (eight) hours as needed for nausea (headache). 20 tablet 0   metoprolol  succinate (TOPROL -XL) 50 MG 24 hr tablet Take 50 mg by mouth 2 (two) times daily.     Multiple Vitamin (MULTI-VITAMIN PO) Take 1 tablet by mouth daily.     nystatin (MYCOSTATIN) powder Apply 1 g topically daily as needed (rash).  2   polyethylene glycol (MIRALAX ) 17 g packet Take 17 g by mouth in the morning.     Potassium 99 MG TABS Take 99 mg by mouth daily.     rosuvastatin  (CRESTOR ) 10 MG tablet Take 1 tablet (10 mg total) by mouth daily. (Patient not taking: Reported on 06/04/2023) 90 tablet 1    Simethicone  (GAS RELIEF PO) Take 2  capsules by mouth as needed (flatulence).      temazepam  (RESTORIL ) 30 MG capsule Take 1 capsule (30 mg total) by mouth at bedtime. 30 capsule 0   traMADol  (ULTRAM ) 50 MG tablet Take 50 mg by mouth 3 (three) times daily as needed for severe pain.     No current facility-administered medications for this visit.     Musculoskeletal: Strength & Muscle Tone: na Gait & Station: na Patient leans: N/A  Psychiatric Specialty Exam: Review of Systems  Constitutional:  Positive for fatigue and unexpected weight change.  Cardiovascular:  Positive for palpitations.  Psychiatric/Behavioral:  Positive for dysphoric mood.   All other systems reviewed and are negative.   There were no vitals taken for this visit.There is no height or weight on file to calculate BMI.  General Appearance: NA  Eye Contact:  NA  Speech:  Clear and Coherent  Volume:  Normal  Mood:  Depressed  Affect:  NA  Thought Process:  Goal Directed  Orientation:  Full (Time, Place, and Person)  Thought Content: Rumination   Suicidal Thoughts:  No  Homicidal Thoughts:  No  Memory:  Immediate;   Good Recent;   Good Remote;   NA  Judgement:  Good  Insight:  Fair  Psychomotor Activity:  Decreased  Concentration:  Concentration: Good and Attention Span: Good  Recall:  Good  Fund of Knowledge: Good  Language: Good  Akathisia:  No  Handed:  Right  AIMS (if indicated): not done  Assets:  Communication Skills Desire for Improvement Resilience Social Support  ADL's:  Intact  Cognition: WNL  Sleep:  Good   Screenings: PHQ2-9    Flowsheet Row Video Visit from 11/14/2021 in [facility] [place] Video Visit from 07/29/2021 in [facility] [place] Video Visit from 05/07/2021 in [facility] at [place] Video Visit from 12/31/2020 in [facility] at [place] Video Visit  from 08/27/2020 in [facility] at [facility] Total Score 0 1 1 0 1      Flowsheet Row Admission (Discharged) from 06/08/2023 in [facility] CARDIAC CATH LAB ED from 03/16/2023 in [facility] Emergency Department at [facility] Admission (Discharged) from 02/06/2023 in [facility]  C-SSRS RISK CATEGORY No Risk No Risk No Risk        Assessment and Plan: This patient is a 73 year old female with a history of depression and anxiety.  She seems more depressed since going through the cardiac ablation.  We will add Abilify  2 mg to the Prozac  40 mg daily for depression.  She will continue Xanax  1  Collaboration of Care: Collaboration of Care: Other provider involved in patient's care AEB notes are shared with cardiology on the epic system  Patient/Guardian was advised Release of Information must be obtained prior to any record release in order to collaborate their care with an outside provider. Patient/Guardian was advised if they have not already done so to contact the registration department to sign all necessary forms in order for us  to release information regarding their care.   Consent: Patient/Guardian gives verbal consent for treatment and assignment of benefits for services provided during this visit. Patient/Guardian expressed understanding and agreed to proceed.    Barnie Gull, MD 07/03/2023, 9:37 AM

## 2023-07-06 ENCOUNTER — Encounter (HOSPITAL_COMMUNITY): Payer: Self-pay | Admitting: Physician Assistant

## 2023-07-06 ENCOUNTER — Ambulatory Visit (HOSPITAL_COMMUNITY)
Admission: RE | Admit: 2023-07-06 | Discharge: 2023-07-06 | Disposition: A | Discharge status: Home / Self Care | Payer: 59 | Source: Ambulatory Visit | Attending: Physician Assistant | Admitting: Physician Assistant

## 2023-07-06 VITALS — BP 160/100 | HR 90 | Ht 63.0 in | Wt 195.6 lb

## 2023-07-06 DIAGNOSIS — Z7901 Long term (current) use of anticoagulants: Secondary | ICD-10-CM | POA: Insufficient documentation

## 2023-07-06 DIAGNOSIS — I4892 Unspecified atrial flutter: Secondary | ICD-10-CM | POA: Diagnosis not present

## 2023-07-06 DIAGNOSIS — I4819 Other persistent atrial fibrillation: Secondary | ICD-10-CM | POA: Insufficient documentation

## 2023-07-06 DIAGNOSIS — K7460 Unspecified cirrhosis of liver: Secondary | ICD-10-CM | POA: Insufficient documentation

## 2023-07-06 DIAGNOSIS — E039 Hypothyroidism, unspecified: Secondary | ICD-10-CM | POA: Insufficient documentation

## 2023-07-06 DIAGNOSIS — J449 Chronic obstructive pulmonary disease, unspecified: Secondary | ICD-10-CM | POA: Insufficient documentation

## 2023-07-06 DIAGNOSIS — I483 Typical atrial flutter: Secondary | ICD-10-CM | POA: Diagnosis not present

## 2023-07-06 DIAGNOSIS — Z79899 Other long term (current) drug therapy: Secondary | ICD-10-CM | POA: Insufficient documentation

## 2023-07-06 DIAGNOSIS — D6869 Other thrombophilia: Secondary | ICD-10-CM | POA: Diagnosis not present

## 2023-07-06 DIAGNOSIS — Z8619 Personal history of other infectious and parasitic diseases: Secondary | ICD-10-CM | POA: Diagnosis not present

## 2023-07-06 DIAGNOSIS — I10 Essential (primary) hypertension: Secondary | ICD-10-CM | POA: Insufficient documentation

## 2023-07-06 MED ORDER — METOPROLOL SUCCINATE ER 50 MG PO TB24: 50.0000 mg | ORAL_TABLET | Freq: Every day | ORAL | 6 refills | Status: DC

## 2023-07-06 NOTE — Patient Instructions (Signed)
Start metoprolol 50 mg once a day

## 2023-07-06 NOTE — Progress Notes (Signed)
 Primary Care Physician: Orlena Bitters, MD Primary Cardiologist: Teddie Favre, MD Electrophysiologist: Efraim Grange, MD  Referring Physician: Dr Arno Lapidus is a 73 y.o. female with a history of hep C in remission, cirrhosis, hypothyroidism, COPD, atrial flutter, atrial fibrillation who presents for follow up in the [facility]. Patient underwent DCCV on 02/06/23. She was started on amiodarone  at that time but did not tolerate the medication. The patient was seen by Dr Arlester Ladd and underwent afib and flutter ablation on 06/08/23. Patient is on Eliquis  for stroke prevention.   Patient presents today for follow up for atrial fibrillation and atrial flutter. She is in SR today and feels well. However, she has continued to have intermittent palpitations. Patient reports that she stopped Toprol  at the time she was started on amiodarone . She denies chest pain or groin issues.   Today, she denies symptoms of chest pain, shortness of breath, orthopnea, PND, lower extremity edema, dizziness, presyncope, syncope, bleeding, or neurologic sequela. The patient is tolerating medications without difficulties and is otherwise without complaint today.    Atrial Fibrillation Risk Factors:  she does have symptoms or diagnosis of sleep apnea. she does not have a history of rheumatic fever.   Atrial Fibrillation Management history:  Previous antiarrhythmic drugs: amiodarone  Previous cardioversions: 02/06/23 Previous ablations: 06/08/23 Anticoagulation history: Eliquis   ROS- All systems are reviewed and negative except as per the HPI above.  Past Medical History:  Diagnosis Date   Anxiety    Arthritis    Bronchitis    COPD (chronic obstructive pulmonary disease) (HCC)    Depression    Diverticulitis 04/2012   [facility]   Essential hypertension    Frequent PVCs    Outflow tract, positive in the inferior leads   GERD (gastroesophageal  reflux disease)    Hepatitis C    Treated   History of pneumonia    Insomnia    Migraine headache    NASH (nonalcoholic steatohepatitis)    Sleep apnea    Splenomegaly    Thyroid  disease    Wears glasses     Current Outpatient Medications  Medication Sig Dispense Refill   albuterol  (PROVENTIL  HFA;VENTOLIN  HFA) 108 (90 Base) MCG/ACT inhaler Inhale 2 puffs into the lungs every 6 (six) hours as needed for wheezing or shortness of breath.     ALPRAZolam  (XANAX ) 1 MG tablet Take 1 tablet (1 mg total) by mouth 4 (four) times daily. 120 tablet 2   amitriptyline  (ELAVIL ) 10 MG tablet Take 1 tablet (10 mg total) by mouth at bedtime as needed for sleep. 30 tablet 2   apixaban  (ELIQUIS ) 5 MG TABS tablet Take 1 tablet (5 mg total) by mouth 2 (two) times daily. 60 tablet 6   ARIPiprazole  (ABILIFY ) 2 MG tablet Take 1 tablet (2 mg total) by mouth daily. 30 tablet 2   ascorbic acid (VITAMIN C) 500 MG tablet Take 1,000 mg by mouth daily.     cephALEXin (KEFLEX) 500 MG capsule Take 500 mg by mouth 3 (three) times daily.     cyanocobalamin 1000 MCG tablet Take 1,000 mcg by mouth 2 (two) times daily.     ELDERBERRY PO Take 1,000 mg by mouth daily.     FLUoxetine  (PROZAC ) 40 MG capsule Take 1 capsule (40 mg total) by mouth daily. 30 capsule 3   Fluticasone -Umeclidin-Vilant 100-62.5-25 MCG/INH AEPB Inhale 1 puff into the lungs daily.     gabapentin  (NEURONTIN ) 300  MG capsule Take 300-600 mg by mouth See admin instructions. Take 300 mg in the morning and 600 mg at bedtime     lansoprazole  (PREVACID ) 30 MG capsule Take 1 capsule (30 mg total) by mouth 2 (two) times daily before a meal. 60 capsule 1   linaclotide  (LINZESS ) 290 MCG CAPS capsule TAKE ONE CAPSULE BY MOUTH ONCE DAILY BEFORE BREAKFAST. (Patient taking differently: Take 290 mcg by mouth daily as needed (constipation).) 90 capsule 0   loratadine  (CLARITIN ) 10 MG tablet Take 1 tablet by mouth daily.     metoCLOPramide  (REGLAN ) 5 MG tablet Take 1 tablet  (5 mg total) by mouth every 8 (eight) hours as needed for nausea (headache). 20 tablet 0   Multiple Vitamin (MULTI-VITAMIN PO) Take 1 tablet by mouth daily.     nystatin (MYCOSTATIN) powder Apply 1 g topically daily as needed (rash).  2   polyethylene glycol (MIRALAX ) 17 g packet Take 17 g by mouth in the morning.     Potassium 99 MG TABS Take 99 mg by mouth daily.     predniSONE (DELTASONE) 10 MG tablet Take 10 mg by mouth 2 (two) times daily.     rosuvastatin  (CRESTOR ) 10 MG tablet Take 1 tablet (10 mg total) by mouth daily. 90 tablet 1   Simethicone  (GAS RELIEF PO) Take 2 capsules by mouth as needed (flatulence).      temazepam  (RESTORIL ) 30 MG capsule Take 1 capsule (30 mg total) by mouth at bedtime. 30 capsule 0   traMADol  (ULTRAM ) 50 MG tablet Take 50 mg by mouth 3 (three) times daily as needed for severe pain.     metoprolol  succinate (TOPROL -XL) 50 MG 24 hr tablet Take 1 tablet (50 mg total) by mouth daily. 30 tablet 6   No current facility-administered medications for this encounter.    Physical Exam: BP (!) 160/100   Pulse 90   Ht 5\' 3"  (1.6 m)   Wt 88.7 kg   BMI 34.65 kg/m   GEN: Well nourished, well developed in no acute distress CARDIAC: Regular rate and rhythm, no murmurs, rubs, gallops RESPIRATORY:  Clear to auscultation without rales, wheezing or rhonchi  ABDOMEN: Soft, non-tender, non-distended EXTREMITIES:  No edema; No deformity   Wt Readings from Last 3 Encounters:  07/06/23 88.7 kg  06/08/23 88.9 kg  04/27/23 90.7 kg     EKG today demonstrates  SR, 1st degree AV block Vent. rate 90 BPM PR interval 214 ms QRS duration 72 ms QT/QTcB 368/450 ms   Echo 01/28/23 demonstrated   1. Left ventricular ejection fraction, by estimation, is 65 to 70%. The  left ventricle has normal function. The left ventricle has no regional  wall motion abnormalities. There is mild concentric left ventricular  hypertrophy. Left ventricular diastolic parameters are  indeterminate.   2. Right ventricular systolic function is low normal. The right  ventricular size is normal. There is normal pulmonary artery systolic  pressure. The estimated right ventricular systolic pressure is 24.0 mmHg.   3. Left atrial size was severely dilated.   4. Right atrial size was severely dilated.   5. The mitral valve is degenerative. Trivial mitral valve regurgitation.   6. The aortic valve is tricuspid. There is mild calcification of the  aortic valve. Aortic valve regurgitation is not visualized.   7. The inferior vena cava is normal in size with greater than 50%  respiratory variability, suggesting right atrial pressure of 3 mmHg.   Comparison(s): No significant change from prior  study. Prior images  reviewed side by side.    CHA2DS2-VASc Score = 3  The patient's score is based upon: CHF History: 0 HTN History: 1 Diabetes History: 0 Stroke History: 0 Vascular Disease History: 0 Age Score: 1 Gender Score: 1       ASSESSMENT AND PLAN: Persistent Atrial Fibrillation/atrial flutter The patient's CHA2DS2-VASc score is 3, indicating a 3.2% annual risk of stroke.   Did not tolerate amiodarone .  S/p afib and flutter ablation 06/08/23 Patient in SR today but having frequent palpitations.  Will resume Toprol  at 50 mg daily Hopefully palpitations will decrease as she heals from ablation.  Continue Eliquis  5 mg BID with no missed doses for 3 months post ablation.   Secondary Hypercoagulable State (ICD10:  D68.69) The patient is at significant risk for stroke/thromboembolism based upon her CHA2DS2-VASc Score of 3.  Continue Apixaban  (Eliquis ). No bleeding issues.  Suspected OSA [place] sleep study ordered 03/2023  HTN Elevated today, resume BB as above.    Follow up with Dr Arlester Ladd as scheduled.     Myrtha Ates PA-C [facility] [address] [place], [place] [address] [phone]

## 2023-07-22 ENCOUNTER — Other Ambulatory Visit: Payer: Self-pay | Admitting: Cardiology

## 2023-07-22 DIAGNOSIS — I4819 Other persistent atrial fibrillation: Secondary | ICD-10-CM

## 2023-07-22 NOTE — Telephone Encounter (Signed)
 Prescription refill request for Eliquis  received. Indication: Afib  Last office visit:07/06/23 ([place])  Scr: 0.72 (06/08/23)  Age: 73 Weight: 88.7kg  Appropriate dose. Refill sent.

## 2023-07-27 DIAGNOSIS — Z0000 Encounter for general adult medical examination without abnormal findings: Secondary | ICD-10-CM | POA: Diagnosis not present

## 2023-07-27 DIAGNOSIS — Z299 Encounter for prophylactic measures, unspecified: Secondary | ICD-10-CM | POA: Diagnosis not present

## 2023-07-27 DIAGNOSIS — Z7189 Other specified counseling: Secondary | ICD-10-CM | POA: Diagnosis not present

## 2023-07-27 DIAGNOSIS — Z713 Dietary counseling and surveillance: Secondary | ICD-10-CM | POA: Diagnosis not present

## 2023-07-27 DIAGNOSIS — E538 Deficiency of other specified B group vitamins: Secondary | ICD-10-CM | POA: Diagnosis not present

## 2023-07-27 DIAGNOSIS — I10 Essential (primary) hypertension: Secondary | ICD-10-CM | POA: Diagnosis not present

## 2023-08-01 ENCOUNTER — Other Ambulatory Visit (HOSPITAL_COMMUNITY): Payer: Self-pay | Admitting: Psychiatry

## 2023-08-14 ENCOUNTER — Telehealth (HOSPITAL_COMMUNITY): Admitting: Psychiatry

## 2023-08-14 ENCOUNTER — Encounter (HOSPITAL_COMMUNITY): Payer: Self-pay | Admitting: Psychiatry

## 2023-08-14 DIAGNOSIS — F331 Major depressive disorder, recurrent, moderate: Secondary | ICD-10-CM

## 2023-08-14 DIAGNOSIS — R109 Unspecified abdominal pain: Secondary | ICD-10-CM | POA: Diagnosis not present

## 2023-08-14 MED ORDER — ARIPIPRAZOLE 2 MG PO TABS: 2.0000 mg | ORAL_TABLET | Freq: Every day | ORAL | 2 refills | Status: DC

## 2023-08-14 MED ORDER — FLUOXETINE HCL 40 MG PO CAPS: 40.0000 mg | ORAL_CAPSULE | Freq: Every day | ORAL | 3 refills | Status: DC

## 2023-08-14 MED ORDER — TEMAZEPAM 30 MG PO CAPS: 30.0000 mg | ORAL_CAPSULE | Freq: Every day | ORAL | 2 refills | Status: DC

## 2023-08-14 MED ORDER — ALPRAZOLAM 1 MG PO TABS: 1.0000 mg | ORAL_TABLET | Freq: Four times a day (QID) | ORAL | 2 refills | Status: DC

## 2023-08-14 MED ORDER — AMITRIPTYLINE HCL 10 MG PO TABS: 10.0000 mg | ORAL_TABLET | Freq: Every evening | ORAL | 2 refills | Status: DC | PRN

## 2023-08-14 NOTE — Progress Notes (Signed)
 Virtual Visit via Telephone Note  I connected with [name] on 08/14/23 at  9:20 AM EDT by telephone and verified that I am speaking with the correct person using two identifiers.  Location: Patient: home Provider: office   I discussed the limitations, risks, security and privacy concerns of performing an evaluation and management service by telephone and the availability of in person appointments. I also discussed with the patient that there may be a patient responsible charge related to this service. The patient expressed understanding and agreed to proceed.      I discussed the assessment and treatment plan with the patient. The patient was provided an opportunity to ask questions and all were answered. The patient agreed with the plan and demonstrated an understanding of the instructions.   The patient was advised to call back or seek an in-person evaluation if the symptoms worsen or if the condition fails to improve as anticipated.  I provided 20 minutes of non-face-to-face time during this encounter.   [name], MD  [facility] MD/PA/NP OP Progress Note  08/14/2023 9:24 AM [name]  MRN:  [id number]  Chief Complaint:  Chief Complaint  Patient presents with   Anxiety   Depression   Follow-up   HPI: This patient is a 73 year old widowed white female who lives with her daughter in [place].  She is on disability.  The patient returns for follow-up after 6 weeks regarding her depression and anxiety.  Last time she was extremely fatigued and depressed after having a cardiac ablation on March 24 for her atrial flutter/fibrillation.  We did add Abilify  to her regimen and she states that she is starting to feel better.  She is also now on Toprol  and her primary doctor has started B12 injections and all of this seems to be helping.  She still has occasional palpitations but they are declining.  Right now she has a friend staying with her and they are planning to do a trip to  [place]  which has brought up her spirits.  She is still sleeping well.  She is still using amitriptyline  at bedtime with the trazodone  and given that it can have some cardiac side effects I would rather she not take it if she can get up by without it and she states that she will try.  She is generally sleeping well and denies any thoughts of suicide or self-harm.  Her energy is slowly improving as is her mood. Visit Diagnosis:    ICD-10-CM   1. Major depressive disorder, recurrent episode, moderate (HCC)  F33.1     2. Functional abdominal pain syndrome  R10.9 amitriptyline  (ELAVIL ) 10 MG tablet      Past Psychiatric History: None  Past Medical History:  Past Medical History:  Diagnosis Date   Anxiety    Arthritis    Bronchitis    COPD (chronic obstructive pulmonary disease) (HCC)    Depression    Diverticulitis 04/2012   [facility]   Essential hypertension    Frequent PVCs    Outflow tract, positive in the inferior leads   GERD (gastroesophageal reflux disease)    Hepatitis C    Treated   History of pneumonia    Insomnia    Migraine headache    NASH (nonalcoholic steatohepatitis)    Sleep apnea    Splenomegaly    Thyroid  disease    Wears glasses     Past Surgical History:  Procedure Laterality Date   ABDOMINAL HYSTERECTOMY  ANKLE SURGERY Right    ATRIAL FIBRILLATION ABLATION N/A 06/08/2023   Procedure: ATRIAL FIBRILLATION ABLATION;  Surgeon: Efraim Grange, MD;  [facility];  Service: Cardiovascular;  Laterality: N/A;   BIOPSY  10/05/2020   Procedure: BIOPSY;  Surgeon: Urban Garden, MD;  Location: [facility];  Service: Gastroenterology;;   CARDIOVERSION N/A 02/06/2023   Procedure: CARDIOVERSION;  Surgeon: Lasalle Pointer, MD;  Location: [facility] ORS;  Service: Cardiovascular;  Laterality: N/A;   COLON SURGERY     "part of color removed"   COLONOSCOPY     COLONOSCOPY WITH PROPOFOL  N/A 11/13/2020   Castaneda:single  non bleeding colonic angiodysplastic lesion, diverticulosis in sigmoid, non bleeding external hemorrhoids, no specimens   ESOPHAGEAL BANDING  09/16/2022   Procedure: ESOPHAGEAL BANDING;  Surgeon: Urban Garden, MD;  [facility]: [facility];  Service: Gastroenterology;;   ESOPHAGEAL MANOMETRY N/A 04/10/2014   Procedure: ESOPHAGEAL MANOMETRY (EM);  Surgeon: Kenney Peacemaker, MD;  Location: [facility] ENDOS[facility];  Service: Endoscopy;  Laterality: N/A;   ESOPHAGOGASTRODUODENOSCOPY     ESOPHAGOGASTRODUODENOSCOPY (EGD) WITH PROPOFOL  N/A 10/05/2020   Castaneda: barrett's esophagus, short segment, 1cm hh, normal stomach, normal duodenum, biopsied, normal, no varices   ESOPHAGOGASTRODUODENOSCOPY (EGD) WITH PROPOFOL  N/A 09/05/2022   Procedure: ESOPHAGOGASTRODUODENOSCOPY (EGD) WITH PROPOFOL ;  Surgeon: Urban Garden, MD;  [facility];  Service: Gastroenterology;  Laterality: N/A;  2:00PM;ASA 3   ESOPHAGOGASTRODUODENOSCOPY (EGD) WITH PROPOFOL  N/A 09/16/2022   Procedure: ESOPHAGOGASTRODUODENOSCOPY (EGD) WITH PROPOFOL ;  Surgeon: Urban Garden, MD;  [facility];  Service: Gastroenterology;  Laterality: N/A;  10:00am;asa 3   INCISIONAL HERNIA REPAIR N/A 10/24/2015   Procedure: LAPAROSCOPIC REPAIR INCISIONAL HERNIA WITH MESH;  Surgeon: Dorena Gander, MD;  Location: [facility] OR;  Service: General;  Laterality: N/A;   INSERTION OF MESH N/A 10/24/2015   Procedure: INSERTION OF MESH;  Surgeon: Dorena Gander, MD;  Location: [facility] OR;  Service: General;  Laterality: N/A;   JOINT REPLACEMENT     KNEE SURGERY Right    x4   PH IMPEDANCE STUDY N/A 04/10/2014   Procedure: PH IMPEDANCE STUDY;  Surgeon: Kenney Peacemaker, MD;  Location: [facility];  Service: Endoscopy;  Laterality: N/A;   TOTAL SHOULDER ARTHROPLASTY Left 04/22/2018   Procedure: Left total shoulder arthroplasty;  Surgeon: Ellard Gunning, MD;  Location: [facility];  Service: Orthopedics;  Laterality: Left;     Family  Psychiatric History: See below  Family History:  Family History  Problem Relation Age of Onset   Alcohol abuse Father    Dementia Father    Dementia Maternal Grandmother    Bipolar disorder Daughter    Anxiety disorder Daughter    Depression Brother    Drug abuse Brother    Alcohol abuse Paternal Uncle    Depression Paternal Uncle    ADD / ADHD Neg Hx     Social History:  Social History   Socioeconomic History   Marital status: Widowed    Spouse name: Not on file   Number of children: 2   Years of education: 12th grade   Highest education level: Not on file  Occupational History   Occupation: RETIRED  Tobacco Use   Smoking status: Never    Passive exposure: Current   Smokeless tobacco: Never   Tobacco comments:    Never smoked 07/06/23  Vaping Use   Vaping status: Never Used  Substance and Sexual Activity   Alcohol use: Yes    Alcohol/week: 3.0  standard drinks of alcohol    Types: 3 Cans of beer per week    Comment: occasionally   Drug use: No   Sexual activity: Never  Other Topics Concern   Not on file  Social History Narrative   Not on file   Social Drivers of Health   Financial Resource Strain: Not on file  Food Insecurity: Not on file  Transportation Needs: Not on file  Physical Activity: Inactive (10/11/2019)   Received from [facility]   Exercise Vital Sign    Days of Exercise per Week: 0 days    Minutes of Exercise per Session: 0 min  Stress: Not on file  Social Connections: Not on file    Allergies:  Allergies  Allergen Reactions   Acetaminophen  Other (See Comments)    Liver function per MD    Levothyroxine Hives and Itching   Nabumetone Rash    Metabolic Disorder Labs: No results found for: "HGBA1C", "MPG" No results found for: "PROLACTIN" No results found for: "CHOL", "TRIG", "HDL", "CHOLHDL", "VLDL", "LDLCALC" Lab Results  Component Value Date   TSH 3.26 10/17/2019   TSH 4.540 (H) 10/28/2007     Therapeutic Level Labs: No results found for: "LITHIUM" No results found for: "VALPROATE" No results found for: "CBMZ"  Current Medications: Current Outpatient Medications  Medication Sig Dispense Refill   albuterol  (PROVENTIL  HFA;VENTOLIN  HFA) 108 (90 Base) MCG/ACT inhaler Inhale 2 puffs into the lungs every 6 (six) hours as needed for wheezing or shortness of breath.     ALPRAZolam  (XANAX ) 1 MG tablet Take 1 tablet (1 mg total) by mouth 4 (four) times daily. 120 tablet 2   amitriptyline  (ELAVIL ) 10 MG tablet Take 1 tablet (10 mg total) by mouth at bedtime as needed for sleep. 30 tablet 2   apixaban  (ELIQUIS ) 5 MG TABS tablet TAKE ONE TABLET BY MOUTH TWICE DAILY 60 tablet 5   ARIPiprazole  (ABILIFY ) 2 MG tablet Take 1 tablet (2 mg total) by mouth daily. 30 tablet 2   ascorbic acid (VITAMIN C) 500 MG tablet Take 1,000 mg by mouth daily.     cephALEXin (KEFLEX) 500 MG capsule Take 500 mg by mouth 3 (three) times daily.     cyanocobalamin 1000 MCG tablet Take 1,000 mcg by mouth 2 (two) times daily.     ELDERBERRY PO Take 1,000 mg by mouth daily.     FLUoxetine  (PROZAC ) 40 MG capsule Take 1 capsule (40 mg total) by mouth daily. 30 capsule 3   Fluticasone -Umeclidin-Vilant 100-62.5-25 MCG/INH AEPB Inhale 1 puff into the lungs daily.     gabapentin  (NEURONTIN ) 300 MG capsule Take 300-600 mg by mouth See admin instructions. Take 300 mg in the morning and 600 mg at bedtime     lansoprazole  (PREVACID ) 30 MG capsule Take 1 capsule (30 mg total) by mouth 2 (two) times daily before a meal. 60 capsule 1   linaclotide  (LINZESS ) 290 MCG CAPS capsule TAKE ONE CAPSULE BY MOUTH ONCE DAILY BEFORE BREAKFAST. (Patient taking differently: Take 290 mcg by mouth daily as needed (constipation).) 90 capsule 0   loratadine  (CLARITIN ) 10 MG tablet Take 1 tablet by mouth daily.     metoCLOPramide  (REGLAN ) 5 MG tablet Take 1 tablet (5 mg total) by mouth every 8 (eight) hours as needed for nausea (headache). 20 tablet  0   metoprolol  succinate (TOPROL -XL) 50 MG 24 hr tablet Take 1 tablet (50 mg total) by mouth daily. 30 tablet 6   Multiple Vitamin (  MULTI-VITAMIN PO) Take 1 tablet by mouth daily.     nystatin (MYCOSTATIN) powder Apply 1 g topically daily as needed (rash).  2   polyethylene glycol (MIRALAX ) 17 g packet Take 17 g by mouth in the morning.     Potassium 99 MG TABS Take 99 mg by mouth daily.     predniSONE (DELTASONE) 10 MG tablet Take 10 mg by mouth 2 (two) times daily.     rosuvastatin  (CRESTOR ) 10 MG tablet Take 1 tablet (10 mg total) by mouth daily. 90 tablet 1   Simethicone  (GAS RELIEF PO) Take 2 capsules by mouth as needed (flatulence).      temazepam  (RESTORIL ) 30 MG capsule Take 1 capsule (30 mg total) by mouth at bedtime. 30 capsule 2   traMADol  (ULTRAM ) 50 MG tablet Take 50 mg by mouth 3 (three) times daily as needed for severe pain.     No current facility-administered medications for this visit.     Musculoskeletal: Strength & Muscle Tone: n/a Gait & Station: n/a Patient leans: N/A  Psychiatric Specialty Exam: Review of Systems  Constitutional:  Positive for fatigue.  Cardiovascular:  Positive for palpitations.  All other systems reviewed and are negative.   There were no vitals taken for this visit.There is no height or weight on file to calculate BMI.  General Appearance: NA  Eye Contact:  NA  Speech:  Clear and Coherent  Volume:  Normal  Mood:  Euthymic  Affect:  NA  Thought Process:  Goal Directed  Orientation:  Full (Time, Place, and Person)  Thought Content: WDL   Suicidal Thoughts:  No  Homicidal Thoughts:  No  Memory:  Immediate;   Good Recent;   Good Remote;   Fair  Judgement:  Good  Insight:  Fair  Psychomotor Activity:  Decreased  Concentration:  Concentration: Good and Attention Span: Good  Recall:  Good  Fund of Knowledge: Good  Language: Good  Akathisia:  No  Handed:  Right  AIMS (if indicated): not done  Assets:  Communication Skills Desire  for Improvement Resilience Social Support  ADL's:  Intact  Cognition: WNL  Sleep:  Good   Screenings: PHQ2-9    Flowsheet Row Video Visit from 11/14/2021 in [facility] [place] Video Visit from 07/29/2021 in [facility] [place] Video Visit from 05/07/2021 in [facility] at [place] Video Visit from 12/31/2020 in [facility] at [place] Video Visit from 08/27/2020 in [facility] at [facility] Total Score 0 1 1 0 1      Flowsheet Row Admission (Discharged) from 06/08/2023 in [facility] CARDIAC CATH LAB ED from 03/16/2023 in [facility] Emergency Department at [facility] Admission (Discharged) from 02/06/2023 in [facility]  C-SSRS RISK CATEGORY No Risk No Risk No Risk        Assessment and Plan: This patient is a 73 year old female with a history of depression and anxiety.  She was feeling very low after her recent cardiac ablation.  However she is doing better now for a number of reasons 1 of which is the addition of Abilify  2 mg to her regimen.  This will be continued along with Prozac  40 mg daily for depression, Xanax  1 mg 4 times daily for anxiety, temazepam  30 mg at bedtime for sleep.  She still has the amitriptyline  10 mg at bedtime I have asked her to try  to hold off on this if possible.  She will return to see me in 3 months  Collaboration of Care: Collaboration of Care: Other provider involved in patient's care AEB notes are shared with cardiology on the epic system  Patient/Guardian was advised Release of Information must be obtained prior to any record release in order to collaborate their care with an outside provider. Patient/Guardian was advised if they have not already done so to contact the registration department to sign all necessary forms in order for us  to release  information regarding their care.   Consent: Patient/Guardian gives verbal consent for treatment and assignment of benefits for services provided during this visit. Patient/Guardian expressed understanding and agreed to proceed.    [name], MD 08/14/2023, 9:24 AM

## 2023-08-21 ENCOUNTER — Ambulatory Visit: Payer: 59 | Attending: Cardiovascular Disease | Admitting: Cardiovascular Disease

## 2023-08-21 VITALS — BP 138/74 | HR 80 | Ht 63.0 in | Wt 207.6 lb

## 2023-08-21 DIAGNOSIS — I483 Typical atrial flutter: Secondary | ICD-10-CM

## 2023-08-21 DIAGNOSIS — H5203 Hypermetropia, bilateral: Secondary | ICD-10-CM | POA: Diagnosis not present

## 2023-08-21 DIAGNOSIS — H40033 Anatomical narrow angle, bilateral: Secondary | ICD-10-CM | POA: Diagnosis not present

## 2023-08-21 NOTE — Patient Instructions (Signed)

## 2023-08-21 NOTE — Progress Notes (Signed)
 Electrophysiology Office Note:    Date:  08/21/2023   ID:  [name], DOB Sep 27, 1950, MRN [id number]  PCP:  Orlena Bitters, MD   [facility] Providers Cardiologist:  Teddie Favre, MD Electrophysiologist:  Efraim Grange, MD     Referring MD: Orlena Bitters, MD   History of Present Illness:    [name] is a 73 y.o. female with a medical history significant for atrial flutter, PVCs, hypertension, hepatitis C in remission, cirrhosis, hypothyroidism, COPD, referred for arrhythmia management.     She was initially diagnosed with atrial flutter when presenting for an EGD which was canceled due to the arrhythmia. she underwent DC cardioversion on February 06, 2023.  She reported that she felt better in sinus rhythm  I discussed the use of AI scribe software for clinical note transcription with the patient, who gave verbal consent to proceed.  The patient, with a history of cirrhosis, COPD, and atrial fibrillation, presents for a follow-up visit. She reports feeling her heart beating abnormally for the past three to four years, which she describes as a 'flutter.' The patient has been on blood pressure medication for an unspecified duration. The atrial fibrillation was discovered during an endoscopy appointment, which was subsequently cancelled due to the patient's heart rhythm. The patient underwent a cardioversion in November, but quickly reverted back to atrial fibrillation. She reports feeling more tired than usual and has noticed changes in her activity level, including shortness of breath and increased sleep. The patient also has a cough, which she attributes to a recent virus and her COPD. She is currently taking Eliquis , a blood thinner, without any reported issues. The patient has been tested for sleep apnea in the past, but does not recall the results and has not been on treatment. She has been losing weight, going from 224 to 196 pounds.  She underwent ablation for  atrial fibrillation on June 17, 2023 with ablation of the pulmonary veins and posterior wall of the left atrium as well as a cavotricuspid isthmus line.  Amiodarone  was discontinued at the time of ablation.  She presented for ablation follow-up in A-fib clinic complaining of intermittent palpitations.  Metoprolol  was restarted. She has occasional brief palpitations occasionally.      Today, she reports she feels well. He primary concern is that she has regained weight.  EKGs/Labs/Other Studies Reviewed Today:     Echocardiogram:  TTE November 2024 EF 60 to 70%.  Severe biatrial dilation.  Trivial mitral regurgitation.   Monitors:  7 day monitor September 2024-- my interpretation 100% atrial fibrillation burden, possibly organizing into flutter: Heart rate 38 to 146 bpm, average 58 bpm 19.8% PVC burden.  PVCs are pleomorphic    EKG:         Physical Exam:    VS:  There were no vitals taken for this visit.    Wt Readings from Last 3 Encounters:  07/06/23 195 lb 9.6 oz (88.7 kg)  06/08/23 196 lb (88.9 kg)  04/27/23 199 lb 14.4 oz (90.7 kg)     GEN:  Well nourished, well developed in no acute distress CARDIAC: iRRR, no murmurs, rubs, gallops RESPIRATORY:  Normal work of breathing MUSCULOSKELETAL: no edema    ASSESSMENT & PLAN:     Atrial fibrillation and flutter Persistent, refractory to cardioversion Medical therapy is limited due to cirrhosis She is very symptomatic with severe fatigue and lack of energy She appears to be maintaining sinus rhythm since ablation  Secondary hypercoagulable state Continue Eliquis  5 mg twice daily   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

## 2023-08-24 ENCOUNTER — Ambulatory Visit: Payer: 59 | Admitting: Nurse Practitioner

## 2023-08-25 DIAGNOSIS — Z299 Encounter for prophylactic measures, unspecified: Secondary | ICD-10-CM | POA: Diagnosis not present

## 2023-08-25 DIAGNOSIS — I10 Essential (primary) hypertension: Secondary | ICD-10-CM | POA: Diagnosis not present

## 2023-08-25 DIAGNOSIS — E538 Deficiency of other specified B group vitamins: Secondary | ICD-10-CM | POA: Diagnosis not present

## 2023-08-25 DIAGNOSIS — M179 Osteoarthritis of knee, unspecified: Secondary | ICD-10-CM | POA: Diagnosis not present

## 2023-09-03 ENCOUNTER — Ambulatory Visit (INDEPENDENT_AMBULATORY_CARE_PROVIDER_SITE_OTHER): Admitting: Gastroenterology

## 2023-09-10 DIAGNOSIS — I10 Essential (primary) hypertension: Secondary | ICD-10-CM | POA: Diagnosis not present

## 2023-09-10 DIAGNOSIS — I8500 Esophageal varices without bleeding: Secondary | ICD-10-CM | POA: Diagnosis not present

## 2023-09-10 DIAGNOSIS — K7460 Unspecified cirrhosis of liver: Secondary | ICD-10-CM | POA: Diagnosis not present

## 2023-09-10 DIAGNOSIS — Z299 Encounter for prophylactic measures, unspecified: Secondary | ICD-10-CM | POA: Diagnosis not present

## 2023-09-14 ENCOUNTER — Telehealth (INDEPENDENT_AMBULATORY_CARE_PROVIDER_SITE_OTHER): Payer: Self-pay

## 2023-09-14 NOTE — Telephone Encounter (Signed)
 Per Ann labs came in today and she forwarded them to you.

## 2023-09-14 NOTE — Telephone Encounter (Signed)
 Patient concerned over her Plt counts. She says she had a visit with PCP Dr. Rosamond and he told her the plt counts were low and she needed to call our office regarding this as it could be related to her liver. She says she has had nose bleeds off and on for the last two weeks. Patient does not see a hematologist. Please advise.

## 2023-09-14 NOTE — Telephone Encounter (Signed)
 I called the patient and go no answer. I have reached out to Dr. Rosamond office to fax the most recent cbc to our office.

## 2023-09-15 NOTE — Telephone Encounter (Signed)
 I tried calling the patient no answer. I left a voice mail asking the patient to please return call to the office.

## 2023-09-15 NOTE — Telephone Encounter (Signed)
 Tried calling the patient phone busy signal when called.

## 2023-09-16 NOTE — Telephone Encounter (Signed)
 I tried calling patient again today, I left a VM asking the patient to please return call to the office.

## 2023-09-17 NOTE — Telephone Encounter (Signed)
Tried calling again, no answer

## 2023-09-21 ENCOUNTER — Encounter (INDEPENDENT_AMBULATORY_CARE_PROVIDER_SITE_OTHER): Payer: Self-pay

## 2023-09-21 NOTE — Telephone Encounter (Signed)
 I have tried calling and sending the patient a My Chart message with no response, I have mailed the letter below to the patient asking her to call the office.   September 21, 2023   [name] [address] [place] [address]   Dear [name], I have made several attempts at reaching you by phone and have left voice messages for you to call the office. Per [facility],   Labs reviewed, CBC normal other than plt count of 61k which has been baseline for her. If she is symptomatic, we may need to refer her to hematology.   Please let us  know if you want us  to make a referral to hematology (a blood doctor). You can respond here or call the office at [phone] for Garion Wempe.    Thanks,  [employer]

## 2023-09-22 NOTE — Telephone Encounter (Signed)
 Tried reaching via phone numerous times and My Chart. Mailed a letter to the patient asking her to reach out to the office.

## 2023-09-24 NOTE — Telephone Encounter (Signed)
 I tried calling the patient and her VM is not set up. Jenkins would you mind please making a referral to Hematology as per Chelsea's recommendations? Thanks  Patient returned call today, (and left a voice message on the nurse line at the clinic) today 09/24/2023 after getting the letter I mailed to her. She says she would like a referral to Hematology. Patient says she feels she needs to see one as she is tired a lot, and still having nose bleeds.

## 2023-09-24 NOTE — Telephone Encounter (Signed)
 Tried calling patient again, no answer and Vm is full.

## 2023-09-24 NOTE — Telephone Encounter (Signed)
 Tried calling the [phone] sounds like the phone is picked up, but no one answers.

## 2023-09-24 NOTE — Telephone Encounter (Signed)
 Referral completed, TCS apt letter sent to PCP

## 2023-09-24 NOTE — Telephone Encounter (Signed)
 Tried calling the patient again, no answer and Vm is not set up.

## 2023-09-25 ENCOUNTER — Encounter (INDEPENDENT_AMBULATORY_CARE_PROVIDER_SITE_OTHER): Payer: Self-pay

## 2023-09-25 NOTE — Telephone Encounter (Signed)
 September 25, 2023   AHMYAH GIDLEY [address] [place] [address]  Letter mailed 09/25/2023 to patient below.   Dear Ms. Perham,  We have tried reaching out to you multiple times by phone and by My Chart. Your Voice Mail is full and unable to leave any messages. We have made the referral for you to see a (blood Doctor) Hematologist. Please clear out your Voice mails so they can reach you to schedule you an appointment. If you have any questions please do not hesitate to reach out to us  either by My Chart or by Phone.   Thanks,  Elester Apodaca.

## 2023-09-25 NOTE — Telephone Encounter (Signed)
 Tried calling again, no answer and no vm.

## 2023-09-28 ENCOUNTER — Ambulatory Visit: Payer: 59 | Attending: Nurse Practitioner | Admitting: Nurse Practitioner

## 2023-09-29 ENCOUNTER — Encounter: Payer: Self-pay | Admitting: Nurse Practitioner

## 2023-09-30 DIAGNOSIS — E538 Deficiency of other specified B group vitamins: Secondary | ICD-10-CM | POA: Diagnosis not present

## 2023-10-20 ENCOUNTER — Ambulatory Visit (INDEPENDENT_AMBULATORY_CARE_PROVIDER_SITE_OTHER): Admitting: Gastroenterology

## 2023-10-20 ENCOUNTER — Encounter: Payer: Self-pay | Admitting: Nurse Practitioner

## 2023-10-20 ENCOUNTER — Ambulatory Visit: Attending: Nurse Practitioner | Admitting: Nurse Practitioner

## 2023-10-27 ENCOUNTER — Encounter (INDEPENDENT_AMBULATORY_CARE_PROVIDER_SITE_OTHER): Payer: Self-pay | Admitting: *Deleted

## 2023-10-27 DIAGNOSIS — I10 Essential (primary) hypertension: Secondary | ICD-10-CM | POA: Diagnosis not present

## 2023-10-27 DIAGNOSIS — J069 Acute upper respiratory infection, unspecified: Secondary | ICD-10-CM | POA: Diagnosis not present

## 2023-10-27 DIAGNOSIS — G629 Polyneuropathy, unspecified: Secondary | ICD-10-CM | POA: Diagnosis not present

## 2023-10-27 DIAGNOSIS — M7989 Other specified soft tissue disorders: Secondary | ICD-10-CM | POA: Diagnosis not present

## 2023-10-27 DIAGNOSIS — R197 Diarrhea, unspecified: Secondary | ICD-10-CM | POA: Diagnosis not present

## 2023-10-29 ENCOUNTER — Ambulatory Visit (INDEPENDENT_AMBULATORY_CARE_PROVIDER_SITE_OTHER): Admitting: Gastroenterology

## 2023-11-05 ENCOUNTER — Telehealth: Payer: Self-pay | Admitting: *Deleted

## 2023-11-05 ENCOUNTER — Encounter: Payer: Self-pay | Admitting: *Deleted

## 2023-11-05 ENCOUNTER — Other Ambulatory Visit: Payer: Self-pay | Admitting: *Deleted

## 2023-11-05 DIAGNOSIS — K7460 Unspecified cirrhosis of liver: Secondary | ICD-10-CM

## 2023-11-05 NOTE — Telephone Encounter (Signed)
 Pt left vm stating she received letter to schedule US . US  has been scheduled and information sent to pt via mychart.

## 2023-11-13 DIAGNOSIS — E7800 Pure hypercholesterolemia, unspecified: Secondary | ICD-10-CM | POA: Diagnosis not present

## 2023-11-13 DIAGNOSIS — R5383 Other fatigue: Secondary | ICD-10-CM | POA: Diagnosis not present

## 2023-11-13 DIAGNOSIS — Z0000 Encounter for general adult medical examination without abnormal findings: Secondary | ICD-10-CM | POA: Diagnosis not present

## 2023-11-13 DIAGNOSIS — Z299 Encounter for prophylactic measures, unspecified: Secondary | ICD-10-CM | POA: Diagnosis not present

## 2023-11-13 DIAGNOSIS — E039 Hypothyroidism, unspecified: Secondary | ICD-10-CM | POA: Diagnosis not present

## 2023-11-13 DIAGNOSIS — J449 Chronic obstructive pulmonary disease, unspecified: Secondary | ICD-10-CM | POA: Diagnosis not present

## 2023-11-13 DIAGNOSIS — J069 Acute upper respiratory infection, unspecified: Secondary | ICD-10-CM | POA: Diagnosis not present

## 2023-11-13 DIAGNOSIS — I10 Essential (primary) hypertension: Secondary | ICD-10-CM | POA: Diagnosis not present

## 2023-11-13 DIAGNOSIS — R52 Pain, unspecified: Secondary | ICD-10-CM | POA: Diagnosis not present

## 2023-11-18 ENCOUNTER — Ambulatory Visit (HOSPITAL_COMMUNITY): Attending: Gastroenterology

## 2023-11-20 ENCOUNTER — Telehealth (HOSPITAL_COMMUNITY): Admitting: Psychiatry

## 2023-11-24 ENCOUNTER — Other Ambulatory Visit (HOSPITAL_COMMUNITY): Payer: Self-pay | Admitting: Psychiatry

## 2023-11-24 NOTE — Telephone Encounter (Signed)
Call for appt, missed last one

## 2023-12-03 ENCOUNTER — Telehealth (HOSPITAL_COMMUNITY): Admitting: Psychiatry

## 2023-12-03 ENCOUNTER — Encounter (HOSPITAL_COMMUNITY): Payer: Self-pay | Admitting: Psychiatry

## 2023-12-03 DIAGNOSIS — R109 Unspecified abdominal pain: Secondary | ICD-10-CM | POA: Diagnosis not present

## 2023-12-03 DIAGNOSIS — F331 Major depressive disorder, recurrent, moderate: Secondary | ICD-10-CM

## 2023-12-03 MED ORDER — ALPRAZOLAM 1 MG PO TABS: 1.0000 mg | ORAL_TABLET | Freq: Four times a day (QID) | ORAL | 2 refills | Status: DC

## 2023-12-03 MED ORDER — TEMAZEPAM 30 MG PO CAPS: 30.0000 mg | ORAL_CAPSULE | Freq: Every day | ORAL | 2 refills | Status: DC

## 2023-12-03 MED ORDER — ARIPIPRAZOLE 2 MG PO TABS: 2.0000 mg | ORAL_TABLET | Freq: Every day | ORAL | 2 refills | Status: DC

## 2023-12-03 MED ORDER — FLUOXETINE HCL 40 MG PO CAPS: 40.0000 mg | ORAL_CAPSULE | Freq: Every day | ORAL | 3 refills | Status: DC

## 2023-12-03 MED ORDER — AMITRIPTYLINE HCL 10 MG PO TABS: 10.0000 mg | ORAL_TABLET | Freq: Every evening | ORAL | 2 refills | Status: DC | PRN

## 2023-12-03 NOTE — Progress Notes (Signed)
 Virtual Visit via Telephone Note  I connected with [name] on 12/03/23 at  9:40 AM EDT by telephone and verified that I am speaking with the correct person using two identifiers.  Location: Patient: home Provider: office   I discussed the limitations, risks, security and privacy concerns of performing an evaluation and management service by telephone and the availability of in person appointments. I also discussed with the patient that there may be a patient responsible charge related to this service. The patient expressed understanding and agreed to proceed.      I discussed the assessment and treatment plan with the patient. The patient was provided an opportunity to ask questions and all were answered. The patient agreed with the plan and demonstrated an understanding of the instructions.   The patient was advised to call back or seek an in-person evaluation if the symptoms worsen or if the condition fails to improve as anticipated.  I provided 20 minutes of non-face-to-face time during this encounter.   Barnie Gull, MD  [facility] MD/PA/NP OP Progress Note  12/03/2023 9:54 AM [name]  MRN:  [id number]  Chief Complaint:  Chief Complaint  Patient presents with   Depression   Anxiety   Follow-up   HPI: This patient is a 73 year old widowed white female who lives with her daughter in [place]. She is on disability.   The patient returns after 4 months regarding her recurrent moderate depression and anxiety.  She states that her best friend has been declining due to metastatic stomach cancer.  She and the friend went on a trip to [place]  a couple of months ago and since then the friend has gotten worse.  She is now not able to tolerate much food.  The patient has been staying with her friend and claims that she will stay with her to the end.  They have been friends for over 50 years.  She states that she is depressed about the situation but she does still think her  medications have helped her mood.  She is generally sleeping pretty well and her anxiety is well-controlled.  She denies any thoughts of suicide or self-harm. Visit Diagnosis:    ICD-10-CM   1. Major depressive disorder, recurrent episode, moderate (HCC)  F33.1     2. Functional abdominal pain syndrome  R10.9 amitriptyline  (ELAVIL ) 10 MG tablet      Past Psychiatric History: none  Past Medical History:  Past Medical History:  Diagnosis Date   Anxiety    Arthritis    Bronchitis    COPD (chronic obstructive pulmonary disease) (HCC)    Depression    Diverticulitis 04/2012   [facility]   Essential hypertension    Frequent PVCs    Outflow tract, positive in the inferior leads   GERD (gastroesophageal reflux disease)    Hepatitis C    Treated   History of pneumonia    Insomnia    Migraine headache    NASH (nonalcoholic steatohepatitis)    Sleep apnea    Splenomegaly    Thyroid  disease    Wears glasses     Past Surgical History:  Procedure Laterality Date   ABDOMINAL HYSTERECTOMY     ANKLE SURGERY Right    ATRIAL FIBRILLATION ABLATION N/A 06/08/2023   Procedure: ATRIAL FIBRILLATION ABLATION;  Surgeon: Nancey Eulas BRAVO, MD;  [facility];  Service: Cardiovascular;  Laterality: N/A;   BIOPSY  10/05/2020   Procedure: BIOPSY;  Surgeon: Eartha Angelia Sieving,  MD;  Location: [facility];  Service: Gastroenterology;;   CARDIOVERSION N/A 02/06/2023   Procedure: CARDIOVERSION;  Surgeon: Stacia Diannah SQUIBB, MD;  Location: [facility] ORS;  Service: Cardiovascular;  Laterality: N/A;   COLON SURGERY     part of color removed   COLONOSCOPY     COLONOSCOPY WITH PROPOFOL  N/A 11/13/2020   Castaneda:single non bleeding colonic angiodysplastic lesion, diverticulosis in sigmoid, non bleeding external hemorrhoids, no specimens   ESOPHAGEAL BANDING  09/16/2022   Procedure: ESOPHAGEAL BANDING;  Surgeon: Eartha Angelia Sieving, MD;  [facility];   Service: Gastroenterology;;   ESOPHAGEAL MANOMETRY N/A 04/10/2014   Procedure: ESOPHAGEAL MANOMETRY (EM);  Surgeon: Lupita FORBES Commander, MD;  Location: [facility]OS[facility];  Service: Endoscopy;  Laterality: N/A;   ESOPHAGOGASTRODUODENOSCOPY     ESOPHAGOGASTRODUODENOSCOPY (EGD) WITH PROPOFOL  N/A 10/05/2020   Castaneda: barrett's esophagus, short segment, 1cm hh, normal stomach, normal duodenum, biopsied, normal, no varices   ESOPHAGOGASTRODUODENOSCOPY (EGD) WITH PROPOFOL  N/A 09/05/2022   Procedure: ESOPHAGOGASTRODUODENOSCOPY (EGD) WITH PROPOFOL ;  Surgeon: Eartha Angelia Sieving, MD;  [facility];  Service: Gastroenterology;  Laterality: N/A;  2:00PM;ASA 3   ESOPHAGOGASTRODUODENOSCOPY (EGD) WITH PROPOFOL  N/A 09/16/2022   Procedure: ESOPHAGOGASTRODUODENOSCOPY (EGD) WITH PROPOFOL ;  Surgeon: Eartha Angelia Sieving, MD;  [facility];  Service: Gastroenterology;  Laterality: N/A;  10:00am;asa 3   INCISIONAL HERNIA REPAIR N/A 10/24/2015   Procedure: LAPAROSCOPIC REPAIR INCISIONAL HERNIA WITH MESH;  Surgeon: Dann Hummer, MD;  [facility] OR;  Service: General;  Laterality: N/A;   INSERTION OF MESH N/A 10/24/2015   Procedure: INSERTION OF MESH;  Surgeon: Dann Hummer, MD;  Location: [facility] OR;  Service: General;  Laterality: N/A;   JOINT REPLACEMENT     KNEE SURGERY Right    x4   PH IMPEDANCE STUDY N/A 04/10/2014   Procedure: PH IMPEDANCE STUDY;  Surgeon: Lupita FORBES Commander, MD;  Location: [facility];  Service: Endoscopy;  Laterality: N/A;   TOTAL SHOULDER ARTHROPLASTY Left 04/22/2018   Procedure: Left total shoulder arthroplasty;  Surgeon: Melita Drivers, MD;  Location: [facility] ORS;  Service: Orthopedics;  Laterality: Left;     Family Psychiatric History: See below  Family History:  Family History  Problem Relation Age of Onset   Alcohol abuse Father    Dementia Father    Dementia Maternal Grandmother    Bipolar disorder Daughter    Anxiety disorder Daughter    Depression  Brother    Drug abuse Brother    Alcohol abuse Paternal Uncle    Depression Paternal Uncle    ADD / ADHD Neg Hx     Social History:  Social History   Socioeconomic History   Marital status: Widowed    Spouse name: Not on file   Number of children: 2   Years of education: 12th grade   Highest education level: Not on file  Occupational History   Occupation: RETIRED  Tobacco Use   Smoking status: Never    Passive exposure: Current   Smokeless tobacco: Never   Tobacco comments:    Never smoked 07/06/23  Vaping Use   Vaping status: Never Used  Substance and Sexual Activity   Alcohol use: Yes    Alcohol/week: 3.0 standard drinks of alcohol    Types: 3 Cans of beer per week    Comment: occasionally   Drug use: No   Sexual activity: Never  Other Topics Concern   Not on file  Social History Narrative   Not on file  Social Drivers of Corporate investment banker Strain: Not on file  Food Insecurity: Not on file  Transportation Needs: Not on file  Physical Activity: Inactive (10/11/2019)   Received from [facility]   Exercise Vital Sign    On average, how many days per week do you engage in moderate to strenuous exercise (like a brisk walk)?: 0 days    On average, how many minutes do you engage in exercise at this level?: 0 min  Stress: Not on file  Social Connections: Not on file    Allergies:  Allergies  Allergen Reactions   Acetaminophen  Other (See Comments)    Liver function per MD    Levothyroxine Hives and Itching   Nabumetone Rash    Metabolic Disorder Labs: No results found for: HGBA1C, MPG No results found for: PROLACTIN No results found for: CHOL, TRIG, HDL, CHOLHDL, VLDL, LDLCALC Lab Results  Component Value Date   TSH 3.26 10/17/2019   TSH 4.540 (H) 10/28/2007    Therapeutic Level Labs: No results found for: LITHIUM No results found for: VALPROATE No results found for: CBMZ  Current Medications: Current  Outpatient Medications  Medication Sig Dispense Refill   albuterol  (PROVENTIL  HFA;VENTOLIN  HFA) 108 (90 Base) MCG/ACT inhaler Inhale 2 puffs into the lungs every 6 (six) hours as needed for wheezing or shortness of breath.     ALPRAZolam  (XANAX ) 1 MG tablet Take 1 tablet (1 mg total) by mouth 4 (four) times daily. 120 tablet 2   amitriptyline  (ELAVIL ) 10 MG tablet Take 1 tablet (10 mg total) by mouth at bedtime as needed for sleep. 30 tablet 2   apixaban  (ELIQUIS ) 5 MG TABS tablet TAKE ONE TABLET BY MOUTH TWICE DAILY 60 tablet 5   ARIPiprazole  (ABILIFY ) 2 MG tablet Take 1 tablet (2 mg total) by mouth daily. 30 tablet 2   ascorbic acid (VITAMIN C) 500 MG tablet Take 1,000 mg by mouth daily.     cephALEXin (KEFLEX) 500 MG capsule Take 500 mg by mouth 3 (three) times daily.     cyanocobalamin 1000 MCG tablet Take 1,000 mcg by mouth 2 (two) times daily.     ELDERBERRY PO Take 1,000 mg by mouth daily.     FLUoxetine  (PROZAC ) 40 MG capsule Take 1 capsule (40 mg total) by mouth daily. 30 capsule 3   Fluticasone -Umeclidin-Vilant 100-62.5-25 MCG/INH AEPB Inhale 1 puff into the lungs daily.     gabapentin  (NEURONTIN ) 300 MG capsule Take 300-600 mg by mouth See admin instructions. Take 300 mg in the morning and 600 mg at bedtime     lansoprazole  (PREVACID ) 30 MG capsule Take 1 capsule (30 mg total) by mouth 2 (two) times daily before a meal. 60 capsule 1   linaclotide  (LINZESS ) 290 MCG CAPS capsule TAKE ONE CAPSULE BY MOUTH ONCE DAILY BEFORE BREAKFAST. (Patient taking differently: Take 290 mcg by mouth daily as needed (constipation).) 90 capsule 0   loratadine  (CLARITIN ) 10 MG tablet Take 1 tablet by mouth daily.     metoCLOPramide  (REGLAN ) 5 MG tablet Take 1 tablet (5 mg total) by mouth every 8 (eight) hours as needed for nausea (headache). 20 tablet 0   metoprolol  succinate (TOPROL -XL) 50 MG 24 hr tablet Take 1 tablet (50 mg total) by mouth daily. 30 tablet 6   Multiple Vitamin (MULTI-VITAMIN PO) Take 1  tablet by mouth daily.     nystatin (MYCOSTATIN) powder Apply 1 g topically daily as needed (rash).  2   polyethylene glycol (MIRALAX )  17 g packet Take 17 g by mouth in the morning.     Potassium 99 MG TABS Take 99 mg by mouth daily.     predniSONE (DELTASONE) 10 MG tablet Take 10 mg by mouth 2 (two) times daily.     rosuvastatin  (CRESTOR ) 10 MG tablet Take 1 tablet (10 mg total) by mouth daily. 90 tablet 1   Simethicone  (GAS RELIEF PO) Take 2 capsules by mouth as needed (flatulence).      temazepam  (RESTORIL ) 30 MG capsule Take 1 capsule (30 mg total) by mouth at bedtime. 30 capsule 2   traMADol  (ULTRAM ) 50 MG tablet Take 50 mg by mouth 3 (three) times daily as needed for severe pain.     No current facility-administered medications for this visit.     Musculoskeletal: Strength & Muscle Tone: na Gait & Station: na Patient leans: N/A  Psychiatric Specialty Exam: Review of Systems  Psychiatric/Behavioral:  Positive for dysphoric mood.   All other systems reviewed and are negative.   There were no vitals taken for this visit.There is no height or weight on file to calculate BMI.  General Appearance: na  Eye Contact:  NA  Speech:  Clear and Coherent  Volume:  Normal  Mood:  Dysphoric  Affect:  NA  Thought Process:  Goal Directed  Orientation:  Full (Time, Place, and Person)  Thought Content: Rumination   Suicidal Thoughts:  No  Homicidal Thoughts:  No  Memory:  Immediate;   Good Recent;   Good Remote;   NA  Judgement:  Good  Insight:  Fair  Psychomotor Activity:  Normal  Concentration:  Concentration: Good and Attention Span: Good  Recall:  Good  Fund of Knowledge: Good  Language: Good  Akathisia:  No  Handed:  Right  AIMS (if indicated): not done  Assets:  Communication Skills Resilience Social Support  ADL's:  Intact  Cognition: WNL  Sleep:  Good   Screenings: PHQ2-9    Flowsheet Row Video Visit from 11/14/2021 in [facility] at  [place] Video Visit from 07/29/2021 in [facility] at [place] Video Visit from 05/07/2021 in [facility] at [place] Video Visit from 12/31/2020 in [facility] at [place] Video Visit from 08/27/2020 in [facility] at [facility] Total Score 0 1 1 0 1   Flowsheet Row Admission (Discharged) from 06/08/2023 in [facility] CARDIAC CATH LAB ED from 03/16/2023 in [facility] at [facility] Admission (Discharged) from 02/06/2023 in [facility]  C-SSRS RISK CATEGORY No Risk No Risk No Risk     Assessment and Plan: This patient is a 73 year old female with a history of depression and anxiety.  She is worried about her friend but overall she is functioning fairly well.  She will continue Prozac  40 mg daily for depression as well as Abilify  2 mg daily for antidepressant augmentation.  She will continue Xanax  1 mg 4 times daily for anxiety temazepam  30 mg at bedtime for sleep and amitriptyline  10 mg at bedtime only as needed for sleep or functional abdominal pain.  She will return to see me in 3 months  Collaboration of Care: Collaboration of Care: Other provider involved in patient's care AEB notes are shared with cardiology on the epic system  Patient/Guardian was advised Release of Information must be obtained prior to any record release in order to collaborate their care with  an outside provider. Patient/Guardian was advised if they have not already done so to contact the registration department to sign all necessary forms in order for us  to release information regarding their care.   Consent: Patient/Guardian gives verbal consent for treatment and assignment of benefits for services provided during this visit. Patient/Guardian expressed understanding and agreed to proceed.    Barnie Gull, MD 12/03/2023, 9:54 AM

## 2023-12-07 ENCOUNTER — Other Ambulatory Visit (HOSPITAL_BASED_OUTPATIENT_CLINIC_OR_DEPARTMENT_OTHER): Payer: Self-pay

## 2023-12-07 ENCOUNTER — Ambulatory Visit: Admitting: Nurse Practitioner

## 2023-12-07 NOTE — Progress Notes (Deleted)
 Cardiology Office Note:  .   Date:  04/23/2023 ID:  [name], DOB Sep 03, 1950, MRN [id number] [name]: Monique Leta NOVAK, MD  [facility] Providers Cardiologist:  Monique Sierras, MD Electrophysiologist:  Monique FORBES Furbish, MD    History of Present Illness: .   [name] is a 73 y.o. female with a PMH of A-flutter, frequent PVCs, hypertension, Barrett's esophagus, GERD, IBS, hepatitis C, s/p treatment in remission, obesity, depression/anxiety, COPD, hypothyroidism, who presents today for follow-up.    She was newly diagnosed with new onset A-flutter while presenting to facility for scheduled EGD, previous ZIO monitor confirmed A-fib/flutter with heart rate in 50s -see full report below.  Last seen by Monique Lunch, NP on December 30, 2022.  Patient noted worsening shortness of breath, fatigue, and sleepiness.  Her previous EGD was canceled due to new onset A-fib.  EKG in office that day revealed a flutter with varying AV block, was found to be rate controlled on beta-blocker therapy.  In office that day she was started on Eliquis  5 mg twice daily for CHA2DS2-VASc score is 3, aspirin  was discontinued.  Medication compliance was discussed with patient and family and to be scheduled for DCCV if she remained in A-fib at 3 to 4 weeks.  Echocardiogram was arranged for her shortness of breath/DOE-LVEF 65 to 70%, mitral valve was found to have trivial leakage, no other valve abnormalities noted.  Study was reassuring.  01/30/2023 - Today she presents for 4 week follow-up. She continues to endorse similar/stable shortness of breath and fatigue, says there has been no change in her symptoms. Reports she had an episode of chest pain last week, says it feels like I got heartburn. Difficult for patient to describe. Says she can feel her A-fib, and that is when her chest hurts. Pt has fallen twice since last office visit, denies any acute injuries, says she bruised her right knee. Denies any syncope,  presyncope, dizziness, orthopnea, PND, swelling or significant weight changes, acute bleeding, or claudication.   She underwent cardioversion on February 06, 2023.    02/26/2023 - Today she presents for follow-up.  She says after cardioversion when she was out of A-fib she felt better, however now she feels tired, lacks energy and believes she is back in A-fib. Denies any chest pain, palpitations, syncope, presyncope, dizziness, orthopnea, PND, swelling or significant weight changes, acute bleeding, or claudication.  Continues to note similar/stable shortness of breath, similar to last office visit.  She admits to jitteriness, anxiety that she wonders is related to one of her cardiac medications.  She has not had any falls, but admits to some near falls.  Says she has recently been having anxiety/stuttering, says she has never experienced stuttering before.  Visited ED on 03/16/2023 after having multiple falls, attributed to her right knee giving out. CT imaging negative for anything acute. Was tx for cellulitis. Advised about how to treat her HA.    Saw [name] on 04/13/2023. Was scheduled for A-fib ablation.   Today she presents for follow-up. Recently getting over the flu. Has been dealing with mid chest congestion, denies any chest pain. She has been taking antibiotic that was prescribed by [name], has not completed therapy yet. Denies any palpitations, syncope, presyncope, dizziness, orthopnea, PND, swelling or significant weight changes, acute bleeding, or claudication. Does admit to some shortness of breath related to her recent illness, denies any red flag signs/symptoms.   ROS: Negative. See HPI.   Studies Reviewed: .  EKG:      Echo 01/2023:   1. Left ventricular ejection fraction, by estimation, is 65 to 70%. The  left ventricle has normal function. The left ventricle has no regional  wall motion abnormalities. There is mild concentric left ventricular  hypertrophy. Left  ventricular diastolic  parameters are indeterminate.   2. Right ventricular systolic function is low normal. The right  ventricular size is normal. There is normal pulmonary artery systolic  pressure. The estimated right ventricular systolic pressure is 24.0 mmHg.   3. Left atrial size was severely dilated.   4. Right atrial size was severely dilated.   5. The mitral valve is degenerative. Trivial mitral valve regurgitation.   6. The aortic valve is tricuspid. There is mild calcification of the  aortic valve. Aortic valve regurgitation is not visualized.   7. The inferior vena cava is normal in size with greater than 50%  respiratory variability, suggesting right atrial pressure of 3 mmHg.   Comparison(s): No significant change from prior study. Prior images  reviewed side by side.  Cardiac monitor 12/2022:  Predominant rhythm is coarse atrial fibrillation/flutter with heart rate ranging from 38 bpm up to 146 bpm and average heart rate 58 bpm. Frequent PVCs were noted representing 19.8% total beats with otherwise rare ventricular couplets and triplets and limited episodes of ventricular bigeminy and trigeminy.  Single, 6 beat burst of NSVT was noted.  There were no sustained arrhythmias. No pauses or high degree heart block.  Lexiscan  01/2019:  There was no ST segment deviation noted during stress. Normal myocardial perfusion. Extensive soft tissue attenuation noted. This is an intermediate risk study, primarily based on reduced LVEF. However, LV function appears grossly normal and the reduced LVEF may be due to gating artifact. If echocardiogram is performed and LV systolic function is found to be normal, this would be considered a low risk study. Nuclear stress EF: 37%. Risk Assessment/Calculations:    CHA2DS2-VASc Score = 3   This indicates a 3.2% annual risk of stroke. The patient's score is based upon: CHF History: 0 HTN History: 1 Diabetes History: 0 Stroke History:  0 Vascular Disease History: 0 Age Score: 1 Gender Score: 1   Physical Exam:   VS:  There were no vitals taken for this visit.   Wt Readings from Last 3 Encounters:  08/21/23 207 lb 9.6 oz (94.2 kg)  07/06/23 195 lb 9.6 oz (88.7 kg)  06/08/23 196 lb (88.9 kg)    GEN: Obese, 73 y.o. female in no acute distress NECK: No JVD; No carotid bruits CARDIAC: S1/S2, irregularly irregular rhythm, no murmurs, rubs, gallops RESPIRATORY:  Clear to auscultation without rales, wheezing or rhonchi  ABDOMEN: Soft, non-tender, non-distended EXTREMITIES:  No edema; No deformity   ASSESSMENT AND PLAN: .    A-fib/A-flutter Remains in A-flutter, denies any palpitations or tachycardia. Continue current medication regimen. Continue Eliquis  due to CHA2DS2-VASc score 3.  She is on appropriate dosage and denies any bleeding issues. Scheduled for ablation next month. Continue to follow-up with EP.   2. HTN Blood pressure stable. Discussed to monitor BP at home at least 2 hours after medications and sitting for 5-10 minutes.  No medication changes at this time.    3. COPD, chest congestion/URI? Does admit to some shortness of breath and chest congestion related to recent illness, being managed by [name]. EKG is negative for anything acute. Recommended to f/u with [name] who is managing this. Continue follow-up [name].  No medication changes at this  time.   Dispo: Follow-up with me/[facility] in 4-6 months or sooner if anything changes.   Signed, Almarie Crate, NP

## 2023-12-08 ENCOUNTER — Ambulatory Visit: Attending: Nurse Practitioner | Admitting: Nurse Practitioner

## 2023-12-08 ENCOUNTER — Encounter: Payer: Self-pay | Admitting: Nurse Practitioner

## 2023-12-08 VITALS — BP 124/80 | HR 86 | Ht 63.0 in | Wt 209.2 lb

## 2023-12-08 DIAGNOSIS — J449 Chronic obstructive pulmonary disease, unspecified: Secondary | ICD-10-CM | POA: Diagnosis not present

## 2023-12-08 DIAGNOSIS — I483 Typical atrial flutter: Secondary | ICD-10-CM

## 2023-12-08 DIAGNOSIS — D696 Thrombocytopenia, unspecified: Secondary | ICD-10-CM | POA: Diagnosis not present

## 2023-12-08 DIAGNOSIS — I4891 Unspecified atrial fibrillation: Secondary | ICD-10-CM

## 2023-12-08 DIAGNOSIS — I10 Essential (primary) hypertension: Secondary | ICD-10-CM

## 2023-12-08 MED ORDER — METOPROLOL SUCCINATE ER 50 MG PO TB24: 50.0000 mg | ORAL_TABLET | Freq: Every day | ORAL | 6 refills | Status: AC

## 2023-12-08 NOTE — Progress Notes (Deleted)
 Cardiology Office Note:  .   Date:  04/23/2023 ID:  [name], DOB 09-Sep-1950, MRN [id number] PCP: Rosamond Leta NOVAK, MD  [facility] Providers Cardiologist:  [name], MD Electrophysiologist:  [name], MD    History of Present Illness: .   [name] is a 73 y.o. female with a PMH of A-flutter, frequent PVCs, hypertension, Barrett's esophagus, GERD, IBS, hepatitis C, s/p treatment in remission, obesity, depression/anxiety, COPD, hypothyroidism, who presents today for follow-up.    She was newly diagnosed with new onset A-flutter while presenting to facility for scheduled EGD, previous ZIO monitor confirmed A-fib/flutter with heart rate in 50s -see full report below.  Last seen by Tylene Lunch, NP on December 30, 2022.  Patient noted worsening shortness of breath, fatigue, and sleepiness.  Her previous EGD was canceled due to new onset A-fib.  EKG in office that day revealed a flutter with varying AV block, was found to be rate controlled on beta-blocker therapy.  In office that day she was started on Eliquis  5 mg twice daily for CHA2DS2-VASc score is 3, aspirin  was discontinued.  Medication compliance was discussed with patient and family and to be scheduled for DCCV if she remained in A-fib at 3 to 4 weeks.  Echocardiogram was arranged for her shortness of breath/DOE-LVEF 65 to 70%, mitral valve was found to have trivial leakage, no other valve abnormalities noted.  Study was reassuring.  01/30/2023 - Today she presents for 4 week follow-up. She continues to endorse similar/stable shortness of breath and fatigue, says there has been no change in her symptoms. Reports she had an episode of chest pain last week, says it feels like I got heartburn. Difficult for patient to describe. Says she can feel her A-fib, and that is when her chest hurts. Pt has fallen twice since last office visit, denies any acute injuries, says she bruised her right knee. Denies any syncope,  presyncope, dizziness, orthopnea, PND, swelling or significant weight changes, acute bleeding, or claudication.   She underwent cardioversion on February 06, 2023.    02/26/2023 - Today she presents for follow-up.  She says after cardioversion when she was out of A-fib she felt better, however now she feels tired, lacks energy and believes she is back in A-fib. Denies any chest pain, palpitations, syncope, presyncope, dizziness, orthopnea, PND, swelling or significant weight changes, acute bleeding, or claudication.  Continues to note similar/stable shortness of breath, similar to last office visit.  She admits to jitteriness, anxiety that she wonders is related to one of her cardiac medications.  She has not had any falls, but admits to some near falls.  Says she has recently been having anxiety/stuttering, says she has never experienced stuttering before.  Visited ED on 03/16/2023 after having multiple falls, attributed to her right knee giving out. CT imaging negative for anything acute. Was tx for cellulitis. Advised about how to treat her HA.    Saw Dr. Furbish on 04/13/2023. Was scheduled for A-fib ablation.   Today she presents for follow-up. Recently getting over the flu. Has been dealing with mid chest congestion, denies any chest pain. She has been taking antibiotic that was prescribed by PCP, has not completed therapy yet. Denies any palpitations, syncope, presyncope, dizziness, orthopnea, PND, swelling or significant weight changes, acute bleeding, or claudication. Does admit to some shortness of breath related to her recent illness, denies any red flag signs/symptoms.   ROS: Negative. See HPI.   Studies Reviewed: .  EKG:      Echo 01/2023:   1. Left ventricular ejection fraction, by estimation, is 65 to 70%. The  left ventricle has normal function. The left ventricle has no regional  wall motion abnormalities. There is mild concentric left ventricular  hypertrophy. Left  ventricular diastolic  parameters are indeterminate.   2. Right ventricular systolic function is low normal. The right  ventricular size is normal. There is normal pulmonary artery systolic  pressure. The estimated right ventricular systolic pressure is 24.0 mmHg.   3. Left atrial size was severely dilated.   4. Right atrial size was severely dilated.   5. The mitral valve is degenerative. Trivial mitral valve regurgitation.   6. The aortic valve is tricuspid. There is mild calcification of the  aortic valve. Aortic valve regurgitation is not visualized.   7. The inferior vena cava is normal in size with greater than 50%  respiratory variability, suggesting right atrial pressure of 3 mmHg.   Comparison(s): No significant change from prior study. Prior images  reviewed side by side.  Cardiac monitor 12/2022:  Predominant rhythm is coarse atrial fibrillation/flutter with heart rate ranging from 38 bpm up to 146 bpm and average heart rate 58 bpm. Frequent PVCs were noted representing 19.8% total beats with otherwise rare ventricular couplets and triplets and limited episodes of ventricular bigeminy and trigeminy.  Single, 6 beat burst of NSVT was noted.  There were no sustained arrhythmias. No pauses or high degree heart block.  Lexiscan  01/2019:  There was no ST segment deviation noted during stress. Normal myocardial perfusion. Extensive soft tissue attenuation noted. This is an intermediate risk study, primarily based on reduced LVEF. However, LV function appears grossly normal and the reduced LVEF may be due to gating artifact. If echocardiogram is performed and LV systolic function is found to be normal, this would be considered a low risk study. Nuclear stress EF: 37%. Risk Assessment/Calculations:    CHA2DS2-VASc Score = 3   This indicates a 3.2% annual risk of stroke. The patient's score is based upon: CHF History: 0 HTN History: 1 Diabetes History: 0 Stroke History:  0 Vascular Disease History: 0 Age Score: 1 Gender Score: 1   Physical Exam:   VS:  BP 124/80   Pulse 86   Ht 5' 3 (1.6 m)   Wt 209 lb 3.2 oz (94.9 kg)   SpO2 98%   BMI 37.06 kg/m    Wt Readings from Last 3 Encounters:  12/08/23 209 lb 3.2 oz (94.9 kg)  08/21/23 207 lb 9.6 oz (94.2 kg)  07/06/23 195 lb 9.6 oz (88.7 kg)    GEN: Obese, 73 y.o. female in no acute distress NECK: No JVD; No carotid bruits CARDIAC: S1/S2, irregularly irregular rhythm, no murmurs, rubs, gallops RESPIRATORY:  Clear to auscultation without rales, wheezing or rhonchi  ABDOMEN: Soft, non-tender, non-distended EXTREMITIES:  No edema; No deformity   ASSESSMENT AND PLAN: .    A-fib/A-flutter Remains in A-flutter, denies any palpitations or tachycardia. Continue current medication regimen. Continue Eliquis  due to CHA2DS2-VASc score 3.  She is on appropriate dosage and denies any bleeding issues. Scheduled for ablation next month. Continue to follow-up with EP.   2. HTN Blood pressure stable. Discussed to monitor BP at home at least 2 hours after medications and sitting for 5-10 minutes.  No medication changes at this time.    3. COPD, chest congestion/URI? Does admit to some shortness of breath and chest congestion related to recent illness, being managed  by PCP. EKG is negative for anything acute. Recommended to f/u with PCP who is managing this. Continue follow-up PCP.  No medication changes at this time.   Dispo: Follow-up with me/APP in 4-6 months or sooner if anything changes.   Signed, Almarie Crate, NP

## 2023-12-08 NOTE — Progress Notes (Signed)
 Cardiology Office Note:  .   Date:  12/08/2023 ID:  [name], DOB 1950/10/10, MRN [id number] PCP: Rosamond Leta NOVAK, MD  [facility] Providers Cardiologist:  [name], MD Electrophysiologist:  [name], MD    History of Present Illness: .   DAYLE SHERPA is a 73 y.o. female with a PMH of A-flutter, frequent PVCs, hypertension, Barrett's esophagus, GERD, IBS, hepatitis C, s/p treatment in remission, obesity, depression/anxiety, COPD, hypothyroidism, who presents today for 4-6 month follow-up.    She was newly diagnosed with new onset A-flutter while presenting to facility for scheduled EGD, previous ZIO monitor confirmed A-fib/flutter with heart rate in 50s -see full report below.  Last seen by Tylene Lunch, NP on December 30, 2022.  Patient noted worsening shortness of breath, fatigue, and sleepiness.  Her previous EGD was canceled due to new onset A-fib.  EKG in office that day revealed a flutter with varying AV block, was found to be rate controlled on beta-blocker therapy.  In office that day she was started on Eliquis  5 mg twice daily for CHA2DS2-VASc score is 3, aspirin  was discontinued.  Medication compliance was discussed with patient and family and to be scheduled for DCCV if she remained in A-fib at 3 to 4 weeks.  Echocardiogram was arranged for her shortness of breath/DOE-LVEF 65 to 70%, mitral valve was found to have trivial leakage, no other valve abnormalities noted.  Study was reassuring.  01/30/2023 - Today she presents for 4 week follow-up. She continues to endorse similar/stable shortness of breath and fatigue, says there has been no change in her symptoms. Reports she had an episode of chest pain last week, says it feels like I got heartburn. Difficult for patient to describe. Says she can feel her A-fib, and that is when her chest hurts. Pt has fallen twice since last office visit, denies any acute injuries, says she bruised her right knee. Denies any  syncope, presyncope, dizziness, orthopnea, PND, swelling or significant weight changes, acute bleeding, or claudication.   She underwent cardioversion on February 06, 2023.    02/26/2023 - Today she presents for follow-up.  She says after cardioversion when she was out of A-fib she felt better, however now she feels tired, lacks energy and believes she is back in A-fib. Denies any chest pain, palpitations, syncope, presyncope, dizziness, orthopnea, PND, swelling or significant weight changes, acute bleeding, or claudication.  Continues to note similar/stable shortness of breath, similar to last office visit.  She admits to jitteriness, anxiety that she wonders is related to one of her cardiac medications.  She has not had any falls, but admits to some near falls.  Says she has recently been having anxiety/stuttering, says she has never experienced stuttering before.  Visited ED on 03/16/2023 after having multiple falls, attributed to her right knee giving out. CT imaging negative for anything acute. Was tx for cellulitis. Advised about how to treat her HA.    Saw Dr. Furbish on 04/13/2023. Was scheduled for A-fib ablation.   04/23/2023 - Today she presents for follow-up. Recently getting over the flu. Has been dealing with mid chest congestion, denies any chest pain. She has been taking antibiotic that was prescribed by PCP, has not completed therapy yet. Denies any palpitations, syncope, presyncope, dizziness, orthopnea, PND, swelling or significant weight changes, acute bleeding, or claudication. Does admit to some shortness of breath related to her recent illness, denies any red flag signs/symptoms.   Underwent A-fib ablation on 06/08/2023. Followed  by EP and A-fib clinic.  12/08/2023 - Here for follow-up. Doing well and denies any acute cardiac complaints or issues. Denies any chest pain, shortness of breath, palpitations, syncope, presyncope, dizziness, orthopnea, PND, swelling or significant weight  changes, acute bleeding, or claudication.   ROS: Negative. See HPI.   Studies Reviewed: SABRA    EKG: EKG is not ordered today.   Echo 01/2023:   1. Left ventricular ejection fraction, by estimation, is 65 to 70%. The  left ventricle has normal function. The left ventricle has no regional  wall motion abnormalities. There is mild concentric left ventricular  hypertrophy. Left ventricular diastolic  parameters are indeterminate.   2. Right ventricular systolic function is low normal. The right  ventricular size is normal. There is normal pulmonary artery systolic  pressure. The estimated right ventricular systolic pressure is 24.0 mmHg.   3. Left atrial size was severely dilated.   4. Right atrial size was severely dilated.   5. The mitral valve is degenerative. Trivial mitral valve regurgitation.   6. The aortic valve is tricuspid. There is mild calcification of the  aortic valve. Aortic valve regurgitation is not visualized.   7. The inferior vena cava is normal in size with greater than 50%  respiratory variability, suggesting right atrial pressure of 3 mmHg.   Comparison(s): No significant change from prior study. Prior images  reviewed side by side.  Cardiac monitor 12/2022:  Predominant rhythm is coarse atrial fibrillation/flutter with heart rate ranging from 38 bpm up to 146 bpm and average heart rate 58 bpm. Frequent PVCs were noted representing 19.8% total beats with otherwise rare ventricular couplets and triplets and limited episodes of ventricular bigeminy and trigeminy.  Single, 6 beat burst of NSVT was noted.  There were no sustained arrhythmias. No pauses or high degree heart block.  Lexiscan  01/2019:  There was no ST segment deviation noted during stress. Normal myocardial perfusion. Extensive soft tissue attenuation noted. This is an intermediate risk study, primarily based on reduced LVEF. However, LV function appears grossly normal and the reduced LVEF may be due to  gating artifact. If echocardiogram is performed and LV systolic function is found to be normal, this would be considered a low risk study. Nuclear stress EF: 37%. Risk Assessment/Calculations:    CHA2DS2-VASc Score = 3   This indicates a 3.2% annual risk of stroke. The patient's score is based upon: CHF History: 0 HTN History: 1 Diabetes History: 0 Stroke History: 0 Vascular Disease History: 0 Age Score: 1 Gender Score: 1   Physical Exam:   VS:  BP 124/80   Pulse 86   Ht 5' 3 (1.6 m)   Wt 209 lb 3.2 oz (94.9 kg)   SpO2 98%   BMI 37.06 kg/m    Wt Readings from Last 3 Encounters:  12/08/23 209 lb 3.2 oz (94.9 kg)  08/21/23 207 lb 9.6 oz (94.2 kg)  07/06/23 195 lb 9.6 oz (88.7 kg)    GEN: Obese, 73 y.o. female in no acute distress NECK: No JVD; No carotid bruits CARDIAC: S1/S2, RRR, no murmurs, rubs, gallops RESPIRATORY:  Clear to auscultation without rales, wheezing or rhonchi  ABDOMEN: Soft, non-tender, non-distended EXTREMITIES:  No edema; No deformity   ASSESSMENT AND PLAN: .    A-fib/A-flutter, s/p ablation 05/2023 Denies any palpitations or tachycardia. HR is well controlled today. Continue current medication regimen. Continue Eliquis  due to CHA2DS2-VASc score 3.  She is on appropriate dosage and denies any bleeding issues. Continue to  follow-up with EP and A-fib Clinic.   2. HTN Blood pressure stable. Discussed to monitor BP at home at least 2 hours after medications and sitting for 5-10 minutes.  No medication changes at this time.    3. COPD Denies any acute shortness of breath or recent worsening exacerbations. Continue to follow with PCP.  4. Thrombocytopenia Chronic hx. Most recent platelet count 53,000. Denies any acute bleeding issues currently. Previously seen by Hematology in the past. Will place referral for further management.   Dispo: Will provide refill per her request. Follow-up with MD/APP in 6 months or sooner if anything changes.    Signed, Almarie Crate, NP

## 2023-12-08 NOTE — Patient Instructions (Addendum)

## 2023-12-09 DIAGNOSIS — E039 Hypothyroidism, unspecified: Secondary | ICD-10-CM | POA: Diagnosis not present

## 2023-12-09 DIAGNOSIS — Z79899 Other long term (current) drug therapy: Secondary | ICD-10-CM | POA: Diagnosis not present

## 2023-12-09 DIAGNOSIS — E7800 Pure hypercholesterolemia, unspecified: Secondary | ICD-10-CM | POA: Diagnosis not present

## 2023-12-16 ENCOUNTER — Ambulatory Visit (HOSPITAL_COMMUNITY)
Admission: RE | Admit: 2023-12-16 | Discharge: 2023-12-16 | Disposition: A | Discharge status: Home / Self Care | Source: Ambulatory Visit | Attending: Gastroenterology | Admitting: Gastroenterology

## 2023-12-16 DIAGNOSIS — K7460 Unspecified cirrhosis of liver: Secondary | ICD-10-CM | POA: Diagnosis not present

## 2023-12-16 DIAGNOSIS — D61818 Other pancytopenia: Secondary | ICD-10-CM | POA: Diagnosis not present

## 2023-12-16 DIAGNOSIS — K7469 Other cirrhosis of liver: Secondary | ICD-10-CM | POA: Insufficient documentation

## 2023-12-16 DIAGNOSIS — K7581 Nonalcoholic steatohepatitis (NASH): Secondary | ICD-10-CM | POA: Insufficient documentation

## 2023-12-17 ENCOUNTER — Ambulatory Visit (INDEPENDENT_AMBULATORY_CARE_PROVIDER_SITE_OTHER): Payer: Self-pay | Admitting: Gastroenterology

## 2023-12-25 ENCOUNTER — Ambulatory Visit: Payer: Self-pay | Admitting: Oncology

## 2023-12-25 ENCOUNTER — Inpatient Hospital Stay

## 2023-12-25 ENCOUNTER — Inpatient Hospital Stay: Attending: Oncology | Admitting: Oncology

## 2023-12-25 ENCOUNTER — Encounter: Payer: Self-pay | Admitting: Oncology

## 2023-12-25 VITALS — BP 129/75 | HR 68 | Temp 97.8°F | Resp 22 | Ht 63.0 in | Wt 207.7 lb

## 2023-12-25 DIAGNOSIS — D693 Immune thrombocytopenic purpura: Secondary | ICD-10-CM | POA: Insufficient documentation

## 2023-12-25 DIAGNOSIS — D649 Anemia, unspecified: Secondary | ICD-10-CM | POA: Diagnosis not present

## 2023-12-25 DIAGNOSIS — K766 Portal hypertension: Secondary | ICD-10-CM | POA: Insufficient documentation

## 2023-12-25 DIAGNOSIS — I4819 Other persistent atrial fibrillation: Secondary | ICD-10-CM

## 2023-12-25 DIAGNOSIS — K7581 Nonalcoholic steatohepatitis (NASH): Secondary | ICD-10-CM

## 2023-12-25 DIAGNOSIS — Z8619 Personal history of other infectious and parasitic diseases: Secondary | ICD-10-CM

## 2023-12-25 DIAGNOSIS — Z79899 Other long term (current) drug therapy: Secondary | ICD-10-CM | POA: Insufficient documentation

## 2023-12-25 DIAGNOSIS — D696 Thrombocytopenia, unspecified: Secondary | ICD-10-CM | POA: Diagnosis not present

## 2023-12-25 DIAGNOSIS — K7469 Other cirrhosis of liver: Secondary | ICD-10-CM | POA: Diagnosis not present

## 2023-12-25 DIAGNOSIS — Z6836 Body mass index (BMI) 36.0-36.9, adult: Secondary | ICD-10-CM

## 2023-12-25 DIAGNOSIS — R161 Splenomegaly, not elsewhere classified: Secondary | ICD-10-CM | POA: Insufficient documentation

## 2023-12-25 DIAGNOSIS — B1920 Unspecified viral hepatitis C without hepatic coma: Secondary | ICD-10-CM | POA: Diagnosis not present

## 2023-12-25 DIAGNOSIS — D72819 Decreased white blood cell count, unspecified: Secondary | ICD-10-CM | POA: Diagnosis not present

## 2023-12-25 LAB — COMPREHENSIVE METABOLIC PANEL WITH GFR
ALT: 18 U/L (ref 0–44)
AST: 35 U/L (ref 15–41)
Albumin: 3.7 g/dL (ref 3.5–5.0)
Alkaline Phosphatase: 124 U/L (ref 38–126)
Anion gap: 11 (ref 5–15)
BUN: 8 mg/dL (ref 8–23)
CO2: 27 mmol/L (ref 22–32)
Calcium: 9.4 mg/dL (ref 8.9–10.3)
Chloride: 106 mmol/L (ref 98–111)
Creatinine, Ser: 0.73 mg/dL (ref 0.44–1.00)
GFR, Estimated: >60 mL/min (ref >60)
Glucose, Bld: 116 mg/dL — ABNORMAL HIGH (ref 70–99)
Potassium: 3.8 mmol/L (ref 3.5–5.1)
Sodium: 144 mmol/L (ref 135–145)
Total Bilirubin: 0.8 mg/dL (ref 0.0–1.2)
Total Protein: 6.7 g/dL (ref 6.5–8.1)

## 2023-12-25 LAB — CBC WITH DIFFERENTIAL/PLATELET
Abs Immature Granulocytes: 0.01 K/uL (ref 0.00–0.07)
Basophils Absolute: 0 K/uL (ref 0.0–0.1)
Basophils Relative: 1 %
Eosinophils Absolute: 0.1 K/uL (ref 0.0–0.5)
Eosinophils Relative: 3 %
HCT: 34.7 % — ABNORMAL LOW (ref 36.0–46.0)
Hemoglobin: 11.4 g/dL — ABNORMAL LOW (ref 12.0–15.0)
Immature Granulocytes: 0 %
Lymphocytes Relative: 21 %
Lymphs Abs: 0.7 K/uL (ref 0.7–4.0)
MCH: 31.5 pg (ref 26.0–34.0)
MCHC: 32.9 g/dL (ref 30.0–36.0)
MCV: 95.9 fL (ref 80.0–100.0)
Monocytes Absolute: 0.3 K/uL (ref 0.1–1.0)
Monocytes Relative: 9 %
Neutro Abs: 2.1 K/uL (ref 1.7–7.7)
Neutrophils Relative %: 66 %
Platelets: 51 K/uL — ABNORMAL LOW (ref 150–400)
RBC: 3.62 MIL/uL — ABNORMAL LOW (ref 3.87–5.11)
RDW: 17.3 % — ABNORMAL HIGH (ref 11.5–15.5)
Smear Review: NORMAL
WBC: 3.2 K/uL — ABNORMAL LOW (ref 4.0–10.5)
nRBC: 0 % (ref 0.0–0.2)

## 2023-12-25 LAB — IRON AND TIBC
Iron: 87 ug/dL (ref 28–170)
Saturation Ratios: 23 % (ref 10.4–31.8)
TIBC: 388 ug/dL (ref 250–450)
UIBC: 301 ug/dL

## 2023-12-25 LAB — RETICULOCYTES
Immature Retic Fract: 14.1 % (ref 2.3–15.9)
RBC.: 3.57 MIL/uL — ABNORMAL LOW (ref 3.87–5.11)
Retic Count, Absolute: 144.9 K/uL (ref 19.0–186.0)
Retic Ct Pct: 4.1 % — ABNORMAL HIGH (ref 0.4–3.1)

## 2023-12-25 LAB — TECHNOLOGIST SMEAR REVIEW: Plt Morphology: NORMAL

## 2023-12-25 LAB — LACTATE DEHYDROGENASE: LDH: 177 U/L (ref 98–192)

## 2023-12-25 LAB — FOLATE: Folate: 9.5 ng/mL (ref >5.9)

## 2023-12-25 LAB — VITAMIN B12: Vitamin B-12: 1258 pg/mL — ABNORMAL HIGH (ref 180–914)

## 2023-12-25 LAB — FERRITIN: Ferritin: 31 ng/mL (ref 11–307)

## 2023-12-25 NOTE — Progress Notes (Signed)
 [facility]  Telephone:([phone]) [phone] [phone]    INITIAL HEMATOLOGY CONSULTATION  Referring Provider: Almarie Crate, NP  Reason for Referral: Thrombocytopenia   HPI: [name] is a 73 year old female with a past medical history significant for atrial fibrillation status post ablation currently on Eliquis , hypertension, COPD, depression, anxiety, hepatitis C, NASH, sleep apnea, splenomegaly who has been referred to hematology for evaluation of thrombocytopenia.  Her most recent CBC was completed on 06/08/2023 which showed WBC of 3.7, hemoglobin 13.2, MCV 95.8, platelet count 53,000.  She has been seen by hematology previously back in 2005/2006 for this issue.  However, records have not transferred over into epic and I cannot see the details of the notes.  Review of prior lab work shows the patient has had persistent thrombocytopenia dating back over 15 years.  Abdominal ultrasound obtained on 12/16/2023 showed cirrhotic liver morphology without focal hepatic lesion.  This was an ultrasound of the right upper quadrant so spleen was not visualized.  She did have an MRCP performed on 09/04/2021 which showed diffuse hepatic steatosis and marked splenomegaly with increased upper abdominal varicosities compatible with stigmata of portal venous hypertension.  Last EGD was performed on 09/16/2022 which showed grade 2 esophageal varices which were banded and portal hypertensive gastropathy.  The patient reports that she is experiencing fatigue.  She reports that her appetite is good and her weight has been stable.  She reports headaches which have been occurring behind her right eye and also has occasional right ear ringing.  She is not experiencing any fevers, chills, night sweats.  Denies chest pain.  She denies shortness of breath at rest but does have dyspnea with exertion.  She notices abdominal discomfort in her left upper quadrant.  She also reports that she has lower extremity  edema and is currently on a diuretic.  Her edema is improved when she elevates her legs.  She noticed a small amount of bleeding to her right ear yesterday but otherwise denies any other bleeding.  She does bruise easily.  She reports that a few years ago that she was evaluated for bariatric surgery but due to her cardiac disease, she was not a candidate.  The patient tells me that she was anemic on recent lab work.  However, I do not have a copy of this lab result.  She indicates that this lab was done about a week ago.   Past Medical History:  Diagnosis Date   Anxiety    Arthritis    Bronchitis    COPD (chronic obstructive pulmonary disease) (HCC)    Depression    Diverticulitis 04/2012   [facility]   Essential hypertension    Frequent PVCs    Outflow tract, positive in the inferior leads   GERD (gastroesophageal reflux disease)    Hepatitis C    Treated   History of pneumonia    Insomnia    Migraine headache    NASH (nonalcoholic steatohepatitis)    Sleep apnea    Splenomegaly    Thyroid  disease    Wears glasses   :  Past Surgical History:  Procedure Laterality Date   ABDOMINAL HYSTERECTOMY     ANKLE SURGERY Right    ATRIAL FIBRILLATION ABLATION N/A 06/08/2023   Procedure: ATRIAL FIBRILLATION ABLATION;  Surgeon: Nancey Eulas BRAVO, MD;  Location: [facility];  Service: Cardiovascular;  Laterality: N/A;   BIOPSY  10/05/2020   Procedure: BIOPSY;  Surgeon: Eartha Angelia Sieving, MD;  Location:  [facility];  Service: Gastroenterology;;   CARDIOVERSION N/A 02/06/2023   Procedure: CARDIOVERSION;  Surgeon: Stacia Diannah SQUIBB, MD;  Location: [facility] OR[facility];  Service: Cardiovascular;  Laterality: N/A;   COLON SURGERY     part of color removed   COLONOSCOPY     COLONOSCOPY WITH PROPOFOL  N/A 11/13/2020   Castaneda:single non bleeding colonic angiodysplastic lesion, diverticulosis in sigmoid, non bleeding external hemorrhoids, no specimens   ESOPHAGEAL BANDING   09/16/2022   Procedure: ESOPHAGEAL BANDING;  Surgeon: Eartha Angelia Sieving, MD;  Location: [facility];  Service: Gastroenterology;;   ESOPHAGEAL MANOMETRY N/A 04/10/2014   Procedure: ESOPHAGEAL MANOMETRY (EM);  Surgeon: Lupita FORBES Commander, MD;  Location: [facility];  Service: Endoscopy;  Laterality: N/A;   ESOPHAGOGASTRODUODENOSCOPY     ESOPHAGOGASTRODUODENOSCOPY (EGD) WITH PROPOFOL  N/A 10/05/2020   Castaneda: barrett's esophagus, short segment, 1cm hh, normal stomach, normal duodenum, biopsied, normal, no varices   ESOPHAGOGASTRODUODENOSCOPY (EGD) WITH PROPOFOL  N/A 09/05/2022   Procedure: ESOPHAGOGASTRODUODENOSCOPY (EGD) WITH PROPOFOL ;  Surgeon: Eartha Angelia Sieving, MD;  [facility]: [facility];  Service: Gastroenterology;  Laterality: N/A;  2:00PM;ASA 3   ESOPHAGOGASTRODUODENOSCOPY (EGD) WITH PROPOFOL  N/A 09/16/2022   Procedure: ESOPHAGOGASTRODUODENOSCOPY (EGD) WITH PROPOFOL ;  Surgeon: Eartha Angelia Sieving, MD;  Location: [facility];  Service: Gastroenterology;  Laterality: N/A;  10:00am;asa 3   INCISIONAL HERNIA REPAIR N/A 10/24/2015   Procedure: LAPAROSCOPIC REPAIR INCISIONAL HERNIA WITH MESH;  Surgeon: Dann Hummer, MD;  Location: [facility];  Service: General;  Laterality: N/A;   INSERTION OF MESH N/A 10/24/2015   Procedure: INSERTION OF MESH;  Surgeon: Dann Hummer, MD;  Location: [facility] OR;  Service: General;  Laterality: N/A;   JOINT REPLACEMENT     KNEE SURGERY Right    x4   PH IMPEDANCE STUDY N/A 04/10/2014   Procedure: PH IMPEDANCE STUDY;  Surgeon: Lupita FORBES Commander, MD;  Location: [facility];  Service: Endoscopy;  Laterality: N/A;   TOTAL SHOULDER ARTHROPLASTY Left 04/22/2018   Procedure: Left total shoulder arthroplasty;  Surgeon: Melita Drivers, MD;  Location: [facility]S;  Service: Orthopedics;  Laterality: Left;   :  CURRENT MEDS: Current Outpatient Medications  Medication Sig Dispense Refill   albuterol  (PROVENTIL  HFA;VENTOLIN  HFA) 108 (90 Base) MCG/ACT inhaler  Inhale 2 puffs into the lungs every 6 (six) hours as needed for wheezing or shortness of breath.     ALPRAZolam  (XANAX ) 1 MG tablet Take 1 tablet (1 mg total) by mouth 4 (four) times daily. 120 tablet 2   amitriptyline  (ELAVIL ) 10 MG tablet Take 1 tablet (10 mg total) by mouth at bedtime as needed for sleep. 30 tablet 2   apixaban  (ELIQUIS ) 5 MG TABS tablet TAKE ONE TABLET BY MOUTH TWICE DAILY 60 tablet 5   ARIPiprazole  (ABILIFY ) 2 MG tablet Take 1 tablet (2 mg total) by mouth daily. 30 tablet 2   ascorbic acid (VITAMIN C) 500 MG tablet Take 1,000 mg by mouth daily.     cephALEXin (KEFLEX) 500 MG capsule Take 500 mg by mouth 3 (three) times daily.     cyanocobalamin 1000 MCG tablet Take 1,000 mcg by mouth 2 (two) times daily.     ELDERBERRY PO Take 1,000 mg by mouth daily.     FLUoxetine  (PROZAC ) 40 MG capsule Take 1 capsule (40 mg total) by mouth daily. 30 capsule 3   Fluticasone -Umeclidin-Vilant 100-62.5-25 MCG/INH AEPB Inhale 1 puff into the lungs daily.     gabapentin  (NEURONTIN ) 300 MG capsule Take 300-600 mg by mouth See admin  instructions. Take 300 mg in the morning and 600 mg at bedtime     lansoprazole  (PREVACID ) 30 MG capsule Take 1 capsule (30 mg total) by mouth 2 (two) times daily before a meal. 60 capsule 1   linaclotide  (LINZESS ) 290 MCG CAPS capsule TAKE ONE CAPSULE BY MOUTH ONCE DAILY BEFORE BREAKFAST. (Patient taking differently: Take 290 mcg by mouth daily as needed (constipation).) 90 capsule 0   loratadine  (CLARITIN ) 10 MG tablet Take 1 tablet by mouth daily.     metoCLOPramide  (REGLAN ) 5 MG tablet Take 1 tablet (5 mg total) by mouth every 8 (eight) hours as needed for nausea (headache). 20 tablet 0   metoprolol  succinate (TOPROL -XL) 50 MG 24 hr tablet Take 1 tablet (50 mg total) by mouth daily. 30 tablet 6   Multiple Vitamin (MULTI-VITAMIN PO) Take 1 tablet by mouth daily.     nystatin (MYCOSTATIN) powder Apply 1 g topically daily as needed (rash).  2   polyethylene glycol  (MIRALAX ) 17 g packet Take 17 g by mouth in the morning.     Potassium 99 MG TABS Take 99 mg by mouth daily.     predniSONE (DELTASONE) 10 MG tablet Take 10 mg by mouth 2 (two) times daily.     rosuvastatin  (CRESTOR ) 10 MG tablet Take 1 tablet (10 mg total) by mouth daily. 90 tablet 1   Simethicone  (GAS RELIEF PO) Take 2 capsules by mouth as needed (flatulence).      temazepam  (RESTORIL ) 30 MG capsule Take 1 capsule (30 mg total) by mouth at bedtime. 30 capsule 2   traMADol  (ULTRAM ) 50 MG tablet Take 50 mg by mouth 3 (three) times daily as needed for severe pain.     No current facility-administered medications for this visit.    Allergies  Allergen Reactions   Acetaminophen  Other (See Comments)    Liver function per MD    Levothyroxine Hives and Itching   Nabumetone Rash  :  Family History  Problem Relation Age of Onset   Breast cancer Mother    Heart failure Mother    Alcohol abuse Father    Dementia Father    Depression Brother    Drug abuse Brother    Other Brother    Skin cancer Brother    Alcohol abuse Paternal Uncle    Depression Paternal Uncle    Dementia Maternal Grandmother    Bipolar disorder Daughter    Anxiety disorder Daughter    ADD / ADHD Neg Hx   :  Social History   Socioeconomic History   Marital status: Widowed    Spouse name: Not on file   Number of children: 2   Years of education: 12th grade   Highest education level: Not on file  Occupational History   Occupation: RETIRED  Tobacco Use   Smoking status: Never    Passive exposure: Current   Smokeless tobacco: Never   Tobacco comments:    Never smoked 07/06/23  Vaping Use   Vaping status: Never Used  Substance and Sexual Activity   Alcohol use: Yes    Alcohol/week: 3.0 standard drinks of alcohol    Types: 3 Cans of beer per week    Comment: occasionally   Drug use: No   Sexual activity: Never  Other Topics Concern   Not on file  Social History Narrative   Not on file   Social  Drivers of Health   Financial Resource Strain: Not on file  Food Insecurity: No Food Insecurity (  12/25/2023)   Hunger Vital Sign    Worried About Running Out of Food in the Last Year: Never true    Ran Out of Food in the Last Year: Never true  Transportation Needs: No Transportation Needs (12/25/2023)   PRAPARE - Administrator, Civil Service (Medical): No    Lack of Transportation (Non-Medical): No  Physical Activity: Inactive (10/11/2019)   Received from [facility]   Exercise Vital Sign    On average, how many days per week do you engage in moderate to strenuous exercise (like a brisk walk)?: 0 days    On average, how many minutes do you engage in exercise at this level?: 0 min  Stress: Not on file  Social Connections: Not on file  Intimate Partner Violence: Not At Risk (12/25/2023)   Humiliation, Afraid, Rape, and Kick questionnaire    Fear of Current or Ex-Partner: No    Emotionally Abused: No    Physically Abused: No    Sexually Abused: No  :  REVIEW OF SYSTEMS:  A comprehensive 14 point review of systems was negative except as noted in the HPI.    Exam: BP 129/75 (BP Location: Left Arm, Patient Position: Sitting)   Pulse 68   Temp 97.8 F (36.6 C) (Oral)   Resp (!) 22   Ht 5' 3 (1.6 m)   Wt 207 lb 10.8 oz (94.2 kg)   SpO2 97%   BMI 36.79 kg/m    Physical Exam Vitals reviewed.  Constitutional:      General: She is not in acute distress. HENT:     Head: Normocephalic.  Eyes:     General: No scleral icterus.    Conjunctiva/sclera: Conjunctivae normal.  Cardiovascular:     Rate and Rhythm: Normal rate and regular rhythm.  Pulmonary:     Effort: Pulmonary effort is normal. No respiratory distress.     Breath sounds: Normal breath sounds.  Abdominal:     General: Bowel sounds are normal. There is no distension.     Palpations: Abdomen is soft.     Comments: Tenderness noted to palpation over the left upper quadrant.  I was unable to palpate  the liver and spleen due to body habitus.  Musculoskeletal:        General: Normal range of motion.     Right lower leg: No edema.     Left lower leg: No edema.  Lymphadenopathy:     Cervical: No cervical adenopathy.  Skin:    General: Skin is warm and dry.  Neurological:     Mental Status: She is alert and oriented to person, place, and time.  Psychiatric:        Mood and Affect: Mood normal.        Behavior: Behavior normal.        Thought Content: Thought content normal.        Judgment: Judgment normal.     LABS:  Lab Results  Component Value Date   WBC 3.7 (L) 06/08/2023   HGB 13.2 06/08/2023   HCT 38.9 06/08/2023   PLT 53 (L) 06/08/2023   GLUCOSE 115 (H) 06/08/2023   ALT 16 05/08/2023   AST 24 05/08/2023   NA 140 06/08/2023   K 3.8 06/08/2023   CL 107 06/08/2023   CREATININE 0.72 06/08/2023   BUN 11 06/08/2023   CO2 25 06/08/2023   INR 1.1 05/08/2023    US  ABDOMEN LIMITED RUQ (LIVER/GB) Result Date: 12/16/2023 CLINICAL DATA:  Cirrhosis.  HCC screening. EXAM: ULTRASOUND ABDOMEN LIMITED RIGHT UPPER QUADRANT COMPARISON:  05/18/2023 FINDINGS: Gallbladder: Gallstones: None Sludge: Present Gallbladder Wall: Within normal limits Pericholecystic fluid: None Sonographic Murphy's Sign: Negative per technologist Common bile duct: Diameter: 9 mm. Similar size seen on multiple prior ultrasounds, measuring up to 8.6 mm (08/15/2021). This indicates a benign etiology. Liver: Parenchymal echogenicity: Mildly coarsened Contours: Nodular Lesions: None Portal vein: Patent.  Hepatopetal flow Other: None. IMPRESSION: Cirrhotic liver morphology without focal hepatic lesion. Electronically Signed   By: Aliene Lloyd M.D.   On: 12/16/2023 16:15    ASSESSMENT AND PLAN:  1.  Thrombocytopenia. The patient has a longstanding history of thrombocytopenia.  Platelet count has been slowly declining over time.  More recently, her platelet count has been in the 50-70,000 range.  Causes for  thrombocytopenia were discussed with the patient today and includes pseudothrombocytopenia, underlying liver disease, drug-induced thrombocytopenia, nutritional deficiencies, ITP.  The patient is on several medications that can contribute to thrombocytopenia including the Abilify .  The patient has known liver disease and splenomegaly which I suspect are the causes of her thrombocytopenia.  However, we will perform additional workup today to rule out other causes.  Will perform a CBC, CMP, technologist review of peripheral blood smear, vitamin B12 level, folate level, methylmalonic acid level, copper level.  Will make further recommendations pending workup.  2.  Anemia. Patient reports anemia on recent lab work.  I do not have access to this lab result.  Given her report, will evaluate for causes of anemia including nutritional deficiencies as noted above but will also add on iron studies.  I will also check reticulocyte, haptoglobin, LDH to evaluate for hemolysis.  She asked if IV iron could be considered if she is found to be iron deficient.  We did discuss that this is a possibility depending on the severity of the iron deficiency.  I did have a brief discussion today with the patient about the potential adverse effects of IV iron including infusion reaction, nausea, arthralgias.  Will make further recommendations pending workup.  With regards to use of Eliquis , she may continue her Eliquis  as long as her platelet count is 50,000 or higher.  Will defer to cardiology about continued need for anticoagulation given that she has now normal sinus rhythm.  3.  Leukopenia. Noted to have mild leukopenia on recent lab work.  Will recheck CBC today.  Suspect this may be related to her underlying liver disease.  Monitor.  4.  Splenomegaly. This was noted on a prior MRCP.  Suspect this is contributing to her cytopenias.  Monitor.  5.  Liver cirrhosis/NASH/history of hepatitis C. The patient is followed by GI for  her liver cirrhosis.  Hepatitis C was successfully treated.  Continue to follow-up with GI.  6.  Portal hypertension. This secondary to her cirrhosis.  Continue to follow-up with GI.  7.  Atrial fibrillation. The patient has a history of A-fib status post ablation.  Now normal sinus rhythm.  Continue management per cardiology.  8.  Obesity. BMI today is 36.8.  Patient has expressed an interest in losing weight.  She was previously evaluated for bariatric surgery but is not a candidate due to her underlying cardiac and pulmonary disease.  She tells me that she has not been able to get GLP-1 medications approved.  We did discuss that Zepbound has FDA approval for obstructive sleep apnea which she does have.  She will discuss this further with her primary care provider.  Follow-up: In approximately 3 weeks to review lab results and make further recommendations.  Thank you for this referral.  Marigny Borre, DNP, AGPCNP-BC, AOCNP

## 2023-12-25 NOTE — Telephone Encounter (Signed)
 Called patient about lab results. PA stated patient needed to start taking Iron 325 mg daily with Orange juice to help absorb.   Patient aware and will start taking as directed.

## 2023-12-26 LAB — HAPTOGLOBIN: Haptoglobin: 83 mg/dL (ref 42–346)

## 2023-12-29 LAB — COPPER, SERUM: Copper: 146 ug/dL (ref 80–158)

## 2023-12-30 ENCOUNTER — Encounter (INDEPENDENT_AMBULATORY_CARE_PROVIDER_SITE_OTHER): Payer: Self-pay | Admitting: Gastroenterology

## 2023-12-31 DIAGNOSIS — J441 Chronic obstructive pulmonary disease with (acute) exacerbation: Secondary | ICD-10-CM | POA: Diagnosis not present

## 2023-12-31 DIAGNOSIS — H9201 Otalgia, right ear: Secondary | ICD-10-CM | POA: Diagnosis not present

## 2023-12-31 DIAGNOSIS — R062 Wheezing: Secondary | ICD-10-CM | POA: Diagnosis not present

## 2023-12-31 DIAGNOSIS — Z299 Encounter for prophylactic measures, unspecified: Secondary | ICD-10-CM | POA: Diagnosis not present

## 2024-01-03 LAB — METHYLMALONIC ACID, SERUM: Methylmalonic Acid, Quantitative: 117 nmol/L (ref 0–378)

## 2024-01-04 ENCOUNTER — Other Ambulatory Visit (HOSPITAL_COMMUNITY): Payer: Self-pay | Admitting: Psychiatry

## 2024-01-04 DIAGNOSIS — R109 Unspecified abdominal pain: Secondary | ICD-10-CM

## 2024-01-18 ENCOUNTER — Inpatient Hospital Stay: Admitting: Oncology

## 2024-01-21 ENCOUNTER — Inpatient Hospital Stay: Admitting: Oncology

## 2024-01-28 ENCOUNTER — Inpatient Hospital Stay: Attending: Oncology | Admitting: Oncology

## 2024-01-28 NOTE — Progress Notes (Deleted)
 [facility]  [phone] [phone]    INITIAL HEMATOLOGY CONSULTATION  Referring Provider: Almarie Crate, NP  Reason for Referral: Thrombocytopenia   HPI: Ms. Puccini is a 73 year old female with a past medical history significant for atrial fibrillation status post ablation currently on Eliquis , hypertension, COPD, depression, anxiety, hepatitis C, NASH, sleep apnea, splenomegaly who has been referred to hematology for evaluation of thrombocytopenia.  Her most recent CBC was completed on 06/08/2023 which showed WBC of 3.7, hemoglobin 13.2, MCV 95.8, platelet count 53,000.  She has been seen by hematology previously back in 2005/2006 for this issue.  However, records have not transferred over into epic and I cannot see the details of the notes.  Review of prior lab work shows the patient has had persistent thrombocytopenia dating back over 15 years.  Abdominal ultrasound obtained on 12/16/2023 showed cirrhotic liver morphology without focal hepatic lesion.  This was an ultrasound of the right upper quadrant so spleen was not visualized.  She did have an MRCP performed on 09/04/2021 which showed diffuse hepatic steatosis and marked splenomegaly with increased upper abdominal varicosities compatible with stigmata of portal venous hypertension.  Last EGD was performed on 09/16/2022 which showed grade 2 esophageal varices which were banded and portal hypertensive gastropathy.  The patient reports that she is experiencing fatigue.  She reports that her appetite is good and her weight has been stable.  She reports headaches which have been occurring behind her right eye and also has occasional right ear ringing.  She is not experiencing any fevers, chills, night sweats.  Denies chest pain.  She denies shortness of breath at rest but does have dyspnea with exertion.  She notices abdominal discomfort in her left upper quadrant.  She also reports that she has lower extremity  edema and is currently on a diuretic.  Her edema is improved when she elevates her legs.  She noticed a small amount of bleeding to her right ear yesterday but otherwise denies any other bleeding.  She does bruise easily.  She reports that a few years ago that she was evaluated for bariatric surgery but due to her cardiac disease, she was not a candidate.  The patient tells me that she was anemic on recent lab work.  However, I do not have a copy of this lab result.  She indicates that this lab was done about a week ago.   Past Medical History:  Diagnosis Date   Anxiety    Arthritis    Bronchitis    COPD (chronic obstructive pulmonary disease) (HCC)    Depression    Diverticulitis 04/2012   [facility]   Essential hypertension    Frequent PVCs    Outflow tract, positive in the inferior leads   GERD (gastroesophageal reflux disease)    Hepatitis C    Treated   History of pneumonia    Insomnia    Migraine headache    NASH (nonalcoholic steatohepatitis)    Sleep apnea    Splenomegaly    Thyroid  disease    Wears glasses   :  Past Surgical History:  Procedure Laterality Date   ABDOMINAL HYSTERECTOMY     ANKLE SURGERY Right    ATRIAL FIBRILLATION ABLATION N/A 06/08/2023   Procedure: ATRIAL FIBRILLATION ABLATION;  Surgeon: Nancey Eulas BRAVO, MD;  Location: [facility];  Service: Cardiovascular;  Laterality: N/A;   BIOPSY  10/05/2020   Procedure: BIOPSY;  Surgeon: Eartha Angelia Sieving, MD;  Location:  [facility];  Service: Gastroenterology;;   CARDIOVERSION N/A 02/06/2023   Procedure: CARDIOVERSION;  Surgeon: Stacia Diannah SQUIBB, MD;  Location: [facility] ORS;  Service: Cardiovascular;  Laterality: N/A;   COLON SURGERY     part of color removed   COLONOSCOPY     COLONOSCOPY WITH PROPOFOL  N/A 11/13/2020   Castaneda:single non bleeding colonic angiodysplastic lesion, diverticulosis in sigmoid, non bleeding external hemorrhoids, no specimens   ESOPHAGEAL BANDING   09/16/2022   Procedure: ESOPHAGEAL BANDING;  Surgeon: Eartha Angelia Sieving, MD;  Location: [facility];  Service: Gastroenterology;;   ESOPHAGEAL MANOMETRY N/A 04/10/2014   Procedure: ESOPHAGEAL MANOMETRY (EM);  Surgeon: Lupita FORBES Commander, MD;  Location: [facility];  Service: Endoscopy;  Laterality: N/A;   ESOPHAGOGASTRODUODENOSCOPY     ESOPHAGOGASTRODUODENOSCOPY (EGD) WITH PROPOFOL  N/A 10/05/2020   Castaneda: barrett's esophagus, short segment, 1cm hh, normal stomach, normal duodenum, biopsied, normal, no varices   ESOPHAGOGASTRODUODENOSCOPY (EGD) WITH PROPOFOL  N/A 09/05/2022   Procedure: ESOPHAGOGASTRODUODENOSCOPY (EGD) WITH PROPOFOL ;  Surgeon: Eartha Angelia Sieving, MD;  [facility];  Service: Gastroenterology;  Laterality: N/A;  2:00PM;ASA 3   ESOPHAGOGASTRODUODENOSCOPY (EGD) WITH PROPOFOL  N/A 09/16/2022   Procedure: ESOPHAGOGASTRODUODENOSCOPY (EGD) WITH PROPOFOL ;  Surgeon: Eartha Angelia Sieving, MD;  Location: [facility];  Service: Gastroenterology;  Laterality: N/A;  10:00am;asa 3   INCISIONAL HERNIA REPAIR N/A 10/24/2015   Procedure: LAPAROSCOPIC REPAIR INCISIONAL HERNIA WITH MESH;  Surgeon: Dann Hummer, MD;  Location: [facility] OR;  Service: General;  Laterality: N/A;   INSERTION OF MESH N/A 10/24/2015   Procedure: INSERTION OF MESH;  Surgeon: Dann Hummer, MD;  Location: [facility] OR;  Service: General;  Laterality: N/A;   JOINT REPLACEMENT     KNEE SURGERY Right    x4   PH IMPEDANCE STUDY N/A 04/10/2014   Procedure: PH IMPEDANCE STUDY;  Surgeon: Lupita FORBES Commander, MD;  Location: [facility];  Service: Endoscopy;  Laterality: N/A;   TOTAL SHOULDER ARTHROPLASTY Left 04/22/2018   Procedure: Left total shoulder arthroplasty;  Surgeon: Melita Drivers, MD;  Location: [facility];  Service: Orthopedics;  Laterality: Left;   :  CURRENT MEDS: Current Outpatient Medications  Medication Sig Dispense Refill   albuterol  (PROVENTIL  HFA;VENTOLIN  HFA) 108 (90 Base) MCG/ACT inhaler  Inhale 2 puffs into the lungs every 6 (six) hours as needed for wheezing or shortness of breath.     ALPRAZolam  (XANAX ) 1 MG tablet Take 1 tablet (1 mg total) by mouth 4 (four) times daily. 120 tablet 2   amitriptyline  (ELAVIL ) 10 MG tablet Take 1 tablet (10 mg total) by mouth at bedtime as needed for sleep. 30 tablet 2   apixaban  (ELIQUIS ) 5 MG TABS tablet TAKE ONE TABLET BY MOUTH TWICE DAILY 60 tablet 5   ARIPiprazole  (ABILIFY ) 2 MG tablet Take 1 tablet (2 mg total) by mouth daily. 30 tablet 2   ascorbic acid (VITAMIN C) 500 MG tablet Take 1,000 mg by mouth daily.     cephALEXin (KEFLEX) 500 MG capsule Take 500 mg by mouth 3 (three) times daily.     cyanocobalamin 1000 MCG tablet Take 1,000 mcg by mouth 2 (two) times daily.     ELDERBERRY PO Take 1,000 mg by mouth daily.     FLUoxetine  (PROZAC ) 40 MG capsule Take 1 capsule (40 mg total) by mouth daily. 30 capsule 3   Fluticasone -Umeclidin-Vilant 100-62.5-25 MCG/INH AEPB Inhale 1 puff into the lungs daily.     gabapentin  (NEURONTIN ) 300 MG capsule Take 300-600 mg by mouth See admin  instructions. Take 300 mg in the morning and 600 mg at bedtime     lansoprazole  (PREVACID ) 30 MG capsule Take 1 capsule (30 mg total) by mouth 2 (two) times daily before a meal. 60 capsule 1   linaclotide  (LINZESS ) 290 MCG CAPS capsule TAKE ONE CAPSULE BY MOUTH ONCE DAILY BEFORE BREAKFAST. (Patient taking differently: Take 290 mcg by mouth daily as needed (constipation).) 90 capsule 0   loratadine  (CLARITIN ) 10 MG tablet Take 1 tablet by mouth daily.     metoCLOPramide  (REGLAN ) 5 MG tablet Take 1 tablet (5 mg total) by mouth every 8 (eight) hours as needed for nausea (headache). 20 tablet 0   metoprolol  succinate (TOPROL -XL) 50 MG 24 hr tablet Take 1 tablet (50 mg total) by mouth daily. 30 tablet 6   Multiple Vitamin (MULTI-VITAMIN PO) Take 1 tablet by mouth daily.     nystatin (MYCOSTATIN) powder Apply 1 g topically daily as needed (rash).  2   polyethylene glycol  (MIRALAX ) 17 g packet Take 17 g by mouth in the morning.     Potassium 99 MG TABS Take 99 mg by mouth daily.     predniSONE (DELTASONE) 10 MG tablet Take 10 mg by mouth 2 (two) times daily.     rosuvastatin  (CRESTOR ) 10 MG tablet Take 1 tablet (10 mg total) by mouth daily. 90 tablet 1   Simethicone  (GAS RELIEF PO) Take 2 capsules by mouth as needed (flatulence).      temazepam  (RESTORIL ) 30 MG capsule Take 1 capsule (30 mg total) by mouth at bedtime. 30 capsule 2   traMADol  (ULTRAM ) 50 MG tablet Take 50 mg by mouth 3 (three) times daily as needed for severe pain.     No current facility-administered medications for this visit.    Allergies  Allergen Reactions   Acetaminophen  Other (See Comments)    Liver function per MD    Levothyroxine Hives and Itching   Nabumetone Rash  :  Family History  Problem Relation Age of Onset   Breast cancer Mother    Heart failure Mother    Alcohol abuse Father    Dementia Father    Depression Brother    Drug abuse Brother    Other Brother    Skin cancer Brother    Alcohol abuse Paternal Uncle    Depression Paternal Uncle    Dementia Maternal Grandmother    Bipolar disorder Daughter    Anxiety disorder Daughter    ADD / ADHD Neg Hx   :  Social History   Socioeconomic History   Marital status: Widowed    Spouse name: Not on file   Number of children: 2   Years of education: 12th grade   Highest education level: Not on file  Occupational History   Occupation: RETIRED  Tobacco Use   Smoking status: Never    Passive exposure: Current   Smokeless tobacco: Never   Tobacco comments:    Never smoked 07/06/23  Vaping Use   Vaping status: Never Used  Substance and Sexual Activity   Alcohol use: Yes    Alcohol/week: 3.0 standard drinks of alcohol    Types: 3 Cans of beer per week    Comment: occasionally   Drug use: No   Sexual activity: Never  Other Topics Concern   Not on file  Social History Narrative   Not on file   Social  Drivers of Health   Financial Resource Strain: Not on file  Food Insecurity: No Food Insecurity (  12/25/2023)   Hunger Vital Sign    Worried About Running Out of Food in the Last Year: Never true    Ran Out of Food in the Last Year: Never true  Transportation Needs: No Transportation Needs (12/25/2023)   PRAPARE - Administrator, Civil Service (Medical): No    Lack of Transportation (Non-Medical): No  Physical Activity: Inactive (10/11/2019)   Received [facility]   Exercise Vital Sign    On average, how many days per week do you engage in moderate to strenuous exercise (like a brisk walk)?: 0 days    On average, how many minutes do you engage in exercise at this level?: 0 min  Stress: Not on file  Social Connections: Not on file  Intimate Partner Violence: Not At Risk (12/25/2023)   Humiliation, Afraid, Rape, and Kick questionnaire    Fear of Current or Ex-Partner: No    Emotionally Abused: No    Physically Abused: No    Sexually Abused: No  :  REVIEW OF SYSTEMS:  A comprehensive 14 point review of systems was negative except as noted in the HPI.    Exam: There were no vitals taken for this visit.   Physical Exam Vitals reviewed.  Constitutional:      General: She is not in acute distress. HENT:     Head: Normocephalic.  Eyes:     General: No scleral icterus.    Conjunctiva/sclera: Conjunctivae normal.  Cardiovascular:     Rate and Rhythm: Normal rate and regular rhythm.  Pulmonary:     Effort: Pulmonary effort is normal. No respiratory distress.     Breath sounds: Normal breath sounds.  Abdominal:     General: Bowel sounds are normal. There is no distension.     Palpations: Abdomen is soft.     Comments: Tenderness noted to palpation over the left upper quadrant.  I was unable to palpate the liver and spleen due to body habitus.  Musculoskeletal:        General: Normal range of motion.     Right lower leg: No edema.     Left lower leg: No  edema.  Lymphadenopathy:     Cervical: No cervical adenopathy.  Skin:    General: Skin is warm and dry.  Neurological:     Mental Status: She is alert and oriented to person, place, and time.  Psychiatric:        Mood and Affect: Mood normal.        Behavior: Behavior normal.        Thought Content: Thought content normal.        Judgment: Judgment normal.     LABS:  Lab Results  Component Value Date   WBC 3.2 (L) 12/25/2023   HGB 11.4 (L) 12/25/2023   HCT 34.7 (L) 12/25/2023   PLT 51 (L) 12/25/2023   GLUCOSE 116 (H) 12/25/2023   ALT 18 12/25/2023   AST 35 12/25/2023   NA 144 12/25/2023   K 3.8 12/25/2023   CL 106 12/25/2023   CREATININE 0.73 12/25/2023   BUN 8 12/25/2023   CO2 27 12/25/2023   INR 1.1 05/08/2023    No results found.   ASSESSMENT AND PLAN:  1.  Thrombocytopenia. The patient has a longstanding history of thrombocytopenia.  Platelet count has been slowly declining over time.  More recently, her platelet count has been in the 50-70,000 range.  Causes for thrombocytopenia were discussed with the patient today and  includes pseudothrombocytopenia, underlying liver disease, drug-induced thrombocytopenia, nutritional deficiencies, ITP.  The patient is on several medications that can contribute to thrombocytopenia including the Abilify .  The patient has known liver disease and splenomegaly which I suspect are the causes of her thrombocytopenia.  However, we will perform additional workup today to rule out other causes.  Will perform a CBC, CMP, technologist review of peripheral blood smear, vitamin B12 level, folate level, methylmalonic acid level, copper level.  Will make further recommendations pending workup.  2.  Anemia. Patient reports anemia on recent lab work.  I do not have access to this lab result.  Given her report, will evaluate for causes of anemia including nutritional deficiencies as noted above but will also add on iron studies.  I will also check  reticulocyte, haptoglobin, LDH to evaluate for hemolysis.  She asked if IV iron could be considered if she is found to be iron deficient.  We did discuss that this is a possibility depending on the severity of the iron deficiency.  I did have a brief discussion today with the patient about the potential adverse effects of IV iron including infusion reaction, nausea, arthralgias.  Will make further recommendations pending workup.  With regards to use of Eliquis , she may continue her Eliquis  as long as her platelet count is 50,000 or higher.  Will defer to cardiology about continued need for anticoagulation given that she has now normal sinus rhythm.  3.  Leukopenia. Noted to have mild leukopenia on recent lab work.  Will recheck CBC today.  Suspect this may be related to her underlying liver disease.  Monitor.  4.  Splenomegaly. This was noted on a prior MRCP.  Suspect this is contributing to her cytopenias.  Monitor.  5.  Liver cirrhosis/NASH/history of hepatitis C. The patient is followed by GI for her liver cirrhosis.  Hepatitis C was successfully treated.  Continue to follow-up with GI.  6.  Portal hypertension. This secondary to her cirrhosis.  Continue to follow-up with GI.  7.  Atrial fibrillation. The patient has a history of A-fib status post ablation.  Now normal sinus rhythm.  Continue management per cardiology.  8.  Obesity. BMI today is 36.8.  Patient has expressed an interest in losing weight.  She was previously evaluated for bariatric surgery but is not a candidate due to her underlying cardiac and pulmonary disease.  She tells me that she has not been able to get GLP-1 medications approved.  We did discuss that Zepbound has FDA approval for obstructive sleep apnea which she does have.  She will discuss this further with her primary care provider.   Follow-up: In approximately 3 weeks to review lab results and make further recommendations.  Thank you for this  referral.  Kristin Curcio, DNP, AGPCNP-BC, AOCNP

## 2024-02-10 ENCOUNTER — Other Ambulatory Visit (HOSPITAL_COMMUNITY): Payer: Self-pay | Admitting: Psychiatry

## 2024-02-10 DIAGNOSIS — R109 Unspecified abdominal pain: Secondary | ICD-10-CM

## 2024-03-02 ENCOUNTER — Inpatient Hospital Stay: Attending: Oncology | Admitting: Oncology

## 2024-03-11 ENCOUNTER — Encounter (HOSPITAL_COMMUNITY): Payer: Self-pay | Admitting: Psychiatry

## 2024-03-11 ENCOUNTER — Other Ambulatory Visit (HOSPITAL_COMMUNITY): Payer: Self-pay | Admitting: Psychiatry

## 2024-03-11 ENCOUNTER — Telehealth (HOSPITAL_COMMUNITY): Admitting: Psychiatry

## 2024-03-11 DIAGNOSIS — F331 Major depressive disorder, recurrent, moderate: Secondary | ICD-10-CM | POA: Diagnosis not present

## 2024-03-11 DIAGNOSIS — R109 Unspecified abdominal pain: Secondary | ICD-10-CM | POA: Diagnosis not present

## 2024-03-11 MED ORDER — AMITRIPTYLINE HCL 10 MG PO TABS: 10.0000 mg | ORAL_TABLET | Freq: Every evening | ORAL | 2 refills | Status: DC | PRN

## 2024-03-11 MED ORDER — TEMAZEPAM 30 MG PO CAPS: 30.0000 mg | ORAL_CAPSULE | Freq: Every day | ORAL | 2 refills | Status: DC

## 2024-03-11 MED ORDER — FLUOXETINE HCL 40 MG PO CAPS: 40.0000 mg | ORAL_CAPSULE | Freq: Every day | ORAL | 3 refills | Status: DC

## 2024-03-11 MED ORDER — ALPRAZOLAM 1 MG PO TABS: 1.0000 mg | ORAL_TABLET | Freq: Four times a day (QID) | ORAL | 2 refills | Status: DC

## 2024-03-11 MED ORDER — ARIPIPRAZOLE 2 MG PO TABS: 2.0000 mg | ORAL_TABLET | Freq: Every day | ORAL | 2 refills | Status: DC

## 2024-03-11 NOTE — Progress Notes (Signed)
 Virtual Visit via Telephone Note  I connected with [name] on 03/11/2024 at  9:40 AM EST by telephone and verified that I am speaking with the correct person using two identifiers.  Location: Patient: home Provider: office   I discussed the limitations, risks, security and privacy concerns of performing an evaluation and management service by telephone and the availability of in person appointments. I also discussed with the patient that there may be a patient responsible charge related to this service. The patient expressed understanding and agreed to proceed.      I discussed the assessment and treatment plan with the patient. The patient was provided an opportunity to ask questions and all were answered. The patient agreed with the plan and demonstrated an understanding of the instructions.   The patient was advised to call back or seek an in-person evaluation if the symptoms worsen or if the condition fails to improve as anticipated.  I provided 20 minutes of non-face-to-face time during this encounter.   Barnie Gull, MD  [facility] MD/PA/NP OP Progress Note  03/11/2024 9:52 AM [name]  MRN:  [id number]  Chief Complaint:  Chief Complaint  Patient presents with   Anxiety   Depression   Follow-up   HPI: This patient is a 73 year old widowed white female who lives with her daughter and a friend in [place].  She is on disability.  The patient returns for follow-up after 3 months regarding her moderate depression and generalized anxiety disorder.  Overall she is doing okay.  She is still followed by hematology/oncology because of low platelets and low WBCs.  She is on prednisone to keep her platelets.  She states in general her mood has been stable and she denies significant depression or anxiety with the current medications.  She is taking care of her friend who has metastatic liver cancer.  Her sleep is okay but she tries to sleep when the friend sleeps. Visit Diagnosis:     ICD-10-CM   1. Major depressive disorder, recurrent episode, moderate (HCC)  F33.1     2. Functional abdominal pain syndrome  R10.9 amitriptyline  (ELAVIL ) 10 MG tablet      Past Psychiatric History: none  Past Medical History:  Past Medical History:  Diagnosis Date   Anxiety    Arthritis    Bronchitis    COPD (chronic obstructive pulmonary disease) (HCC)    Depression    Diverticulitis 04/2012   [facility]   Essential hypertension    Frequent PVCs    Outflow tract, positive in the inferior leads   GERD (gastroesophageal reflux disease)    Hepatitis C    Treated   History of pneumonia    Insomnia    Migraine headache    NASH (nonalcoholic steatohepatitis)    Sleep apnea    Splenomegaly    Thyroid  disease    Wears glasses     Past Surgical History:  Procedure Laterality Date   ABDOMINAL HYSTERECTOMY     ANKLE SURGERY Right    ATRIAL FIBRILLATION ABLATION N/A 06/08/2023   Procedure: ATRIAL FIBRILLATION ABLATION;  Surgeon: Nancey Eulas BRAVO, MD;  [facility];  Service: Cardiovascular;  Laterality: N/A;   BIOPSY  10/05/2020   Procedure: BIOPSY;  Surgeon: Eartha Angelia Sieving, MD;  Location: [facility];  Service: Gastroenterology;;   CARDIOVERSION N/A 02/06/2023   Procedure: CARDIOVERSION;  Surgeon: Stacia Diannah SQUIBB, MD;  Location: [facility] ORS;  Service: Cardiovascular;  Laterality: N/A;   COLON  SURGERY     part of color removed   COLONOSCOPY     COLONOSCOPY WITH PROPOFOL  N/A 11/13/2020   Castaneda:single non bleeding colonic angiodysplastic lesion, diverticulosis in sigmoid, non bleeding external hemorrhoids, no specimens   ESOPHAGEAL BANDING  09/16/2022   Procedure: ESOPHAGEAL BANDING;  Surgeon: Eartha Angelia Sieving, MD;  Location: [facility];  Service: Gastroenterology;;   ESOPHAGEAL MANOMETRY N/A 04/10/2014   Procedure: ESOPHAGEAL MANOMETRY (EM);  Surgeon: Lupita FORBES Commander, MD;  Location: [facility] ENDOS[facility];  Service: Endoscopy;   Laterality: N/A;   ESOPHAGOGASTRODUODENOSCOPY     ESOPHAGOGASTRODUODENOSCOPY (EGD) WITH PROPOFOL  N/A 10/05/2020   Castaneda: barrett's esophagus, short segment, 1cm hh, normal stomach, normal duodenum, biopsied, normal, no varices   ESOPHAGOGASTRODUODENOSCOPY (EGD) WITH PROPOFOL  N/A 09/05/2022   Procedure: ESOPHAGOGASTRODUODENOSCOPY (EGD) WITH PROPOFOL ;  Surgeon: Eartha Angelia Sieving, MD;  [facility];  Service: Gastroenterology;  Laterality: N/A;  2:00PM;ASA 3   ESOPHAGOGASTRODUODENOSCOPY (EGD) WITH PROPOFOL  N/A 09/16/2022   Procedure: ESOPHAGOGASTRODUODENOSCOPY (EGD) WITH PROPOFOL ;  Surgeon: Eartha Angelia Sieving, MD;  [facility];  Service: Gastroenterology;  Laterality: N/A;  10:00am;asa 3   INCISIONAL HERNIA REPAIR N/A 10/24/2015   Procedure: LAPAROSCOPIC REPAIR INCISIONAL HERNIA WITH MESH;  Surgeon: Dann Hummer, MD;  Location: [facility] OR;  Service: General;  Laterality: N/A;   INSERTION OF MESH N/A 10/24/2015   Procedure: INSERTION OF MESH;  Surgeon: Dann Hummer, MD;  Location: [facility] OR;  Service: General;  Laterality: N/A;   JOINT REPLACEMENT     KNEE SURGERY Right    x4   PH IMPEDANCE STUDY N/A 04/10/2014   Procedure: PH IMPEDANCE STUDY;  Surgeon: Lupita FORBES Commander, MD;  Location: [facility];  Service: Endoscopy;  Laterality: N/A;   TOTAL SHOULDER ARTHROPLASTY Left 04/22/2018   Procedure: Left total shoulder arthroplasty;  Surgeon: Melita Drivers, MD;  Location: [facility] ORS;  Service: Orthopedics;  Laterality: Left;     Family Psychiatric History: See below  Family History:  Family History  Problem Relation Age of Onset   Breast cancer Mother    Heart failure Mother    Alcohol abuse Father    Dementia Father    Depression Brother    Drug abuse Brother    Other Brother    Skin cancer Brother    Alcohol abuse Paternal Uncle    Depression Paternal Uncle    Dementia Maternal Grandmother    Bipolar disorder Daughter    Anxiety disorder Daughter     ADD / ADHD Neg Hx     Social History:  Social History   Socioeconomic History   Marital status: Widowed    Spouse name: Not on file   Number of children: 2   Years of education: 12th grade   Highest education level: Not on file  Occupational History   Occupation: RETIRED  Tobacco Use   Smoking status: Never    Passive exposure: Current   Smokeless tobacco: Never   Tobacco comments:    Never smoked 07/06/23  Vaping Use   Vaping status: Never Used  Substance and Sexual Activity   Alcohol use: Yes    Alcohol/week: 3.0 standard drinks of alcohol    Types: 3 Cans of beer per week    Comment: occasionally   Drug use: No   Sexual activity: Never  Other Topics Concern   Not on file  Social History Narrative   Not on file   Social Drivers of Health   Tobacco Use: Medium Risk (03/11/2024)   Patient  History    Smoking Tobacco Use: Never    Smokeless Tobacco Use: Never    Passive Exposure: Current  Financial Resource Strain: Not on file  Food Insecurity: No Food Insecurity (12/25/2023)   Epic    Worried About Programme Researcher, Broadcasting/film/video in the Last Year: Never true    Ran Out of Food in the Last Year: Never true  Transportation Needs: No Transportation Needs (12/25/2023)   Epic    Lack of Transportation (Medical): No    Lack of Transportation (Non-Medical): No  Physical Activity: Not on file  Stress: Not on file  Social Connections: Not on file  Depression (EYV7-0): Low Risk (12/25/2023)   Depression (PHQ2-9)    PHQ-2 Score: 1  Alcohol Screen: Not on file  Housing: Low Risk (12/25/2023)   Epic    Unable to Pay for Housing in the Last Year: No    Number of Times Moved in the Last Year: 0    Homeless in the Last Year: No  Utilities: Not At Risk (12/25/2023)   Epic    Threatened with loss of utilities: No  Health Literacy: Not on file    Allergies: Allergies[1]  Metabolic Disorder Labs: No results found for: HGBA1C, MPG No results found for: PROLACTIN No  results found for: CHOL, TRIG, HDL, CHOLHDL, VLDL, LDLCALC Lab Results  Component Value Date   TSH 3.26 10/17/2019   TSH 4.540 (H) 10/28/2007    Therapeutic Level Labs: No results found for: LITHIUM No results found for: VALPROATE No results found for: CBMZ  Current Medications: Current Outpatient Medications  Medication Sig Dispense Refill   albuterol  (PROVENTIL  HFA;VENTOLIN  HFA) 108 (90 Base) MCG/ACT inhaler Inhale 2 puffs into the lungs every 6 (six) hours as needed for wheezing or shortness of breath.     ALPRAZolam  (XANAX ) 1 MG tablet Take 1 tablet (1 mg total) by mouth 4 (four) times daily. 120 tablet 2   amitriptyline  (ELAVIL ) 10 MG tablet Take 1 tablet (10 mg total) by mouth at bedtime as needed. for sleep 30 tablet 2   apixaban  (ELIQUIS ) 5 MG TABS tablet TAKE ONE TABLET BY MOUTH TWICE DAILY 60 tablet 5   ARIPiprazole  (ABILIFY ) 2 MG tablet Take 1 tablet (2 mg total) by mouth daily. 30 tablet 2   ascorbic acid (VITAMIN C) 500 MG tablet Take 1,000 mg by mouth daily.     cephALEXin (KEFLEX) 500 MG capsule Take 500 mg by mouth 3 (three) times daily.     cyanocobalamin  1000 MCG tablet Take 1,000 mcg by mouth 2 (two) times daily.     ELDERBERRY PO Take 1,000 mg by mouth daily.     FLUoxetine  (PROZAC ) 40 MG capsule Take 1 capsule (40 mg total) by mouth daily. 30 capsule 3   Fluticasone -Umeclidin-Vilant 100-62.5-25 MCG/INH AEPB Inhale 1 puff into the lungs daily.     gabapentin  (NEURONTIN ) 300 MG capsule Take 300-600 mg by mouth See admin instructions. Take 300 mg in the morning and 600 mg at bedtime     lansoprazole  (PREVACID ) 30 MG capsule Take 1 capsule (30 mg total) by mouth 2 (two) times daily before a meal. 60 capsule 1   linaclotide  (LINZESS ) 290 MCG CAPS capsule TAKE ONE CAPSULE BY MOUTH ONCE DAILY BEFORE BREAKFAST. (Patient taking differently: Take 290 mcg by mouth daily as needed (constipation).) 90 capsule 0   loratadine  (CLARITIN ) 10 MG tablet Take 1 tablet  by mouth daily.     metoCLOPramide  (REGLAN ) 5 MG tablet Take 1 tablet (  5 mg total) by mouth every 8 (eight) hours as needed for nausea (headache). 20 tablet 0   metoprolol  succinate (TOPROL -XL) 50 MG 24 hr tablet Take 1 tablet (50 mg total) by mouth daily. 30 tablet 6   Multiple Vitamin (MULTI-VITAMIN PO) Take 1 tablet by mouth daily.     nystatin (MYCOSTATIN) powder Apply 1 g topically daily as needed (rash).  2   polyethylene glycol (MIRALAX ) 17 g packet Take 17 g by mouth in the morning.     Potassium 99 MG TABS Take 99 mg by mouth daily.     predniSONE (DELTASONE) 10 MG tablet Take 10 mg by mouth 2 (two) times daily.     rosuvastatin  (CRESTOR ) 10 MG tablet Take 1 tablet (10 mg total) by mouth daily. 90 tablet 1   Simethicone  (GAS RELIEF PO) Take 2 capsules by mouth as needed (flatulence).      temazepam  (RESTORIL ) 30 MG capsule Take 1 capsule (30 mg total) by mouth at bedtime. 30 capsule 2   traMADol  (ULTRAM ) 50 MG tablet Take 50 mg by mouth 3 (three) times daily as needed for severe pain.     No current facility-administered medications for this visit.     Musculoskeletal: Strength & Muscle Tone: na Gait & Station: na Patient leans: N/A  Psychiatric Specialty Exam: Review of Systems  Constitutional:  Positive for fatigue.  All other systems reviewed and are negative.   There were no vitals taken for this visit.There is no height or weight on file to calculate BMI.  General Appearance: NA  Eye Contact:  NA  Speech:  Clear and Coherent  Volume:  Normal  Mood:  Euthymic  Affect:  NA  Thought Process:  Goal Directed  Orientation:  Full (Time, Place, and Person)  Thought Content: WDL   Suicidal Thoughts:  No  Homicidal Thoughts:  No  Memory:  Immediate;   Good Recent;   Good Remote;   NA  Judgement:  Good  Insight:  Good  Psychomotor Activity:  Decreased  Concentration:  Concentration: Good and Attention Span: Good  Recall:  Good  Fund of Knowledge: Good  Language:  Good  Akathisia:  No  Handed:  Right  AIMS (if indicated): not done  Assets:  Communication Skills Desire for Improvement Resilience Social Support  ADL's:  Intact  Cognition: WNL  Sleep:  Good   Screenings: PHQ2-9    Flowsheet Row Office Visit from 12/25/2023 in [facility] [place] Video Visit from 11/14/2021 in [facility] at [place] Video Visit from 07/29/2021 in [facility] at [place] Video Visit from 05/07/2021 in [facility] at [place] Video Visit from 12/31/2020 in [facility] at [facility] Total Score 1 0 1 1 0   Flowsheet Row Admission (Discharged) from 06/08/2023 in [facility] CARDIAC C[facility] ED from 03/16/2023 in [facility] at [facility] Admission (Discharged) from 02/06/2023 in [place] [facility]  C-SSRS RISK CATEGORY No Risk No Risk No Risk     Assessment and Plan: This patient is a 73 year old female with a history of major depression and generalized anxiety disorder.  Overall she is doing well on her current regimen.  She will continue Prozac  40 mg daily for depression, Abilify  2 mg daily for antidepressant augmentation, Xanax  1 mg 4 times daily for anxiety, temazepam  30 mg at  bedtime for sleep and amitriptyline  10 mg at bedtime as needed for sleep or functional abdominal pain.  She will return to see me in 3 months  Collaboration of Care: Collaboration of Care: Other provider involved in patient's care AEB notes are shared with all specialist in the epic system  Patient/Guardian was advised Release of Information must be obtained prior to any record release in order to collaborate their care with an outside provider. Patient/Guardian was advised if they have not already done so to contact the registration department to sign all necessary  forms in order for us  to release information regarding their care.   Consent: Patient/Guardian gives verbal consent for treatment and assignment of benefits for services provided during this visit. Patient/Guardian expressed understanding and agreed to proceed.    Barnie Gull, MD 03/11/2024, 9:52 AM     [1]  Allergies Allergen Reactions   Acetaminophen  Other (See Comments)    Liver function per MD    Levothyroxine Hives and Itching   Nabumetone Rash

## 2024-03-14 ENCOUNTER — Encounter: Payer: Self-pay | Admitting: *Deleted

## 2024-03-22 ENCOUNTER — Inpatient Hospital Stay: Attending: Oncology | Admitting: Oncology

## 2024-03-22 ENCOUNTER — Encounter: Payer: Self-pay | Admitting: *Deleted

## 2024-03-22 DIAGNOSIS — D72819 Decreased white blood cell count, unspecified: Secondary | ICD-10-CM | POA: Insufficient documentation

## 2024-03-22 NOTE — Progress Notes (Unsigned)
 "  [facility] OFFICE PROGRESS NOTE  Rosamond Leta NOVAK, MD  ASSESSMENT & PLAN:    Assessment & Plan Thrombocytopenia The patient has a longstanding history of thrombocytopenia.   Platelet count has been slowly declining over time.   More recently, her platelet count has been in the 50-70,000 range.   Causes for thrombocytopenia were discussed with the patient today and includes pseudothrombocytopenia, underlying liver disease, drug-induced thrombocytopenia, nutritional deficiencies, ITP.   The patient is on several medications that can contribute to thrombocytopenia including the Abilify .   The patient has known liver disease and splenomegaly which I suspect are the causes of her thrombocytopenia.  Additional workup performed to rule out nutritional deficiencies which ultimately showed low ferritin, elevated B12 levels but otherwise unremarkable.  Peripheral smear showed normal blood cell morphology. CBC showed hemoglobin 11.4, white blood cell count 3.2, platelets 51,000 with a normal differential. Given she is now anemic, would recommend starting iron tablets 324/325 mg ferrous sulfate/gluconate depending on tolerance every other day with vitamin C to promote absorption.  If she is unable to tolerate, would recommend IV iron. Anemia, unspecified type CBC showed hemoglobin 11.4, white blood cell count 3.2, platelets 51,000 with a normal differential. Given she is now anemic, would recommend starting iron tablets 324/325 mg ferrous sulfate/gluconate depending on tolerance every other day with vitamin C to promote absorption.  If she is unable to tolerate, would recommend IV iron. No evidence of hemolysis with normal immature reticulocyte fraction and reticulocyte count absolute.  LDH and haptoglobin WNL. With regards to use of Eliquis , she may continue her Eliquis  as long as her platelet count is 50,000 or higher.  Will defer to cardiology about continued need for anticoagulation  given that she has now normal sinus rhythm. Other neutropenia Noted to have mild leukopenia on recent lab work.   Suspect this may be related to her underlying liver disease.  Monitor.   Splenomegaly This was noted on a prior MRCP. Suspect this is contributing to her cytopenias. Monitor.  Cirrhosis of liver without ascites, unspecified hepatic cirrhosis type [facility]) The patient is followed by GI for her liver cirrhosis. Hepatitis C was successfully treated. Continue to follow-up with GI Recent ultrasound of liver on 12/16/2023 showed cirrhotic liver morphology without focal hepatic lesion.  No orders of the defined types were placed in this encounter.   INTERVAL HISTORY: Ms. Hollon is a 74 year old female with a past medical history significant for atrial fibrillation status post ablation currently on Eliquis , hypertension, COPD, depression, anxiety, hepatitis C, NASH, sleep apnea, splenomegaly who has been referred to hematology for evaluation of thrombocytopenia.  Her most recent CBC was completed on 06/08/2023 which showed WBC of 3.7, hemoglobin 13.2, MCV 95.8, platelet count 53,000.  She has been seen by hematology previously back in 2005/2006 for this issue.  However, records have not transferred over into epic and I cannot see the details of the notes.  Review of prior lab work shows the patient has had persistent thrombocytopenia dating back over 15 years.  Abdominal ultrasound obtained on 12/16/2023 showed cirrhotic liver morphology without focal hepatic lesion.  This was an ultrasound of the right upper quadrant so spleen was not visualized.  She did have an MRCP performed on 09/04/2021 which showed diffuse hepatic steatosis and marked splenomegaly with increased upper abdominal varicosities compatible with stigmata of portal venous hypertension.  Last EGD was performed on 09/16/2022 which showed grade 2 esophageal varices which were banded and portal hypertensive gastropathy.  The patient  reports that she is experiencing fatigue.  She reports that her appetite is good and her weight has been stable.  She reports headaches which have been occurring behind her right eye and also has occasional right ear ringing.  She is not experiencing any fevers, chills, night sweats.  Denies chest pain.  She denies shortness of breath at rest but does have dyspnea with exertion.  She notices abdominal discomfort in her left upper quadrant.  She also reports that she has lower extremity edema and is currently on a diuretic.  Her edema is improved when she elevates her legs.  She noticed a small amount of bleeding to her right ear yesterday but otherwise denies any other bleeding.  She does bruise easily.  She reports that a few years ago that she was evaluated for bariatric surgery but due to her cardiac disease, she was not a candidate.   The patient tells me that she was anemic on recent lab work.  However, I do not have a copy of this lab result.  She indicates that this lab was done about a week ago.  We reviewed labs from 12/25/2023.  SUMMARY OF HEMATOLOGIC HISTORY: Oncology History   No problem history exists.     CBC    Component Value Date/Time   WBC 3.2 (L) 12/25/2023 0949   RBC 3.57 (L) 12/25/2023 0950   RBC 3.62 (L) 12/25/2023 0949   HGB 11.4 (L) 12/25/2023 0949   HGB 12.5 05/08/2023 1036   HCT 34.7 (L) 12/25/2023 0949   HCT 36.7 05/08/2023 1036   PLT 51 (L) 12/25/2023 0949   PLT 52 (LL) 05/08/2023 1036   MCV 95.9 12/25/2023 0949   MCV 96 05/08/2023 1036   MCH 31.5 12/25/2023 0949   MCHC 32.9 12/25/2023 0949   RDW 17.3 (H) 12/25/2023 0949   RDW 15.7 (H) 05/08/2023 1036   LYMPHSABS 0.7 12/25/2023 0949   MONOABS 0.3 12/25/2023 0949   EOSABS 0.1 12/25/2023 0949   BASOSABS 0.0 12/25/2023 0949       Latest Ref Rng & Units 12/25/2023    9:49 AM 06/08/2023   10:11 AM 05/08/2023   10:36 AM  CMP  Glucose 70 - 99 mg/dL 883  884    BUN 8 - 23 mg/dL 8  11    Creatinine 9.55 -  1.00 mg/dL 9.26  9.27    Sodium 864 - 145 mmol/L 144  140    Potassium 3.5 - 5.1 mmol/L 3.8  3.8    Chloride 98 - 111 mmol/L 106  107    CO2 22 - 32 mmol/L 27  25    Calcium  8.9 - 10.3 mg/dL 9.4  9.7    Total Protein 6.5 - 8.1 g/dL 6.7   6.3   Total Bilirubin 0.0 - 1.2 mg/dL 0.8   0.5   Alkaline Phos 38 - 126 U/L 124   126   AST 15 - 41 U/L 35   24   ALT 0 - 44 U/L 18   16      Lab Results  Component Value Date   FERRITIN 31 12/25/2023   VITAMINB12 1,258 (H) 12/25/2023    There were no vitals filed for this visit.  Review of System:  ROS  Physical Exam: Physical Exam Constitutional:      Appearance: Normal appearance.  HENT:     Head: Normocephalic and atraumatic.  Eyes:     Pupils: Pupils are equal, round, and reactive to light.  Cardiovascular:     Rate and Rhythm: Normal rate and regular rhythm.     Heart sounds: Normal heart sounds. No murmur heard. Pulmonary:     Effort: Pulmonary effort is normal.     Breath sounds: Normal breath sounds. No wheezing.  Abdominal:     General: Bowel sounds are normal. There is no distension.     Palpations: Abdomen is soft.     Tenderness: There is no abdominal tenderness.  Musculoskeletal:        General: Normal range of motion.     Cervical back: Normal range of motion.  Skin:    General: Skin is warm and dry.     Findings: No rash.  Neurological:     Mental Status: She is alert and oriented to person, place, and time.     Gait: Gait is intact.  Psychiatric:        Mood and Affect: Mood and affect normal.        Cognition and Memory: Memory normal.        Judgment: Judgment normal.      I spent *** minutes dedicated to the care of this patient (face-to-face and non-face-to-face) on the date of the encounter to include what is described in the assessment and plan.,  Delon Hope, NP 03/22/2024 11:24 AM "

## 2024-03-22 NOTE — Assessment & Plan Note (Addendum)
 The patient is followed by GI for her liver cirrhosis. Hepatitis C was successfully treated. Continue to follow-up with GI Recent ultrasound of liver on 12/16/2023 showed cirrhotic liver morphology without focal hepatic lesion.

## 2024-03-22 NOTE — Assessment & Plan Note (Addendum)
 Noted to have mild leukopenia on recent lab work.   Suspect this may be related to her underlying liver disease.  Monitor.

## 2024-03-22 NOTE — Progress Notes (Signed)
 Patient no showed for her 3rd appt today.  Provider does not wish to reschedule and wants patient discharged from clinic.  Patient letter sent due to multiple missed appts.  Labs faxed to her PCP and notified that we will no longer be following here at this office.

## 2024-03-22 NOTE — Progress Notes (Signed)
 Per office practice guidelines, patient has been discharged from practice due to excessive no show visits.  Letter was sent to patient as well as her PCP Dr. Rosamond.

## 2024-03-22 NOTE — Assessment & Plan Note (Addendum)
 This was noted on a prior MRCP. Suspect this is contributing to her cytopenias. Monitor.

## 2024-03-22 NOTE — Assessment & Plan Note (Addendum)
 CBC showed hemoglobin 11.4, white blood cell count 3.2, platelets 51,000 with a normal differential. Given she is now anemic, would recommend starting iron tablets 324/325 mg ferrous sulfate/gluconate depending on tolerance every other day with vitamin C to promote absorption.  If she is unable to tolerate, would recommend IV iron. No evidence of hemolysis with normal immature reticulocyte fraction and reticulocyte count absolute.  LDH and haptoglobin WNL. With regards to use of Eliquis , she may continue her Eliquis  as long as her platelet count is 50,000 or higher.  Will defer to cardiology about continued need for anticoagulation given that she has now normal sinus rhythm.

## 2024-03-22 NOTE — Assessment & Plan Note (Addendum)
 The patient has a longstanding history of thrombocytopenia.   Platelet count has been slowly declining over time.   More recently, her platelet count has been in the 50-70,000 range.   Causes for thrombocytopenia were discussed with the patient today and includes pseudothrombocytopenia, underlying liver disease, drug-induced thrombocytopenia, nutritional deficiencies, ITP.   The patient is on several medications that can contribute to thrombocytopenia including the Abilify .   The patient has known liver disease and splenomegaly which I suspect are the causes of her thrombocytopenia.  Additional workup performed to rule out nutritional deficiencies which ultimately showed low ferritin, elevated B12 levels but otherwise unremarkable.  Peripheral smear showed normal blood cell morphology. CBC showed hemoglobin 11.4, white blood cell count 3.2, platelets 51,000 with a normal differential. Given she is now anemic, would recommend starting iron tablets 324/325 mg ferrous sulfate/gluconate depending on tolerance every other day with vitamin C to promote absorption.  If she is unable to tolerate, would recommend IV iron.

## 2024-04-20 ENCOUNTER — Telehealth (HOSPITAL_COMMUNITY): Admitting: Psychiatry

## 2024-04-20 ENCOUNTER — Encounter (HOSPITAL_COMMUNITY): Payer: Self-pay | Admitting: Psychiatry

## 2024-04-20 DIAGNOSIS — R109 Unspecified abdominal pain: Secondary | ICD-10-CM

## 2024-04-20 DIAGNOSIS — F331 Major depressive disorder, recurrent, moderate: Secondary | ICD-10-CM

## 2024-04-20 MED ORDER — ALPRAZOLAM 1 MG PO TABS: 1.0000 mg | ORAL_TABLET | Freq: Four times a day (QID) | ORAL | 2 refills | Status: AC

## 2024-04-20 MED ORDER — TEMAZEPAM 30 MG PO CAPS: 30.0000 mg | ORAL_CAPSULE | Freq: Every day | ORAL | 2 refills | Status: AC

## 2024-04-20 MED ORDER — FLUOXETINE HCL 40 MG PO CAPS: 40.0000 mg | ORAL_CAPSULE | Freq: Every day | ORAL | 3 refills | Status: AC

## 2024-04-20 MED ORDER — ARIPIPRAZOLE 2 MG PO TABS: 2.0000 mg | ORAL_TABLET | Freq: Every day | ORAL | 2 refills | Status: AC

## 2024-04-20 MED ORDER — AMITRIPTYLINE HCL 10 MG PO TABS: 20.0000 mg | ORAL_TABLET | Freq: Every evening | ORAL | 2 refills | Status: AC | PRN

## 2024-04-20 NOTE — Progress Notes (Signed)
 Virtual Visit via Telephone Note  I connected with [name] on 04/20/24 at  1:20 PM EST by telephone and verified that I am speaking with the correct person using two identifiers.  Location: Patient: home Provider: office   I discussed the limitations, risks, security and privacy concerns of performing an evaluation and management service by telephone and the availability of in person appointments. I also discussed with the patient that there may be a patient responsible charge related to this service. The patient expressed understanding and agreed to proceed.     I discussed the assessment and treatment plan with the patient. The patient was provided an opportunity to ask questions and all were answered. The patient agreed with the plan and demonstrated an understanding of the instructions.   The patient was advised to call back or seek an in-person evaluation if the symptoms worsen or if the condition fails to improve as anticipated.  I provided 20 minutes of non-face-to-face time during this encounter.   Barnie Gull, MD  [facility] MD/PA/NP OP Progress Note  04/20/2024 1:33 PM [name]  MRN:  [id number]  Chief Complaint:  Chief Complaint  Patient presents with   Anxiety   Depression   Follow-up   HPI: This patient is a 74 year old widowed white female lives with her daughter and a friend in [place].  She is on disability.  The patient returns for follow-up after about 6 weeks regarding her moderate depression and generalized anxiety disorder.  She states that she has been sick with the flu for about 3 weeks and this is really getting her down.  She has been on and off of prednisone several times.  She is having a lot of trouble sleeping.  I explained that sometimes prednisone can interfere with sleep but she states that it is not always while she is on prednisone.  She does take temazepam  and a small amount of amitriptyline -10 mg.  I suggested we go up a little bit on the  amitriptyline  to 20 mg and she is in agreement.  The patient denies significant depression or anxiety.  She continues to look after her friend who has cancer. Visit Diagnosis:    ICD-10-CM   1. Major depressive disorder, recurrent episode, moderate (HCC)  F33.1     2. Functional abdominal pain syndrome  R10.9 amitriptyline  (ELAVIL ) 10 MG tablet      Past Psychiatric History: none  Past Medical History:  Past Medical History:  Diagnosis Date   Anxiety    Arthritis    Bronchitis    COPD (chronic obstructive pulmonary disease) (HCC)    Depression    Diverticulitis 04/2012   [facility]   Essential hypertension    Frequent PVCs    Outflow tract, positive in the inferior leads   GERD (gastroesophageal reflux disease)    Hepatitis C    Treated   History of pneumonia    Insomnia    Migraine headache    NASH (nonalcoholic steatohepatitis)    Sleep apnea    Splenomegaly    Thyroid  disease    Wears glasses     Past Surgical History:  Procedure Laterality Date   ABDOMINAL HYSTERECTOMY     ANKLE SURGERY Right    ATRIAL FIBRILLATION ABLATION N/A 06/08/2023   Procedure: ATRIAL FIBRILLATION ABLATION;  Surgeon: Nancey Eulas BRAVO, MD;  [facility];  Service: Cardiovascular;  Laterality: N/A;   BIOPSY  10/05/2020   Procedure: BIOPSY;  Surgeon: Eartha Flavors,  Toribio, MD;  Location: [facility];  Service: Gastroenterology;;   CARDIOVERSION N/A 02/06/2023   Procedure: CARDIOVERSION;  Surgeon: Stacia Diannah SQUIBB, MD;  Location: [facility] ORS;  Service: Cardiovascular;  Laterality: N/A;   COLON SURGERY     part of color removed   COLONOSCOPY     COLONOSCOPY WITH PROPOFOL  N/A 11/13/2020   Castaneda:single non bleeding colonic angiodysplastic lesion, diverticulosis in sigmoid, non bleeding external hemorrhoids, no specimens   ESOPHAGEAL BANDING  09/16/2022   Procedure: ESOPHAGEAL BANDING;  Surgeon: Eartha Angelia Toribio, MD;  [facility]: [facility];   Service: Gastroenterology;;   ESOPHAGEAL MANOMETRY N/A 04/10/2014   Procedure: ESOPHAGEAL MANOMETRY (EM);  Surgeon: Lupita FORBES Commander, MD;  Location: [facility] ENDOS[facility];  Service: Endoscopy;  Laterality: N/A;   ESOPHAGOGASTRODUODENOSCOPY     ESOPHAGOGASTRODUODENOSCOPY (EGD) WITH PROPOFOL  N/A 10/05/2020   Castaneda: barrett's esophagus, short segment, 1cm hh, normal stomach, normal duodenum, biopsied, normal, no varices   ESOPHAGOGASTRODUODENOSCOPY (EGD) WITH PROPOFOL  N/A 09/05/2022   Procedure: ESOPHAGOGASTRODUODENOSCOPY (EGD) WITH PROPOFOL ;  Surgeon: Eartha Angelia Toribio, MD;  [facility];  Service: Gastroenterology;  Laterality: N/A;  2:00PM;ASA 3   ESOPHAGOGASTRODUODENOSCOPY (EGD) WITH PROPOFOL  N/A 09/16/2022   Procedure: ESOPHAGOGASTRODUODENOSCOPY (EGD) WITH PROPOFOL ;  Surgeon: Eartha Angelia Toribio, MD;  [facility];  Service: Gastroenterology;  Laterality: N/A;  10:00am;asa 3   INCISIONAL HERNIA REPAIR N/A 10/24/2015   Procedure: LAPAROSCOPIC REPAIR INCISIONAL HERNIA WITH MESH;  Surgeon: Dann Hummer, MD;  Location: [facility] OR;  Service: General;  Laterality: N/A;   INSERTION OF MESH N/A 10/24/2015   Procedure: INSERTION OF MESH;  Surgeon: Dann Hummer, MD;  Location: [facility] OR;  Service: General;  Laterality: N/A;   JOINT REPLACEMENT     KNEE SURGERY Right    x4   PH IMPEDANCE STUDY N/A 04/10/2014   Procedure: PH IMPEDANCE STUDY;  Surgeon: Lupita FORBES Commander, MD;  Location: [facility];  Service: Endoscopy;  Laterality: N/A;   TOTAL SHOULDER ARTHROPLASTY Left 04/22/2018   Procedure: Left total shoulder arthroplasty;  Surgeon: Melita Drivers, MD;  Location: [facility];  Service: Orthopedics;  Laterality: Left;     Family Psychiatric History: See below  Family History:  Family History  Problem Relation Age of Onset   Breast cancer Mother    Heart failure Mother    Alcohol abuse Father    Dementia Father    Depression Brother    Drug abuse Brother    Other Brother     Skin cancer Brother    Alcohol abuse Paternal Uncle    Depression Paternal Uncle    Dementia Maternal Grandmother    Bipolar disorder Daughter    Anxiety disorder Daughter    ADD / ADHD Neg Hx     Social History:  Social History   Socioeconomic History   Marital status: Widowed    Spouse name: Not on file   Number of children: 2   Years of education: 12th grade   Highest education level: Not on file  Occupational History   Occupation: RETIRED  Tobacco Use   Smoking status: Never    Passive exposure: Current   Smokeless tobacco: Never   Tobacco comments:    Never smoked 07/06/23  Vaping Use   Vaping status: Never Used  Substance and Sexual Activity   Alcohol use: Yes    Alcohol/week: 3.0 standard drinks of alcohol    Types: 3 Cans of beer per week    Comment: occasionally   Drug use: No  Sexual activity: Never  Other Topics Concern   Not on file  Social History Narrative   Not on file   Social Drivers of Health   Tobacco Use: Medium Risk (04/20/2024)   Patient History    Smoking Tobacco Use: Never    Smokeless Tobacco Use: Never    Passive Exposure: Current  Financial Resource Strain: Not on file  Food Insecurity: No Food Insecurity (12/25/2023)   Epic    Worried About Programme Researcher, Broadcasting/film/video in the Last Year: Never true    Ran Out of Food in the Last Year: Never true  Transportation Needs: No Transportation Needs (12/25/2023)   Epic    Lack of Transportation (Medical): No    Lack of Transportation (Non-Medical): No  Physical Activity: Not on file  Stress: Not on file  Social Connections: Not on file  Depression (EYV7-0): Low Risk (12/25/2023)   Depression (PHQ2-9)    PHQ-2 Score: 1  Alcohol Screen: Not on file  Housing: Low Risk (12/25/2023)   Epic    Unable to Pay for Housing in the Last Year: No    Number of Times Moved in the Last Year: 0    Homeless in the Last Year: No  Utilities: Not At Risk (12/25/2023)   Epic    Threatened with loss of  utilities: No  Health Literacy: Not on file    Allergies: Allergies[1]  Metabolic Disorder Labs: No results found for: HGBA1C, MPG No results found for: PROLACTIN No results found for: CHOL, TRIG, HDL, CHOLHDL, VLDL, LDLCALC Lab Results  Component Value Date   TSH 3.26 10/17/2019   TSH 4.540 (H) 10/28/2007    Therapeutic Level Labs: No results found for: LITHIUM No results found for: VALPROATE No results found for: CBMZ  Current Medications: Current Outpatient Medications  Medication Sig Dispense Refill   albuterol  (PROVENTIL  HFA;VENTOLIN  HFA) 108 (90 Base) MCG/ACT inhaler Inhale 2 puffs into the lungs every 6 (six) hours as needed for wheezing or shortness of breath.     ALPRAZolam  (XANAX ) 1 MG tablet Take 1 tablet (1 mg total) by mouth 4 (four) times daily. 120 tablet 2   amitriptyline  (ELAVIL ) 10 MG tablet Take 2 tablets (20 mg total) by mouth at bedtime as needed. for sleep 60 tablet 2   apixaban  (ELIQUIS ) 5 MG TABS tablet TAKE ONE TABLET BY MOUTH TWICE DAILY 60 tablet 5   ARIPiprazole  (ABILIFY ) 2 MG tablet Take 1 tablet (2 mg total) by mouth daily. 30 tablet 2   ascorbic acid (VITAMIN C) 500 MG tablet Take 1,000 mg by mouth daily.     cephALEXin (KEFLEX) 500 MG capsule Take 500 mg by mouth 3 (three) times daily.     cyanocobalamin  1000 MCG tablet Take 1,000 mcg by mouth 2 (two) times daily.     ELDERBERRY PO Take 1,000 mg by mouth daily.     FLUoxetine  (PROZAC ) 40 MG capsule Take 1 capsule (40 mg total) by mouth daily. 30 capsule 3   Fluticasone -Umeclidin-Vilant 100-62.5-25 MCG/INH AEPB Inhale 1 puff into the lungs daily.     gabapentin  (NEURONTIN ) 300 MG capsule Take 300-600 mg by mouth See admin instructions. Take 300 mg in the morning and 600 mg at bedtime     lansoprazole  (PREVACID ) 30 MG capsule Take 1 capsule (30 mg total) by mouth 2 (two) times daily before a meal. 60 capsule 1   linaclotide  (LINZESS ) 290 MCG CAPS capsule TAKE ONE CAPSULE BY  MOUTH ONCE DAILY BEFORE BREAKFAST. (Patient taking differently:  Take 290 mcg by mouth daily as needed (constipation).) 90 capsule 0   loratadine  (CLARITIN ) 10 MG tablet Take 1 tablet by mouth daily.     metoCLOPramide  (REGLAN ) 5 MG tablet Take 1 tablet (5 mg total) by mouth every 8 (eight) hours as needed for nausea (headache). 20 tablet 0   metoprolol  succinate (TOPROL -XL) 50 MG 24 hr tablet Take 1 tablet (50 mg total) by mouth daily. 30 tablet 6   Multiple Vitamin (MULTI-VITAMIN PO) Take 1 tablet by mouth daily.     nystatin (MYCOSTATIN) powder Apply 1 g topically daily as needed (rash).  2   polyethylene glycol (MIRALAX ) 17 g packet Take 17 g by mouth in the morning.     Potassium 99 MG TABS Take 99 mg by mouth daily.     predniSONE (DELTASONE) 10 MG tablet Take 10 mg by mouth 2 (two) times daily.     rosuvastatin  (CRESTOR ) 10 MG tablet Take 1 tablet (10 mg total) by mouth daily. 90 tablet 1   Simethicone  (GAS RELIEF PO) Take 2 capsules by mouth as needed (flatulence).      temazepam  (RESTORIL ) 30 MG capsule Take 1 capsule (30 mg total) by mouth at bedtime. 30 capsule 2   traMADol  (ULTRAM ) 50 MG tablet Take 50 mg by mouth 3 (three) times daily as needed for severe pain.     No current facility-administered medications for this visit.     Musculoskeletal: Strength & Muscle Tone: na Gait & Station: na Patient leans: N/A  Psychiatric Specialty Exam: Review of Systems  Psychiatric/Behavioral:  Positive for sleep disturbance.   All other systems reviewed and are negative.   There were no vitals taken for this visit.There is no height or weight on file to calculate BMI.  General Appearance: NA  Eye Contact:  NA  Speech:  Clear and Coherent  Volume:  Normal  Mood:  Euthymic  Affect:  Congruent  Thought Process:  Goal Directed  Orientation:  Full (Time, Place, and Person)  Thought Content: WDL   Suicidal Thoughts:  No  Homicidal Thoughts:  No  Memory:  Immediate;   Good Recent;    Good Remote;   NA  Judgement:  Good  Insight:  Fair  Psychomotor Activity:  Decreased  Concentration:  Concentration: Good and Attention Span: Good  Recall:  Good  Fund of Knowledge: Good  Language: Good  Akathisia:  No  Handed:  Right  AIMS (if indicated): not done  Assets:  Communication Skills Desire for Improvement Resilience Social Support  ADL's:  Intact  Cognition: WNL  Sleep:  Poor   Screenings: Oceanographer Row Office Visit from 12/25/2023 in [facility] [place] [facility] [place] Video Visit from 11/14/2021 in [facility] at [place] Video Visit from 07/29/2021 in [facility] at [place] Video Visit from 05/07/2021 in [facility] at [place] Video Visit from 12/31/2020 in [facility] at [facility] Total Score 1 0 1 1 0   Flowsheet Row Admission (Discharged) from 06/08/2023 in [facility] CARDIAC CATH LAB ED from 03/16/2023 in [facility] at [facility] Admission (Discharged) from 02/06/2023 in [place] [facility]IOPER[facility]  C-SSRS RISK CATEGORY No Risk No Risk No Risk     Assessment and Plan: This patient is a 74 year old female with a history of major depression and generalized anxiety disorder.  She has not been sleeping that well so we will increase amitriptyline  to 20 mg along with the temazepam  30 mg at bedtime for sleep.  She will continue Prozac  40 mg daily for depression, Abilify  2 mg daily for antidepressant augmentation and Xanax  1 mg 4 times daily for anxiety.  She will return to see me in 3 months  Collaboration of Care: Collaboration of Care: Other provider involved in patient's care AEB notes are shared with all specialist in the [facility] system  Patient/Guardian was advised Release of Information must be obtained prior to any record release in order  to collaborate their care with an outside provider. Patient/Guardian was advised if they have not already done so to contact the registration department to sign all necessary forms in order for us  to release information regarding their care.   Consent: Patient/Guardian gives verbal consent for treatment and assignment of benefits for services provided during this visit. Patient/Guardian expressed understanding and agreed to proceed.    Barnie Gull, MD 04/20/2024, 1:33 PM     [1]  Allergies Allergen Reactions   Acetaminophen  Other (See Comments)    Liver function per MD    Levothyroxine Hives and Itching   Nabumetone Rash

## 2024-05-30 ENCOUNTER — Ambulatory Visit: Admitting: Nurse Practitioner

## 2024-06-06 ENCOUNTER — Ambulatory Visit: Admitting: Nurse Practitioner
